# Patient Record
Sex: Male | Born: 1940
Health system: Southern US, Community
[De-identification: ages and names within clinical notes are randomized; demographics above are authoritative.]

## PROBLEM LIST (undated history)

## (undated) ENCOUNTER — Emergency Department (HOSPITAL_COMMUNITY): Discharge status: Absent without leave | Payer: PPO

## (undated) DIAGNOSIS — M2540 Effusion, unspecified joint: Secondary | ICD-10-CM

## (undated) DIAGNOSIS — G510 Bell's palsy: Secondary | ICD-10-CM

## (undated) DIAGNOSIS — R3915 Urgency of urination: Secondary | ICD-10-CM

## (undated) DIAGNOSIS — M2550 Pain in unspecified joint: Secondary | ICD-10-CM

## (undated) DIAGNOSIS — R351 Nocturia: Secondary | ICD-10-CM

## (undated) DIAGNOSIS — Z8709 Personal history of other diseases of the respiratory system: Secondary | ICD-10-CM

## (undated) DIAGNOSIS — K219 Gastro-esophageal reflux disease without esophagitis: Secondary | ICD-10-CM

## (undated) DIAGNOSIS — I739 Peripheral vascular disease, unspecified: Secondary | ICD-10-CM

## (undated) DIAGNOSIS — H269 Unspecified cataract: Secondary | ICD-10-CM

## (undated) DIAGNOSIS — F419 Anxiety disorder, unspecified: Secondary | ICD-10-CM

## (undated) DIAGNOSIS — Z860100 Personal history of colon polyps, unspecified: Secondary | ICD-10-CM

## (undated) DIAGNOSIS — C801 Malignant (primary) neoplasm, unspecified: Secondary | ICD-10-CM

## (undated) DIAGNOSIS — R0902 Hypoxemia: Secondary | ICD-10-CM

## (undated) DIAGNOSIS — I714 Abdominal aortic aneurysm, without rupture, unspecified: Secondary | ICD-10-CM

## (undated) DIAGNOSIS — I509 Heart failure, unspecified: Secondary | ICD-10-CM

## (undated) DIAGNOSIS — M1990 Unspecified osteoarthritis, unspecified site: Secondary | ICD-10-CM

## (undated) DIAGNOSIS — J449 Chronic obstructive pulmonary disease, unspecified: Secondary | ICD-10-CM

## (undated) DIAGNOSIS — R519 Headache, unspecified: Secondary | ICD-10-CM

## (undated) DIAGNOSIS — G4730 Sleep apnea, unspecified: Secondary | ICD-10-CM

## (undated) DIAGNOSIS — I10 Essential (primary) hypertension: Secondary | ICD-10-CM

## (undated) DIAGNOSIS — S76112A Strain of left quadriceps muscle, fascia and tendon, initial encounter: Secondary | ICD-10-CM

## (undated) DIAGNOSIS — Z87898 Personal history of other specified conditions: Secondary | ICD-10-CM

## (undated) DIAGNOSIS — J189 Pneumonia, unspecified organism: Secondary | ICD-10-CM

## (undated) DIAGNOSIS — N400 Enlarged prostate without lower urinary tract symptoms: Secondary | ICD-10-CM

## (undated) DIAGNOSIS — R51 Headache: Secondary | ICD-10-CM

## (undated) DIAGNOSIS — Z8719 Personal history of other diseases of the digestive system: Secondary | ICD-10-CM

## (undated) DIAGNOSIS — I499 Cardiac arrhythmia, unspecified: Secondary | ICD-10-CM

## (undated) DIAGNOSIS — G2581 Restless legs syndrome: Secondary | ICD-10-CM

## (undated) DIAGNOSIS — Z9981 Dependence on supplemental oxygen: Secondary | ICD-10-CM

## (undated) DIAGNOSIS — I480 Paroxysmal atrial fibrillation: Secondary | ICD-10-CM

## (undated) DIAGNOSIS — IMO0001 Reserved for inherently not codable concepts without codable children: Secondary | ICD-10-CM

## (undated) DIAGNOSIS — K5792 Diverticulitis of intestine, part unspecified, without perforation or abscess without bleeding: Secondary | ICD-10-CM

## (undated) HISTORY — DX: Bell's palsy: G51.0

## (undated) HISTORY — DX: Unspecified osteoarthritis, unspecified site: M19.90

## (undated) HISTORY — PX: ESOPHAGOGASTRODUODENOSCOPY: SHX1529

## (undated) HISTORY — PX: BICEPS TENDON REPAIR: SHX566

## (undated) HISTORY — PX: OTHER SURGICAL HISTORY: SHX169

## (undated) HISTORY — PX: FOOT SURGERY: SHX648

## (undated) HISTORY — PX: HERNIA REPAIR: SHX51

## (undated) HISTORY — PX: COLONOSCOPY: SHX174

## (undated) HISTORY — DX: Essential (primary) hypertension: I10

## (undated) HISTORY — DX: Abdominal aortic aneurysm, without rupture: I71.4

## (undated) HISTORY — PX: TONSILLECTOMY: SUR1361

## (undated) HISTORY — PX: CYSTOSCOPY: SUR368

## (undated) HISTORY — PX: ROTATOR CUFF REPAIR: SHX139

## (undated) HISTORY — DX: Diverticulitis of intestine, part unspecified, without perforation or abscess without bleeding: K57.92

## (undated) HISTORY — PX: HAND SURGERY: SHX662

## (undated) HISTORY — DX: Abdominal aortic aneurysm, without rupture, unspecified: I71.40

## (undated) HISTORY — DX: Gastro-esophageal reflux disease without esophagitis: K21.9

---

## 1999-02-20 ENCOUNTER — Ambulatory Visit (HOSPITAL_BASED_OUTPATIENT_CLINIC_OR_DEPARTMENT_OTHER): Admission: RE | Admit: 1999-02-20 | Discharge: 1999-02-20 | Payer: Self-pay | Admitting: Orthopedic Surgery

## 1999-05-13 ENCOUNTER — Ambulatory Visit (HOSPITAL_BASED_OUTPATIENT_CLINIC_OR_DEPARTMENT_OTHER): Admission: RE | Admit: 1999-05-13 | Discharge: 1999-05-13 | Payer: Self-pay | Admitting: Orthopedic Surgery

## 1999-07-15 ENCOUNTER — Ambulatory Visit (HOSPITAL_BASED_OUTPATIENT_CLINIC_OR_DEPARTMENT_OTHER): Admission: RE | Admit: 1999-07-15 | Discharge: 1999-07-15 | Payer: Self-pay | Admitting: Orthopedic Surgery

## 2001-03-24 HISTORY — PX: COLONOSCOPY: SHX174

## 2002-02-24 ENCOUNTER — Ambulatory Visit (HOSPITAL_COMMUNITY): Admission: RE | Admit: 2002-02-24 | Discharge: 2002-02-24 | Payer: Self-pay | Admitting: Internal Medicine

## 2003-05-29 ENCOUNTER — Ambulatory Visit: Admission: RE | Admit: 2003-05-29 | Discharge: 2003-05-29 | Payer: Self-pay | Admitting: Internal Medicine

## 2003-06-01 ENCOUNTER — Ambulatory Visit (HOSPITAL_COMMUNITY): Admission: RE | Admit: 2003-06-01 | Discharge: 2003-06-01 | Payer: Self-pay | Admitting: Internal Medicine

## 2003-06-09 ENCOUNTER — Ambulatory Visit (HOSPITAL_COMMUNITY): Admission: RE | Admit: 2003-06-09 | Discharge: 2003-06-09 | Payer: Self-pay | Admitting: General Surgery

## 2003-09-05 ENCOUNTER — Ambulatory Visit (HOSPITAL_COMMUNITY): Admission: RE | Admit: 2003-09-05 | Discharge: 2003-09-05 | Payer: Self-pay | Admitting: Internal Medicine

## 2006-12-10 ENCOUNTER — Ambulatory Visit: Payer: Self-pay | Admitting: *Deleted

## 2007-12-16 ENCOUNTER — Ambulatory Visit: Payer: Self-pay | Admitting: *Deleted

## 2008-06-29 ENCOUNTER — Inpatient Hospital Stay (HOSPITAL_COMMUNITY): Admission: EM | Admit: 2008-06-29 | Discharge: 2008-06-30 | Payer: Self-pay | Admitting: Emergency Medicine

## 2008-06-29 ENCOUNTER — Encounter (INDEPENDENT_AMBULATORY_CARE_PROVIDER_SITE_OTHER): Payer: Self-pay | Admitting: Cardiology

## 2008-06-29 ENCOUNTER — Ambulatory Visit: Payer: Self-pay | Admitting: Surgery

## 2008-09-21 ENCOUNTER — Ambulatory Visit (HOSPITAL_COMMUNITY): Admission: RE | Admit: 2008-09-21 | Discharge: 2008-09-21 | Payer: Self-pay | Admitting: Internal Medicine

## 2008-10-06 ENCOUNTER — Encounter (INDEPENDENT_AMBULATORY_CARE_PROVIDER_SITE_OTHER): Payer: Self-pay | Admitting: Urology

## 2008-10-06 ENCOUNTER — Ambulatory Visit (HOSPITAL_BASED_OUTPATIENT_CLINIC_OR_DEPARTMENT_OTHER): Admission: RE | Admit: 2008-10-06 | Discharge: 2008-10-06 | Payer: Self-pay | Admitting: Urology

## 2008-10-06 HISTORY — PX: CHOLECYSTECTOMY: SHX55

## 2009-01-15 ENCOUNTER — Ambulatory Visit: Payer: Self-pay | Admitting: Surgery

## 2009-02-26 DIAGNOSIS — Z8719 Personal history of other diseases of the digestive system: Secondary | ICD-10-CM | POA: Insufficient documentation

## 2009-02-26 DIAGNOSIS — I10 Essential (primary) hypertension: Secondary | ICD-10-CM | POA: Insufficient documentation

## 2009-02-27 ENCOUNTER — Ambulatory Visit: Payer: Self-pay | Admitting: Internal Medicine

## 2009-02-28 ENCOUNTER — Encounter: Payer: Self-pay | Admitting: Internal Medicine

## 2009-03-24 HISTORY — PX: COLONOSCOPY: SHX174

## 2009-03-26 ENCOUNTER — Ambulatory Visit (HOSPITAL_COMMUNITY): Admission: RE | Admit: 2009-03-26 | Discharge: 2009-03-26 | Payer: Self-pay | Admitting: Internal Medicine

## 2009-03-26 ENCOUNTER — Ambulatory Visit: Payer: Self-pay | Admitting: Internal Medicine

## 2009-05-31 ENCOUNTER — Encounter: Admission: RE | Admit: 2009-05-31 | Discharge: 2009-05-31 | Payer: Self-pay | Admitting: Orthopedic Surgery

## 2009-09-26 ENCOUNTER — Inpatient Hospital Stay (HOSPITAL_COMMUNITY): Admission: RE | Admit: 2009-09-26 | Discharge: 2009-09-29 | Payer: Self-pay | Admitting: Internal Medicine

## 2009-10-05 ENCOUNTER — Encounter (INDEPENDENT_AMBULATORY_CARE_PROVIDER_SITE_OTHER): Payer: Self-pay

## 2009-10-10 ENCOUNTER — Ambulatory Visit (HOSPITAL_COMMUNITY): Admission: RE | Admit: 2009-10-10 | Discharge: 2009-10-10 | Payer: Self-pay | Admitting: Internal Medicine

## 2010-02-20 ENCOUNTER — Ambulatory Visit (HOSPITAL_COMMUNITY): Admission: RE | Admit: 2010-02-20 | Discharge: 2010-02-20 | Payer: Self-pay | Admitting: Internal Medicine

## 2010-03-14 ENCOUNTER — Ambulatory Visit: Payer: Self-pay | Admitting: Vascular Surgery

## 2010-04-25 NOTE — Letter (Signed)
Summary: Recall Office Visit  [facility]  [address]   [place], [place] [address]   Phone: [phone]  Fax: [phone]      October 05, 2009   CARLISS QUAST [address] [place], [place]  [address] 20-Jan-1941   Dear Mr. Lawhead,   According to our records, it is time for you to schedule a follow-up office visit with [place].   At your convenience, please call [phone] to schedule an office visit. If you have any questions, concerns, or feel that this letter is in error, we would appreciate your call.   Sincerely,    Hendricks Limes LPN  [facility] Ph: [phone]   Fax: [phone]

## 2010-06-09 LAB — CBC
MCHC: 33.7 g/dL (ref 30.0–36.0)
RDW: 15.3 % (ref 11.5–15.5)

## 2010-06-09 LAB — DIFFERENTIAL
Eosinophils Absolute: 0.3 10*3/uL (ref 0.0–0.7)
Eosinophils Relative: 3 % (ref 0–5)
Lymphs Abs: 2.3 10*3/uL (ref 0.7–4.0)
Monocytes Absolute: 0.9 10*3/uL (ref 0.1–1.0)
Monocytes Relative: 9 % (ref 3–12)
Neutro Abs: 7.1 10*3/uL (ref 1.7–7.7)
Neutrophils Relative %: 67 % (ref 43–77)

## 2010-06-30 LAB — URINE MICROSCOPIC-ADD ON

## 2010-06-30 LAB — COMPREHENSIVE METABOLIC PANEL
ALT: 20 U/L (ref 0–53)
BUN: 12 mg/dL (ref 6–23)
CO2: 24 mEq/L (ref 19–32)
Calcium: 9.5 mg/dL (ref 8.4–10.5)
Creatinine, Ser: 1.03 mg/dL (ref 0.4–1.5)
GFR calc non Af Amer: >60 mL/min (ref >60)
Glucose, Bld: 115 mg/dL — ABNORMAL HIGH (ref 70–99)
Total Protein: 6.5 g/dL (ref 6.0–8.3)

## 2010-06-30 LAB — URINALYSIS, ROUTINE W REFLEX MICROSCOPIC
Glucose, UA: NEGATIVE mg/dL
Ketones, ur: NEGATIVE mg/dL
Leukocytes, UA: NEGATIVE
Nitrite: NEGATIVE
Protein, ur: NEGATIVE mg/dL
Urobilinogen, UA: 0.2 mg/dL (ref 0.0–1.0)

## 2010-06-30 LAB — CBC
HCT: 50.7 % (ref 39.0–52.0)
Hemoglobin: 17 g/dL (ref 13.0–17.0)
MCHC: 33.6 g/dL (ref 30.0–36.0)
MCV: 89.4 fL (ref 78.0–100.0)
RBC: 5.67 MIL/uL (ref 4.22–5.81)
RDW: 14.8 % (ref 11.5–15.5)

## 2010-06-30 LAB — PROTIME-INR
INR: 0.9 (ref 0.00–1.49)
Prothrombin Time: 12.2 seconds (ref 11.6–15.2)

## 2010-07-03 LAB — COMPREHENSIVE METABOLIC PANEL
ALT: 34 U/L (ref 0–53)
AST: 35 U/L (ref 0–37)
Albumin: 3.8 g/dL (ref 3.5–5.2)
CO2: 27 mEq/L (ref 19–32)
Calcium: 9.8 mg/dL (ref 8.4–10.5)
Chloride: 109 mEq/L (ref 96–112)
Creatinine, Ser: 1.17 mg/dL (ref 0.4–1.5)
GFR calc Af Amer: >60 mL/min (ref >60)
Sodium: 144 mEq/L (ref 135–145)
Total Bilirubin: 0.7 mg/dL (ref 0.3–1.2)

## 2010-07-03 LAB — POCT CARDIAC MARKERS: Troponin i, poc: <0.05 ng/mL (ref 0.00–0.09)

## 2010-07-03 LAB — URINALYSIS, ROUTINE W REFLEX MICROSCOPIC
Bilirubin Urine: NEGATIVE
Leukocytes, UA: NEGATIVE
Nitrite: NEGATIVE
Specific Gravity, Urine: 1.022 (ref 1.005–1.030)
pH: 5.5 (ref 5.0–8.0)

## 2010-07-03 LAB — TROPONIN I: Troponin I: <0.01 ng/mL (ref 0.00–0.06)

## 2010-07-03 LAB — BASIC METABOLIC PANEL
BUN: 13 mg/dL (ref 6–23)
CO2: 25 mEq/L (ref 19–32)
Chloride: 114 mEq/L — ABNORMAL HIGH (ref 96–112)
Creatinine, Ser: 1.06 mg/dL (ref 0.4–1.5)
GFR calc Af Amer: >60 mL/min (ref >60)
Glucose, Bld: 130 mg/dL — ABNORMAL HIGH (ref 70–99)

## 2010-07-03 LAB — CBC
MCHC: 34.1 g/dL (ref 30.0–36.0)
MCV: 88.9 fL (ref 78.0–100.0)
MCV: 89.6 fL (ref 78.0–100.0)
Platelets: 259 10*3/uL (ref 150–400)
RBC: 4.91 MIL/uL (ref 4.22–5.81)
RBC: 5.54 MIL/uL (ref 4.22–5.81)
RDW: 15.3 % (ref 11.5–15.5)
WBC: 16.8 10*3/uL — ABNORMAL HIGH (ref 4.0–10.5)

## 2010-07-03 LAB — CK TOTAL AND CKMB (NOT AT ARMC)
CK, MB: 4.4 ng/mL — ABNORMAL HIGH (ref 0.3–4.0)
Relative Index: 1.1 (ref 0.0–2.5)
Total CK: 409 U/L — ABNORMAL HIGH (ref 7–232)

## 2010-07-03 LAB — DIFFERENTIAL
Eosinophils Absolute: 0 10*3/uL (ref 0.0–0.7)
Eosinophils Relative: 0 % (ref 0–5)
Lymphocytes Relative: 12 % (ref 12–46)
Lymphs Abs: 1.9 10*3/uL (ref 0.7–4.0)
Monocytes Absolute: 0.8 10*3/uL (ref 0.1–1.0)

## 2010-07-03 LAB — URINE CULTURE

## 2010-07-03 LAB — LIPID PANEL
Cholesterol: 153 mg/dL (ref 0–200)
LDL Cholesterol: 99 mg/dL (ref 0–99)
Triglycerides: 123 mg/dL (ref <150)

## 2010-07-03 LAB — TSH: TSH: 0.727 u[IU]/mL (ref 0.350–4.500)

## 2010-07-03 LAB — CARDIAC PANEL(CRET KIN+CKTOT+MB+TROPI)
CK, MB: 3.2 ng/mL (ref 0.3–4.0)
Relative Index: 0.7 (ref 0.0–2.5)

## 2010-07-03 LAB — URINE MICROSCOPIC-ADD ON

## 2010-07-03 LAB — CALCIUM: Calcium: 9.2 mg/dL (ref 8.4–10.5)

## 2010-07-03 LAB — D-DIMER, QUANTITATIVE: D-Dimer, Quant: 7.37 ug/mL-FEU — ABNORMAL HIGH (ref 0.00–0.48)

## 2010-08-06 NOTE — Procedures (Signed)
DUPLEX ULTRASOUND OF ABDOMINAL AORTA   INDICATION:  Followup from abdominal aortic aneurysm.   HISTORY:  Diabetes:  No  Cardiac:  No  Hypertension:  Yes  Smoking:  Yes  Family History:  Father  AAA  Previous Surgery:  No   DUPLEX EXAM:         AP (cm)                   TRANSVERSE (cm)  Proximal             2.2 cm                    2.4 cm  Mid                  2.3 cm                    2.4 cm  Distal               1.3 cm                    1.9 cm  Right Iliac          1.2 cm                    1.9 cm  Left Iliac           1.3 cm                    1.4 cm   PREVIOUS:  Date:  12/16/2007  AP:  2.9  TRANSVERSE:  2.3   IMPRESSION:  Abdominal aortic aneurysm, largest measurement of 2.3 x  2.4.   ___________________________________________  V. Charlena Cross, MD   CJ/MEDQ  D:  01/15/2009  T:  01/15/2009  Job:  [id number]

## 2010-08-06 NOTE — Procedures (Signed)
DUPLEX ULTRASOUND OF ABDOMINAL AORTA   INDICATION:  Followup abdominal aortic aneurysm.   HISTORY:  Diabetes:  No.  Cardiac:  No.  Hypertension:  Yes.  Smoking:  Yes.  Connective Tissue Disorder:  Family History:  Father had AAA.  Previous Surgery:  No.   DUPLEX EXAM:         AP (cm)                   TRANSVERSE (cm)  Proximal             2.9 cm                    2.9 cm  Mid                  2.9 cm                    3.0 cm  Distal               2.0 cm                    2.1 cm  Right Iliac          1.2 cm                    1.2 cm  Left Iliac           1.2 cm                    1.1 cm   PREVIOUS:  Date:  12/10/2006  AP:  2.38  TRANSVERSE:  2.3   IMPRESSION:  1. Aneurysm of the mid abdominal aorta with evidence of a possible      dissection noted.  2. Mildly increased diameter measurements noted when compared to the      previous exam of 12/10/2006, however, the aneurysm remains minimal      in size.  Otherwise there is no significant change noted from the      previous exam.   ___________________________________________  P. Liliane Bade, M.D.   CH/MEDQ  D:  12/16/2007  T:  12/16/2007  Job:  [id number]

## 2010-08-06 NOTE — Op Note (Signed)
Derrick Moon, Derrick Moon                ACCOUNT NO.:  [id number]   MEDICAL RECORD NO.:  [id number]          PATIENT TYPE:  AMB   LOCATION:  [facility]                         FACILITY:  [facility]   PHYSICIAN:  Ronald L. Earlene Plater, M.D.  DATE OF BIRTH:  [birth year]   DATE OF PROCEDURE:  10/06/2008  DATE OF DISCHARGE:                               OPERATIVE REPORT   PREOPERATIVE DIAGNOSIS:  Bladder tumor.   POSTOPERATIVE DIAGNOSIS:  Bladder tumor.   PROCEDURE PERFORMED:  1. Cystourethroscopy.  2. Transurethral resection of bladder tumor (medium).   SURGEON:  Lucrezia Starch. Earlene Plater, M.D.   RESIDENTRonaldo Miyamoto A. Peggye Pitt, M.D.   ANESTHESIA:  General endotracheal anesthesia.   COMPLICATIONS:  None.   SPECIMENS:  Bladder tumor sent to pathology.   FINDINGS:  1. The patient had a papillary bladder tumor on the left lateral      bladder wall approximately 2 to 3 cm in size.  2. The patient had a high bladder neck with prominent median lobe.   INDICATIONS FOR PROCEDURE:  Mr. Derrick Moon is a 70 year old Caucasian male  who developed gross painless hematuria.  Of note, he is a smoker and  smokes cigars.  He was evaluated with CT scan and office cystoscopy  which revealed a papillary bladder tumor.  He now presents to the  operating room for definitive management and understands all the risk as  informed consent has been obtained.   DESCRIPTION OF PROCEDURE IN DETAIL:  Mr. Derrick Moon was brought back to the  operating room and correctly identified via his wristband.  Preoperative  time out was called to confirm correct patient, procedure and site.  After successful induction of general endotracheal anesthesia, he was  positioned in the dorsal lithotomy position and to reduce pressure on  bony prominences.  IV antibiotics were administered.  His perineum was  prepped and draped in usual sterile fashion.  Surgeon was sterilely  gowned and gloved.   A 22-French cystoscopic sheath was used to insert a 12-degree lens  transurethrally into the bladder, and pan cystoscopy was performed.  The  anterior urethra was normal in appearance.  The posterior urethra showed  evidence of a prominent median lobe and a high-riding bladder neck.  Cystoscopy was performed which revealed normal shape, size and capacity  of the bladder.  There was no evidence of any diverticula.  There was 1+  trabeculation of the bladder.  Bilateral ureteral orifices were noted in  their usual anatomical position and seen to be effluxing clear yellow  urine.  There was some stony debris present within the bladder, and a 2-  3 cm papillary bladder tumor was identified in the left lateral bladder  wall.  Cystoscopy was performed with a 70-degree lens, and there were no  other lesions identified.   Resectoscope was inserted using the obturator.  We then systematically  resected a bladder tumor using continuous flow resectoscope.  The  patient was paralyzed for this portion procedure to prevent possible  obturator reflex.  The tumor was resected down to the muscularis  propria.  Base of the tumor was  then fulgurated as were the wound edges.  It was excellently hemostatic at the completion.  The bladder tumor  chips were then retrieved and sent to permanent pathology.   The patient's bladder was then emptied, and resectoscope was removed.  A  22-French Foley catheter was attempted to be inserted transurethrally.  However, resistance was met at the posterior urethra.  This was  attempted using a catheter guide.  However, this was also unsuccessful.  Therefore, we elected to place the catheter cystoscopically.  Cystoscope  was then reintroduced, and there was no trauma to the anterior or  posterior urethra.  The difficulty was likely related to high bladder  neck.  With the scope in place, a wire was inserted through the working  port up into the bladder.  The wire held in place and the scope was  removed.  A 20-French Council tip catheter  was then advanced over the  Glidewire with return of clear yellow urine.  The balloon was inflated  with 10 mL of sterile water.  It was connected to a drainage bag, and  the procedure was terminated.  The patient tolerated the procedure well  without any undue side effects.   Dr. Earlene Plater was present and available and participated in all aspects of  this patient's care.   DISPOSITION:  The patient tolerated the procedure well and was extubated  and transported back in stable condition.     ______________________________  Edward Qualia, M.D.      Ronald L. Earlene Plater, M.D.  Electronically Signed    KR/MEDQ  D:  10/06/2008  T:  10/07/2008  Job:  [id number]

## 2010-08-06 NOTE — Procedures (Signed)
DUPLEX ULTRASOUND OF ABDOMINAL AORTA   INDICATION:  Follow-up abdominal aortic aneurysm.   HISTORY:  Diabetes:  No.  Cardiac:  No.  Hypertension:  Yes.  Smoking:  Yes.  Connective Tissue Disorder:  Family History:  Yes, father.  Previous Surgery:  No.   DUPLEX EXAM:         AP (cm)                   TRANSVERSE (cm)  Proximal             1.60 Cm                   1.75 cm  Mid                  2.38 cm                   2.33 cm  Distal               1.64 cm                   1.85 cm  Right Iliac          0.83 cm                   0.80 cm  Left Iliac           0.83 cm                   0.8 cm   PREVIOUS:  Date: 11/06/05  AP:  2.3  TRANSVERSE:  2.3   IMPRESSION:  Abdominal aortic aneurysm appears to have stable  measurements, however, evidence of possible dissection.   ___________________________________________  P. Liliane Bade, M.D.   AS/MEDQ  D:  12/10/2006  T:  12/10/2006  Job:  [id number]
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
## 2010-08-06 NOTE — Procedures (Signed)
DUPLEX ULTRASOUND OF ABDOMINAL AORTA   INDICATION:  Follow up abdominal aortic aneurysm.   HISTORY:  Diabetes:  No.  Cardiac:  No.  Hypertension:  Yes.  Smoking:  Yes.  Connective Tissue Disorder:  Family History:  Father had a AAA.  Previous Surgery:  No.   DUPLEX EXAM:         AP (cm)                   TRANSVERSE (cm)  Proximal             1.8 cm                    2.1 cm  Mid                  2.4 cm                    2.8 cm  Distal               1.6 cm                    1.9 cm  Right Iliac          1.1 cm                    1.4 cm  Left Iliac           1.0 cm                    1.5 cm   PREVIOUS:  Date:  AP:  2.3  TRANSVERSE:  2.4   IMPRESSION:  An ectatic abdominal aortic aneurysm within the mid distal  aorta.  The largest measurement today of 2.8 X 2.4 cm.  Correlates to  exams previously.  Echogenic foci within the mid aorta, measuring 1.5  cm, suggestive of atherosclerosis versus intimal flap with a possible  dissection, as discussed in previous examinations.  A technically  difficult study due to overlying bowel gas and patient body habitus.  The study does appear stable compared to previous.   ___________________________________________  Janetta Hora Fields, MD   OD/MEDQ  D:  03/14/2010  T:  03/14/2010  Job:  [id number]

## 2010-08-06 NOTE — Assessment & Plan Note (Signed)
OFFICE VISIT   Derrick Moon, Derrick Moon  DOB:  09-28-1940                                       03/14/2010  [id number]#:[id number]   The patient is a 70 year old male who we have been following for small  abdominal aortic aneurysm.  He was originally seen by Dr. Madilyn Fireman in  August of 2005.  At that time his aneurysm was 3.2 x 2.8 cm in diameter.  He had a repeat ultrasound today which shows that the aneurysm is 2.8 x  2.4 cm in diameter.  This represents essentially no growth over 6 years.  The patient denies any abdominal or back pain.  His father did have a  family history of aneurysm.   CHRONIC MEDICAL PROBLEMS:  Include hypertension which is currently  controlled and followed by Dr. Ouida Sills.  He recently had an operation on  his right foot by Dr. Lestine Box.   SOCIAL HISTORY:  He is married, has four children.  He is retired.  He  smokes 2 cigars per day.   FAMILY HISTORY:  As mentioned above.   REVIEW OF SYSTEMS:  He has a history of hiatal hernia and urinary  frequency.  NEUROLOGIC:  He has occasional dizziness and headaches.  EYES:  He has had some decline in his eyesight recently.  MUSCULOSKELETAL:  He has joint and arthritis pain.  All other systems were negative.   PHYSICAL EXAM:  Blood pressure is 124/74 in the left arm, heart rate 61  and regular.  Temperature is 98.  HEENT:  Unremarkable.  Neck:  Has 2+  carotid pulses without bruit.  Chest:  Clear to auscultation.  Cardiac:  Regular rate and rhythm without murmur.  Abdomen:  Obese, soft,  nontender, nondistended, no palpable mass.  Extremities:  He has 2+  femoral pulses bilaterally.  He has a cast extending from below the knee  on the right side down surrounding the entire right foot.  He has 2+  dorsalis pedis and posterior tibial pulse in the left foot, it is pink,  warm and well-perfused.  Skin has no open ulcers or rashes otherwise.   In summary, the patient had an ultrasound today which shows  maximal  aortic diameter of 2.8 cm in diameter.  This is unchanged over the last  6 years.  I believe it would be okay to space his ultrasounds out at  this point.  He will follow up with a AAA ultrasound in 2 years' time.  If it again is unchanged at that time we may need to consider whether or  not to space his followup out further.     Janetta Hora. Fields, MD  Electronically Signed   CEF/MEDQ  D:  03/14/2010  T:  03/15/2010  Job:  [id number]   cc:   Kingsley Callander. Ouida Sills, MD

## 2010-08-06 NOTE — Procedures (Signed)
EEG NUMBER:  [id number]   CLINICAL HISTORY:  A 70 year old man with episode of syncope versus  seizure versus pathologic sleepiness.  EEG is for evaluation.   DESCRIPTION:  Background waking rhythm in this tracing is seen only very  brief fragments and is a very low amplitude.  Alpha rhythm of  approximately 10 Hz, which appears in posterior regions.  The record  consists almost entirely of low-amplitude fast activity of 15-25 Hz,  which appears diffusely without abnormal asymmetry.  No focal slowing is  noted and no epileptiform discharge is seen.  The patient remained in  awake state throughout the recording.  Photic stimulation and  hyperventilation were both performed, and neither produced significant  change in the background rhythms, although a little bit of increased  prominence in the alpha was seen in hyperventilation.  Some mild driving  responses and photomyoclonic responses were seen with photic  stimulation.  Single-channel devoted EKG revealed sinus rhythm  throughout at a rate of approximately 72 beats per minute.   CONCLUSION:  Essentially normal study in awake state.  Excessive low-  amplitude beta activity as seen in the study is a common finding in  anxious individuals and in those taking medications of the  sedative/hypnotic class.  It may be useful to repeat EEG off medications  or when the patient is less anxious if felt to be clinically indicated.  However, no focal slowing is noted and no epileptiform discharge is  seen.      Michael L. Thad Ranger, M.D.  Electronically Signed     ZOX:WRUE  D:  06/30/2008 17:03:56  T:  07/01/2008 07:39:26  Job #:  [id number]   cc:   [facility]
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
## 2010-08-09 NOTE — Op Note (Signed)
[place]. [facility]  Patient:    Derrick Moon, Derrick Moon                         MRN: [id number] Proc. Date: 05/13/99 Attending:  Alinda Deem, M.D.                           Operative Report  PREOPERATIVE DIAGNOSIS:  Right shoulder impingement syndrome, possible rotator uff tear.  POSTOPERATIVE DIAGNOSIS:  Right shoulder impingement with passive rotator cuff tear.  PROCEDURE:  Right shoulder arthroscopic anteroinferior acromioplasty, debridement of massive rotator cuff tear that went all the way back to the musculotendinous  junction of the supraspinatus, and greater tuberoplasty.  SURGEON:  Alinda Deem, M.D.  FIRST ASSISTANT:  None.  ANESTHESIA:  General endotracheal anesthesia.  ESTIMATED BLOOD LOSS:  Minimal.  FLUID REPLACEMENT:  1200 cc of crystalloid.  DRAINS PLACED:  None.  TOURNIQUET TIME:  None.  INDICATIONS FOR PROCEDURE:  [age]-year-old man, followed for a right shoulder impingement syndrome for over a year, who has failed conservative treatment with anti-inflammatory medicines, Theraband exercises and only got temporary relief rom cortisone injections.  He does have some rotator cuff weakness and may in fact ave a rotator cuff tear, but at age 75 and a smoker, if there is a massive tear as there would be in a younger person, no repair will be attempted.  We will simply debride him, accept weakness in exchange for pain relief; he is already weak.  DESCRIPTION OF PROCEDURE:  The patient was identified by arm band and taken to he operating room at [facility], appropriate anesthetic monitors were  attached and general endotracheal anesthesia induced, with the patient in a supine position.  He was then placed in a beach chair position and the right upper extremity prepped and draped in the usual sterile fashion from the wrist to the  hemithorax.  The skin along the anterolateral and posterior aspects of  the acromion process was infiltrated with 2 to 3 cc of 0.5% Marcaine and epinephrine solution and using a #11 blade, standard anterolateral and posterior portals were then made 1.5 cm from the anterior aspect of the [facility] joint, lateral to the junction of the  middle and posterior thirds of the acromion and posterior to the posterolateral  corner of the acromion process.  The inflow was introduced anteriorly, the arthroscope laterally and a 4.2-mm ______  sucker-shaver posteriorly.  We immediately identified a massive rotator cuff tear all the way to the musculotendinous junction of the supraspinatus tendon that looked to be old and  chronic and this was debrided back to a stable margin and an accessory anterolateral portal was made and it could not be reduced anywhere near the greater tuberosity and therefore no repair was attempted; we simply smoothed off the edges and smoothed off the greater tuberosity as well, removing bits of torn tendon from that as well.  The subacromial spur was then removed a 4.5-mm hooded vortex bur. The [facility] joint was noted to be intact and this was not disrupted.  We removed subacromial bursa that was inflamed, took the shoulder through a range of motion and noticed good condition of the articular cartilage, the subscapularis tendon, the biceps tendon and the infraspinatus tendons.  Satisfied with our decompression and debridement, the arthroscopic instruments were removed and a dressing of Xeroform, 4 x 8 dressing sponges and Hypafix tape were applied.  The patient was then awakened and taken to the recovery room without difficulty. DD:  05/13/99 TD:  05/13/99 Job: [id number]

## 2010-08-09 NOTE — Op Note (Signed)
NAMEGRANTHAM, Derrick Moon                          ACCOUNT NO.:  [id number]   MEDICAL RECORD NO.:  [id number]                   PATIENT TYPE:  AMB   LOCATION:  DAY                                  FACILITY:  [facility]   PHYSICIAN:  Dalia Heading, M.D.               DATE OF BIRTH:  1940/04/29   DATE OF PROCEDURE:  06/09/2003  DATE OF DISCHARGE:                                 OPERATIVE REPORT   PREOPERATIVE DIAGNOSIS:  Biliary colic.   POSTOPERATIVE DIAGNOSIS:  Biliary colic, chronic cholecystitis.   PROCEDURE:  Laparoscopic cholecystectomy.   SURGEON:  Dalia Heading, M.D.   ASSISTANT:  Bernerd Limbo. Leona Carry, M.D.   ANESTHESIA:  General endotracheal.   INDICATIONS FOR PROCEDURE:  The patient is a 70 year old white male who  presents with biliary colic secondary to chronic cholecystitis.  The risks  and benefits of the procedure, including bleeding, infection, hepatobiliary  injury, and the possibility of an open procedure were fully explained to the  patient who gave informed consent.   DESCRIPTION OF PROCEDURE:  The patient was placed in the supine position.  After induction of general endotracheal anesthesia, the abdomen was prepped  and draped using the usual sterile technique with Betadine.  Surgical site  confirmation was performed.   An infraumbilical incision was made down to the fascia.  A Veress needle was  introduced into the abdominal cavity, and confirmation of placement was done  using the saline drop test.  The abdomen was then insufflated to 16 mmHg  pressure.  An 11-mm trocar was introduced into the abdominal cavity under  direct visualization without difficulty.  The patient was placed in reverse  Trendelenburg position, and an additional 11-mm trocar was placed in the  epigastric region and 5-mm trocars were placed in the right upper quadrant  and right flank regions.  The liver was inspected and noted to be for the  most part within normal limits.  A mild fatty  liver was found.  The  gallbladder was retracted superiorly and laterally.  The dissection was  begun around the infundibulum of the gallbladder.  The cystic duct was first  identified.  It was noted to have a high insertion proximal to the  infundibulum.  This was causing a kinking of the neck of the gallbladder.  The cystic duct was ligated and divided.  This was likewise done on the  cystic artery.  The gallbladder was then freed away from the gallbladder  fossa using Bovie electrocautery.  The gallbladder was delivered through the  epigastric trocar site without difficulty.   The gallbladder fossa was inspected, and no abnormal bleeding or bile  leakage was noted.  Surgicel was placed in the gallbladder fossa.  All fluid  and air were then evacuated from the abdominal cavity prior to removal of  the trocars.   All wounds were irrigated with normal saline.  All wounds were  injected with  0.5% Sensorcaine.  The infraumbilical fascia was reapproximated using an 0  Vicryl interrupted suture.  All skin incisions were closed using staples.  Betadine ointment and dry sterile dressings were applied.   All tape and needle counts were correct at the end of the procedure.  The  patient was extubated in the operating room and went back to the recovery  room awake and in stable condition.   COMPLICATIONS:  None.   SPECIMENS:  Gallbladder.   ESTIMATED BLOOD LOSS:  Minimal.      ___________________________________________                                            Dalia Heading, M.D.   MAJ/MEDQ  D:  06/09/2003  T:  06/09/2003  Job:  [id number]   cc:   Kingsley Callander. Ouida Sills, M.D.  [address]  [place]  [place] [address]  Fax: [phone]

## 2010-08-09 NOTE — Op Note (Signed)
[place]. [facility]  Patient:    Derrick Moon                        MRN: [id number] Proc. Date: 02/20/99 Adm. Date:  [id number] Attending:  Alinda Deem                           Operative Report  PREOPERATIVE DIAGNOSIS:  Loose right great toe total joint implant.  POSTOPERATIVE DIAGNOSIS:  Loose right great toe total joint implant.  PROCEDURE:  Removal of right great toe implant from the metatarsal head and the  proximal phalanx, followed by arthrodesis using autograft bone chips mixed with  allograft bone chips, fixation accomplished with crossed 3.5 mm cortical lag screws.  SURGEON:  Alinda Deem, M.D.  ASSISTANT:  Dorthula Matas, P.A.-C.  ANESTHESIA:  Ankle block anesthesia with IV sedation.  ESTIMATED BLOOD LOSS:  100 cc.  FLUID REPLACEMENT:  1200 cc of crystalloid.  TOURNIQUET TIME:  58 minutes.  INDICATIONS:  A 70 year old gentleman, who had a right great toe total joint implant MTP joint placed some years ago with obvious loosening when I met him two years ago on plain radiographs with pain.  He has put up with the pain for an extended period of time.  Radiographs shows obvious loosening and he desires open removal of the implant.  He has never had any trouble with the wound, but does report limping secondary to the pain.  The plan was to set him up for removal of the implant, possible autograft bone harvesting from the iliac crest versus using autograft chips from the site, since it was a hypertrophic loosening of the implants.  DESCRIPTION OF PROCEDURE:  The patient identified by armband, taken to the operating room at [facility], appropriate anesthetic monitors were  attached and ankle block anesthesia with IV sedation was induced.  An ankle tourniquet was placed on the right side and right foot prepped and draped in usual sterile fashion from the toes to the tourniquet.  The foot was then  wrapped with an Esmarch bandage, tourniquet inflated to 300 mmHg and we began the procedure by utilizing the old dorsomedial incision from mid metatarsal down to the tip of the proximal phalanx distally.  Small bleeders in the skin and subcutaneous tissue ere identified and cauterized.  We cut down to the bone along the skin incision and  reflected the periosteum superiorly, as well as, medially and inferiorly.  This  exposed the loose implants and after extensive dissection and removal of hypertrophic bone from around the loose implants, we were able to extract the metatarsal head implant, as well as, the proximal phalanx button.  We then removed reactive membrane from the head of the metatarsal and the proximal phalanx and trimmed hypertrophic bone from around the edges.  After we had gotten back down to raw, bleeding bone, we were able to reduce the MPP joint remnant in about 25 degrees of dorsiflexion.  Although, there was a 1/4 inch of shortening, but this had been anticipated.  There was essential cavitary defect in the proximal phalanx that we were able to fill with the autograft bone chips, mixed with a small amount of allograft bone chips.  Holding the proximal phalanx reduced to the great toe in 25 degrees of dorsiflexion we then obtained good, firm fixation using crossed 3.5 mm lag screws with the heads proximal/medial  and distal/medial.  With the placement of the second screw, no motion was noted at the fusion site and good compression noted across the fusion site.  At this point, the tourniquet was let down, small bleeders were identified and cauterized.  The wound is thoroughly irrigated with normal saline solution.  The patient did receive intraoperative Ancef antibiotics.  The pseudocapsule was closed with running 3-0 Vicryl suture and the skin with running interlocking 3-0 nylon suture.  Dressing of Xeroform, 4 x  dressing sponges, Webril and Ace wrap and a hard  shoe was then applied.  The patient was then awakened and taken to recovery room without difficulty. DD:  02/20/99 TD:  02/21/99 Job: [id number]

## 2010-08-09 NOTE — H&P (Signed)
[name], [name]                            ACCOUNT NO.:  [id number]   MEDICAL RECORD NO.:  [id number]                  PATIENT TYPE:   LOCATION:                                       FACILITY:   PHYSICIAN:  Dalia Heading, M.D.               DATE OF BIRTH:   DATE OF ADMISSION:  06/09/2003  DATE OF DISCHARGE:                                HISTORY & PHYSICAL   CHIEF COMPLAINT:  Biliary colic.   HISTORY OF PRESENT ILLNESS:  The patient is a 70 year old white male who is  referred for evaluation and treatment of right upper quadrant abdominal  pain.  It has been present for several weeks and recently does not seem to  be as severe, though he had a severe episode of biliary colic several days  ago.  He does have intermittent bloating and nausea.  No emesis has been  noted.  No fatty food intolerance is noted.  No fever, chills, or jaundice  have been noted.  His hepatobiliary scan showed a normal ejection fraction  but he had worsening symptoms.   PAST MEDICAL HISTORY:  Includes hypertension.   PAST SURGICAL HISTORY:  Herniorrhaphies, foot and shoulder surgeries, wrist  and arm surgeries.   CURRENT MEDICATIONS:  1. Prevacid 30 mg p.o. daily.  2. Hydrochlorothiazide 25 mg p.o. daily.   ALLERGIES:  SULFA.   REVIEW OF SYSTEMS:  The patient occasionally smokes and drinks alcohol.  Denies any other cardiopulmonary difficulties or bleeding disorders.   PHYSICAL EXAMINATION:  GENERAL:  The patient is a well-developed, well-  nourished white male in no acute distress.  VITAL SIGNS:  He is afebrile and vital signs are stable.  HEENT:  Reveals no scleral icterus.  LUNGS:  Clear to auscultation with equal breath sounds bilaterally.  HEART:  Reveals a regular rate and rhythm without S3, S4, or murmurs.  ABDOMEN:  Soft with slight tenderness now in the right upper quadrant to  palpation.  No hepatosplenomegaly, mass, or hernias are noted.   Ultrasound of the gallbladder is normal.   Hepatobiliary scan is as noted.   IMPRESSION:  Biliary colic.   PLAN:  The patient is scheduled for a laparoscopic cholecystectomy on June 09, 2003.  The risks and benefits of the procedure including bleeding,  infection, hepatobiliary injury, and the possibility of an open procedure  were fully explained to the patient, who gave informed consent.     ___________________________________________                                         Dalia Heading, M.D.   MAJ/MEDQ  D:  06/07/2003  T:  06/07/2003  Job:  [id number]   cc:   Kingsley Callander. Ouida Sills, M.D.  [address]  [place]  [place] [address]  Fax:  [phone] 
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
## 2010-08-09 NOTE — Op Note (Signed)
[name], [name]                          ACCOUNT NO.:  [id number]   MEDICAL RECORD NO.:  [id number]                   PATIENT TYPE:  AMB   LOCATION:  DAY                                  FACILITY:  [facility]   PHYSICIAN:  R. Roetta Sessions, M.D.              DATE OF BIRTH:  08-22-1940   DATE OF PROCEDURE:  02/24/2002  DATE OF DISCHARGE:                                 OPERATIVE REPORT   PROCEDURE:  Colonoscopy with snare polypectomy and cold biopsy.   INDICATION FOR PROCEDURE:  The patient is a 70 year old gentleman with  recurrent bouts of migratory abdominal pain for the past one year.  CT one  year ago reportedly demonstrated diverticulitis.  His dietary habits are  poor.  Colonoscopy is now being done in part to evaluate his abdominal pain  and abnormal CT and in part for colorectal cancer screening.  The patient  has been discussed with the patient previously.  The potential risks,  benefits, and alternatives have been reviewed and questions answered, and he  is agreeable.  Please see my 02/09/02 consultation noted.   DESCRIPTION OF PROCEDURE:  O2 saturation, blood pressure, pulse, and  respirations were monitored throughout the entire procedure.   Conscious sedation:  Versed 3 mg IV, Demerol 75 mg IV in divided doses.   Instrument:  Olympus video chip adult colonoscope.   Findings:  Digital rectal exam revealed no abnormalities.   Endoscopic findings.  The prep was good.   Rectum:  Examination of the rectal mucosa, including retroflexed view of the  anal verge, revealed no abnormalities.   Colon:  The colonic mucosa was surveyed from the rectosigmoid junction  through the left, transverse, and right colon, to the area of the  appendiceal orifice, ileocecal valve, and cecum.  These structures were well-  seen and photographed for the record.  The patient was noted to have left-  sided diverticula.  There were three 3 mm polyps at 30 cm, which were cold  biopsied.   There was a 5 mm polyp on a stalk, which was removed with snare  cautery.  There were two 2 mm polyps at 40 cm, which were cold  biopsied/removed.  Aside from left-sided diverticula, the remainder of  colonic mucosa otherwise appeared normal to the cecum.  The terminal ileum  was easily intubated to 15 cm.  This segment of the GI tract also appeared  normal.  From this level the scope was slowly withdrawn and all previously-  mentioned mucosal surfaces were again seen, and again no abnormalities were  observed.  The patient tolerated the procedure well, was reactivated in  endoscopy.   IMPRESSION:  1. Normal rectum.  2. Polyps at 30 cm, either cold biopsied or snared as described above.  3. Other additional polyps at 40 cm, cold biopsied.  4. Left-sided diverticula.  5. The remainder of the colonic mucosa appeared normal.  RECOMMENDATIONS:  1. No aspirin or arthritis medications for 10 days.  2. Diverticulosis literature provided to the patient.  3. Fiber supplement, Benefiber one tablespoon orally b.i.d.  4. Further recommendations to follow.                                               Derrick Moon, M.D.    RMR/MEDQ  D:  02/24/2002  T:  02/24/2002  Job:  [id number]   cc:   Kingsley Callander. Ouida Sills, M.D.  [address]  [place]  [place] [address]  Fax: [phone]

## 2010-08-09 NOTE — Op Note (Signed)
Derrick Moon. [facility]  Patient:    Moon, Derrick                      MRN: [id number] Proc. Date: 07/15/99 Adm. Date:  [id number] Attending:  Alinda Deem                           Operative Report  PREOPERATIVE DIAGNOSIS:  Chronic left biceps tendon insertion tear.  POSTOPERATIVE DIAGNOSIS:  Chronic left biceps tendon insertion tear.  PROCEDURE:  Open repair of left biceps tendon tear.  SURGEON:  Feliberto Gottron. Turner Daniels, M.D.  FIRST ASSISTANT:  Skip Mayer, P.A.-C.  ANESTHETIC:  General endotracheal.  ESTIMATED BLOOD LOSS:  Minimal  REPLACEMENTS:  1400 cc of crystalloids.  DRAINS PLACED:  None.  TOURNIQUET TIME:  56 minutes.  INDICATIONS FOR PROCEDURE:  A 70 year old man who tore his left biceps tendon last year has failed conservative treatment.  MRI scan has shown a proven tear n the biceps tendon.  Red out is 3 cm proximal to the insertion and he has pretty decent supination, but he has pain with certain lifting or turning activities with the left upper extremity.  In any event he desires elective repair of the biceps tendon and this will be accomplished.  DESCRIPTION OF PROCEDURE:  The patient identified by arm band, taken to the operating room at [facility].  Appropriate anesthetic monitor attached and general endotracheal anesthesia induced with the patient in the supine position.  Tourniquet was applied high to the left upper extremity and the left upper extremity prepped and draped in the usual sterile fashion from the fingertips to the tourniquet.  The limb was wrapped with Esmarch bandage, tourniquet inflated to 300 mmHg and we began the procedure by making anterolateral approach to the elbow starting 3-4 cm proximal to the elbow flexion crease and just lateral to the biceps muscle.  As we crossed the elbow crease we turned medial for an S shaped incision and then went down longitudinally between the mobile wad of 3 and  the flexor muscle mass.  Small bleeders and skin and subcutaneous tissue were identified and cauterized as we the cephalic veins and smaller veins.  The cephalic vein was maintained throughout the procedure and protected.  We cut down to the fascia over the forearm and identified the interval between the biceps and brachialis and the mobile wad of 3.  We exploited this interval and traced the biceps tendon down towards its insertion but about 3 cm proximal to this it became a ball of scar tissue and was no longer positively identifiable as a tendon.  The insertion of the biceps tendon was simply scar tissue and moving the biceps tendon caused no supination power to the radial head.  We found the leash of Henry.  These small vessels were cauterized and we immobilized the tendon and scar tissues and went down to the biceps insertion and cut the remnants off the insertion.  About 3-3/12 cm of tendon was nothing but scar tissue and this was eventually removed later.  We identified the bicipital tuberosity and holding the radius in full supination we were able to place 3, 3.5 mm Statak anchors in the bone obtaining good, firm fixation. These were then taken through the tendon with a Bunnell type suture with sequential tightening reducing the tendon back down to the bicipital tuberosity obtaining good, firm fixation.  All three, 2  Ethibond sutures were used to obtain fixation of the tendon to the bicipital tuberosity. Once this was accomplished moving the tendon once again supinated the forearm.  At this point the tourniquet was let down, small bleeders identified and cauterized.  The wound was irrigated with normal saline solution and then the subcutaneous tissue closed with 2-0 undyed Vicryl suture and the skin with running interlocking 3-0 nylon suture.  Dressing with xeroform, 4 x dressing sponges, Webril and an Ace wrap was applied.  The patient was then put in a left upper extremity  sling, awakened and taken to the recovery room without difficulty. DD:  07/15/99 TD:  07/15/99 Job: [id number]
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
## 2012-02-13 ENCOUNTER — Encounter: Payer: Self-pay | Admitting: Neurosurgery

## 2012-02-18 ENCOUNTER — Other Ambulatory Visit: Payer: Self-pay | Admitting: *Deleted

## 2012-02-18 DIAGNOSIS — I6529 Occlusion and stenosis of unspecified carotid artery: Secondary | ICD-10-CM

## 2012-02-25 ENCOUNTER — Encounter: Payer: Self-pay | Admitting: Neurosurgery

## 2012-02-26 ENCOUNTER — Encounter: Payer: Self-pay | Admitting: Neurosurgery

## 2012-02-26 ENCOUNTER — Ambulatory Visit (INDEPENDENT_AMBULATORY_CARE_PROVIDER_SITE_OTHER): Payer: Medicare Other | Admitting: Neurosurgery

## 2012-02-26 ENCOUNTER — Ambulatory Visit (INDEPENDENT_AMBULATORY_CARE_PROVIDER_SITE_OTHER): Payer: Medicare Other | Admitting: *Deleted

## 2012-02-26 VITALS — BP 145/84 | HR 57 | Resp 14 | Ht 71.0 in | Wt 195.4 lb

## 2012-02-26 DIAGNOSIS — I714 Abdominal aortic aneurysm, without rupture, unspecified: Secondary | ICD-10-CM

## 2012-02-26 DIAGNOSIS — I77811 Abdominal aortic ectasia: Secondary | ICD-10-CM | POA: Insufficient documentation

## 2012-02-26 NOTE — Addendum Note (Signed)
Addended by: Maggie Font on: 02/26/2012 09:17 AM   Modules accepted: Orders

## 2012-02-26 NOTE — Addendum Note (Signed)
Addended by: Sharee Pimple on: 02/26/2012 11:36 AM   Modules accepted: Orders

## 2012-02-26 NOTE — Progress Notes (Addendum)
[facility]/PAD/PVD Office Note  CC: AAA surveillance Referring Physician: Fields  History of Present Illness: 71 year old male patient of Dr. Darrick Penna followed for known AAA. The patient denies any unusual abdominal or back pain. The patient denies any new medical diagnoses or recent surgery. The patient states that he may or may not followup with his AAA in the future since it hadn't changed over the years and I explained to the patient our recommendation is yearly followup but the final decision to be followed as he is.  Past Medical History  Diagnosis Date  . Hypertension   . AAA (abdominal aortic aneurysm)   . Diverticulitis   . Arthritis     Osteo  . GERD (gastroesophageal reflux disease)     ROS: [x]  Positive   [ ]  Denies    General: [ ]  Weight loss, [ ]  Fever, [ ]  chills Neurologic: [ ]  Dizziness, [ ]  Blackouts, [ ]  Seizure [ ]  Stroke, [ ]  "Mini stroke", [ ]  Slurred speech, [ ]  Temporary blindness; [ ]  weakness in arms or legs, [ ]  Hoarseness Cardiac: [ ]  Chest pain/pressure, [ ]  Shortness of breath at rest [ ]  Shortness of breath with exertion, [ ]  Atrial fibrillation or irregular heartbeat Vascular: [ ]  Pain in legs with walking, [ ]  Pain in legs at rest, [ ]  Pain in legs at night,  [ ]  Non-healing ulcer, [ ]  Blood clot in vein/DVT,   Pulmonary: [ ]  Home oxygen, [ ]  Productive cough, [ ]  Coughing up blood, [ ]  Asthma,  [ ]  Wheezing Musculoskeletal:  [ ]  Arthritis, [ ]  Low back pain, [ ]  Joint pain Hematologic: [ ]  Easy Bruising, [ ]  Anemia; [ ]  Hepatitis Gastrointestinal: [ ]  Blood in stool, [ ]  Gastroesophageal Reflux/heartburn, [ ]  Trouble swallowing Urinary: [ ]  chronic Kidney disease, [ ]  on HD - [ ]  MWF or [ ]  TTHS, [ ]  Burning with urination, [ ]  Difficulty urinating Skin: [ ]  Rashes, [ ]  Wounds Psychological: [ ]  Anxiety, [ ]  Depression   Social History History  Substance Use Topics  . Smoking status: Former Smoker    Types:  Cigars    Quit date: 03/24/1989  . Smokeless tobacco: Never Used  . Alcohol Use: No    Family History Family History  Problem Relation Age of Onset  . Parkinson's disease Mother   . AAA (abdominal aortic aneurysm) Father   . Heart defect Sister     Congenital Heart Disease  . Heart disease Sister     Allergies  Allergen Reactions  . Sulfonamide Derivatives Nausea And Vomiting    Current Outpatient Prescriptions  Medication Sig Dispense Refill  . amLODipine (NORVASC) 5 MG tablet Take 5 mg by mouth daily.      [name] lisinopril (PRINIVIL,ZESTRIL) 40 MG tablet Take 40 mg by mouth daily.      . potassium citrate (UROCIT-K) 10 MEQ (1080 MG) SR tablet Twice daily.      . Tamsulosin HCl (FLOMAX) 0.4 MG CAPS Take 0.4 mg by mouth daily.      [name] aspirin 325 MG tablet Take 325 mg by mouth daily.      . lansoprazole (PREVACID) 30 MG capsule Take 30 mg by mouth daily.      . meclizine (ANTIVERT) 25 MG tablet Take 25 mg by mouth 3 (three) times daily as needed.      . vitamin C (ASCORBIC ACID) 500 MG tablet Take 500 mg by mouth daily.  Physical Examination  Filed Vitals:   02/26/12 0914  BP: 145/84  Pulse: 57  Resp: 14    Body mass index is 27.25 kg/(m^2).  General:  WDWN in NAD Gait: Normal HEENT: WNL Eyes: Pupils equal Pulmonary: normal non-labored breathing , without Rales, rhonchi,  wheezing Cardiac: RRR, without  Murmurs, rubs or gallops; No carotid bruits Abdomen: soft, NT, no masses Skin: no rashes, ulcers noted Vascular Exam/Pulses: 3+ radial pulses bilaterally, there is no abdominal mass palpated  Extremities without ischemic changes, no Gangrene , no cellulitis; no open wounds;  Musculoskeletal: no muscle wasting or atrophy  Neurologic: A&O X 3; Appropriate Affect ; SENSATION: normal; MOTOR FUNCTION:  moving all extremities equally. Speech is fluent/normal  Non-Invasive Vascular Imaging: AAA duplex today shows a maximum AP and transverse diameter of 2.7,  previous exam in 2011 was 2.8  ASSESSMENT/PLAN: Asymptomatic small unchanged AAA. The recommendation is for the patient followup in one year with repeat AAA duplex however he requested 2 years minimum so that will be scheduled. The patient states he  may not followup and I reiterated that decision was he has.  Lauree Chandler ANP  Clinic M.D.: [facility]

## 2012-03-24 HISTORY — PX: CARPAL TUNNEL RELEASE: SHX101

## 2014-02-22 ENCOUNTER — Encounter: Payer: Self-pay | Admitting: Family

## 2014-02-23 ENCOUNTER — Ambulatory Visit (HOSPITAL_COMMUNITY)
Admission: RE | Admit: 2014-02-23 | Discharge: 2014-02-23 | Disposition: A | Discharge status: Home / Self Care | Payer: Medicare Other | Source: Ambulatory Visit | Attending: Family | Admitting: Family

## 2014-02-23 ENCOUNTER — Ambulatory Visit (INDEPENDENT_AMBULATORY_CARE_PROVIDER_SITE_OTHER): Payer: Medicare Other | Admitting: Family

## 2014-02-23 ENCOUNTER — Encounter: Payer: Self-pay | Admitting: Family

## 2014-02-23 VITALS — BP 135/75 | HR 57 | Resp 14 | Ht 71.0 in | Wt 199.0 lb

## 2014-02-23 DIAGNOSIS — I714 Abdominal aortic aneurysm, without rupture, unspecified: Secondary | ICD-10-CM

## 2014-02-23 NOTE — Patient Instructions (Signed)
Abdominal Aortic Aneurysm An aneurysm is a weakened or damaged part of an artery wall that bulges from the normal force of blood pumping through the body. An abdominal aortic aneurysm is an aneurysm that occurs in the lower part of the aorta, the main artery of the body.  The major concern with an abdominal aortic aneurysm is that it can enlarge and burst (rupture) or blood can flow between the layers of the wall of the aorta through a tear (aorticdissection). Both of these conditions can cause bleeding inside the body and can be life threatening unless diagnosed and treated promptly. CAUSES  The exact cause of an abdominal aortic aneurysm is unknown. Some contributing factors are:   A hardening of the arteries caused by the buildup of fat and other substances in the lining of a blood vessel (arteriosclerosis).  Inflammation of the walls of an artery (arteritis).   Connective tissue diseases, such as Marfan syndrome.   Abdominal trauma.   An infection, such as syphilis or staphylococcus, in the wall of the aorta (infectious aortitis) caused by bacteria. RISK FACTORS  Risk factors that contribute to an abdominal aortic aneurysm may include:  Age older than 60 years.   High blood pressure (hypertension).  Male gender.  Ethnicity (white race).  Obesity.  Family history of aneurysm (first degree relatives only).  Tobacco use. PREVENTION  The following healthy lifestyle habits may help decrease your risk of abdominal aortic aneurysm:  Quitting smoking. Smoking can raise your blood pressure and cause arteriosclerosis.  Limiting or avoiding alcohol.  Keeping your blood pressure, blood sugar level, and cholesterol levels within normal limits.  Decreasing your salt intake. In somepeople, too much salt can raise blood pressure and increase your risk of abdominal aortic aneurysm.  Eating a diet low in saturated fats and cholesterol.  Increasing your fiber intake by including  whole grains, vegetables, and fruits in your diet. Eating these foods may help lower blood pressure.  Maintaining a healthy weight.  Staying physically active and exercising regularly. SYMPTOMS  The symptoms of abdominal aortic aneurysm may vary depending on the size and rate of growth of the aneurysm.Most grow slowly and do not have any symptoms. When symptoms do occur, they may include:  Pain (abdomen, side, lower back, or groin). The pain may vary in intensity. A sudden onset of severe pain may indicate that the aneurysm has ruptured.  Feeling full after eating only small amounts of food.  Nausea or vomiting or both.  Feeling a pulsating lump in the abdomen.  Feeling faint or passing out. DIAGNOSIS  Since most unruptured abdominal aortic aneurysms have no symptoms, they are often discovered during diagnostic exams for other conditions. An aneurysm may be found during the following procedures:  Ultrasonography (A one-time screening for abdominal aortic aneurysm by ultrasonography is also recommended for all men aged 65-75 years who have ever smoked).  X-ray exams.  A computed tomography (CT).  Magnetic resonance imaging (MRI).  Angiography or arteriography. TREATMENT  Treatment of an abdominal aortic aneurysm depends on the size of your aneurysm, your age, and risk factors for rupture. Medication to control blood pressure and pain may be used to manage aneurysms smaller than 6 cm. Regular monitoring for enlargement may be recommended by your caregiver if:  The aneurysm is 3-4 cm in size (an annual ultrasonography may be recommended).  The aneurysm is 4-4.5 cm in size (an ultrasonography every 6 months may be recommended).  The aneurysm is larger than 4.5 cm in   size (your caregiver may ask that you be examined by a vascular surgeon). If your aneurysm is larger than 6 cm, surgical repair may be recommended. There are two main methods for repair of an aneurysm:   Endovascular  repair (a minimally invasive surgery). This is done most often.  Open repair. This method is used if an endovascular repair is not possible. Document Released: 12/18/2004 Document Revised: 07/05/2012 Document Reviewed: 04/09/2012 [employer] Patient Information 2015 [employer]. This information is not intended to replace advice given to you by your health care provider. Make sure you discuss any questions you have with your health care provider.  

## 2014-02-23 NOTE — Progress Notes (Signed)
[facility] [place]  Established Abdominal Aortic Aneurysm  History of Present Illness  Derrick Moon is a 73 y.o. ([birth year]) male who patient of Dr. Oneida Alar returns today for follow up of small AAA. Previous studies demonstrate an AAA, measuring 2.8 cm by CT in 2005.  The patient denies back or abdominal pain.  The patient is not a smoker. The patient denies claudication in legs with walking, denies non healing wounds. The patient denies history of stroke or TIA symptoms. Pt denies cough or dyspnea, denies light headedness, denies any known irregular heart rhythm.   Pt Diabetic: No Pt smoker: former smoker, quit cigarettes in 1991, still smoking a cigar about one/week  He states his cholesterol is good and does not take a statin, does not take a daily ASA nor any antiplatelets nor anticoagulants.  Past Medical History  Diagnosis Date  . Hypertension   . AAA (abdominal aortic aneurysm)   . Diverticulitis   . Arthritis     Osteo  . GERD (gastroesophageal reflux disease)    Past Surgical History  Procedure Laterality Date  . Foot surgery  06/15/2009 and  03/06/2010    RIGHT  foot  . Hiatal hernia repair    . Cholecystectomy  July 16,2010  . Rotator cuff repair      Bilateral   . Biceps tendon repair      LEFT  . Carpal tunnel release Right 2014   Social History History   Social History  . Marital Status: Married    Spouse Name: N/A    Number of Children: N/A  . Years of Education: N/A   Occupational History  . Not on file.   Social History Main Topics  . Smoking status: Former Smoker    Types: Cigars    Quit date: 03/24/1989  . Smokeless tobacco: Never Used  . Alcohol Use: No  . Drug Use: No  . Sexual Activity: Not on file   Other Topics Concern  . Not on file   Social History Narrative   Family History Family History  Problem Relation Age of Onset  . Parkinson's disease Mother   . AAA (abdominal aortic aneurysm) Father   .  Heart defect Sister     Congenital Heart Disease  . Heart disease Sister     Current Outpatient Prescriptions on File Prior to Visit  Medication Sig Dispense Refill  . amLODipine (NORVASC) 5 MG tablet Take 5 mg by mouth daily.    [name] lisinopril (PRINIVIL,ZESTRIL) 40 MG tablet Take 40 mg by mouth daily.    . potassium citrate (UROCIT-K) 10 MEQ (1080 MG) SR tablet Twice daily.    . Tamsulosin HCl (FLOMAX) 0.4 MG CAPS Take 0.4 mg by mouth daily.    . vitamin C (ASCORBIC ACID) 500 MG tablet Take 500 mg by mouth daily.    [name] aspirin 325 MG tablet Take 325 mg by mouth daily.    . lansoprazole (PREVACID) 30 MG capsule Take 30 mg by mouth daily.    . meclizine (ANTIVERT) 25 MG tablet Take 25 mg by mouth 3 (three) times daily as needed.     No current facility-administered medications on file prior to visit.   Allergies  Allergen Reactions  . Sulfonamide Derivatives Nausea And Vomiting    ROS: See HPI for pertinent positives and negatives.  Physical Examination  Filed Vitals:   02/23/14 0917  BP: 135/75  Pulse: 57  Resp: 14  Height: 5\' 11"  (1.803 m)  Weight: 199 lb (90.266 kg)  SpO2: 95%   Body mass index is 27.77 kg/(m^2).  General: A&O x 3, WD.  Pulmonary: Sym exp, good air movt, CTAB, no rales, rhonchi, mild expiratory wheezing.  Cardiac: RRR with frequent premature and ectopic beats, no detected murmur.   Carotid Bruits Right Left   Negative Negative   Aorta is not palpable Radial pulses are 2+ palpable and =                          VASCULAR EXAM:                                                                                                         LE Pulses Right Left       FEMORAL  not palpable  not palpable        POPLITEAL  not palpable   not palpable       POSTERIOR TIBIAL  1+ palpable   not palpable        DORSALIS PEDIS      ANTERIOR TIBIAL 2+ palpable  2+ palpable      Gastrointestinal: soft, NTND, -G/R, - HSM, - masses palpated, - CVAT  B.  Musculoskeletal: M/S 5/5 throughout except 4/5 right UE, Extremities without ischemic changes, right great toe is surgically shorter than other toes (s/p bunionectomy).  Neurologic: CN 2-12 intact, Pain and light touch intact in extremities are intact except, Motor exam as listed above.  Non-Invasive Vascular Imaging  AAA Duplex (02/23/2014) ABDOMINAL AORTA DUPLEX EVALUATION    INDICATION: Evaluation of abdominal aorta.    PREVIOUS INTERVENTION(S):     DUPLEX EXAM:     LOCATION DIAMETER AP (cm) DIAMETER TRANSVERSE (cm) VELOCITIES (cm/sec)  Aorta Proximal 2.66  27  Aorta Mid 2.74  30  Aorta Distal 2.66 2.79   Right Common Iliac Artery Not Visualized  Not Visualized    Left Common Iliac Artery   106    Previous max aortic diameter:  2.72 x 2.72 Date: 02/26/2012     ADDITIONAL FINDINGS: Technically difficult exam due to overlying bowel gas.    IMPRESSION: Patent abdominal aorta measuring approximately 2.66 x 2.79 cm in diameter.     Medical Decision Making  The patient is a 73 y.o. male who presents with asymptomatic AAA with no increase in size in 10 years. 2.8 cm maximum diameter by abdomen/pelvis CT in 2005, no change since 2005. Pt advised to call his PCP's office ASAP and let that office know about his cardiac arrhythmia.    Based on this patient's exam and diagnostic studies, the patient will follow up in 2 years  with the following studies: AAA Duplex.  Consideration for repair of AAA would be made when the size is 5.5 cm, growth > 1 cm/yr, and symptomatic status.  I emphasized the importance of maximal medical management including strict control of blood pressure, blood glucose, and lipid levels, antiplatelet agents, obtaining regular exercise, and cessation of smoking.   The patient was given information about AAA  including signs, symptoms, treatment, and how to minimize the risk of enlargement and rupture of aneurysms.    The patient was advised to call [phone]  should the patient experience sudden onset abdominal or back pain.   Thank you for allowing [place] to participate in this patient's care.  Clemon Chambers, RN, MSN, FNP-C [facility] Office: [facility] Physician: Oneida Alar  02/23/2014, 9:20 AM

## 2014-03-10 DIAGNOSIS — N401 Enlarged prostate with lower urinary tract symptoms: Secondary | ICD-10-CM | POA: Insufficient documentation

## 2014-06-26 NOTE — Pre-Procedure Instructions (Signed)
Derrick Moon  06/26/2014   Your procedure is scheduled on:  Fri, April 15 @ 10:30 AM  Report to [facility]  at 8:30 AM.  Call this number if you have problems the morning of surgery: [phone]   Remember:   Do not eat food or drink liquids after midnight.   Take these medicines the morning of surgery with A SIP OF WATER: Albuterol<Bring Your Inhaler With You>,Amlodipine(Norvasc),and Tamsulosin(Flomax)                No Goody's,BC's,Aleve,Aspirin,Ibuprofen,Fish Oil,or any Herbal Medications.    Do not wear jewelry.  Do not wear lotions, powders, or colognes.   Men may shave face and neck.  Do not bring valuables to the hospital.  [facility] is not responsible                  for any belongings or valuables.               Contacts, dentures or bridgework may not be worn into surgery.  Leave suitcase in the car. After surgery it may be brought to your room.  For patients admitted to the hospital, discharge time is determined by your                treatment team.                 Special Instructions:  [place] - Preparing for Surgery  Before surgery, you can play an important role.  Because skin is not sterile, your skin needs to be as free of germs as possible.  You can reduce the number of germs on you skin by washing with CHG (chlorahexidine gluconate) soap before surgery.  CHG is an antiseptic cleaner which kills germs and bonds with the skin to continue killing germs even after washing.  Please DO NOT use if you have an allergy to CHG or antibacterial soaps.  If your skin becomes reddened/irritated stop using the CHG and inform your nurse when you arrive at Short Stay.  Do not shave (including legs and underarms) for at least 48 hours prior to the first CHG shower.  You may shave your face.  Please follow these instructions carefully:   1.  Shower with CHG Soap the night before surgery and the                                morning of Surgery.  2.  If you  choose to wash your hair, wash your hair first as usual with your       normal shampoo.  3.  After you shampoo, rinse your hair and body thoroughly to remove the                      Shampoo.  4.  Use CHG as you would any other liquid soap.  You can apply chg directly       to the skin and wash gently with scrungie or a clean washcloth.  5.  Apply the CHG Soap to your body ONLY FROM THE NECK DOWN.        Do not use on open wounds or open sores.  Avoid contact with your eyes,       ears, mouth and genitals (private parts).  Wash genitals (private parts)       with your normal soap.  6.  Wash  thoroughly, paying special attention to the area where your surgery        will be performed.  7.  Thoroughly rinse your body with warm water from the neck down.  8.  DO NOT shower/wash with your normal soap after using and rinsing off       the CHG Soap.  9.  Pat yourself dry with a clean towel.            10.  Wear clean pajamas.            11.  Place clean sheets on your bed the night of your first shower and do not        sleep with pets.  Day of Surgery  Do not apply any lotions/deoderants the morning of surgery.  Please wear clean clothes to the hospital/surgery center.     Please read over the following fact sheets that you were given: Pain Booklet, Coughing and Deep Breathing, Blood Transfusion Information, MRSA Information and Surgical Site Infection Prevention

## 2014-06-27 ENCOUNTER — Encounter (HOSPITAL_COMMUNITY): Payer: Self-pay

## 2014-06-27 ENCOUNTER — Encounter (HOSPITAL_COMMUNITY)
Admission: RE | Admit: 2014-06-27 | Discharge: 2014-06-27 | Disposition: A | Discharge status: Home / Self Care | Payer: Medicare Other | Source: Ambulatory Visit | Attending: Orthopedic Surgery | Admitting: Orthopedic Surgery

## 2014-06-27 DIAGNOSIS — Z01810 Encounter for preprocedural cardiovascular examination: Secondary | ICD-10-CM | POA: Insufficient documentation

## 2014-06-27 DIAGNOSIS — Z01812 Encounter for preprocedural laboratory examination: Secondary | ICD-10-CM | POA: Diagnosis present

## 2014-06-27 HISTORY — DX: Personal history of other diseases of the respiratory system: Z87.09

## 2014-06-27 HISTORY — DX: Pneumonia, unspecified organism: J18.9

## 2014-06-27 HISTORY — DX: Pain in unspecified joint: M25.50

## 2014-06-27 HISTORY — DX: Chronic obstructive pulmonary disease, unspecified: J44.9

## 2014-06-27 HISTORY — DX: Effusion, unspecified joint: M25.40

## 2014-06-27 HISTORY — DX: Personal history of colonic polyps: Z86.010

## 2014-06-27 HISTORY — DX: Unspecified cataract: H26.9

## 2014-06-27 HISTORY — DX: Nocturia: R35.1

## 2014-06-27 HISTORY — DX: Personal history of other specified conditions: Z87.898

## 2014-06-27 HISTORY — DX: Benign prostatic hyperplasia without lower urinary tract symptoms: N40.0

## 2014-06-27 HISTORY — DX: Personal history of colon polyps, unspecified: Z86.0100

## 2014-06-27 HISTORY — DX: Urgency of urination: R39.15

## 2014-06-27 LAB — PROTIME-INR
INR: 0.99 (ref 0.00–1.49)
Prothrombin Time: 13.2 seconds (ref 11.6–15.2)

## 2014-06-27 LAB — CBC
HEMATOCRIT: 50.3 % (ref 39.0–52.0)
HEMOGLOBIN: 16.2 g/dL (ref 13.0–17.0)
MCH: 29.2 pg (ref 26.0–34.0)
MCHC: 32.2 g/dL (ref 30.0–36.0)
MCV: 90.8 fL (ref 78.0–100.0)
Platelets: 274 10*3/uL (ref 150–400)
RBC: 5.54 MIL/uL (ref 4.22–5.81)
RDW: 15 % (ref 11.5–15.5)
WBC: 9.9 10*3/uL (ref 4.0–10.5)

## 2014-06-27 LAB — BASIC METABOLIC PANEL
Anion gap: 5 (ref 5–15)
BUN: 17 mg/dL (ref 6–23)
CALCIUM: 9.6 mg/dL (ref 8.4–10.5)
CO2: 30 mmol/L (ref 19–32)
Chloride: 106 mmol/L (ref 96–112)
Creatinine, Ser: 1.06 mg/dL (ref 0.50–1.35)
GFR, EST AFRICAN AMERICAN: 78 mL/min — AB (ref >90)
GFR, EST NON AFRICAN AMERICAN: 67 mL/min — AB (ref >90)
Glucose, Bld: 88 mg/dL (ref 70–99)
Potassium: 4.2 mmol/L (ref 3.5–5.1)
SODIUM: 141 mmol/L (ref 135–145)

## 2014-06-27 LAB — SURGICAL PCR SCREEN
MRSA, PCR: NEGATIVE
STAPHYLOCOCCUS AUREUS: NEGATIVE

## 2014-06-27 LAB — TYPE AND SCREEN
ABO/RH(D): A POS
ANTIBODY SCREEN: NEGATIVE

## 2014-06-27 LAB — APTT: aPTT: 30 seconds (ref 24–37)

## 2014-06-27 LAB — ABO/RH: ABO/RH(D): A POS

## 2014-06-27 NOTE — Progress Notes (Signed)
Left message for Dr.Norris that orders need to be put into epic.

## 2014-06-27 NOTE — Progress Notes (Addendum)
Echo report in epic from 2010  Medical Md is Dr.Roy Fagan  Pt doesn't have a cardiologist  Denies ever having a heart cath or stress test  Denies EKG or CXR in past yr

## 2014-06-27 NOTE — H&P (Signed)
Derrick Moon is an 74 y.o. male.    Chief Complaint: right shoulder pain and weakness  HPI: Pt is a 74 y.o. male complaining of right shoulder pain for multiple years. Pain had continually increased since the beginning. X-rays in the clinic show end-stage arthritic changes of the right shoulder. Pt has tried various conservative treatments which have failed to alleviate their symptoms, including injections and therapy. Various options are discussed with the patient. Risks, benefits and expectations were discussed with the patient. Patient understand the risks, benefits and expectations and wishes to proceed with surgery.   PCP:  Derrick Noble, MD  D/C Plans:  Home   PMH: Past Medical History  Diagnosis Date  . AAA (abdominal aortic aneurysm)   . Arthritis     Osteo  . Hypertension     takes Amlodipine and Lisinpril daily  . Enlarged prostate     takes Flomax daily  . COPD (chronic obstructive pulmonary disease)     Albuterol inhaler as needed  . Pneumonia     several times with most recent within 5 yrs ago  . History of bronchitis     > 67yr ago  . History of dizziness     several yrs ago-was on Meclizine but has been off for several yrs-no problems since  . AAA (abdominal aortic aneurysm)   . Joint pain   . Joint swelling   . GERD (gastroesophageal reflux disease)     was on Prilosec but has been off for yrs-no issues now with reflux  . History of colon polyps     benign  . Urinary urgency   . Diverticulitis   . Nocturia   . Cataracts, bilateral     PSH: Past Surgical History  Procedure Laterality Date  . Foot surgery Right     x 5t  . Cholecystectomy  July 16,2010  . Rotator cuff repair      Bilateral   . Biceps tendon repair      LEFT  . Carpal tunnel release Right 2014  . Hand surgery Right   . Tumor removed from bladder    . Hernia repair Bilateral     inguinal  . Colonoscopy    . Esophagogastroduodenoscopy    . Cystoscopy      Social History:   reports that he has been smoking Cigars.  He has never used smokeless tobacco. He reports that he drinks alcohol. He reports that he does not use illicit drugs.  Allergies:  Allergies  Allergen Reactions  . Sulfonamide Derivatives Nausea And Vomiting    Medications: No current facility-administered medications for this encounter.   Current Outpatient Prescriptions  Medication Sig Dispense Refill  . albuterol (PROVENTIL HFA;VENTOLIN HFA) 108 (90 BASE) MCG/ACT inhaler Inhale 1-2 puffs into the lungs every 6 (six) hours as needed for wheezing or shortness of breath.    .Marland KitchenamLODipine (NORVASC) 5 MG tablet Take 5 mg by mouth daily.    .Marland Kitchenlisinopril (PRINIVIL,ZESTRIL) 40 MG tablet Take 40 mg by mouth daily.    . potassium citrate (UROCIT-K) 10 MEQ (1080 MG) SR tablet Twice daily.    . Tamsulosin HCl (FLOMAX) 0.4 MG CAPS Take 0.4 mg by mouth daily.    . vitamin C (ASCORBIC ACID) 500 MG tablet Take 500 mg by mouth daily.      Results for orders placed or performed during the hospital encounter of 06/27/14 (from the past 48 hour(s))  Type and screen     Status: None  Collection Time: 06/27/14  9:36 AM  Result Value Ref Range   ABO/RH(D) A POS    Antibody Screen NEG    Sample Expiration 07/11/2014   ABO/Rh     Status: None   Collection Time: 06/27/14  9:36 AM  Result Value Ref Range   ABO/RH(D) A POS   Surgical pcr screen     Status: None   Collection Time: 06/27/14  9:37 AM  Result Value Ref Range   MRSA, PCR NEGATIVE NEGATIVE   Staphylococcus aureus NEGATIVE NEGATIVE    Comment:        The Xpert SA Assay (FDA approved for NASAL specimens in patients over 55 years of age), is one component of a comprehensive surveillance program.  Test performance has been validated by [facility] for patients greater than or equal to 27 year old. It is not intended to diagnose infection nor to guide or monitor treatment.   Basic metabolic panel     Status: Abnormal   Collection Time:  06/27/14  9:38 AM  Result Value Ref Range   Sodium 141 135 - 145 mmol/L   Potassium 4.2 3.5 - 5.1 mmol/L   Chloride 106 96 - 112 mmol/L   CO2 30 19 - 32 mmol/L   Glucose, Bld 88 70 - 99 mg/dL   BUN 17 6 - 23 mg/dL   Creatinine, Ser 1.06 0.50 - 1.35 mg/dL   Calcium 9.6 8.4 - 10.5 mg/dL   GFR calc non Af Amer 67 (L) >90 mL/min   GFR calc Af Amer 78 (L) >90 mL/min    Comment: (NOTE) The eGFR has been calculated using the CKD EPI equation. This calculation has not been validated in all clinical situations. eGFR's persistently <90 mL/min signify possible Chronic Kidney Disease.    Anion gap 5 5 - 15  CBC     Status: None   Collection Time: 06/27/14  9:38 AM  Result Value Ref Range   WBC 9.9 4.0 - 10.5 K/uL   RBC 5.54 4.22 - 5.81 MIL/uL   Hemoglobin 16.2 13.0 - 17.0 g/dL   HCT 50.3 39.0 - 52.0 %   MCV 90.8 78.0 - 100.0 fL   MCH 29.2 26.0 - 34.0 pg   MCHC 32.2 30.0 - 36.0 g/dL   RDW 15.0 11.5 - 15.5 %   Platelets 274 150 - 400 K/uL  Protime-INR     Status: None   Collection Time: 06/27/14  9:38 AM  Result Value Ref Range   Prothrombin Time 13.2 11.6 - 15.2 seconds   INR 0.99 0.00 - 1.49  PTT     Status: None   Collection Time: 06/27/14  9:38 AM  Result Value Ref Range   aPTT 30 24 - 37 seconds   No results found.  ROS: Pain with rom of the right upper extremity  Physical Exam:  Alert and oriented 74 y.o. male in no acute distress Cranial nerves 2-12 intact Cervical spine: full rom with no tenderness, nv intact distally Chest: active breath sounds bilaterally, no wheeze rhonchi or rales Heart: regular rate and rhythm, no murmur Abd: non tender non distended with active bowel sounds Hip is stable with rom  Right shoulder with moderate weakness and limited rom nv intact distally No rashes or edema   Assessment/Plan Assessment: right shoulder rotator cuff insufficiency  Plan: Patient will undergo a right reverse total shoulder by Dr. Veverly Fells at [facility].  Risks benefits and expectations were discussed with the patient. Patient understand risks,  benefits and expectations and wishes to proceed.

## 2014-07-06 MED ORDER — CEFAZOLIN SODIUM-DEXTROSE 2-3 GM-% IV SOLR
2.0000 g | INTRAVENOUS | Status: AC
  Administered 2014-07-07: 2 g via INTRAVENOUS
  Filled 2014-07-06: qty 50

## 2014-07-06 MED ORDER — CHLORHEXIDINE GLUCONATE 4 % EX LIQD
60.0000 mL | Freq: Once | CUTANEOUS | Status: DC
Start: 2014-07-06 — End: 2014-07-07
  Filled 2014-07-06: qty 60

## 2014-07-07 ENCOUNTER — Inpatient Hospital Stay (HOSPITAL_COMMUNITY): Payer: Medicare Other | Admitting: Anesthesiology

## 2014-07-07 ENCOUNTER — Inpatient Hospital Stay (HOSPITAL_COMMUNITY)
Admission: RE | Admit: 2014-07-07 | Discharge: 2014-07-08 | DRG: 483 | Disposition: A | Discharge status: Home / Self Care | Payer: Medicare Other | Source: Ambulatory Visit | Attending: Orthopedic Surgery | Admitting: Orthopedic Surgery

## 2014-07-07 ENCOUNTER — Encounter (HOSPITAL_COMMUNITY): Payer: Self-pay | Admitting: *Deleted

## 2014-07-07 ENCOUNTER — Inpatient Hospital Stay (HOSPITAL_COMMUNITY): Payer: Medicare Other | Admitting: Vascular Surgery

## 2014-07-07 ENCOUNTER — Encounter (HOSPITAL_COMMUNITY)
Admission: RE | Disposition: A | Discharge status: Home / Self Care | Payer: Self-pay | Source: Ambulatory Visit | Attending: Orthopedic Surgery

## 2014-07-07 ENCOUNTER — Inpatient Hospital Stay (HOSPITAL_COMMUNITY): Payer: Medicare Other

## 2014-07-07 DIAGNOSIS — M75101 Unspecified rotator cuff tear or rupture of right shoulder, not specified as traumatic: Secondary | ICD-10-CM | POA: Diagnosis present

## 2014-07-07 DIAGNOSIS — M1990 Unspecified osteoarthritis, unspecified site: Secondary | ICD-10-CM | POA: Diagnosis present

## 2014-07-07 DIAGNOSIS — Z882 Allergy status to sulfonamides status: Secondary | ICD-10-CM

## 2014-07-07 DIAGNOSIS — I10 Essential (primary) hypertension: Secondary | ICD-10-CM | POA: Diagnosis present

## 2014-07-07 DIAGNOSIS — K219 Gastro-esophageal reflux disease without esophagitis: Secondary | ICD-10-CM | POA: Diagnosis present

## 2014-07-07 DIAGNOSIS — N400 Enlarged prostate without lower urinary tract symptoms: Secondary | ICD-10-CM | POA: Diagnosis present

## 2014-07-07 DIAGNOSIS — Z79899 Other long term (current) drug therapy: Secondary | ICD-10-CM

## 2014-07-07 DIAGNOSIS — J449 Chronic obstructive pulmonary disease, unspecified: Secondary | ICD-10-CM | POA: Diagnosis present

## 2014-07-07 DIAGNOSIS — Z96611 Presence of right artificial shoulder joint: Secondary | ICD-10-CM

## 2014-07-07 DIAGNOSIS — F17210 Nicotine dependence, cigarettes, uncomplicated: Secondary | ICD-10-CM | POA: Diagnosis present

## 2014-07-07 DIAGNOSIS — M25511 Pain in right shoulder: Secondary | ICD-10-CM | POA: Diagnosis present

## 2014-07-07 DIAGNOSIS — Z96619 Presence of unspecified artificial shoulder joint: Secondary | ICD-10-CM

## 2014-07-07 HISTORY — PX: REVERSE TOTAL SHOULDER ARTHROPLASTY: SHX2344

## 2014-07-07 HISTORY — PX: REVERSE SHOULDER ARTHROPLASTY: SHX5054

## 2014-07-07 SURGERY — ARTHROPLASTY, SHOULDER, TOTAL, REVERSE
Anesthesia: General | Site: Shoulder | Laterality: Right

## 2014-07-07 MED ORDER — ONDANSETRON HCL 4 MG/2ML IJ SOLN: 4.0000 mg | Freq: Four times a day (QID) | INTRAMUSCULAR | Status: DC | PRN

## 2014-07-07 MED ORDER — MENTHOL 3 MG MT LOZG: 1.0000 | LOZENGE | OROMUCOSAL | Status: DC | PRN

## 2014-07-07 MED ORDER — HYDROMORPHONE HCL 1 MG/ML IJ SOLN
1.0000 mg | INTRAMUSCULAR | Status: DC | PRN
  Administered 2014-07-08 (×2): 1 mg via INTRAVENOUS
  Filled 2014-07-07 (×2): qty 1

## 2014-07-07 MED ORDER — GLYCOPYRROLATE 0.2 MG/ML IJ SOLN
INTRAMUSCULAR | Status: DC | PRN
  Administered 2014-07-07: 0.4 mg via INTRAVENOUS

## 2014-07-07 MED ORDER — ONDANSETRON HCL 4 MG PO TABS: 4.0000 mg | ORAL_TABLET | Freq: Four times a day (QID) | ORAL | Status: DC | PRN

## 2014-07-07 MED ORDER — FENTANYL CITRATE (PF) 250 MCG/5ML IJ SOLN
INTRAMUSCULAR | Status: AC
  Filled 2014-07-07: qty 5

## 2014-07-07 MED ORDER — PROMETHAZINE HCL 25 MG/ML IJ SOLN: 6.2500 mg | INTRAMUSCULAR | Status: DC | PRN

## 2014-07-07 MED ORDER — PHENOL 1.4 % MT LIQD: 1.0000 | OROMUCOSAL | Status: DC | PRN

## 2014-07-07 MED ORDER — LIDOCAINE HCL (CARDIAC) 20 MG/ML IV SOLN
INTRAVENOUS | Status: AC
  Filled 2014-07-07: qty 5

## 2014-07-07 MED ORDER — METHOCARBAMOL 500 MG PO TABS: 500.0000 mg | ORAL_TABLET | Freq: Three times a day (TID) | ORAL | Status: DC | PRN

## 2014-07-07 MED ORDER — CEFAZOLIN SODIUM-DEXTROSE 2-3 GM-% IV SOLR
2.0000 g | Freq: Four times a day (QID) | INTRAVENOUS | Status: AC
  Administered 2014-07-07 – 2014-07-08 (×3): 2 g via INTRAVENOUS
  Filled 2014-07-07 (×3): qty 50

## 2014-07-07 MED ORDER — ACETAMINOPHEN 650 MG RE SUPP: 650.0000 mg | Freq: Four times a day (QID) | RECTAL | Status: DC | PRN

## 2014-07-07 MED ORDER — EPHEDRINE SULFATE 50 MG/ML IJ SOLN
INTRAMUSCULAR | Status: DC | PRN
  Administered 2014-07-07 (×3): 5 mg via INTRAVENOUS

## 2014-07-07 MED ORDER — HYDROMORPHONE HCL 1 MG/ML IJ SOLN
INTRAMUSCULAR | Status: AC
  Filled 2014-07-07: qty 1

## 2014-07-07 MED ORDER — SUCCINYLCHOLINE CHLORIDE 20 MG/ML IJ SOLN
INTRAMUSCULAR | Status: AC
  Filled 2014-07-07: qty 1

## 2014-07-07 MED ORDER — POTASSIUM CITRATE ER 10 MEQ (1080 MG) PO TBCR
20.0000 meq | EXTENDED_RELEASE_TABLET | Freq: Three times a day (TID) | ORAL | Status: DC
  Administered 2014-07-07: 20 meq via ORAL
  Filled 2014-07-07 (×6): qty 2

## 2014-07-07 MED ORDER — GLYCOPYRROLATE 0.2 MG/ML IJ SOLN
INTRAMUSCULAR | Status: AC
  Filled 2014-07-07: qty 3

## 2014-07-07 MED ORDER — LISINOPRIL 40 MG PO TABS
40.0000 mg | ORAL_TABLET | Freq: Every day | ORAL | Status: DC
  Administered 2014-07-07: 40 mg via ORAL
  Filled 2014-07-07 (×2): qty 1

## 2014-07-07 MED ORDER — BUPIVACAINE-EPINEPHRINE (PF) 0.25% -1:200000 IJ SOLN
INTRAMUSCULAR | Status: AC
  Filled 2014-07-07: qty 30

## 2014-07-07 MED ORDER — LACTATED RINGERS IV SOLN
INTRAVENOUS | Status: DC
  Administered 2014-07-07 (×2): via INTRAVENOUS

## 2014-07-07 MED ORDER — BUPIVACAINE-EPINEPHRINE 0.25% -1:200000 IJ SOLN
INTRAMUSCULAR | Status: DC | PRN
  Administered 2014-07-07: 7 mL

## 2014-07-07 MED ORDER — ALBUTEROL SULFATE (2.5 MG/3ML) 0.083% IN NEBU: 1.0000 mL | INHALATION_SOLUTION | Freq: Four times a day (QID) | RESPIRATORY_TRACT | Status: DC | PRN

## 2014-07-07 MED ORDER — DEXAMETHASONE SODIUM PHOSPHATE 10 MG/ML IJ SOLN
INTRAMUSCULAR | Status: AC
  Filled 2014-07-07: qty 1

## 2014-07-07 MED ORDER — ARTIFICIAL TEARS OP OINT
TOPICAL_OINTMENT | OPHTHALMIC | Status: AC
  Filled 2014-07-07: qty 3.5

## 2014-07-07 MED ORDER — ONDANSETRON HCL 4 MG/2ML IJ SOLN
INTRAMUSCULAR | Status: DC | PRN
  Administered 2014-07-07: 4 mg via INTRAVENOUS

## 2014-07-07 MED ORDER — DEXAMETHASONE SODIUM PHOSPHATE 10 MG/ML IJ SOLN
INTRAMUSCULAR | Status: DC | PRN
  Administered 2014-07-07: 10 mg via INTRAVENOUS

## 2014-07-07 MED ORDER — MIDAZOLAM HCL 2 MG/2ML IJ SOLN
1.0000 mg | INTRAMUSCULAR | Status: DC | PRN
  Administered 2014-07-07: 2 mg via INTRAVENOUS
  Filled 2014-07-07: qty 2

## 2014-07-07 MED ORDER — FENTANYL CITRATE (PF) 100 MCG/2ML IJ SOLN
50.0000 ug | INTRAMUSCULAR | Status: DC | PRN
  Administered 2014-07-07: 50 ug via INTRAVENOUS
  Filled 2014-07-07: qty 2

## 2014-07-07 MED ORDER — TAMSULOSIN HCL 0.4 MG PO CAPS
0.4000 mg | ORAL_CAPSULE | Freq: Every day | ORAL | Status: DC
  Administered 2014-07-08: 0.4 mg via ORAL
  Filled 2014-07-07: qty 1

## 2014-07-07 MED ORDER — METOCLOPRAMIDE HCL 5 MG PO TABS: 5.0000 mg | ORAL_TABLET | Freq: Three times a day (TID) | ORAL | Status: DC | PRN

## 2014-07-07 MED ORDER — PHENYLEPHRINE HCL 10 MG/ML IJ SOLN
INTRAMUSCULAR | Status: DC | PRN
  Administered 2014-07-07: 80 ug via INTRAVENOUS
  Administered 2014-07-07: 120 ug via INTRAVENOUS

## 2014-07-07 MED ORDER — ACETAMINOPHEN 325 MG PO TABS: 650.0000 mg | ORAL_TABLET | Freq: Four times a day (QID) | ORAL | Status: DC | PRN

## 2014-07-07 MED ORDER — NEOSTIGMINE METHYLSULFATE 10 MG/10ML IV SOLN
INTRAVENOUS | Status: AC
  Filled 2014-07-07: qty 1

## 2014-07-07 MED ORDER — BISACODYL 10 MG RE SUPP: 10.0000 mg | Freq: Every day | RECTAL | Status: DC | PRN

## 2014-07-07 MED ORDER — GLYCOPYRROLATE 0.2 MG/ML IJ SOLN
INTRAMUSCULAR | Status: AC
  Filled 2014-07-07: qty 2

## 2014-07-07 MED ORDER — ARTIFICIAL TEARS OP OINT
TOPICAL_OINTMENT | OPHTHALMIC | Status: DC | PRN
  Administered 2014-07-07: 1 via OPHTHALMIC

## 2014-07-07 MED ORDER — METOCLOPRAMIDE HCL 5 MG/ML IJ SOLN: 5.0000 mg | Freq: Three times a day (TID) | INTRAMUSCULAR | Status: DC | PRN

## 2014-07-07 MED ORDER — SUCCINYLCHOLINE CHLORIDE 20 MG/ML IJ SOLN
INTRAMUSCULAR | Status: DC | PRN
  Administered 2014-07-07: 120 mg via INTRAVENOUS

## 2014-07-07 MED ORDER — METHOCARBAMOL 1000 MG/10ML IJ SOLN
500.0000 mg | Freq: Four times a day (QID) | INTRAVENOUS | Status: DC | PRN
  Filled 2014-07-07: qty 5

## 2014-07-07 MED ORDER — OXYCODONE-ACETAMINOPHEN 5-325 MG PO TABS: 1.0000 | ORAL_TABLET | ORAL | Status: DC | PRN

## 2014-07-07 MED ORDER — AMLODIPINE BESYLATE 5 MG PO TABS
5.0000 mg | ORAL_TABLET | Freq: Every day | ORAL | Status: DC
  Administered 2014-07-07 – 2014-07-08 (×2): 5 mg via ORAL
  Filled 2014-07-07 (×2): qty 1

## 2014-07-07 MED ORDER — PROPOFOL 10 MG/ML IV BOLUS
INTRAVENOUS | Status: AC
  Filled 2014-07-07: qty 20

## 2014-07-07 MED ORDER — POLYETHYLENE GLYCOL 3350 17 G PO PACK: 17.0000 g | PACK | Freq: Every day | ORAL | Status: DC | PRN

## 2014-07-07 MED ORDER — HYDROMORPHONE HCL 1 MG/ML IJ SOLN
0.2500 mg | INTRAMUSCULAR | Status: DC | PRN
  Administered 2014-07-07: 0.5 mg via INTRAVENOUS

## 2014-07-07 MED ORDER — FENTANYL CITRATE (PF) 100 MCG/2ML IJ SOLN
INTRAMUSCULAR | Status: DC | PRN
  Administered 2014-07-07: 50 ug via INTRAVENOUS

## 2014-07-07 MED ORDER — PHENYLEPHRINE 40 MCG/ML (10ML) SYRINGE FOR IV PUSH (FOR BLOOD PRESSURE SUPPORT)
PREFILLED_SYRINGE | INTRAVENOUS | Status: AC
  Filled 2014-07-07: qty 10

## 2014-07-07 MED ORDER — PROPOFOL 10 MG/ML IV BOLUS
INTRAVENOUS | Status: DC | PRN
  Administered 2014-07-07: 150 mg via INTRAVENOUS

## 2014-07-07 MED ORDER — SODIUM CHLORIDE 0.9 % IV SOLN
INTRAVENOUS | Status: DC
  Administered 2014-07-07 – 2014-07-08 (×2): via INTRAVENOUS

## 2014-07-07 MED ORDER — OXYCODONE HCL 5 MG PO TABS
5.0000 mg | ORAL_TABLET | ORAL | Status: DC | PRN
  Administered 2014-07-07 – 2014-07-08 (×3): 10 mg via ORAL
  Filled 2014-07-07 (×3): qty 2

## 2014-07-07 MED ORDER — SODIUM CHLORIDE 0.9 % IV SOLN
10.0000 mg | INTRAVENOUS | Status: DC | PRN
  Administered 2014-07-07: 20 ug/min via INTRAVENOUS

## 2014-07-07 MED ORDER — METHOCARBAMOL 500 MG PO TABS
500.0000 mg | ORAL_TABLET | Freq: Four times a day (QID) | ORAL | Status: DC | PRN
  Administered 2014-07-07 – 2014-07-08 (×2): 500 mg via ORAL
  Filled 2014-07-07 (×2): qty 1

## 2014-07-07 MED ORDER — VITAMIN C 500 MG PO TABS
500.0000 mg | ORAL_TABLET | Freq: Every day | ORAL | Status: DC
  Administered 2014-07-07 – 2014-07-08 (×2): 500 mg via ORAL
  Filled 2014-07-07 (×2): qty 1

## 2014-07-07 SURGICAL SUPPLY — 71 items
BIT DRILL 170X2.5X (BIT) IMPLANT
BIT DRILL 5/64X5 DISP (BIT) ×3 IMPLANT
BIT DRL 170X2.5X (BIT)
BLADE SAG 18X100X1.27 (BLADE) ×3 IMPLANT
CAPT SHLDR REVTOTAL 1 ×2 IMPLANT
CLOSURE STERI-STRIP 1/2X4 (GAUZE/BANDAGES/DRESSINGS) ×1
CLOSURE WOUND 1/2 X4 (GAUZE/BANDAGES/DRESSINGS) ×1
CLSR STERI-STRIP ANTIMIC 1/2X4 (GAUZE/BANDAGES/DRESSINGS) ×1 IMPLANT
COVER SURGICAL LIGHT HANDLE (MISCELLANEOUS) ×3 IMPLANT
DRAPE INCISE IOBAN 66X45 STRL (DRAPES) ×4 IMPLANT
DRAPE U-SHAPE 47X51 STRL (DRAPES) ×3 IMPLANT
DRAPE X-RAY CASS 24X20 (DRAPES) IMPLANT
DRILL 2.5 (BIT)
DRSG ADAPTIC 3X8 NADH LF (GAUZE/BANDAGES/DRESSINGS) ×3 IMPLANT
DRSG PAD ABDOMINAL 8X10 ST (GAUZE/BANDAGES/DRESSINGS) ×3 IMPLANT
DURAPREP 26ML APPLICATOR (WOUND CARE) ×3 IMPLANT
ELECT BLADE 4.0 EZ CLEAN MEGAD (MISCELLANEOUS) ×3
ELECT NDL TIP 2.8 STRL (NEEDLE) ×1 IMPLANT
ELECT NEEDLE TIP 2.8 STRL (NEEDLE) ×3 IMPLANT
ELECT REM PT RETURN 9FT ADLT (ELECTROSURGICAL) ×3
ELECTRODE BLDE 4.0 EZ CLN MEGD (MISCELLANEOUS) ×1 IMPLANT
ELECTRODE REM PT RTRN 9FT ADLT (ELECTROSURGICAL) ×1 IMPLANT
GAUZE SPONGE 4X4 12PLY STRL (GAUZE/BANDAGES/DRESSINGS) ×3 IMPLANT
GLOVE BIOGEL PI ORTHO PRO 7.5 (GLOVE) ×2
GLOVE BIOGEL PI ORTHO PRO SZ8 (GLOVE) ×2
GLOVE ORTHO TXT STRL SZ7.5 (GLOVE) ×3 IMPLANT
GLOVE PI ORTHO PRO STRL 7.5 (GLOVE) ×1 IMPLANT
GLOVE PI ORTHO PRO STRL SZ8 (GLOVE) ×1 IMPLANT
GLOVE SURG ORTHO 8.5 STRL (GLOVE) ×3 IMPLANT
GOWN STRL REUS W/ TWL LRG LVL3 (GOWN DISPOSABLE) ×1 IMPLANT
GOWN STRL REUS W/ TWL XL LVL3 (GOWN DISPOSABLE) ×2 IMPLANT
GOWN STRL REUS W/TWL LRG LVL3 (GOWN DISPOSABLE) ×3
GOWN STRL REUS W/TWL XL LVL3 (GOWN DISPOSABLE) ×6
HANDPIECE INTERPULSE COAX TIP (DISPOSABLE)
KIT BASIN OR (CUSTOM PROCEDURE TRAY) ×3 IMPLANT
KIT ROOM TURNOVER OR (KITS) ×3 IMPLANT
MANIFOLD NEPTUNE II (INSTRUMENTS) ×3 IMPLANT
MARKER SKIN DUAL TIP RULER LAB (MISCELLANEOUS) ×2 IMPLANT
NDL 1/2 CIR MAYO (NEEDLE) ×1 IMPLANT
NDL HYPO 25GX1X1/2 BEV (NEEDLE) ×1 IMPLANT
NEEDLE 1/2 CIR MAYO (NEEDLE) ×3 IMPLANT
NEEDLE HYPO 25GX1X1/2 BEV (NEEDLE) ×3 IMPLANT
NS IRRIG 1000ML POUR BTL (IV SOLUTION) ×5 IMPLANT
PACK SHOULDER (CUSTOM PROCEDURE TRAY) ×3 IMPLANT
PAD ARMBOARD 7.5X6 YLW CONV (MISCELLANEOUS) ×6 IMPLANT
PIN GUIDE 1.2 (PIN) IMPLANT
PIN GUIDE GLENOPHERE 1.5MX300M (PIN) IMPLANT
PIN METAGLENE 2.5 (PIN) IMPLANT
SET HNDPC FAN SPRY TIP SCT (DISPOSABLE) IMPLANT
SLING ARM FOAM STRAP LRG (SOFTGOODS) ×2 IMPLANT
SLING ARM LRG ADULT FOAM STRAP (SOFTGOODS) ×2 IMPLANT
SLING ARM MED ADULT FOAM STRAP (SOFTGOODS) IMPLANT
SPONGE LAP 18X18 X RAY DECT (DISPOSABLE) ×1 IMPLANT
SPONGE LAP 4X18 X RAY DECT (DISPOSABLE) ×1 IMPLANT
STRIP CLOSURE SKIN 1/2X4 (GAUZE/BANDAGES/DRESSINGS) ×2 IMPLANT
SUCTION FRAZIER TIP 10 FR DISP (SUCTIONS) ×3 IMPLANT
SUT FIBERWIRE #2 38 T-5 BLUE (SUTURE) ×6
SUT MNCRL AB 4-0 PS2 18 (SUTURE) ×3 IMPLANT
SUT VIC AB 0 CT1 27 (SUTURE) ×3
SUT VIC AB 0 CT1 27XBRD ANBCTR (SUTURE) IMPLANT
SUT VIC AB 2-0 CT1 27 (SUTURE) ×3
SUT VIC AB 2-0 CT1 TAPERPNT 27 (SUTURE) ×1 IMPLANT
SUTURE FIBERWR #2 38 T-5 BLUE (SUTURE) ×2 IMPLANT
SYR CONTROL 10ML LL (SYRINGE) ×3 IMPLANT
TAPE CLOTH SURG 4X10 WHT LF (GAUZE/BANDAGES/DRESSINGS) ×2 IMPLANT
TOWEL OR 17X24 6PK STRL BLUE (TOWEL DISPOSABLE) ×3 IMPLANT
TOWEL OR 17X26 10 PK STRL BLUE (TOWEL DISPOSABLE) ×3 IMPLANT
TOWER CARTRIDGE SMART MIX (DISPOSABLE) IMPLANT
TRAY FOLEY CATH 16FRSI W/METER (SET/KITS/TRAYS/PACK) ×1 IMPLANT
WATER STERILE IRR 1000ML POUR (IV SOLUTION) ×1 IMPLANT
YANKAUER SUCT BULB TIP NO VENT (SUCTIONS) IMPLANT

## 2014-07-07 NOTE — Interval H&P Note (Signed)
History and Physical Interval Note:  07/07/2014 9:44 AM  Derrick Moon  has presented today for surgery, with the diagnosis of RIGHT ROTATOR CUFF INSUFFICIENCY  The various methods of treatment have been discussed with the patient and family. After consideration of risks, benefits and other options for treatment, the patient has consented to  Procedure(s): RIGHT REVERSE TOTAL SHOULDER  (Right) as a surgical intervention .  The patient's history has been reviewed, patient examined, no change in status, stable for surgery.  I have reviewed the patient's chart and labs.  Questions were answered to the patient's satisfaction.     [name],STEVEN R

## 2014-07-07 NOTE — Op Note (Signed)
NAMEZAYAAN, Derrick Moon                ACCOUNT NO.:  [id number]  MEDICAL RECORD NO.:  [id number]  LOCATION:  5N19C                        FACILITY:  [facility]  PHYSICIAN:  Doran Heater. Veverly Fells, M.D. DATE OF BIRTH:  Jul 26, 1940  DATE OF PROCEDURE:  07/07/2014 DATE OF DISCHARGE:                              OPERATIVE REPORT   PREOPERATIVE DIAGNOSIS:  Right shoulder rotator cuff tear arthropathy.  POSTOPERATIVE DIAGNOSIS:  Right shoulder rotator cuff tear arthropathy.  PROCEDURE PERFORMED:  Right shoulder reverse total shoulder arthroplasty using DePuy Delta Xtend prosthesis.  ATTENDING SURGEON:  Doran Heater. Veverly Fells, M.D.  ASSISTANT:  Abbott Pao. Dixon, PA-C, who scrubbed the entire procedure and was necessary for satisfactory completion of surgery.  ANESTHESIA:  General anesthesia was used plus interscalene block.  ESTIMATED BLOOD LOSS:  Less than 200 mL.  FLUID REPLACEMENT:  1200 mL of crystalloid.  INSTRUMENT COUNTS:  Correct.  COMPLICATIONS:  There were no complications.  ANTIBIOTICS:  Perioperative antibiotics were given.  INDICATIONS:  The patient is a 74 year old male with worsening right shoulder pain and dysfunction secondary to rotator cuff tear arthropathy.  On exam, the patient has a pseudoparalytic shoulder, really has a nonfunctional shoulder, not able do any ADLs.  Discussed options of management with the patient including continued conservative management versus surgical treatment.  The patient elected to proceed with surgery to restore function, to eliminate pain to his shoulder. Informed consent obtained.  DESCRIPTION OF PROCEDURE:  After an adequate level of anesthesia was achieved, the patient was positioned in the modified beach-chair position.  Right shoulder correctly identified and sterilely prepped and draped in the usual manner.  Time-out was called.  After a time-out, we initiated surgery with a deltopectoral incision starting at the coracoid process extending  down to the anterior humerus.  Dissection down through subcutaneous tissue.  Cephalic vein identified and taken laterally with the deltoid, pectoralis taken medially.  Conjoined tendon identified and retracted medially.  There was no subscapularis.  We released the inferior capsule off progressively externally rotating carefully to protect the axillary nerve.  We then went ahead and delivered the humerus out of the wound with some extension and rotation.  We placed a Building control surveyor.  We then entered the proximal humerus with the 6 mm reamer, reaming up to a size 14.  Next, we placed our intramedullary resection guide for the humeral head cut.  We cut the head 720 degrees of retroversion for the DePuy Delta extend reverse prosthesis.  We then went ahead and removed osteophytes from the medial head and neck area. We then did our modular reaming for the metaphyseal component which was an epi 2 right component.  Once we had that reamed, we then impacted our real 14 body epi 2 right prosthesis into position again in 20 degrees of retroversion.  We then retracted the humerus posteriorly, removed the biceps, the remaining labral tissue.  Careful to protect the axillary nerve and did a 360 degree exposure of the glenoid placing our retractors and having that humerus retracted posteriorly.  We then placed our center guide pin reamed just down to subchondral bone. Keeping that subchondral plate in place and then drilled our central peg  hole for the metaglene and impacted the metaglene in position, placed a 48 locked screw inferiorly, a 36 into the base of the coracoid, and then two 18 screws nonlocked anterior and posterior.  We then placed our nitinol guidewire and then selected a 42 standard glenosphere and impacted that into position, and screwed it in place.  We made sure that the axillary nerve was free and clear, and was not incarcerated during that portion of the procedure.  Next, we trialed  the shoulder with a 42+ 6 poly, and the shoulder snapped into place.  We had nice soft tissue balancing.  Good tension on the conjoint tendon.  No gapping with external rotation and negative sulcus.  We removed the trial components, thoroughly irrigated the humerus and then impacted with the impaction grafting technique with available bone graft from the humeral head.  The hydroxyapatite coated real stem 14 and the epi 2 right metaphysis, we had nice secure fit, selected our 42+ 6 polyethylene insert, impacted that on the humeral side and reduced the shoulder again checking to make sure the axillary nerve was free and clear as we do not have a subscapularis to protect our nerve.  We then irrigated thoroughly the shoulder and then repaired deltopectoral interval with 0 Vicryl suture followed by 2-0 Vicryl subcutaneous closure and 4-0 Monocryl for skin. Steri-Strips applied followed by a sterile dressing.  The patient tolerated the surgery well.     Doran Heater. Veverly Fells, M.D.     SRN/MEDQ  D:  07/07/2014  T:  07/07/2014  Job:  [id number]

## 2014-07-07 NOTE — Brief Op Note (Signed)
07/07/2014  12:36 PM  PATIENT:  Derrick Moon  74 y.o. male  PRE-OPERATIVE DIAGNOSIS:  RIGHT ROTATOR CUFF INSUFFICIENCY, ROTATOR CUFF TEAR ARTHROPATHY  POST-OPERATIVE DIAGNOSIS:  RIGHT ROTATOR CUFF INSUFFICIENCY, ROTATOR CUFF TEAR ARTHROPATHY  PROCEDURE:  Procedure(s): RIGHT REVERSE TOTAL SHOULDER  (Right) DePuy Delta Xtend   SURGEON:  Surgeon(s) and Role:    * Netta Cedars, MD - Primary  PHYSICIAN ASSISTANT:   ASSISTANTS: Ventura Bruns, PA-C   ANESTHESIA:   regional and general  EBL:  Total I/O In: 1100 [I.V.:1100] Out: 100 [Blood:100]  BLOOD ADMINISTERED:none  DRAINS: none   LOCAL MEDICATIONS USED:  MARCAINE     SPECIMEN:  No Specimen  DISPOSITION OF SPECIMEN:  N/A  COUNTS:  YES  TOURNIQUET:  * No tourniquets in log *  DICTATION: .Other Dictation: Dictation Number [id number]  PLAN OF CARE: Admit to inpatient   PATIENT DISPOSITION:  PACU - hemodynamically stable.   Delay start of Pharmacological VTE agent (>24hrs) due to surgical blood loss or risk of bleeding: not applicable

## 2014-07-07 NOTE — Anesthesia Preprocedure Evaluation (Signed)
Anesthesia Evaluation  Patient identified by MRN, date of birth, ID band Patient awake    Reviewed: Allergy & Precautions, NPO status , Patient's Chart, lab work & pertinent test results  Airway Mallampati: II  TM Distance: >3 FB Neck ROM: Full    Dental no notable dental hx.    Pulmonary COPD COPD inhaler, Current Smoker,  breath sounds clear to auscultation  Pulmonary exam normal       Cardiovascular hypertension, Pt. on medications + Peripheral Vascular Disease Rhythm:Regular Rate:Normal     Neuro/Psych negative neurological ROS  negative psych ROS   GI/Hepatic negative GI ROS, Neg liver ROS,   Endo/Other  negative endocrine ROS  Renal/GU negative Renal ROS  negative genitourinary   Musculoskeletal negative musculoskeletal ROS (+)   Abdominal   Peds negative pediatric ROS (+)  Hematology negative hematology ROS (+)   Anesthesia Other Findings   Reproductive/Obstetrics negative OB ROS                             Anesthesia Physical Anesthesia Plan  ASA: III  Anesthesia Plan: General   Post-op Pain Management:    Induction: Intravenous  Airway Management Planned: Oral ETT  Additional Equipment:   Intra-op Plan:   Post-operative Plan: Extubation in OR  Informed Consent: I have reviewed the patients History and Physical, chart, labs and discussed the procedure including the risks, benefits and alternatives for the proposed anesthesia with the patient or authorized representative who has indicated his/her understanding and acceptance.   Dental advisory given  Plan Discussed with: CRNA and Surgeon  Anesthesia Plan Comments:         Anesthesia Quick Evaluation

## 2014-07-07 NOTE — Transfer of Care (Signed)
Immediate Anesthesia Transfer of Care Note  Patient: Derrick Moon  Procedure(s) Performed: Procedure(s): RIGHT REVERSE TOTAL SHOULDER  (Right)  Patient Location: PACU  Anesthesia Type:General  Level of Consciousness: awake, alert , oriented and patient cooperative  Airway & Oxygen Therapy: Patient Spontanous Breathing and Patient connected to nasal cannula oxygen  Post-op Assessment: Report given to RN, Post -op Vital signs reviewed and stable and Patient moving all extremities  Post vital signs: Reviewed and stable  Last Vitals:  Filed Vitals:   07/07/14 1230  BP: 119/53  Pulse: 66  Temp:   Resp: 12    Complications: No apparent anesthesia complications

## 2014-07-07 NOTE — Progress Notes (Signed)
Utilization review completed.  

## 2014-07-08 LAB — BASIC METABOLIC PANEL
Anion gap: 11 (ref 5–15)
BUN: 13 mg/dL (ref 6–23)
CALCIUM: 8.8 mg/dL (ref 8.4–10.5)
CHLORIDE: 104 mmol/L (ref 96–112)
CO2: 25 mmol/L (ref 19–32)
Creatinine, Ser: 1.02 mg/dL (ref 0.50–1.35)
GFR calc Af Amer: 82 mL/min — ABNORMAL LOW (ref >90)
GFR, EST NON AFRICAN AMERICAN: 70 mL/min — AB (ref >90)
Glucose, Bld: 137 mg/dL — ABNORMAL HIGH (ref 70–99)
Potassium: 4.2 mmol/L (ref 3.5–5.1)
Sodium: 140 mmol/L (ref 135–145)

## 2014-07-08 LAB — HEMOGLOBIN AND HEMATOCRIT, BLOOD
HCT: 42.8 % (ref 39.0–52.0)
Hemoglobin: 13.5 g/dL (ref 13.0–17.0)

## 2014-07-08 NOTE — Progress Notes (Signed)
Orthopedics Progress Note  Subjective: I am ready to go home.  The shoulder hurts some.  Objective:  Filed Vitals:   07/08/14 0830  BP: 125/51  Pulse: 61  Temp: 97.7 F (36.5 C)  Resp: 14    General: Awake and alert  Musculoskeletal: right shoulder dressing CDI.  Neurovascularly intact  Lab Results  Component Value Date   WBC 9.9 06/27/2014   HGB 13.5 07/08/2014   HCT 42.8 07/08/2014   MCV 90.8 06/27/2014   PLT 274 06/27/2014       Component Value Date/Time   NA 140 07/08/2014 0540   K 4.2 07/08/2014 0540   CL 104 07/08/2014 0540   CO2 25 07/08/2014 0540   GLUCOSE 137* 07/08/2014 0540   BUN 13 07/08/2014 0540   CREATININE 1.02 07/08/2014 0540   CALCIUM 8.8 07/08/2014 0540   GFRNONAA 70* 07/08/2014 0540   GFRAA 82* 07/08/2014 0540    Lab Results  Component Value Date   INR 0.99 06/27/2014   INR 0.9 10/06/2008   INR 1.0 06/29/2008    Assessment/Plan: POD #1 s/p Procedure(s): RIGHT REVERSE TOTAL SHOULDER  D/C home. OT at home per home instructions Follow up in two weeks in the office  Remo Lipps R. Veverly Fells, MD 07/08/2014 10:32 AM

## 2014-07-08 NOTE — Discharge Instructions (Signed)
Ice to the shoulder as much as you can. Do exercises as instructed  Keep the arm positioned such that you can see your elbow at all times, (pillow behind the elbow in recliner)  Minimize the weight bearing with the right arm.  Not ok to drive.  DO NOT PUSH UP OUT OF A CHAIR WITH THE RIGHT ARM.  Wear the sling out of the home.  Keep the incision covered and clean and dry for one week, then ok to shower.  Follow up with Dr Veverly Fells in two weeks  [phone]

## 2014-07-08 NOTE — Evaluation (Signed)
Occupational Therapy Evaluation and Discharge Patient Details Name: Derrick Moon MRN: [id number] DOB: 1940/07/03 Today's Date: 07/08/2014    History of Present Illness Rt reverse TSA   Clinical Impression   This 74 yo male admitted with above presents to acute OT with all education completed with pt and his wife, no more OT needs, we will sign off.    Follow Up Recommendations  No OT follow up    Equipment Recommendations  None recommended by OT       Precautions / Restrictions Precautions Precautions: Shoulder Type of Shoulder Precautions: No A/PROM of shoulder per protocol; however after to speaking to Dr. Alonza Smoker reports that he can use his RUE to tolerance for simple ADLs (just no extension) Shoulder Interventions: Shoulder sling/immobilizer;Off for dressing/bathing/exercises Required Braces or Orthoses: Sling Restrictions Weight Bearing Restrictions: Yes RUE Weight Bearing: Non weight bearing      Mobility Bed Mobility Overal bed mobility: Modified Independent                Transfers Overall transfer level: Modified independent                               ision Additional Comments: No change from baseline          Pertinent Vitals/Pain Pain Assessment: 0-10 Pain Score: 3  Pain Location: right shoulder Pain Descriptors / Indicators: Aching Pain Intervention(s): Monitored during session;Repositioned     Hand Dominance Right   Extremity/Trunk Assessment Upper Extremity Assessment Upper Extremity Assessment: RUE deficits/detail RUE Deficits / Details: reverse TSA this admission, elbow to hand WNL RUE Coordination: decreased gross motor           Communication Communication Communication: No difficulties   Cognition Arousal/Alertness: Awake/alert Behavior During Therapy: WFL for tasks assessed/performed Overall Cognitive Status: Within Functional Limits for tasks assessed                        Exercises    Other Exercises Other Exercises: Educated pt on elbow, wrist, and hand exercises (10 reps 5x/day)   Shoulder Instructions Shoulder Instructions Donning/doffing shirt without moving shoulder: Caregiver independent with task Method for sponge bathing under operated UE: Caregiver independent with task Donning/doffing sling/immobilizer: Caregiver independent with task Correct positioning of sling/immobilizer: Caregiver independent with task Pendulum exercises (written home exercise program):  (NA) ROM for elbow, wrist and digits of operated UE: Supervision/safety Sling wearing schedule (on at all times/off for ADL's): Independent Dressing change:  (NA) Positioning of UE while sleeping: Caregiver independent with task    Home Living Family/patient expects to be discharged to:: Private residence Living Arrangements: Spouse/significant other Available Help at Discharge: Family;Available 24 hours/day Type of Home: House Home Access: Stairs to enter [employer] of Steps: 3 Entrance Stairs-Rails: Left Home Layout: One level     Bathroom Shower/Tub: Occupational psychologist: Standard     Home Equipment: None          Prior Functioning/Environment Level of Independence: Independent             OT Diagnosis: Generalized weakness;Acute pain   OT Problem List: Decreased range of motion;Pain      OT Goals(Current goals can be found in the care plan section) Acute Rehab OT Goals Patient Stated Goal: home today  OT Frequency:                End of Session Equipment  Utilized During Treatment:  (sling)  Activity Tolerance: Patient tolerated treatment well Patient left: in chair;with family/visitor present   Time: 7471-8550 OT Time Calculation (min): 33 min Charges:  OT General Charges $OT Visit: 1 Procedure OT Evaluation $Initial OT Evaluation Tier I: 1 Procedure OT Treatments $Self Care/Home Management : 8-22 mins  Almon Register  [phone] 07/08/2014, 11:40 AM

## 2014-07-08 NOTE — Discharge Summary (Signed)
Physician Discharge Summary   Patient ID: Derrick Moon MRN: [id number] DOB/AGE: 11-06-1940 74 y.o.  Admit date: 07-16-2014 Discharge date: 07/08/2014  Admission Diagnoses:  Active Problems:   S/P shoulder replacement   Discharge Diagnoses:  Same   Surgeries: Procedure(s): RIGHT REVERSE TOTAL SHOULDER  on 07-16-14   Consultants: OT  Discharged Condition: Stable  Hospital Course: Derrick Moon is an 74 y.o. male who was admitted 2014-07-16 with a chief complaint of right shoulder pain, and found to have a diagnosis of right shoulder rotator cuff tear arthropathy.  They were brought to the operating room on Jul 16, 2014 and underwent the above named procedures.    The patient had an uncomplicated hospital course and was stable for discharge.  Recent vital signs:  Filed Vitals:   07/08/14 0830  BP: 125/51  Pulse: 61  Temp: 97.7 F (36.5 C)  Resp: 14    Recent laboratory studies:  Results for orders placed or performed during the hospital encounter of 07-16-2014  Hemoglobin and hematocrit, blood  Result Value Ref Range   Hemoglobin 13.5 13.0 - 17.0 g/dL   HCT 42.8 39.0 - 66.4 %  Basic metabolic panel  Result Value Ref Range   Sodium 140 135 - 145 mmol/L   Potassium 4.2 3.5 - 5.1 mmol/L   Chloride 104 96 - 112 mmol/L   CO2 25 19 - 32 mmol/L   Glucose, Bld 137 (H) 70 - 99 mg/dL   BUN 13 6 - 23 mg/dL   Creatinine, Ser 1.02 0.50 - 1.35 mg/dL   Calcium 8.8 8.4 - 10.5 mg/dL   GFR calc non Af Amer 70 (L) >90 mL/min   GFR calc Af Amer 82 (L) >90 mL/min   Anion gap 11 5 - 15    Discharge Medications:     Medication List    TAKE these medications        albuterol 108 (90 BASE) MCG/ACT inhaler  Commonly known as:  PROVENTIL HFA;VENTOLIN HFA  Inhale 1-2 puffs into the lungs every 6 (six) hours as needed for wheezing or shortness of breath.     amLODipine 5 MG tablet  Commonly known as:  NORVASC  Take 5 mg by mouth daily.     lisinopril 40 MG tablet  Commonly  known as:  PRINIVIL,ZESTRIL  Take 40 mg by mouth daily.     methocarbamol 500 MG tablet  Commonly known as:  ROBAXIN  Take 1 tablet (500 mg total) by mouth 3 (three) times daily as needed.     oxyCODONE-acetaminophen 5-325 MG per tablet  Commonly known as:  ROXICET  Take 1-2 tablets by mouth every 4 (four) hours as needed for severe pain.     potassium citrate 10 MEQ (1080 MG) SR tablet  Commonly known as:  UROCIT-K  Twice daily.     tamsulosin 0.4 MG Caps capsule  Commonly known as:  FLOMAX  Take 0.4 mg by mouth daily.     vitamin C 500 MG tablet  Commonly known as:  ASCORBIC ACID  Take 500 mg by mouth daily.        Diagnostic Studies: Dg Shoulder Right Port  07-16-2014   CLINICAL DATA:  Status post reverse shoulder arthroplasty  EXAM: PORTABLE RIGHT SHOULDER - 2+ VIEW  COMPARISON:  None.  FINDINGS: The humeral and glenoid components appear well seated. No complicating features are demonstrated.  IMPRESSION: Well seated components of a reverse total shoulder arthroplasty.   Electronically Signed   By: Marijo Sanes  M.D.   On: 07/07/2014 14:34    Disposition: home        Follow-up Information    Follow up with Aloma Boch,STEVEN R, MD. Call in 2 weeks.   Specialty:  Orthopedic Surgery   Why:  [phone]   Contact information:   [address] [place] [address] [phone]        Signed: Augustin Schooling 07/08/2014, 10:35 AM

## 2014-07-10 ENCOUNTER — Encounter (HOSPITAL_COMMUNITY): Payer: Self-pay | Admitting: Orthopedic Surgery

## 2014-07-10 NOTE — Progress Notes (Signed)
Received a call from pt's wife stating that on the pt's D/C papers it said that he needed to wear his sling when he was out and about, but she was not sure about when he was just in the house. I told her that he could have it off intermittently in the house, but he needed to remember that when he had it off he should not be using that arm to push, pull, lift and that he could not do any movements that would push the shoulder joint forward (ie: reaching backwards or out to the side). I recommended that he should definitely wear it when he is taking a nap or sleeping at night. She also asked how much he could use the arm and I told her the restricted movements above, other than that he can use it for BADLs as tolerated.  [place], [place] [phone] 07/10/2014

## 2014-07-17 NOTE — Anesthesia Postprocedure Evaluation (Signed)
  Anesthesia Post-op Note  Patient: Derrick Moon  Procedure(s) Performed: Procedure(s) (LRB): RIGHT REVERSE TOTAL SHOULDER  (Right)  Patient Location: PACU  Anesthesia Type: GA combined with regional for post-op pain  Level of Consciousness: awake and alert   Airway and Oxygen Therapy: Patient Spontanous Breathing  Post-op Pain: mild  Post-op Assessment: Post-op Vital signs reviewed, Patient's Cardiovascular Status Stable, Respiratory Function Stable, Patent Airway and No signs of Nausea or vomiting  Last Vitals:  Filed Vitals:   07/08/14 0830  BP: 125/51  Pulse: 61  Temp: 36.5 C  Resp: 14    Post-op Vital Signs: stable   Complications: No apparent anesthesia complications

## 2015-02-12 ENCOUNTER — Ambulatory Visit (HOSPITAL_COMMUNITY)
Admission: RE | Admit: 2015-02-12 | Discharge: 2015-02-12 | Disposition: A | Discharge status: Home / Self Care | Payer: Medicare Other | Source: Ambulatory Visit | Attending: Internal Medicine | Admitting: Internal Medicine

## 2015-02-12 ENCOUNTER — Other Ambulatory Visit (HOSPITAL_COMMUNITY): Payer: Self-pay | Admitting: Internal Medicine

## 2015-02-12 DIAGNOSIS — J449 Chronic obstructive pulmonary disease, unspecified: Secondary | ICD-10-CM | POA: Insufficient documentation

## 2015-02-12 DIAGNOSIS — R05 Cough: Secondary | ICD-10-CM | POA: Diagnosis not present

## 2015-02-12 DIAGNOSIS — I7090 Unspecified atherosclerosis: Secondary | ICD-10-CM | POA: Diagnosis not present

## 2015-02-12 DIAGNOSIS — J948 Other specified pleural conditions: Secondary | ICD-10-CM | POA: Diagnosis not present

## 2015-02-12 DIAGNOSIS — R0602 Shortness of breath: Secondary | ICD-10-CM | POA: Insufficient documentation

## 2015-03-12 DIAGNOSIS — C672 Malignant neoplasm of lateral wall of bladder: Secondary | ICD-10-CM | POA: Insufficient documentation

## 2015-06-19 DIAGNOSIS — J200 Acute bronchitis due to Mycoplasma pneumoniae: Secondary | ICD-10-CM | POA: Diagnosis not present

## 2015-06-19 DIAGNOSIS — J449 Chronic obstructive pulmonary disease, unspecified: Secondary | ICD-10-CM | POA: Diagnosis not present

## 2015-07-24 DIAGNOSIS — M79673 Pain in unspecified foot: Secondary | ICD-10-CM | POA: Diagnosis not present

## 2015-07-24 DIAGNOSIS — I739 Peripheral vascular disease, unspecified: Secondary | ICD-10-CM | POA: Diagnosis not present

## 2015-07-24 DIAGNOSIS — L97511 Non-pressure chronic ulcer of other part of right foot limited to breakdown of skin: Secondary | ICD-10-CM | POA: Diagnosis not present

## 2015-07-27 ENCOUNTER — Other Ambulatory Visit: Payer: Self-pay

## 2015-07-27 ENCOUNTER — Encounter: Payer: Self-pay | Admitting: Vascular Surgery

## 2015-07-27 ENCOUNTER — Ambulatory Visit (HOSPITAL_COMMUNITY)
Admission: RE | Admit: 2015-07-27 | Discharge: 2015-07-27 | Disposition: A | Discharge status: Home / Self Care | Payer: PPO | Source: Ambulatory Visit | Attending: Vascular Surgery | Admitting: Vascular Surgery

## 2015-07-27 ENCOUNTER — Ambulatory Visit (INDEPENDENT_AMBULATORY_CARE_PROVIDER_SITE_OTHER): Payer: PPO | Admitting: Vascular Surgery

## 2015-07-27 VITALS — BP 138/70 | HR 70 | Temp 97.3°F | Resp 18 | Ht 71.5 in | Wt 199.0 lb

## 2015-07-27 DIAGNOSIS — I7025 Atherosclerosis of native arteries of other extremities with ulceration: Secondary | ICD-10-CM | POA: Insufficient documentation

## 2015-07-27 DIAGNOSIS — I77811 Abdominal aortic ectasia: Secondary | ICD-10-CM

## 2015-07-27 DIAGNOSIS — I70213 Atherosclerosis of native arteries of extremities with intermittent claudication, bilateral legs: Secondary | ICD-10-CM

## 2015-07-27 DIAGNOSIS — R938 Abnormal findings on diagnostic imaging of other specified body structures: Secondary | ICD-10-CM | POA: Diagnosis not present

## 2015-07-27 DIAGNOSIS — I739 Peripheral vascular disease, unspecified: Secondary | ICD-10-CM | POA: Diagnosis not present

## 2015-07-27 DIAGNOSIS — I10 Essential (primary) hypertension: Secondary | ICD-10-CM | POA: Insufficient documentation

## 2015-07-27 DIAGNOSIS — K219 Gastro-esophageal reflux disease without esophagitis: Secondary | ICD-10-CM | POA: Insufficient documentation

## 2015-07-27 NOTE — Progress Notes (Signed)
Referred by:  [name], MD [address] [place], [place] [address]  Reason for referral: bilateral leg pain  History of Present Illness  Derrick Moon is a 75 y.o. ([birth year]) male who presents with chief complaint: bilateral leg pain.  Onset of symptom occurred ~20 yards of ambulation.  Pain is described as cramping, severity 3-6/10, and associated with walking.  Patient has attempted to treat this pain with rest.  The patient has no rest pain symptoms also and ulcer between right 4th and 5th toes.  He denies any drainage or fever or chills.  Atherosclerotic risk factors include: HTN, prior to cigarette smoking, and continued tobacco use (pipe or cigar).  Pt is see Dr. Oneida Alar for a small AAA.  He has been lost to follow-up.   Past Medical History  Diagnosis Date  . AAA (abdominal aortic aneurysm) ([facility])   . Arthritis     Osteo  . Hypertension     takes Amlodipine and Lisinpril daily  . Enlarged prostate     takes Flomax daily  . COPD (chronic obstructive pulmonary disease) ([facility])     Albuterol inhaler as needed  . Pneumonia     several times with most recent within 5 yrs ago  . History of bronchitis     > 36yrs ago  . History of dizziness     several yrs ago-was on Meclizine but has been off for several yrs-no problems since  . AAA (abdominal aortic aneurysm) ([facility])   . Joint pain   . Joint swelling   . GERD (gastroesophageal reflux disease)     was on Prilosec but has been off for yrs-no issues now with reflux  . History of colon polyps     benign  . Urinary urgency   . Diverticulitis   . Nocturia   . Cataracts, bilateral     Past Surgical History  Procedure Laterality Date  . Foot surgery Right     x 5t  . Cholecystectomy  July 16,2010  . Rotator cuff repair      Bilateral   . Biceps tendon repair      LEFT  . Carpal tunnel release Right 2014  . Hand surgery Right   . Tumor removed from bladder    . Hernia repair Bilateral     inguinal  .  Colonoscopy    . Esophagogastroduodenoscopy    . Cystoscopy    . Reverse total shoulder arthroplasty Right 07/07/2014    Dr Veverly Fells  . Reverse shoulder arthroplasty Right 07/07/2014    Procedure: RIGHT REVERSE TOTAL SHOULDER ;  Surgeon: Netta Cedars, MD;  Location: [facility];  Service: Orthopedics;  Laterality: Right;    Social History   Social History  . Marital Status: Married    Spouse Name: N/A  . Number of Children: N/A  . Years of Education: N/A   Occupational History  . Not on file.   Social History Main Topics  . Smoking status: Current Some Day Smoker    Types: Cigars  . Smokeless tobacco: Never Used     Comment: quit smoking 6-8wks ago;1 a week if that much  . Alcohol Use: 0.0 oz/week    0 Standard drinks or equivalent per week     Comment: wine occasionally  . Drug Use: No  . Sexual Activity: Yes   Other Topics Concern  . Not on file   Social History Narrative    Family History  Problem Relation Age of Onset  .  Parkinson's disease Mother   . AAA (abdominal aortic aneurysm) Father   . Heart defect Sister     Congenital Heart Disease  . Heart disease Sister     Current Outpatient Prescriptions  Medication Sig Dispense Refill  . albuterol (PROVENTIL HFA;VENTOLIN HFA) 108 (90 BASE) MCG/ACT inhaler Inhale 1-2 puffs into the lungs every 6 (six) hours as needed for wheezing or shortness of breath.    [name] amLODipine (NORVASC) 5 MG tablet Take 5 mg by mouth daily.    . clindamycin (CLEOCIN) 150 MG capsule Take 150 mg by mouth 3 (three) times daily.    [name] lisinopril (PRINIVIL,ZESTRIL) 40 MG tablet Take 40 mg by mouth daily.    . Tamsulosin HCl (FLOMAX) 0.4 MG CAPS Take 0.4 mg by mouth daily.    [name] tiotropium (SPIRIVA) 18 MCG inhalation capsule Place 18 mcg into inhaler and inhale daily.    . vitamin C (ASCORBIC ACID) 500 MG tablet Take 500 mg by mouth daily.    . methocarbamol (ROBAXIN) 500 MG tablet Take 1 tablet (500 mg total) by mouth 3 (three) times daily as needed.  (Patient not taking: Reported on 07/27/2015) 60 tablet 1  . oxyCODONE-acetaminophen (ROXICET) 5-325 MG per tablet Take 1-2 tablets by mouth every 4 (four) hours as needed for severe pain. (Patient not taking: Reported on 07/27/2015) 60 tablet 0  . potassium citrate (UROCIT-K) 10 MEQ (1080 MG) SR tablet Twice daily. Reported on 07/27/2015     No current facility-administered medications for this visit.    Allergies  Allergen Reactions  . Aspartame And Phenylalanine Nausea Only  . Sulfonamide Derivatives Nausea And Vomiting  . Acyclovir And Related      REVIEW OF SYSTEMS:  (Positives checked otherwise negative)  CARDIOVASCULAR:   [ ]  chest pain,  [ ]  chest pressure,  [ ]  palpitations,  [ ]  shortness of breath when laying flat,  [x]  shortness of breath with exertion,   [x]  pain in feet when walking,  [x]  pain in feet when laying flat, [ ]  history of blood clot in veins (DVT),  [ ]  history of phlebitis,  [ ]  swelling in legs,  [ ]  varicose veins  PULMONARY:   [ ]  productive cough,  [ ]  asthma,  [ ]  wheezing  NEUROLOGIC:   [ ]  weakness in arms or legs,  [ ]  numbness in arms or legs,  [ ]  difficulty speaking or slurred speech,  [ ]  temporary loss of vision in one eye,  [ ]  dizziness  HEMATOLOGIC:   [ ]  bleeding problems,  [ ]  problems with blood clotting too easily  MUSCULOSKEL:   [ ]  joint pain, [ ]  joint swelling  GASTROINTEST:   [ ]  vomiting blood,  [ ]  blood in stool     GENITOURINARY:   [ ]  burning with urination,  [ ]  blood in urine  PSYCHIATRIC:   [ ]  history of major depression  INTEGUMENTARY:   [x]  rashes,  [x]  ulcers  CONSTITUTIONAL:   [ ]  fever,  [ ]  chills   For VQI Use Only  PRE-ADM LIVING: Home  AMB STATUS: Ambulatory  CAD Sx: None  PRIOR CHF: None  STRESS TEST: [x ] No, [ ]  Normal, [ ]  + ischemia, [ ]  + MI, [ ]  Both   Physical Examination  Filed Vitals:   07/27/15 0907  BP: 138/70  Pulse: 70  Temp: 97.3 F (36.3 C)  Resp:  18  Height: 5' 11.5" (1.816 m)  Weight: 199  lb (90.266 kg)  SpO2: 96%   Body mass index is 27.37 kg/(m^2).  General: A&O x 3, WDWN  Head: /AT  Ear/Nose/Throat: Hearing grossly intact, nares without erythema or drainage, oropharynx without Erythema/Exudate, Mallampati score: 3  Eyes: PERRLA, EOMI  Neck: Supple, no nuchal rigidity, no palpable LAD  Pulmonary: Sym exp, good air movt, CTAB, no rales, rhonchi, & wheezing  Cardiac: RRR, Nl S1, S2, no Murmurs, rubs or gallops  Vascular: Vessel Right Left  Radial Palpable Palpable  Brachial Palpable Palpable  Carotid Palpable, without bruit Palpable, without bruit  Aorta Not palpable N/A  Femoral Palpable Palpable  Popliteal Not palpable Not palpable  PT Not Palpable Faintly Palpable  DP Faintly Palpable Faintly Palpable   Gastrointestinal: soft, NTND, no G/R, no HSM, no masses, no CVAT B  Musculoskeletal: M/S 5/5 throughout , Extremities without ischemic changes, cyanotic foot that improves with elevation, superficial ulceration between R 4th/5th toe interspace, no drainage, smell, or bleeding, bilateral feet with deviated toes predisposing to friction against each other, callus overlying R 1st MT  Neurologic: CN 2-12 intact , Pain and light touch intact in extremities , Motor exam as listed above  Psychiatric: Judgment intact, Mood & affect appropriate for pt's clinical situation  Dermatologic: See M/S exam for extremity exam, no rashes otherwise noted  Lymph : No Cervical, Axillary, or Inguinal lymphadenopathy    Non-Invasive Vascular Imaging  ABI (Date: 07/27/2015)  R:   ABI: 0.40,   DP: mono,   PT: mono,   TBI: not detected  L:   ABI: 0.61,   DP: mono,   PT: mono,   TBI: 0.35   Medical Decision Making  Derrick Moon is a 75 y.o. male who presents with: atherosclerosis with R 4th/5th interspace ulceration   I discussed with the patient the natural history of critical limb ischemia: 25% require  amputation in one year, 50% are able to maintain their limbs in one year, and 25-30% die in one year due to comorbidities.  Given the limb threatening status of this patient, I recommend an aggressive work up including proceeding with an: Aortogram, Bilateral runoff and possible intervention RLE I discussed with the patient the nature of angiographic procedures, especially the limited patencies of any endovascular intervention. The patient is aware of that the risks of an angiographic procedure include but are not limited to: bleeding, infection, access site complications, embolization, rupture of treated vessel, dissection, possible need for emergent surgical intervention, and possible need for surgical procedures to treat the patient's pathology. The patient is aware of the risks and agrees to proceed.  The procedure is scheduled for: 11 MAY 17.  I discussed in depth with the patient the nature of atherosclerosis, and emphasized the importance of maximal medical management including strict control of blood pressure, blood glucose, and lipid levels, antiplatelet agents, obtaining regular exercise, and cessation of smoking.  The patient is aware that without maximal medical management the underlying atherosclerotic disease process will progress, limiting the benefit of any interventions. The patient is currently not on a statin:  Reportedly not medically indicated.  Lipids well controlled. The patient is currently not on an anti-platelet. The patient will be started on ASA 81 mg PO daily.  Thank you for allowing [place] to participate in this patient's care.  Adele Barthel, MD [facility] Office: [phone] Pager: [phone]  07/27/2015, 9:52 AM

## 2015-07-31 DIAGNOSIS — I739 Peripheral vascular disease, unspecified: Secondary | ICD-10-CM | POA: Diagnosis not present

## 2015-07-31 DIAGNOSIS — L97511 Non-pressure chronic ulcer of other part of right foot limited to breakdown of skin: Secondary | ICD-10-CM | POA: Diagnosis not present

## 2015-07-31 DIAGNOSIS — M79671 Pain in right foot: Secondary | ICD-10-CM | POA: Diagnosis not present

## 2015-08-02 ENCOUNTER — Other Ambulatory Visit: Payer: Self-pay

## 2015-08-02 ENCOUNTER — Ambulatory Visit (HOSPITAL_COMMUNITY)
Admission: RE | Admit: 2015-08-02 | Discharge: 2015-08-02 | Disposition: A | Discharge status: Home / Self Care | Payer: PPO | Source: Ambulatory Visit | Attending: Vascular Surgery | Admitting: Vascular Surgery

## 2015-08-02 ENCOUNTER — Encounter (HOSPITAL_COMMUNITY): Payer: Self-pay | Admitting: Vascular Surgery

## 2015-08-02 ENCOUNTER — Encounter (HOSPITAL_COMMUNITY)
Admission: RE | Disposition: A | Discharge status: Home / Self Care | Payer: Self-pay | Source: Ambulatory Visit | Attending: Vascular Surgery

## 2015-08-02 ENCOUNTER — Encounter: Payer: Medicare Other | Admitting: Vascular Surgery

## 2015-08-02 DIAGNOSIS — I70202 Unspecified atherosclerosis of native arteries of extremities, left leg: Secondary | ICD-10-CM | POA: Diagnosis not present

## 2015-08-02 DIAGNOSIS — Z8249 Family history of ischemic heart disease and other diseases of the circulatory system: Secondary | ICD-10-CM | POA: Insufficient documentation

## 2015-08-02 DIAGNOSIS — L97519 Non-pressure chronic ulcer of other part of right foot with unspecified severity: Secondary | ICD-10-CM | POA: Diagnosis not present

## 2015-08-02 DIAGNOSIS — Z01810 Encounter for preprocedural cardiovascular examination: Secondary | ICD-10-CM

## 2015-08-02 DIAGNOSIS — M1990 Unspecified osteoarthritis, unspecified site: Secondary | ICD-10-CM | POA: Insufficient documentation

## 2015-08-02 DIAGNOSIS — Z882 Allergy status to sulfonamides status: Secondary | ICD-10-CM | POA: Diagnosis not present

## 2015-08-02 DIAGNOSIS — F17210 Nicotine dependence, cigarettes, uncomplicated: Secondary | ICD-10-CM | POA: Insufficient documentation

## 2015-08-02 DIAGNOSIS — I739 Peripheral vascular disease, unspecified: Secondary | ICD-10-CM

## 2015-08-02 DIAGNOSIS — J449 Chronic obstructive pulmonary disease, unspecified: Secondary | ICD-10-CM | POA: Insufficient documentation

## 2015-08-02 DIAGNOSIS — I7025 Atherosclerosis of native arteries of other extremities with ulceration: Secondary | ICD-10-CM | POA: Diagnosis present

## 2015-08-02 DIAGNOSIS — K219 Gastro-esophageal reflux disease without esophagitis: Secondary | ICD-10-CM | POA: Insufficient documentation

## 2015-08-02 DIAGNOSIS — I10 Essential (primary) hypertension: Secondary | ICD-10-CM | POA: Diagnosis not present

## 2015-08-02 DIAGNOSIS — I70235 Atherosclerosis of native arteries of right leg with ulceration of other part of foot: Secondary | ICD-10-CM | POA: Insufficient documentation

## 2015-08-02 DIAGNOSIS — I714 Abdominal aortic aneurysm, without rupture: Secondary | ICD-10-CM | POA: Diagnosis not present

## 2015-08-02 HISTORY — PX: PERIPHERAL VASCULAR CATHETERIZATION: SHX172C

## 2015-08-02 LAB — POCT I-STAT, CHEM 8
BUN: 15 mg/dL (ref 6–20)
CHLORIDE: 104 mmol/L (ref 101–111)
Calcium, Ion: 1.23 mmol/L (ref 1.13–1.30)
Creatinine, Ser: 1 mg/dL (ref 0.61–1.24)
Glucose, Bld: 94 mg/dL (ref 65–99)
HCT: 49 % (ref 39.0–52.0)
HEMOGLOBIN: 16.7 g/dL (ref 13.0–17.0)
Potassium: 3.9 mmol/L (ref 3.5–5.1)
Sodium: 141 mmol/L (ref 135–145)
TCO2: 24 mmol/L (ref 0–100)

## 2015-08-02 SURGERY — ABDOMINAL AORTOGRAM W/LOWER EXTREMITY

## 2015-08-02 MED ORDER — HEPARIN (PORCINE) IN NACL 2-0.9 UNIT/ML-% IJ SOLN
INTRAMUSCULAR | Status: AC
  Filled 2015-08-02: qty 500

## 2015-08-02 MED ORDER — OXYCODONE-ACETAMINOPHEN 5-325 MG PO TABS
1.0000 | ORAL_TABLET | Freq: Four times a day (QID) | ORAL | Status: DC | PRN
  Administered 2015-08-02: 2 via ORAL

## 2015-08-02 MED ORDER — LIDOCAINE HCL (PF) 1 % IJ SOLN
INTRAMUSCULAR | Status: AC
  Filled 2015-08-02: qty 30

## 2015-08-02 MED ORDER — HEPARIN (PORCINE) IN NACL 2-0.9 UNIT/ML-% IJ SOLN
INTRAMUSCULAR | Status: DC | PRN
  Administered 2015-08-02: 1000 mL

## 2015-08-02 MED ORDER — MIDAZOLAM HCL 2 MG/2ML IJ SOLN
INTRAMUSCULAR | Status: DC | PRN
  Administered 2015-08-02: 1 mg via INTRAVENOUS

## 2015-08-02 MED ORDER — FENTANYL CITRATE (PF) 100 MCG/2ML IJ SOLN
INTRAMUSCULAR | Status: AC
  Filled 2015-08-02: qty 2

## 2015-08-02 MED ORDER — SODIUM CHLORIDE 0.9 % IV SOLN
INTRAVENOUS | Status: DC
  Administered 2015-08-02: 06:00:00 via INTRAVENOUS

## 2015-08-02 MED ORDER — MIDAZOLAM HCL 2 MG/2ML IJ SOLN
INTRAMUSCULAR | Status: AC
  Filled 2015-08-02: qty 2

## 2015-08-02 MED ORDER — LIDOCAINE HCL (PF) 1 % IJ SOLN
INTRAMUSCULAR | Status: DC | PRN
  Administered 2015-08-02: 20 mL

## 2015-08-02 MED ORDER — FENTANYL CITRATE (PF) 100 MCG/2ML IJ SOLN
INTRAMUSCULAR | Status: DC | PRN
  Administered 2015-08-02 (×2): 50 ug via INTRAVENOUS

## 2015-08-02 MED ORDER — MORPHINE SULFATE (PF) 2 MG/ML IV SOLN: 2.0000 mg | INTRAVENOUS | Status: DC | PRN

## 2015-08-02 MED ORDER — OXYCODONE-ACETAMINOPHEN 5-325 MG PO TABS
ORAL_TABLET | ORAL | Status: AC
  Filled 2015-08-02: qty 2

## 2015-08-02 MED ORDER — IODIXANOL 320 MG/ML IV SOLN
INTRAVENOUS | Status: DC | PRN
  Administered 2015-08-02: 117 mL via INTRA_ARTERIAL

## 2015-08-02 SURGICAL SUPPLY — 11 items
CATH ANGIO 5F BER2 65CM (CATHETERS) ×2 IMPLANT
CATH OMNI FLUSH 5F 65CM (CATHETERS) ×2 IMPLANT
COVER PRB 48X5XTLSCP FOLD TPE (BAG) IMPLANT
COVER PROBE 5X48 (BAG) ×3
KIT MICROINTRODUCER STIFF 5F (SHEATH) ×2 IMPLANT
KIT PV (KITS) ×3 IMPLANT
SHEATH PINNACLE 5F 10CM (SHEATH) ×2 IMPLANT
SYR MEDRAD MARK V 150ML (SYRINGE) ×3 IMPLANT
TRANSDUCER W/STOPCOCK (MISCELLANEOUS) ×3 IMPLANT
TRAY PV CATH (CUSTOM PROCEDURE TRAY) ×3 IMPLANT
WIRE BENTSON .035X145CM (WIRE) ×2 IMPLANT

## 2015-08-02 NOTE — Discharge Instructions (Signed)
Angiogram, Care After °Refer to this sheet in the next few weeks. These instructions provide you with information about caring for yourself after your procedure. Your health care provider may also give you more specific instructions. Your treatment has been planned according to current medical practices, but problems sometimes occur. Call your health care provider if you have any problems or questions after your procedure. °WHAT TO EXPECT AFTER THE PROCEDURE °After your procedure, it is typical to have the following: °· Bruising at the catheter insertion site that usually fades within 1-2 weeks. °· Blood collecting in the tissue (hematoma) that may be painful to the touch. It should usually decrease in size and tenderness within 1-2 weeks. °HOME CARE INSTRUCTIONS °· Take medicines only as directed by your health care provider. °· You may shower 24-48 hours after the procedure or as directed by your health care provider. Remove the bandage (dressing) and gently wash the site with plain soap and water. Pat the area dry with a clean towel. Do not rub the site, because this may cause bleeding. °· Do not take baths, swim, or use a hot tub until your health care provider approves. °· Check your insertion site every day for redness, swelling, or drainage. °· Do not apply powder or lotion to the site. °· Do not lift over 10 lb (4.5 kg) for 5 days after your procedure or as directed by your health care provider. °· Ask your health care provider when it is okay to: °¨ Return to work or school. °¨ Resume usual physical activities or sports. °¨ Resume sexual activity. °· Do not drive home if you are discharged the same day as the procedure. Have someone else drive you. °· You may drive 24 hours after the procedure unless otherwise instructed by your health care provider. °· Do not operate machinery or power tools for 24 hours after the procedure or as directed by your health care provider. °· If your procedure was done as an  outpatient procedure, which means that you went home the same day as your procedure, a responsible adult should be with you for the first 24 hours after you arrive home. °· Keep all follow-up visits as directed by your health care provider. This is important. °SEEK MEDICAL CARE IF: °· You have a fever. °· You have chills. °· You have increased bleeding from the catheter insertion site. Hold pressure on the site. °SEEK IMMEDIATE MEDICAL CARE IF: °· You have unusual pain at the catheter insertion site. °· You have redness, warmth, or swelling at the catheter insertion site. °· You have drainage (other than a small amount of blood on the dressing) from the catheter insertion site. °· The catheter insertion site is bleeding, and the bleeding does not stop after 30 minutes of holding steady pressure on the site. °· The area near or just beyond the catheter insertion site becomes pale, cool, tingly, or numb. °  °This information is not intended to replace advice given to you by your health care provider. Make sure you discuss any questions you have with your health care provider. °  °Document Released: 09/26/2004 Document Revised: 03/31/2014 Document Reviewed: 08/11/2012 °Elsevier Interactive Patient Education ©2016 [employer] ° °

## 2015-08-02 NOTE — Progress Notes (Signed)
Pt ambulates well without assist, states pain med relieved pain in right foot.

## 2015-08-02 NOTE — Interval H&P Note (Signed)
[facility]  History and Physical Update  The patient was interviewed and re-examined.  The patient's previous History and Physical has been reviewed and is unchanged from my consult.  There is no change in the plan of care: Aortogram, right leg runoff, and possible intervention.   I discussed with the patient the nature of angiographic procedures, especially the limited patencies of any endovascular intervention.    The patient is aware of that the risks of an angiographic procedure include but are not limited to: bleeding, infection, access site complications, renal failure, embolization, rupture of vessel, dissection, arteriovenous fistula, possible need for emergent surgical intervention, possible need for surgical procedures to treat the patient's pathology, anaphylactic reaction to contrast, and stroke and death.    The patient is aware of the risks and agrees to proceed. Adele Barthel, MD [facility] Office: [phone] Pager: [phone]  08/02/2015, 7:28 AM

## 2015-08-02 NOTE — H&P (View-Only) (Signed)
Referred by:  [name], MD [address] [place], [place] [address]  Reason for referral: bilateral leg pain  History of Present Illness  Derrick Moon is a 75 y.o. (1940-04-27) male who presents with chief complaint: bilateral leg pain.  Onset of symptom occurred ~20 yards of ambulation.  Pain is described as cramping, severity 3-6/10, and associated with walking.  Patient has attempted to treat this pain with rest.  The patient has no rest pain symptoms also and ulcer between right 4th and 5th toes.  He denies any drainage or fever or chills.  Atherosclerotic risk factors include: HTN, prior to cigarette smoking, and continued tobacco use (pipe or cigar).  Pt is see Dr. Oneida Alar for a small AAA.  He has been lost to follow-up.   Past Medical History  Diagnosis Date  . AAA (abdominal aortic aneurysm) ([facility])   . Arthritis     Osteo  . Hypertension     takes Amlodipine and Lisinpril daily  . Enlarged prostate     takes Flomax daily  . COPD (chronic obstructive pulmonary disease) ([facility])     Albuterol inhaler as needed  . Pneumonia     several times with most recent within 5 yrs ago  . History of bronchitis     > 38yrs ago  . History of dizziness     several yrs ago-was on Meclizine but has been off for several yrs-no problems since  . AAA (abdominal aortic aneurysm) ([facility])   . Joint pain   . Joint swelling   . GERD (gastroesophageal reflux disease)     was on Prilosec but has been off for yrs-no issues now with reflux  . History of colon polyps     benign  . Urinary urgency   . Diverticulitis   . Nocturia   . Cataracts, bilateral     Past Surgical History  Procedure Laterality Date  . Foot surgery Right     x 5t  . Cholecystectomy  July 16,2010  . Rotator cuff repair      Bilateral   . Biceps tendon repair      LEFT  . Carpal tunnel release Right 2014  . Hand surgery Right   . Tumor removed from bladder    . Hernia repair Bilateral     inguinal  .  Colonoscopy    . Esophagogastroduodenoscopy    . Cystoscopy    . Reverse total shoulder arthroplasty Right 07/07/2014    Dr Veverly Fells  . Reverse shoulder arthroplasty Right 07/07/2014    Procedure: RIGHT REVERSE TOTAL SHOULDER ;  Surgeon: Netta Cedars, MD;  Location: [facility];  Service: Orthopedics;  Laterality: Right;    Social History   Social History  . Marital Status: Married    Spouse Name: N/A  . Number of Children: N/A  . Years of Education: N/A   Occupational History  . Not on file.   Social History Main Topics  . Smoking status: Current Some Day Smoker    Types: Cigars  . Smokeless tobacco: Never Used     Comment: quit smoking 6-8wks ago;1 a week if that much  . Alcohol Use: 0.0 oz/week    0 Standard drinks or equivalent per week     Comment: wine occasionally  . Drug Use: No  . Sexual Activity: Yes   Other Topics Concern  . Not on file   Social History Narrative    Family History  Problem Relation Age of Onset  .  Parkinson's disease Mother   . AAA (abdominal aortic aneurysm) Father   . Heart defect Sister     Congenital Heart Disease  . Heart disease Sister     Current Outpatient Prescriptions  Medication Sig Dispense Refill  . albuterol (PROVENTIL HFA;VENTOLIN HFA) 108 (90 BASE) MCG/ACT inhaler Inhale 1-2 puffs into the lungs every 6 (six) hours as needed for wheezing or shortness of breath.    [name] amLODipine (NORVASC) 5 MG tablet Take 5 mg by mouth daily.    . clindamycin (CLEOCIN) 150 MG capsule Take 150 mg by mouth 3 (three) times daily.    [name] lisinopril (PRINIVIL,ZESTRIL) 40 MG tablet Take 40 mg by mouth daily.    . Tamsulosin HCl (FLOMAX) 0.4 MG CAPS Take 0.4 mg by mouth daily.    [name] tiotropium (SPIRIVA) 18 MCG inhalation capsule Place 18 mcg into inhaler and inhale daily.    . vitamin C (ASCORBIC ACID) 500 MG tablet Take 500 mg by mouth daily.    . methocarbamol (ROBAXIN) 500 MG tablet Take 1 tablet (500 mg total) by mouth 3 (three) times daily as needed.  (Patient not taking: Reported on 07/27/2015) 60 tablet 1  . oxyCODONE-acetaminophen (ROXICET) 5-325 MG per tablet Take 1-2 tablets by mouth every 4 (four) hours as needed for severe pain. (Patient not taking: Reported on 07/27/2015) 60 tablet 0  . potassium citrate (UROCIT-K) 10 MEQ (1080 MG) SR tablet Twice daily. Reported on 07/27/2015     No current facility-administered medications for this visit.    Allergies  Allergen Reactions  . Aspartame And Phenylalanine Nausea Only  . Sulfonamide Derivatives Nausea And Vomiting  . Acyclovir And Related      REVIEW OF SYSTEMS:  (Positives checked otherwise negative)  CARDIOVASCULAR:   [ ]  chest pain,  [ ]  chest pressure,  [ ]  palpitations,  [ ]  shortness of breath when laying flat,  [x]  shortness of breath with exertion,   [x]  pain in feet when walking,  [x]  pain in feet when laying flat, [ ]  history of blood clot in veins (DVT),  [ ]  history of phlebitis,  [ ]  swelling in legs,  [ ]  varicose veins  PULMONARY:   [ ]  productive cough,  [ ]  asthma,  [ ]  wheezing  NEUROLOGIC:   [ ]  weakness in arms or legs,  [ ]  numbness in arms or legs,  [ ]  difficulty speaking or slurred speech,  [ ]  temporary loss of vision in one eye,  [ ]  dizziness  HEMATOLOGIC:   [ ]  bleeding problems,  [ ]  problems with blood clotting too easily  MUSCULOSKEL:   [ ]  joint pain, [ ]  joint swelling  GASTROINTEST:   [ ]  vomiting blood,  [ ]  blood in stool     GENITOURINARY:   [ ]  burning with urination,  [ ]  blood in urine  PSYCHIATRIC:   [ ]  history of major depression  INTEGUMENTARY:   [x]  rashes,  [x]  ulcers  CONSTITUTIONAL:   [ ]  fever,  [ ]  chills   For VQI Use Only  PRE-ADM LIVING: Home  AMB STATUS: Ambulatory  CAD Sx: None  PRIOR CHF: None  STRESS TEST: [x ] No, [ ]  Normal, [ ]  + ischemia, [ ]  + MI, [ ]  Both   Physical Examination  Filed Vitals:   07/27/15 0907  BP: 138/70  Pulse: 70  Temp: 97.3 F (36.3 C)  Resp:  18  Height: 5' 11.5" (1.816 m)  Weight: 199  lb (90.266 kg)  SpO2: 96%   Body mass index is 27.37 kg/(m^2).  General: A&O x 3, WDWN  Head: Leonard/AT  Ear/Nose/Throat: Hearing grossly intact, nares without erythema or drainage, oropharynx without Erythema/Exudate, Mallampati score: 3  Eyes: PERRLA, EOMI  Neck: Supple, no nuchal rigidity, no palpable LAD  Pulmonary: Sym exp, good air movt, CTAB, no rales, rhonchi, & wheezing  Cardiac: RRR, Nl S1, S2, no Murmurs, rubs or gallops  Vascular: Vessel Right Left  Radial Palpable Palpable  Brachial Palpable Palpable  Carotid Palpable, without bruit Palpable, without bruit  Aorta Not palpable N/A  Femoral Palpable Palpable  Popliteal Not palpable Not palpable  PT Not Palpable Faintly Palpable  DP Faintly Palpable Faintly Palpable   Gastrointestinal: soft, NTND, no G/R, no HSM, no masses, no CVAT B  Musculoskeletal: M/S 5/5 throughout , Extremities without ischemic changes, cyanotic foot that improves with elevation, superficial ulceration between R 4th/5th toe interspace, no drainage, smell, or bleeding, bilateral feet with deviated toes predisposing to friction against each other, callus overlying R 1st MT  Neurologic: CN 2-12 intact , Pain and light touch intact in extremities , Motor exam as listed above  Psychiatric: Judgment intact, Mood & affect appropriate for pt's clinical situation  Dermatologic: See M/S exam for extremity exam, no rashes otherwise noted  Lymph : No Cervical, Axillary, or Inguinal lymphadenopathy    Non-Invasive Vascular Imaging  ABI (Date: 07/27/2015)  R:   ABI: 0.40,   DP: mono,   PT: mono,   TBI: not detected  L:   ABI: 0.61,   DP: mono,   PT: mono,   TBI: 0.35   Medical Decision Making  Derrick Moon is a 75 y.o. male who presents with: atherosclerosis with R 4th/5th interspace ulceration   I discussed with the patient the natural history of critical limb ischemia: 25% require  amputation in one year, 50% are able to maintain their limbs in one year, and 25-30% die in one year due to comorbidities.  Given the limb threatening status of this patient, I recommend an aggressive work up including proceeding with an: Aortogram, Bilateral runoff and possible intervention RLE I discussed with the patient the nature of angiographic procedures, especially the limited patencies of any endovascular intervention. The patient is aware of that the risks of an angiographic procedure include but are not limited to: bleeding, infection, access site complications, embolization, rupture of treated vessel, dissection, possible need for emergent surgical intervention, and possible need for surgical procedures to treat the patient's pathology. The patient is aware of the risks and agrees to proceed.  The procedure is scheduled for: 11 MAY 17.  I discussed in depth with the patient the nature of atherosclerosis, and emphasized the importance of maximal medical management including strict control of blood pressure, blood glucose, and lipid levels, antiplatelet agents, obtaining regular exercise, and cessation of smoking.  The patient is aware that without maximal medical management the underlying atherosclerotic disease process will progress, limiting the benefit of any interventions. The patient is currently not on a statin:  Reportedly not medically indicated.  Lipids well controlled. The patient is currently not on an anti-platelet. The patient will be started on ASA 81 mg PO daily.  Thank you for allowing [place] to participate in this patient's care.  Adele Barthel, MD [facility] Office: [phone] Pager: [phone]  07/27/2015, 9:52 AM

## 2015-08-02 NOTE — Op Note (Signed)
OPERATIVE NOTE   PROCEDURE: 1.  left common femoral artery cannulation under ultrasound guidance 2.  Placement of catheter in aorta 3.  Aortogram 4.  Conscious sedation for 24 minutes 5.  Bilateral leg runoff via catheter  PRE-OPERATIVE DIAGNOSIS: Right foot ulcer  POST-OPERATIVE DIAGNOSIS: same as above   SURGEON: Adele Barthel, MD  ANESTHESIA: conscious sedation  ESTIMATED BLOOD LOSS: 50 cc  CONTRAST: 117 cc  FINDING(S):  Aorta: patent, infrarenal abdominal aortic aneurysm noted  Superior mesenteric artery: not visualized  Celiac artery: not visualized   Right Left  RA patent patent  CIA Patent, calcified Patent, calcified  EIA Patent Patent, possible loop distally  IIA Patent Patent  CFA Patent Patent  SFA Patent proximally with occlusion of mid-segment, reconstitution of distal segment Flush occlusion  PFA patent patent  Pop patent patent  Trif patent patent  AT patent Patent, proximal segment not well visualized  Pero patent Patent, dominant runoff  PT patent Patent, miniscule vessel   SPECIMEN(S):  none  INDICATIONS:   Derrick Moon is a 75 y.o. male who presents with right foot ulcer.  The patient presents for: aortogram, bilateral leg runoff, and possible right leg intervention.  I discussed with the patient the nature of angiographic procedures, especially the limited patencies of any endovascular intervention.  The patient is aware of that the risks of an angiographic procedure include but are not limited to: bleeding, infection, access site complications, renal failure, embolization, rupture of vessel, dissection, possible need for emergent surgical intervention, possible need for surgical procedures to treat the patient's pathology, and stroke and death.  The patient is aware of the risks and agrees to proceed.  DESCRIPTION: After full informed consent was obtained from the patient, the patient was brought back to the angiography suite.  The patient was  placed supine upon the angiography table and connected to cardiopulmonary monitoring equipment.  The patient was then given conscious sedation, the amounts of which are documented in the patient's chart.  A circulating radiologic technician maintained continuous monitoring of the patient's cardiopulmonary status.  Additionally, the control room radiologic technician provided backup monitoring throughout the procedure.  The patient was prepped and drape in the standard fashion for an angiographic procedure.  At this point, attention was turned to the left groin.  Under ultrasound guidance, the subcutaneous tissue surrounding the left common femoral artery was anesthesized with 1% lidocaine with epinephrine.  The artery was then cannulated with a micropuncture needle.  The microwire was advanced into the iliac arterial system.  The needle was exchanged for a microsheath, which was loaded into the common femoral artery over the wire.  The microwire was exchanged for a [facility] wire which was advanced into the aorta.  The microsheath was then exchanged for a 5-Fr sheath which was loaded into the common femoral artery.  The Omniflush catheter was then loaded over the wire up to the level of L1.  This was very difficult due to a likely loop in the distal left external iliac artery.  The catheter was connected to the power injector circuit.  After de-airring and de-clotting the circuit, a power injector aortogram was completed.  The findings were listed above.  I pulled the catheter down to the distal aorta and bilateral automated leg runoffs were completed.  Based on the images, this patient might be a candidate for a endovascular recannulation of the left superficial femoral artery, but the artery is only 3.5 mm and the segment occluded appears to  be calcified.  This likely will require a stent placement to recannulate the superficial femoral artery.  It has been my experience that stents placed in 3.5 mm lumen have poor  long-term patency, so surgical bypass would give him a better long-term patency.  I replaced the wire into the catheter, straightening out the crook in the catheter.  Both were removed from the sheath together.  The sheath was aspirated.  No clots were present and the sheath was reloaded with heparinized saline.     COMPLICATIONS: none  CONDITION: stable  Adele Barthel, MD [facility] Office: [phone] Pager: [phone]  08/02/2015, 8:38 AM

## 2015-08-06 DIAGNOSIS — I70234 Atherosclerosis of native arteries of right leg with ulceration of heel and midfoot: Secondary | ICD-10-CM | POA: Diagnosis not present

## 2015-08-13 ENCOUNTER — Ambulatory Visit (INDEPENDENT_AMBULATORY_CARE_PROVIDER_SITE_OTHER): Payer: PPO | Admitting: Cardiovascular Disease

## 2015-08-13 ENCOUNTER — Telehealth: Payer: Self-pay | Admitting: Vascular Surgery

## 2015-08-13 ENCOUNTER — Encounter: Payer: Self-pay | Admitting: Cardiovascular Disease

## 2015-08-13 VITALS — BP 110/58 | HR 90 | Ht 71.0 in | Wt 195.0 lb

## 2015-08-13 DIAGNOSIS — Z01818 Encounter for other preprocedural examination: Secondary | ICD-10-CM

## 2015-08-13 DIAGNOSIS — I10 Essential (primary) hypertension: Secondary | ICD-10-CM

## 2015-08-13 DIAGNOSIS — R0609 Other forms of dyspnea: Secondary | ICD-10-CM

## 2015-08-13 DIAGNOSIS — I739 Peripheral vascular disease, unspecified: Secondary | ICD-10-CM | POA: Diagnosis not present

## 2015-08-13 NOTE — Progress Notes (Signed)
Patient ID: Derrick Moon, male   DOB: May 09, 1940, 75 y.o.   MRN: [id number]       CARDIOLOGY CONSULT NOTE  Patient ID: Derrick Moon MRN: [id number] DOB/AGE: 26-Jun-1940 75 y.o.  Admit date: (Not on file) Primary Physician: [name], MD Referring Physician:   Reason for Consultation: preop clearance  HPI: The patient is a 75 year old male with a history of hypertension and COPD as well as a small abdominal aortic aneurysm. He underwent abnormal ABIs which led to lower extremity angiography which demonstrated bilateral superficial femoral artery occlusions with extensive profunda collaterals in both legs and patent runoff in right leg. There was patent runoff in the left leg with evidence of diffuse proximal disease in the trifurcation with bilateral feet perfusion.  He presents today for preoperative risk stratification.  ECG 08/02/15 which I personally interpreted demonstrates sinus rhythm with possible old septal infarct.  No records of echocardiography or stress testing in the system.  He denies chest pain. He used to smoke 1 to 3 packs of cigarettes daily for 50 years but quit 27 years ago. He now smokes a pipe and even quit smoking cigars. He and his wife had the flu on Christmas and he has had progressive exertional dyspnea since that time. 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 He said he really hasn't felt very well for the past year but the shortness of breath has been more recent. He has had right rotator cuff surgery and has left shoulder pain.   Allergies  Allergen Reactions  . Aspartame And Phenylalanine Nausea Only  . Sulfonamide Derivatives Nausea And Vomiting    Current Outpatient Prescriptions  Medication Sig Dispense Refill  . albuterol (PROVENTIL HFA;VENTOLIN HFA) 108 (90 BASE) MCG/ACT inhaler Inhale 1-2 puffs into the lungs every 6 (six) hours as needed for  wheezing or shortness of breath.    [name] amLODipine (NORVASC) 5 MG tablet Take 5 mg by mouth daily.    . fluticasone (FLOVENT HFA) 110 MCG/ACT inhaler Inhale 1 puff into the lungs 2 (two) times daily.    [name] lisinopril (PRINIVIL,ZESTRIL) 40 MG tablet Take 40 mg by mouth daily.    [name] oxyCODONE (OXY IR/ROXICODONE) 5 MG immediate release tablet Take 5 mg by mouth at bedtime as needed for severe pain.    . Tamsulosin HCl (FLOMAX) 0.4 MG CAPS Take 0.4 mg by mouth 2 (two) times daily.     [name] tiotropium (SPIRIVA) 18 MCG inhalation capsule Place 18 mcg into inhaler and inhale daily.    . vitamin C (ASCORBIC ACID) 500 MG tablet Take 500 mg by mouth daily.     No current facility-administered medications for this visit.    Past Medical History  Diagnosis Date  . AAA (abdominal aortic aneurysm) ([facility])   . Arthritis     Osteo  . Hypertension     takes Amlodipine and Lisinpril daily  . Enlarged prostate     takes Flomax daily  . COPD (chronic obstructive pulmonary disease) ([facility])     Albuterol inhaler as needed  . Pneumonia     several times with most recent within 5 yrs ago  . History of bronchitis     > 31yrs ago  . History of dizziness     several yrs ago-was on Meclizine but has been off for several yrs-no problems since  . AAA (abdominal aortic aneurysm) ([facility])   .  Joint pain   . Joint swelling   . GERD (gastroesophageal reflux disease)     was on Prilosec but has been off for yrs-no issues now with reflux  . History of colon polyps     benign  . Urinary urgency   . Diverticulitis   . Nocturia   . Cataracts, bilateral     Past Surgical History  Procedure Laterality Date  . Foot surgery Right     x 5t  . Cholecystectomy  July 16,2010  . Rotator cuff repair      Bilateral   . Biceps tendon repair      LEFT  . Carpal tunnel release Right 2014  . Hand surgery Right   . Tumor removed from bladder    . Hernia repair Bilateral     inguinal  . Colonoscopy    .  Esophagogastroduodenoscopy    . Cystoscopy    . Reverse total shoulder arthroplasty Right 07/07/2014    Dr Veverly Fells  . Reverse shoulder arthroplasty Right 07/07/2014    Procedure: RIGHT REVERSE TOTAL SHOULDER ;  Surgeon: Netta Cedars, MD;  Location: [facility];  Service: Orthopedics;  Laterality: Right;  . Peripheral vascular catheterization N/A 08/02/2015    Procedure: Abdominal Aortogram w/Lower Extremity;  Surgeon: Conrad , MD;  Location: [facility];  Service: Cardiovascular;  Laterality: N/A;    Social History   Social History  . Marital Status: Married    Spouse Name: N/A  . Number of Children: N/A  . Years of Education: N/A   Occupational History  . Not on file.   Social History Main Topics  . Smoking status: Current Some Day Smoker    Types: Cigars  . Smokeless tobacco: Never Used     Comment: quit smoking 6-8wks ago;1 a week if that much  . Alcohol Use: 0.0 oz/week    0 Standard drinks or equivalent per week     Comment: wine occasionally  . Drug Use: No  . Sexual Activity: Yes   Other Topics Concern  . Not on file   Social History Narrative     No family history of premature CAD in 1st degree relatives.  Prior to Admission medications   Medication Sig Start Date End Date Taking? Authorizing Provider  albuterol (PROVENTIL HFA;VENTOLIN HFA) 108 (90 BASE) MCG/ACT inhaler Inhale 1-2 puffs into the lungs every 6 (six) hours as needed for wheezing or shortness of breath.   Yes Historical Provider, MD  amLODipine (NORVASC) 5 MG tablet Take 5 mg by mouth daily.   Yes Historical Provider, MD  fluticasone (FLOVENT HFA) 110 MCG/ACT inhaler Inhale 1 puff into the lungs 2 (two) times daily.   Yes Historical Provider, MD  lisinopril (PRINIVIL,ZESTRIL) 40 MG tablet Take 40 mg by mouth daily.   Yes Historical Provider, MD  oxyCODONE (OXY IR/ROXICODONE) 5 MG immediate release tablet Take 5 mg by mouth at bedtime as needed for severe pain.   Yes Historical Provider, MD    Tamsulosin HCl (FLOMAX) 0.4 MG CAPS Take 0.4 mg by mouth 2 (two) times daily.    Yes Historical Provider, MD  tiotropium (SPIRIVA) 18 MCG inhalation capsule Place 18 mcg into inhaler and inhale daily.   Yes Historical Provider, MD  vitamin C (ASCORBIC ACID) 500 MG tablet Take 500 mg by mouth daily.   Yes Historical Provider, MD     Review of systems complete and found to be negative unless listed above in HPI  Physical exam Blood pressure 110/58, pulse 90, height 5\' 11"  (1.803 m), weight 195 lb (88.451 kg), SpO2 93 %. General: NAD Neck: No JVD, no thyromegaly or thyroid nodule.  Lungs: Clear to auscultation bilaterally with normal respiratory effort. CV: Nondisplaced PMI. Regular rate and rhythm, normal S1/S2, no S3/S4, soft apical holosystolic murmur.  No peripheral edema.  No carotid bruit.  Right foot boot. Abdomen: Soft, nontender, no distention.  Neurologic: Alert and oriented x 3.  Psych: Normal affect. HEENT: Normal.   ECG: Most recent ECG reviewed.  Labs:   Lab Results  Component Value Date   WBC 9.9 06/27/2014   HGB 16.7 08/02/2015   HCT 49.0 08/02/2015   MCV 90.8 06/27/2014   PLT 274 06/27/2014   No results for input(s): NA, K, CL, CO2, BUN, CREATININE, CALCIUM, PROT, BILITOT, ALKPHOS, ALT, AST, GLUCOSE in the last 168 hours.  Invalid input(s): LABALBU Lab Results  Component Value Date   CKTOTAL 470* 06/29/2008   CKMB 3.2 06/29/2008   TROPONINI <0.01        NO INDICATION OF MYOCARDIAL INJURY. 06/29/2008    Lab Results  Component Value Date   CHOL  06/29/2008    153        ATP III CLASSIFICATION:  <200     mg/dL   Desirable  200-239  mg/dL   Borderline High  >=240    mg/dL   High          Lab Results  Component Value Date   HDL 29* 06/29/2008   Lab Results  Component Value Date   LDLCALC  06/29/2008    99        Total Cholesterol/HDL:CHD Risk Coronary Heart Disease Risk Table                     Men   Women  1/2 Average Risk   3.4   3.3   Average Risk       5.0   4.4  2 X Average Risk   9.6   7.1  3 X Average Risk  23.4   11.0        Use the calculated Patient Ratio above and the CHD Risk Table to determine the patient's CHD Risk.        ATP III CLASSIFICATION (LDL):  <100     mg/dL   Optimal  100-129  mg/dL   Near or Above                    Optimal  130-159  mg/dL   Borderline  160-189  mg/dL   High  >190     mg/dL   Very High   Lab Results  Component Value Date   TRIG 123 06/29/2008   Lab Results  Component Value Date   CHOLHDL 5.3 06/29/2008   No results found for: LDLDIRECT       Studies: No results found.  ASSESSMENT AND PLAN:  1. Progressive exertional dyspnea/Preoperative risk stratification: While symptoms may be related to progressive COPD or scarring of lung tissue, he has several risk factors for ischemic heart disease. I will proceed with a nuclear myocardial perfusion imaging study (Lexiscan) to evaluate for ischemic heart disease.  2. PVD: As per vascular surgery. Being scheduled to undergo surgery. I have recommended he start taking ASA 81 mg daily. In theory, would also benefit from statin therapy.  3. Essential HTN: Controlled. No changes.  Dispo: fu to be determined.  Signed: Kate Sable, M.D., F.A.C.C.  08/13/2015, 9:25 AM

## 2015-08-13 NOTE — Patient Instructions (Signed)
Your physician recommends that you schedule a follow-up appointment in: to be determined after test    Your physician has requested that you have a lexiscan myoview. For further information please visit [email or web address]. Please follow instruction sheet, as given.  Your physician recommends that you continue on your current medications as directed. Please refer to the Current Medication list given to you today.   If you need a refill on your cardiac medications before your next appointment, please call your pharmacy.    Thank you for choosing [facility] !

## 2015-08-13 NOTE — Telephone Encounter (Signed)
Sched cardio appt 5/22 at 9:20 with dr. Jacinta Shoe. Sched lab 08/30/15 at 9 and md 09/07/15 at 1:45. Lm on hm# to inform pt.

## 2015-08-13 NOTE — Telephone Encounter (Signed)
-----   Message from Denman George, RN sent at 08/02/2015 11:48 AM EDT ----- Regarding: needs Cardiol Appt. ASAP and 2-4 wk f/u with BLC with GSV mapping  Planning for (R) Fem-Pop BP  ----- Message -----    From: Conrad [place], MD    Sent: 08/02/2015   8:47 AM      To: [address]  Derrick Moon [id number] Jun 26, 1940  PROCEDURE: 1.  left common femoral artery cannulation under ultrasound guidance 2.  Placement of catheter in aorta 3.  Aortogram 4.  Conscious sedation for 24 minutes 5.  Bilateral leg runoff via catheter   Follow-up: 2-4 weeks (after Cardiology appt)  Orders(s) for follow-up:  1.  Needs ASAP cardiology evaluation for preop clearance for a R fem-pop bypass 2.  BLE GSV mapping

## 2015-08-15 ENCOUNTER — Encounter (HOSPITAL_COMMUNITY)
Admission: RE | Admit: 2015-08-15 | Discharge: 2015-08-15 | Disposition: A | Discharge status: Home / Self Care | Payer: PPO | Source: Ambulatory Visit | Attending: Cardiovascular Disease | Admitting: Cardiovascular Disease

## 2015-08-15 ENCOUNTER — Telehealth: Payer: Self-pay

## 2015-08-15 ENCOUNTER — Encounter (HOSPITAL_COMMUNITY): Payer: Self-pay

## 2015-08-15 ENCOUNTER — Inpatient Hospital Stay (HOSPITAL_COMMUNITY): Admission: RE | Admit: 2015-08-15 | Payer: PPO | Source: Ambulatory Visit

## 2015-08-15 DIAGNOSIS — R0609 Other forms of dyspnea: Secondary | ICD-10-CM | POA: Diagnosis not present

## 2015-08-15 DIAGNOSIS — I510 Cardiac septal defect, acquired: Secondary | ICD-10-CM | POA: Insufficient documentation

## 2015-08-15 DIAGNOSIS — Z01818 Encounter for other preprocedural examination: Secondary | ICD-10-CM

## 2015-08-15 LAB — NM MYOCAR MULTI W/SPECT W/WALL MOTION / EF
CHL CUP NUCLEAR SDS: 1
CHL CUP NUCLEAR SRS: 3
CHL CUP NUCLEAR SSS: 4
CHL CUP RESTING HR STRESS: 65 {beats}/min
CSEPPHR: 91 {beats}/min
LHR: 0.33
LV sys vol: 30 mL
LVDIAVOL: 89 mL (ref 62–150)
NUC STRESS TID: 0.96

## 2015-08-15 MED ORDER — REGADENOSON 0.4 MG/5ML IV SOLN
INTRAVENOUS | Status: AC
  Administered 2015-08-15: 0.4 mg via INTRAVENOUS
  Filled 2015-08-15: qty 5

## 2015-08-15 MED ORDER — SODIUM CHLORIDE 0.9% FLUSH
INTRAVENOUS | Status: AC
  Filled 2015-08-15: qty 100

## 2015-08-15 MED ORDER — TECHNETIUM TC 99M TETROFOSMIN IV KIT
30.0000 | PACK | Freq: Once | INTRAVENOUS | Status: AC | PRN
  Administered 2015-08-15: 30 via INTRAVENOUS

## 2015-08-15 MED ORDER — SODIUM CHLORIDE 0.9% FLUSH
INTRAVENOUS | Status: AC
Start: 2015-08-15 — End: 2015-08-15
  Administered 2015-08-15: 10 mL via INTRAVENOUS
  Filled 2015-08-15: qty 10

## 2015-08-15 MED ORDER — TECHNETIUM TC 99M TETROFOSMIN IV KIT
10.0000 | PACK | Freq: Once | INTRAVENOUS | Status: AC | PRN
  Administered 2015-08-15: 10 via INTRAVENOUS

## 2015-08-15 NOTE — Telephone Encounter (Signed)
Yes, can proceed.

## 2015-08-15 NOTE — Telephone Encounter (Signed)
-----   Message from Herminio Commons, MD sent at 08/15/2015  1:01 PM EDT ----- Low risk.

## 2015-08-15 NOTE — Telephone Encounter (Signed)
Called home / cell- no answer, lmtcb 5/24

## 2015-08-15 NOTE — Telephone Encounter (Signed)
Gave pt results to his stress test, and he would like to know if he can go ahead with his surgery. ? Please advise.

## 2015-08-16 ENCOUNTER — Encounter: Payer: Self-pay | Admitting: Podiatry

## 2015-08-16 NOTE — Telephone Encounter (Signed)
Pt asked me to let Dr. Lianne Moris office know he was ok for surgery. I sent them a fax letting them know Dr. Bronson Ing said he was ok to proceed with surgery.

## 2015-08-28 DIAGNOSIS — I739 Peripheral vascular disease, unspecified: Secondary | ICD-10-CM | POA: Diagnosis not present

## 2015-08-28 DIAGNOSIS — L97511 Non-pressure chronic ulcer of other part of right foot limited to breakdown of skin: Secondary | ICD-10-CM | POA: Diagnosis not present

## 2015-08-30 ENCOUNTER — Ambulatory Visit (HOSPITAL_COMMUNITY)
Admission: RE | Admit: 2015-08-30 | Discharge: 2015-08-30 | Disposition: A | Discharge status: Home / Self Care | Payer: PPO | Source: Ambulatory Visit | Attending: Vascular Surgery | Admitting: Vascular Surgery

## 2015-08-30 DIAGNOSIS — I739 Peripheral vascular disease, unspecified: Secondary | ICD-10-CM

## 2015-08-30 DIAGNOSIS — Z01810 Encounter for preprocedural cardiovascular examination: Secondary | ICD-10-CM | POA: Diagnosis not present

## 2015-08-30 DIAGNOSIS — K219 Gastro-esophageal reflux disease without esophagitis: Secondary | ICD-10-CM | POA: Diagnosis not present

## 2015-08-30 DIAGNOSIS — I10 Essential (primary) hypertension: Secondary | ICD-10-CM | POA: Insufficient documentation

## 2015-09-03 ENCOUNTER — Encounter: Payer: Self-pay | Admitting: Vascular Surgery

## 2015-09-07 ENCOUNTER — Encounter: Payer: Self-pay | Admitting: Vascular Surgery

## 2015-09-07 ENCOUNTER — Other Ambulatory Visit: Payer: Self-pay

## 2015-09-07 ENCOUNTER — Ambulatory Visit (INDEPENDENT_AMBULATORY_CARE_PROVIDER_SITE_OTHER): Payer: PPO | Admitting: Vascular Surgery

## 2015-09-07 VITALS — BP 116/69 | HR 71 | Ht 71.5 in | Wt 197.0 lb

## 2015-09-07 DIAGNOSIS — I7025 Atherosclerosis of native arteries of other extremities with ulceration: Secondary | ICD-10-CM | POA: Diagnosis not present

## 2015-09-07 NOTE — Progress Notes (Signed)
Established Critical Limb Ischemia Patient  History of Present Illness  Derrick Moon is a 75 y.o. (03-Sep-1940) male who presents with chief complaint: right foot pain and cramping.  The patient does not have rest pain but has intermittent pain in his R 4th/5th toe ulcer.  His ulceration has nearly healed.  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  The patient notes symptoms have not progressed.  The patient's treatment regimen currently included: maximal medical management and wound care.    He has been cleared by Cardiology to proceed with bypass.  Past Medical History  Diagnosis Date  . AAA (abdominal aortic aneurysm) ([place])   . Arthritis     Osteo  . Hypertension     takes Amlodipine and Lisinpril daily  . Enlarged prostate     takes Flomax daily  . COPD (chronic obstructive pulmonary disease) ([facility])     Albuterol inhaler as needed  . Pneumonia     several times with most recent within 5 yrs ago  . History of bronchitis     > 23yrs ago  . History of dizziness     several yrs ago-was on Meclizine but has been off for several yrs-no problems since  . AAA (abdominal aortic aneurysm) ([place])   . Joint pain   . Joint swelling   . GERD (gastroesophageal reflux disease)     was on Prilosec but has been off for yrs-no issues now with reflux  . History of colon polyps     benign  . Urinary urgency   . Diverticulitis   . Nocturia   . Cataracts, bilateral     Past Surgical History  Procedure Laterality Date  . Foot surgery Right     x 5t  . Cholecystectomy  July 16,2010  . Rotator cuff repair      Bilateral   . Biceps tendon repair      LEFT  . Carpal tunnel release Right 2014  . Hand surgery Right   . Tumor removed from bladder    . Hernia repair Bilateral     inguinal  . Colonoscopy    . Esophagogastroduodenoscopy    . Cystoscopy    . Reverse total shoulder arthroplasty Right 07/07/2014    Dr Veverly Fells  . Reverse shoulder arthroplasty  Right 07/07/2014    Procedure: RIGHT REVERSE TOTAL SHOULDER ;  Surgeon: Netta Cedars, MD;  Location: [facility];  Service: Orthopedics;  Laterality: Right;  . Peripheral vascular catheterization N/A 08/02/2015    Procedure: Abdominal Aortogram w/Lower Extremity;  Surgeon: Conrad [place], MD;  Location: [facility];  Service: Cardiovascular;  Laterality: N/A;    Social History   Social History  . Marital Status: Married    Spouse Name: N/A  . Number of Children: N/A  . Years of Education: N/A   Occupational History  . Not on file.   Social History Main Topics  . Smoking status: Current Some Day Smoker    Types: Cigars  . Smokeless tobacco: Never Used     Comment: quit smoking 6-8wks ago;1 a week if that much  . Alcohol Use: 0.0 oz/week    0 Standard drinks or equivalent per week     Comment: wine occasionally  . Drug Use: No  . Sexual Activity: Yes   Other Topics Concern  . Not on file   Social History Narrative    Family History  Problem Relation Age of Onset  .  Parkinson's disease Mother   . AAA (abdominal aortic aneurysm) Father   . Heart defect Sister     Congenital Heart Disease  . Heart disease Sister     Current Outpatient Prescriptions  Medication Sig Dispense Refill  . albuterol (PROVENTIL HFA;VENTOLIN HFA) 108 (90 BASE) MCG/ACT inhaler Inhale 1-2 puffs into the lungs every 6 (six) hours as needed for wheezing or shortness of breath.    [name] amLODipine (NORVASC) 5 MG tablet Take 5 mg by mouth daily.    . fluticasone (FLOVENT HFA) 110 MCG/ACT inhaler Inhale 1 puff into the lungs 2 (two) times daily.    [name] lisinopril (PRINIVIL,ZESTRIL) 40 MG tablet Take 40 mg by mouth daily.    [name] oxyCODONE (OXY IR/ROXICODONE) 5 MG immediate release tablet Take 5 mg by mouth at bedtime as needed for severe pain.    . Tamsulosin HCl (FLOMAX) 0.4 MG CAPS Take 0.4 mg by mouth 2 (two) times daily.     [name] tiotropium (SPIRIVA) 18 MCG inhalation capsule Place 18 mcg into inhaler and inhale  daily.    . vitamin C (ASCORBIC ACID) 500 MG tablet Take 500 mg by mouth daily.     No current facility-administered medications for this visit.     Allergies  Allergen Reactions  . Aspartame And Phenylalanine Nausea Only  . Sulfonamide Derivatives Nausea And Vomiting     REVIEW OF SYSTEMS:  (Positives checked otherwise negative)  CARDIOVASCULAR:   [ ]  chest pain,  [ ]  chest pressure,  [ ]  palpitations,  [ ]  shortness of breath when laying flat,  [x]  shortness of breath with exertion,   [x]  pain in feet when walking,  [x]  pain in feet when laying flat, [ ]  history of blood clot in veins (DVT),  [ ]  history of phlebitis,  [ ]  swelling in legs,  [ ]  varicose veins  PULMONARY:   [ ]  productive cough,  [ ]  asthma,  [ ]  wheezing [x]  COPD  NEUROLOGIC:   [ ]  weakness in arms or legs,  [ ]  numbness in arms or legs,  [ ]  difficulty speaking or slurred speech,  [ ]  temporary loss of vision in one eye,  [ ]  dizziness  HEMATOLOGIC:   [ ]  bleeding problems,  [ ]  problems with blood clotting too easily  MUSCULOSKEL:   [ ]  joint pain, [ ]  joint swelling  GASTROINTEST:   [ ]  vomiting blood,  [ ]  blood in stool     GENITOURINARY:   [ ]  burning with urination,  [ ]  blood in urine  PSYCHIATRIC:   [ ]  history of major depression  INTEGUMENTARY:   [ ]  rashes,  [x]  ulcers  CONSTITUTIONAL:   [ ]  fever,  [ ]  chills     Physical Examination  Filed Vitals:   09/07/15 1319  BP: 116/69  Pulse: 71  Height: 5' 11.5" (1.816 m)  Weight: 197 lb (89.359 kg)  SpO2: 93%   Body mass index is 27.1 kg/(m^2).  General: A&O x 3, WDWN  Eyes: PERRLA, EOMI  Pulmonary: Sym exp, good air movt, CTAB, no rales, rhonchi, & wheezing  Cardiac: RRR, Nl S1, S2, no Murmurs, rubs or gallops  Vascular: Vessel Right Left  Radial Palpable Palpable  Brachial Palpable Palpable  Carotid Palpable, without bruit Palpable, without bruit  Aorta Not palpable N/A  Femoral Palpable  Palpable  Popliteal Not palpable Not palpable  PT Not Palpable Not Palpable  DP Not Palpable Palpable   Gastrointestinal: soft, NTND,  no G/R, no HSM, no masses, no CVAT B  Musculoskeletal: M/S 5/5 throughout , Extremities without ischemic changes   Neurologic: Pain and light touch intact in extremities , Motor exam as listed above  Non-Invasive Vascular Imaging BLE GSV mapping (08/30/15): adequate GSV throughout R leg, likely ok vein in L thigh   Medical Decision Making  RHEN LORDAN is a 75 y.o. male who presents with: RLE critical limb ischemia with healing R 4th/5th toe ulcer   Based on the patient's vascular studies and examination, I have offered the patient: R CFA to AK pop BPG with ips GSV.  He is scheduled for 28 JUN 17. The risk, benefits, and alternative for bypass operations were discussed with the patient.   The patient is aware the risks include but are not limited to: bleeding, infection, myocardial infarction, stroke, limb loss, nerve damage, need for additional procedures in the future, wound complications, and inability to complete the bypass.  The patient is aware of these risks and agreed to proceed. I discussed in depth with the patient the nature of atherosclerosis, and emphasized the importance of maximal medical management including strict control of blood pressure, blood glucose, and lipid levels, antiplatelet agents, obtaining regular exercise, and cessation of smoking.    The patient is aware that without maximal medical management the underlying atherosclerotic disease process will progress, limiting the benefit of any interventions. The patient is currently not on a statin: not medically indicated. The patient is currently on an anti-platelet.  The patient will be started on ASA 81 mg PO daily post-op  Thank you for allowing [place] to participate in this patient's care.   Adele Barthel, MD, FACS [facility] Office:  [phone] Pager: [phone]

## 2015-09-11 DIAGNOSIS — I739 Peripheral vascular disease, unspecified: Secondary | ICD-10-CM | POA: Diagnosis not present

## 2015-09-11 DIAGNOSIS — L97511 Non-pressure chronic ulcer of other part of right foot limited to breakdown of skin: Secondary | ICD-10-CM | POA: Diagnosis not present

## 2015-09-13 ENCOUNTER — Encounter: Payer: Self-pay | Admitting: Podiatry

## 2015-09-14 ENCOUNTER — Encounter (HOSPITAL_COMMUNITY): Payer: Self-pay

## 2015-09-14 ENCOUNTER — Encounter (HOSPITAL_COMMUNITY)
Admission: RE | Admit: 2015-09-14 | Discharge: 2015-09-14 | Disposition: A | Discharge status: Home / Self Care | Payer: PPO | Source: Ambulatory Visit | Attending: Vascular Surgery | Admitting: Vascular Surgery

## 2015-09-14 ENCOUNTER — Other Ambulatory Visit (HOSPITAL_COMMUNITY): Payer: Self-pay | Admitting: *Deleted

## 2015-09-14 DIAGNOSIS — L97509 Non-pressure chronic ulcer of other part of unspecified foot with unspecified severity: Secondary | ICD-10-CM | POA: Insufficient documentation

## 2015-09-14 DIAGNOSIS — Z0183 Encounter for blood typing: Secondary | ICD-10-CM | POA: Insufficient documentation

## 2015-09-14 DIAGNOSIS — I739 Peripheral vascular disease, unspecified: Secondary | ICD-10-CM | POA: Insufficient documentation

## 2015-09-14 DIAGNOSIS — Z01812 Encounter for preprocedural laboratory examination: Secondary | ICD-10-CM | POA: Diagnosis not present

## 2015-09-14 HISTORY — DX: Restless legs syndrome: G25.81

## 2015-09-14 HISTORY — DX: Reserved for inherently not codable concepts without codable children: IMO0001

## 2015-09-14 HISTORY — DX: Malignant (primary) neoplasm, unspecified: C80.1

## 2015-09-14 HISTORY — DX: Sleep apnea, unspecified: G47.30

## 2015-09-14 HISTORY — DX: Headache, unspecified: R51.9

## 2015-09-14 HISTORY — DX: Anxiety disorder, unspecified: F41.9

## 2015-09-14 HISTORY — DX: Headache: R51

## 2015-09-14 LAB — COMPREHENSIVE METABOLIC PANEL
ALBUMIN: 3.9 g/dL (ref 3.5–5.0)
ALK PHOS: 110 U/L (ref 38–126)
ALT: 21 U/L (ref 17–63)
ANION GAP: 6 (ref 5–15)
AST: 19 U/L (ref 15–41)
BUN: 18 mg/dL (ref 6–20)
CALCIUM: 10.1 mg/dL (ref 8.9–10.3)
CO2: 28 mmol/L (ref 22–32)
CREATININE: 1.2 mg/dL (ref 0.61–1.24)
Chloride: 107 mmol/L (ref 101–111)
GFR calc Af Amer: >60 mL/min (ref >60)
GFR calc non Af Amer: 57 mL/min — ABNORMAL LOW (ref >60)
GLUCOSE: 85 mg/dL (ref 65–99)
Potassium: 4 mmol/L (ref 3.5–5.1)
SODIUM: 141 mmol/L (ref 135–145)
Total Bilirubin: 0.3 mg/dL (ref 0.3–1.2)
Total Protein: 6.7 g/dL (ref 6.5–8.1)

## 2015-09-14 LAB — TYPE AND SCREEN
ABO/RH(D): A POS
Antibody Screen: NEGATIVE

## 2015-09-14 LAB — CBC
HCT: 49.5 % (ref 39.0–52.0)
HEMOGLOBIN: 15.9 g/dL (ref 13.0–17.0)
MCH: 28.6 pg (ref 26.0–34.0)
MCHC: 32.1 g/dL (ref 30.0–36.0)
MCV: 89.2 fL (ref 78.0–100.0)
Platelets: 306 10*3/uL (ref 150–400)
RBC: 5.55 MIL/uL (ref 4.22–5.81)
RDW: 14.9 % (ref 11.5–15.5)
WBC: 9.7 10*3/uL (ref 4.0–10.5)

## 2015-09-14 LAB — URINALYSIS, ROUTINE W REFLEX MICROSCOPIC
Bilirubin Urine: NEGATIVE
Glucose, UA: NEGATIVE mg/dL
Hgb urine dipstick: NEGATIVE
Ketones, ur: NEGATIVE mg/dL
LEUKOCYTES UA: NEGATIVE
NITRITE: NEGATIVE
PH: 5 (ref 5.0–8.0)
Protein, ur: NEGATIVE mg/dL
SPECIFIC GRAVITY, URINE: 1.028 (ref 1.005–1.030)

## 2015-09-14 LAB — PROTIME-INR
INR: 1.08 (ref 0.00–1.49)
Prothrombin Time: 14.2 seconds (ref 11.6–15.2)

## 2015-09-14 LAB — APTT: APTT: 30 s (ref 24–37)

## 2015-09-14 LAB — SURGICAL PCR SCREEN
MRSA, PCR: NEGATIVE
STAPHYLOCOCCUS AUREUS: NEGATIVE

## 2015-09-14 NOTE — Pre-Procedure Instructions (Signed)
Derrick Moon  09/14/2015     Your procedure is scheduled on Wednesday, September 19, 2015 at 12:15 PM.   Report to [facility] Entrance "A" Admitting Office at 10:15 AM.   Call this number if you have problems the morning of surgery: [phone]   Any questions prior to day of surgery, please call [phone] between 8 & 4 PM.   Remember:  Do not eat food or drink liquids after midnight Tuesday, 09/17/15.  Take these medicines the morning of surgery with A SIP OF WATER: Amlodipine (Norvasc), Aspirin, Tamsulosin (Flomax), Oxycodone - if neededFlovent inhaler, Spiriva inhaler, Albuterol inhaler - if needed (Bring this one with you morning of surgery)   Do not wear jewelry.  Do not wear lotions, powders, or colgone.  You may wear deoderant.  Men may shave face and neck.  Do not bring valuables to the hospital.  [facility] is not responsible for any belongings or valuables.  Contacts, dentures or bridgework may not be worn into surgery.  Leave your suitcase in the car.  After surgery it may be brought to your room.  For patients admitted to the hospital, discharge time will be determined by your treatment team.  Special instructions:  [facility] - Preparing for Surgery  Before surgery, you can play an important role.  Because skin is not sterile, your skin needs to be as free of germs as possible.  You can reduce the number of germs on you skin by washing with CHG (chlorahexidine gluconate) soap before surgery.  CHG is an antiseptic cleaner which kills germs and bonds with the skin to continue killing germs even after washing.  Please DO NOT use if you have an allergy to CHG or antibacterial soaps.  If your skin becomes reddened/irritated stop using the CHG and inform your nurse when you arrive at Short Stay.  Do not shave (including legs and underarms) for at least 48 hours prior to the first CHG shower.  You may shave your face.  Please follow these instructions  carefully:   1.  Shower with CHG Soap the night before surgery and the                                morning of Surgery.  2.  If you choose to wash your hair, wash your hair first as usual with your       normal shampoo.  3.  After you shampoo, rinse your hair and body thoroughly to remove the                      Shampoo.  4.  Use CHG as you would any other liquid soap.  You can apply chg directly       to the skin and wash gently with scrungie or a clean washcloth.  5.  Apply the CHG Soap to your body ONLY FROM THE NECK DOWN.        Do not use on open wounds or open sores.  Avoid contact with your eyes, ears, mouth and genitals (private parts).  Wash genitals (private parts) with your normal soap.  6.  Wash thoroughly, paying special attention to the area where your surgery        will be performed.  7.  Thoroughly rinse your body with warm water from the neck down.  8.  DO NOT shower/wash with your normal  soap after using and rinsing off       the CHG Soap.  9.  Pat yourself dry with a clean towel.            10.  Wear clean pajamas.            11.  Place clean sheets on your bed the night of your first shower and do not        sleep with pets.  Day of Surgery  Do not apply any lotions the morning of surgery.  Please wear clean clothes to the hospital.   Please read over the following fact sheets that you were given. Pain Booklet, Coughing and Deep Breathing, MRSA Information and Surgical Site Infection Prevention

## 2015-09-14 NOTE — Progress Notes (Signed)
Pt denies cardiac history, chest pain or sob. Pt saw Dr. Jacinta Shoe in May for cardiac clearance. Pt does have a small abdominal aortic aneurysm and states it's not grown any in 10 years.   EKG - 08/02/15 - in EPIC Echo - 2010 - in [place] - 08/15/15

## 2015-09-18 MED ORDER — DEXTROSE 5 % IV SOLN
1.5000 g | INTRAVENOUS | Status: AC
  Administered 2015-09-19: 1.5 g via INTRAVENOUS
  Filled 2015-09-18: qty 1.5

## 2015-09-19 ENCOUNTER — Inpatient Hospital Stay (HOSPITAL_COMMUNITY)
Admission: RE | Admit: 2015-09-19 | Discharge: 2015-09-21 | DRG: 254 | Disposition: A | Discharge status: Home / Self Care | Payer: PPO | Source: Ambulatory Visit | Attending: Vascular Surgery | Admitting: Vascular Surgery

## 2015-09-19 ENCOUNTER — Inpatient Hospital Stay (HOSPITAL_COMMUNITY): Payer: PPO | Admitting: Anesthesiology

## 2015-09-19 ENCOUNTER — Encounter (HOSPITAL_COMMUNITY): Payer: Self-pay | Admitting: Urology

## 2015-09-19 ENCOUNTER — Encounter (HOSPITAL_COMMUNITY)
Admission: RE | Disposition: A | Discharge status: Home / Self Care | Payer: Self-pay | Source: Ambulatory Visit | Attending: Vascular Surgery

## 2015-09-19 DIAGNOSIS — M79674 Pain in right toe(s): Secondary | ICD-10-CM | POA: Diagnosis not present

## 2015-09-19 DIAGNOSIS — H269 Unspecified cataract: Secondary | ICD-10-CM | POA: Diagnosis not present

## 2015-09-19 DIAGNOSIS — J449 Chronic obstructive pulmonary disease, unspecified: Secondary | ICD-10-CM | POA: Diagnosis not present

## 2015-09-19 DIAGNOSIS — F17290 Nicotine dependence, other tobacco product, uncomplicated: Secondary | ICD-10-CM | POA: Diagnosis present

## 2015-09-19 DIAGNOSIS — I70235 Atherosclerosis of native arteries of right leg with ulceration of other part of foot: Secondary | ICD-10-CM | POA: Diagnosis not present

## 2015-09-19 DIAGNOSIS — I714 Abdominal aortic aneurysm, without rupture: Secondary | ICD-10-CM | POA: Diagnosis present

## 2015-09-19 DIAGNOSIS — Z96611 Presence of right artificial shoulder joint: Secondary | ICD-10-CM | POA: Diagnosis not present

## 2015-09-19 DIAGNOSIS — N400 Enlarged prostate without lower urinary tract symptoms: Secondary | ICD-10-CM | POA: Diagnosis present

## 2015-09-19 DIAGNOSIS — M1990 Unspecified osteoarthritis, unspecified site: Secondary | ICD-10-CM | POA: Diagnosis not present

## 2015-09-19 DIAGNOSIS — I10 Essential (primary) hypertension: Secondary | ICD-10-CM | POA: Diagnosis not present

## 2015-09-19 DIAGNOSIS — L97519 Non-pressure chronic ulcer of other part of right foot with unspecified severity: Secondary | ICD-10-CM | POA: Diagnosis not present

## 2015-09-19 DIAGNOSIS — I998 Other disorder of circulatory system: Secondary | ICD-10-CM | POA: Diagnosis not present

## 2015-09-19 HISTORY — PX: FEMORAL-POPLITEAL BYPASS GRAFT: SHX937

## 2015-09-19 HISTORY — PX: VEIN HARVEST: SHX6363

## 2015-09-19 LAB — POCT I-STAT EG7
ACID-BASE EXCESS: 1 mmol/L (ref 0.0–2.0)
Bicarbonate: 26.8 mEq/L — ABNORMAL HIGH (ref 20.0–24.0)
Calcium, Ion: 1.3 mmol/L — ABNORMAL HIGH (ref 1.12–1.23)
HCT: 42 % (ref 39.0–52.0)
HEMOGLOBIN: 14.3 g/dL (ref 13.0–17.0)
O2 Saturation: 99 %
PCO2 VEN: 47.6 mmHg (ref 45.0–50.0)
PH VEN: 7.358 — AB (ref 7.250–7.300)
PO2 VEN: 124 mmHg — AB (ref 31.0–45.0)
Potassium: 4.2 mmol/L (ref 3.5–5.1)
Sodium: 143 mmol/L (ref 135–145)
TCO2: 28 mmol/L (ref 0–100)

## 2015-09-19 SURGERY — BYPASS GRAFT FEMORAL-POPLITEAL ARTERY
Anesthesia: General | Site: Leg Lower | Laterality: Right

## 2015-09-19 MED ORDER — PHENYLEPHRINE HCL 10 MG/ML IJ SOLN
INTRAMUSCULAR | Status: DC | PRN
  Administered 2015-09-19: 40 ug via INTRAVENOUS

## 2015-09-19 MED ORDER — LABETALOL HCL 5 MG/ML IV SOLN: 10.0000 mg | INTRAVENOUS | Status: DC | PRN

## 2015-09-19 MED ORDER — DEXTROSE 5 % IV SOLN
1.5000 g | Freq: Two times a day (BID) | INTRAVENOUS | Status: AC
  Administered 2015-09-20 (×2): 1.5 g via INTRAVENOUS
  Filled 2015-09-19 (×4): qty 1.5

## 2015-09-19 MED ORDER — SUCCINYLCHOLINE CHLORIDE 200 MG/10ML IV SOSY
PREFILLED_SYRINGE | INTRAVENOUS | Status: AC
  Filled 2015-09-19: qty 10

## 2015-09-19 MED ORDER — LIDOCAINE HCL (CARDIAC) 20 MG/ML IV SOLN
INTRAVENOUS | Status: DC | PRN
  Administered 2015-09-19: 60 mg via INTRAVENOUS

## 2015-09-19 MED ORDER — EPHEDRINE SULFATE 50 MG/ML IJ SOLN
INTRAMUSCULAR | Status: DC | PRN
  Administered 2015-09-19: 5 mg via INTRAVENOUS
  Administered 2015-09-19: 10 mg via INTRAVENOUS

## 2015-09-19 MED ORDER — ONDANSETRON HCL 4 MG/2ML IJ SOLN
INTRAMUSCULAR | Status: AC
  Filled 2015-09-19: qty 2

## 2015-09-19 MED ORDER — ONDANSETRON HCL 4 MG/2ML IJ SOLN
INTRAMUSCULAR | Status: DC | PRN
  Administered 2015-09-19: 4 mg via INTRAVENOUS

## 2015-09-19 MED ORDER — SODIUM CHLORIDE 0.9 % IV SOLN: 500.0000 mL | Freq: Once | INTRAVENOUS | Status: DC | PRN

## 2015-09-19 MED ORDER — LIDOCAINE 2% (20 MG/ML) 5 ML SYRINGE
INTRAMUSCULAR | Status: AC
  Filled 2015-09-19: qty 5

## 2015-09-19 MED ORDER — ACETAMINOPHEN 325 MG RE SUPP: 325.0000 mg | RECTAL | Status: DC | PRN

## 2015-09-19 MED ORDER — PROTAMINE SULFATE 10 MG/ML IV SOLN
INTRAVENOUS | Status: DC | PRN
  Administered 2015-09-19 (×5): 10 mg via INTRAVENOUS

## 2015-09-19 MED ORDER — FENTANYL CITRATE (PF) 100 MCG/2ML IJ SOLN
INTRAMUSCULAR | Status: DC | PRN
  Administered 2015-09-19 (×3): 50 ug via INTRAVENOUS
  Administered 2015-09-19: 100 ug via INTRAVENOUS

## 2015-09-19 MED ORDER — ROCURONIUM BROMIDE 50 MG/5ML IV SOLN
INTRAVENOUS | Status: AC
  Filled 2015-09-19: qty 1

## 2015-09-19 MED ORDER — 0.9 % SODIUM CHLORIDE (POUR BTL) OPTIME
TOPICAL | Status: DC | PRN
  Administered 2015-09-19: 2000 mL

## 2015-09-19 MED ORDER — PROPOFOL 10 MG/ML IV BOLUS
INTRAVENOUS | Status: DC | PRN
  Administered 2015-09-19: 200 mg via INTRAVENOUS

## 2015-09-19 MED ORDER — DEXAMETHASONE SODIUM PHOSPHATE 10 MG/ML IJ SOLN
INTRAMUSCULAR | Status: AC
  Filled 2015-09-19: qty 1

## 2015-09-19 MED ORDER — LACTATED RINGERS IV SOLN
INTRAVENOUS | Status: DC
  Administered 2015-09-19: 11:00:00 via INTRAVENOUS

## 2015-09-19 MED ORDER — IOPAMIDOL (ISOVUE-300) INJECTION 61%
INTRAVENOUS | Status: AC
  Filled 2015-09-19: qty 50

## 2015-09-19 MED ORDER — HEPARIN SODIUM (PORCINE) 1000 UNIT/ML IJ SOLN
INTRAMUSCULAR | Status: DC | PRN
  Administered 2015-09-19: 9000 [IU] via INTRAVENOUS

## 2015-09-19 MED ORDER — MAGNESIUM SULFATE 2 GM/50ML IV SOLN
2.0000 g | Freq: Every day | INTRAVENOUS | Status: DC | PRN
  Filled 2015-09-19: qty 50

## 2015-09-19 MED ORDER — PROPOFOL 10 MG/ML IV BOLUS
INTRAVENOUS | Status: AC
  Filled 2015-09-19: qty 20

## 2015-09-19 MED ORDER — SUCCINYLCHOLINE CHLORIDE 20 MG/ML IJ SOLN
INTRAMUSCULAR | Status: DC | PRN
  Administered 2015-09-19: 100 mg via INTRAVENOUS

## 2015-09-19 MED ORDER — POTASSIUM CHLORIDE CRYS ER 20 MEQ PO TBCR: 20.0000 meq | EXTENDED_RELEASE_TABLET | Freq: Every day | ORAL | Status: DC | PRN

## 2015-09-19 MED ORDER — SUGAMMADEX SODIUM 200 MG/2ML IV SOLN
INTRAVENOUS | Status: DC | PRN
  Administered 2015-09-19: 179.2 mg via INTRAVENOUS

## 2015-09-19 MED ORDER — SODIUM CHLORIDE 0.9 % IV SOLN
INTRAVENOUS | Status: DC
  Administered 2015-09-19: 22:00:00 via INTRAVENOUS

## 2015-09-19 MED ORDER — PHENOL 1.4 % MT LIQD: 1.0000 | OROMUCOSAL | Status: DC | PRN

## 2015-09-19 MED ORDER — HEPARIN SODIUM (PORCINE) 1000 UNIT/ML IJ SOLN
INTRAMUSCULAR | Status: AC
  Filled 2015-09-19: qty 1

## 2015-09-19 MED ORDER — MORPHINE SULFATE (PF) 2 MG/ML IV SOLN: 2.0000 mg | INTRAVENOUS | Status: DC | PRN

## 2015-09-19 MED ORDER — ALBUTEROL SULFATE (2.5 MG/3ML) 0.083% IN NEBU
2.5000 mg | INHALATION_SOLUTION | Freq: Four times a day (QID) | RESPIRATORY_TRACT | Status: DC | PRN
  Administered 2015-09-21: 2.5 mg via RESPIRATORY_TRACT
  Filled 2015-09-19: qty 3

## 2015-09-19 MED ORDER — HYDROMORPHONE HCL 1 MG/ML IJ SOLN
0.2500 mg | INTRAMUSCULAR | Status: DC | PRN
  Administered 2015-09-19: 0.5 mg via INTRAVENOUS

## 2015-09-19 MED ORDER — HYDROMORPHONE HCL 1 MG/ML IJ SOLN
INTRAMUSCULAR | Status: AC
  Filled 2015-09-19: qty 1

## 2015-09-19 MED ORDER — BUDESONIDE 0.25 MG/2ML IN SUSP
0.2500 mg | Freq: Two times a day (BID) | RESPIRATORY_TRACT | Status: DC
  Administered 2015-09-19 – 2015-09-21 (×4): 0.25 mg via RESPIRATORY_TRACT
  Filled 2015-09-19 (×4): qty 2

## 2015-09-19 MED ORDER — ONDANSETRON HCL 4 MG/2ML IJ SOLN
4.0000 mg | Freq: Four times a day (QID) | INTRAMUSCULAR | Status: DC | PRN
  Administered 2015-09-19: 4 mg via INTRAVENOUS
  Filled 2015-09-19: qty 2

## 2015-09-19 MED ORDER — CHLORHEXIDINE GLUCONATE CLOTH 2 % EX PADS: 6.0000 | MEDICATED_PAD | Freq: Once | CUTANEOUS | Status: DC

## 2015-09-19 MED ORDER — ACETAMINOPHEN 325 MG PO TABS: 325.0000 mg | ORAL_TABLET | ORAL | Status: DC | PRN

## 2015-09-19 MED ORDER — OXYCODONE-ACETAMINOPHEN 5-325 MG PO TABS
1.0000 | ORAL_TABLET | ORAL | Status: DC | PRN
  Administered 2015-09-19: 1 via ORAL
  Administered 2015-09-20 (×2): 2 via ORAL
  Administered 2015-09-20: 1 via ORAL
  Administered 2015-09-21: 2 via ORAL
  Filled 2015-09-19: qty 2
  Filled 2015-09-19: qty 1
  Filled 2015-09-19: qty 2
  Filled 2015-09-19: qty 1
  Filled 2015-09-19: qty 2

## 2015-09-19 MED ORDER — METOPROLOL TARTRATE 5 MG/5ML IV SOLN: 2.0000 mg | INTRAVENOUS | Status: DC | PRN

## 2015-09-19 MED ORDER — DOCUSATE SODIUM 100 MG PO CAPS
100.0000 mg | ORAL_CAPSULE | Freq: Every day | ORAL | Status: DC
  Administered 2015-09-20 – 2015-09-21 (×2): 100 mg via ORAL
  Filled 2015-09-19 (×2): qty 1

## 2015-09-19 MED ORDER — LABETALOL HCL 5 MG/ML IV SOLN
INTRAVENOUS | Status: AC
  Filled 2015-09-19: qty 4

## 2015-09-19 MED ORDER — PHENYLEPHRINE 40 MCG/ML (10ML) SYRINGE FOR IV PUSH (FOR BLOOD PRESSURE SUPPORT)
PREFILLED_SYRINGE | INTRAVENOUS | Status: AC
  Filled 2015-09-19: qty 10

## 2015-09-19 MED ORDER — ALUM & MAG HYDROXIDE-SIMETH 200-200-20 MG/5ML PO SUSP
15.0000 mL | ORAL | Status: DC | PRN
Start: 2015-09-19 — End: 2015-09-21
  Administered 2015-09-20: 30 mL via ORAL
  Filled 2015-09-19: qty 30

## 2015-09-19 MED ORDER — GLYCOPYRROLATE 0.2 MG/ML IV SOSY
PREFILLED_SYRINGE | INTRAVENOUS | Status: AC
Start: 2015-09-19 — End: 2015-09-19
  Filled 2015-09-19: qty 3

## 2015-09-19 MED ORDER — TAMSULOSIN HCL 0.4 MG PO CAPS
0.4000 mg | ORAL_CAPSULE | Freq: Two times a day (BID) | ORAL | Status: DC
  Administered 2015-09-19 – 2015-09-21 (×4): 0.4 mg via ORAL
  Filled 2015-09-19 (×4): qty 1

## 2015-09-19 MED ORDER — GLYCOPYRROLATE 0.2 MG/ML IJ SOLN
INTRAMUSCULAR | Status: DC | PRN
  Administered 2015-09-19: 0.1 mg via INTRAVENOUS

## 2015-09-19 MED ORDER — FENTANYL CITRATE (PF) 250 MCG/5ML IJ SOLN
INTRAMUSCULAR | Status: AC
  Filled 2015-09-19: qty 5

## 2015-09-19 MED ORDER — AMLODIPINE BESYLATE 5 MG PO TABS
5.0000 mg | ORAL_TABLET | Freq: Every day | ORAL | Status: DC
  Administered 2015-09-19 – 2015-09-20 (×2): 5 mg via ORAL
  Filled 2015-09-19 (×2): qty 1

## 2015-09-19 MED ORDER — PANTOPRAZOLE SODIUM 40 MG PO TBEC
40.0000 mg | DELAYED_RELEASE_TABLET | Freq: Every day | ORAL | Status: DC
  Administered 2015-09-19 – 2015-09-21 (×3): 40 mg via ORAL
  Filled 2015-09-19 (×3): qty 1

## 2015-09-19 MED ORDER — HYDRALAZINE HCL 20 MG/ML IJ SOLN: 5.0000 mg | INTRAMUSCULAR | Status: DC | PRN

## 2015-09-19 MED ORDER — LISINOPRIL 40 MG PO TABS
40.0000 mg | ORAL_TABLET | Freq: Every day | ORAL | Status: DC
  Administered 2015-09-19 – 2015-09-21 (×3): 40 mg via ORAL
  Filled 2015-09-19 (×3): qty 1

## 2015-09-19 MED ORDER — ENOXAPARIN SODIUM 40 MG/0.4ML ~~LOC~~ SOLN
40.0000 mg | SUBCUTANEOUS | Status: DC
  Administered 2015-09-20: 40 mg via SUBCUTANEOUS
  Filled 2015-09-19: qty 0.4

## 2015-09-19 MED ORDER — PHENYLEPHRINE HCL 10 MG/ML IJ SOLN
10.0000 mg | INTRAVENOUS | Status: DC | PRN
  Administered 2015-09-19: 30 ug/min via INTRAVENOUS

## 2015-09-19 MED ORDER — PROMETHAZINE HCL 25 MG/ML IJ SOLN: 6.2500 mg | INTRAMUSCULAR | Status: DC | PRN

## 2015-09-19 MED ORDER — HEMOSTATIC AGENTS (NO CHARGE) OPTIME
TOPICAL | Status: DC | PRN
  Administered 2015-09-19: 1 via TOPICAL

## 2015-09-19 MED ORDER — PROTAMINE SULFATE 10 MG/ML IV SOLN
INTRAVENOUS | Status: AC
  Filled 2015-09-19: qty 5

## 2015-09-19 MED ORDER — LACTATED RINGERS IV SOLN
INTRAVENOUS | Status: DC | PRN
  Administered 2015-09-19 (×3): via INTRAVENOUS

## 2015-09-19 MED ORDER — MIDAZOLAM HCL 2 MG/2ML IJ SOLN
INTRAMUSCULAR | Status: AC
  Filled 2015-09-19: qty 2

## 2015-09-19 MED ORDER — ESMOLOL HCL 100 MG/10ML IV SOLN
INTRAVENOUS | Status: AC
  Filled 2015-09-19: qty 10

## 2015-09-19 MED ORDER — SUGAMMADEX SODIUM 200 MG/2ML IV SOLN
INTRAVENOUS | Status: AC
  Filled 2015-09-19: qty 2

## 2015-09-19 MED ORDER — ROCURONIUM BROMIDE 100 MG/10ML IV SOLN
INTRAVENOUS | Status: DC | PRN
  Administered 2015-09-19: 20 mg via INTRAVENOUS
  Administered 2015-09-19 (×2): 10 mg via INTRAVENOUS
  Administered 2015-09-19: 20 mg via INTRAVENOUS
  Administered 2015-09-19: 10 mg via INTRAVENOUS

## 2015-09-19 MED ORDER — SODIUM CHLORIDE 0.9 % IV SOLN: INTRAVENOUS | Status: DC

## 2015-09-19 MED ORDER — TIOTROPIUM BROMIDE MONOHYDRATE 18 MCG IN CAPS
18.0000 ug | ORAL_CAPSULE | Freq: Every day | RESPIRATORY_TRACT | Status: DC
  Administered 2015-09-20: 18 ug via RESPIRATORY_TRACT
  Filled 2015-09-19: qty 5

## 2015-09-19 MED ORDER — EPHEDRINE 5 MG/ML INJ
INTRAVENOUS | Status: AC
  Filled 2015-09-19: qty 10

## 2015-09-19 MED ORDER — SODIUM CHLORIDE 0.9 % IV SOLN
INTRAVENOUS | Status: DC | PRN
  Administered 2015-09-19: 500 mL

## 2015-09-19 MED ORDER — DEXAMETHASONE SODIUM PHOSPHATE 10 MG/ML IJ SOLN
INTRAMUSCULAR | Status: DC | PRN
  Administered 2015-09-19: 8 mg via INTRAVENOUS

## 2015-09-19 SURGICAL SUPPLY — 71 items
AGENT HMST SPONGE THK3/8 (HEMOSTASIS) ×1
BANDAGE ELASTIC 4 VELCRO ST LF (GAUZE/BANDAGES/DRESSINGS) IMPLANT
BANDAGE ESMARK 6X9 LF (GAUZE/BANDAGES/DRESSINGS) IMPLANT
BLADE SURG 11 STRL SS (BLADE) ×2 IMPLANT
BNDG CMPR 9X6 STRL LF SNTH (GAUZE/BANDAGES/DRESSINGS)
BNDG ESMARK 6X9 LF (GAUZE/BANDAGES/DRESSINGS)
CANISTER SUCTION 2500CC (MISCELLANEOUS) ×3 IMPLANT
CLIP TI MEDIUM 24 (CLIP) ×3 IMPLANT
CLIP TI WIDE RED SMALL 24 (CLIP) ×3 IMPLANT
COVER PROBE W GEL 5X96 (DRAPES) ×3 IMPLANT
CUFF TOURNIQUET SINGLE 24IN (TOURNIQUET CUFF) IMPLANT
CUFF TOURNIQUET SINGLE 34IN LL (TOURNIQUET CUFF) IMPLANT
CUFF TOURNIQUET SINGLE 44IN (TOURNIQUET CUFF) IMPLANT
DRAIN CHANNEL 15F RND FF W/TCR (WOUND CARE) IMPLANT
DRAPE C-ARM 42X72 X-RAY (DRAPES) ×3 IMPLANT
DRAPE PROXIMA HALF (DRAPES) IMPLANT
DRSG COVADERM 4X10 (GAUZE/BANDAGES/DRESSINGS) ×2 IMPLANT
DRSG COVADERM 4X6 (GAUZE/BANDAGES/DRESSINGS) ×4 IMPLANT
DRSG COVADERM 4X8 (GAUZE/BANDAGES/DRESSINGS) ×4 IMPLANT
ELECT CAUTERY BLADE 6.4 (BLADE) ×2 IMPLANT
ELECT REM PT RETURN 9FT ADLT (ELECTROSURGICAL) ×3
ELECTRODE REM PT RTRN 9FT ADLT (ELECTROSURGICAL) ×1 IMPLANT
EVACUATOR SILICONE 100CC (DRAIN) IMPLANT
GLOVE BIO SURGEON STRL SZ 6.5 (GLOVE) ×2 IMPLANT
GLOVE BIO SURGEON STRL SZ7 (GLOVE) ×3 IMPLANT
GLOVE BIO SURGEONS STRL SZ 6.5 (GLOVE) ×2
GLOVE BIOGEL PI IND STRL 6.5 (GLOVE) IMPLANT
GLOVE BIOGEL PI IND STRL 7.5 (GLOVE) ×1 IMPLANT
GLOVE BIOGEL PI INDICATOR 6.5 (GLOVE) ×6
GLOVE BIOGEL PI INDICATOR 7.5 (GLOVE) ×2
GLOVE SURG SS PI 7.0 STRL IVOR (GLOVE) ×4 IMPLANT
GOWN SPEC L4 XLG W/TWL (GOWN DISPOSABLE) ×4 IMPLANT
GOWN STRL REUS W/ TWL LRG LVL3 (GOWN DISPOSABLE) ×3 IMPLANT
GOWN STRL REUS W/TWL LRG LVL3 (GOWN DISPOSABLE) ×12
HEMOSTAT SPONGE AVITENE ULTRA (HEMOSTASIS) ×2 IMPLANT
INSERT FOGARTY SM (MISCELLANEOUS) IMPLANT
KIT BASIN OR (CUSTOM PROCEDURE TRAY) ×3 IMPLANT
KIT ROOM TURNOVER OR (KITS) ×3 IMPLANT
LIQUID BAND (GAUZE/BANDAGES/DRESSINGS) ×4 IMPLANT
LOOP VESSEL MINI RED (MISCELLANEOUS) ×2 IMPLANT
MARKER GRAFT CORONARY BYPASS (MISCELLANEOUS) IMPLANT
NS IRRIG 1000ML POUR BTL (IV SOLUTION) ×6 IMPLANT
PACK PERIPHERAL VASCULAR (CUSTOM PROCEDURE TRAY) ×3 IMPLANT
PACK UNIVERSAL I (CUSTOM PROCEDURE TRAY) ×2 IMPLANT
PAD ARMBOARD 7.5X6 YLW CONV (MISCELLANEOUS) ×6 IMPLANT
PADDING CAST COTTON 6X4 STRL (CAST SUPPLIES) IMPLANT
PENCIL BUTTON HOLSTER BLD 10FT (ELECTRODE) ×2 IMPLANT
SET MICROPUNCTURE 5F STIFF (MISCELLANEOUS) IMPLANT
STAPLER VISISTAT 35W (STAPLE) ×2 IMPLANT
STOPCOCK 4 WAY LG BORE MALE ST (IV SETS) IMPLANT
SUT ETHILON 3 0 PS 1 (SUTURE) IMPLANT
SUT GORETEX 5 0 TT13 24 (SUTURE) IMPLANT
SUT GORETEX 6.0 TT13 (SUTURE) IMPLANT
SUT MNCRL AB 4-0 PS2 18 (SUTURE) ×13 IMPLANT
SUT PROLENE 5 0 C 1 24 (SUTURE) ×7 IMPLANT
SUT PROLENE 6 0 BV (SUTURE) ×5 IMPLANT
SUT PROLENE 7 0 BV 1 (SUTURE) ×4 IMPLANT
SUT SILK 2 0 (SUTURE) ×3
SUT SILK 2 0 FS (SUTURE) IMPLANT
SUT SILK 2 0 SH (SUTURE) ×2 IMPLANT
SUT SILK 2-0 18XBRD TIE 12 (SUTURE) IMPLANT
SUT SILK 3 0 (SUTURE) ×3
SUT SILK 3-0 18XBRD TIE 12 (SUTURE) IMPLANT
SUT VIC AB 2-0 CT1 27 (SUTURE) ×6
SUT VIC AB 2-0 CT1 TAPERPNT 27 (SUTURE) ×2 IMPLANT
SUT VIC AB 3-0 SH 27 (SUTURE) ×12
SUT VIC AB 3-0 SH 27X BRD (SUTURE) ×3 IMPLANT
TRAY FOLEY W/METER SILVER 16FR (SET/KITS/TRAYS/PACK) ×3 IMPLANT
TUBING EXTENTION W/L.L. (IV SETS) IMPLANT
UNDERPAD 30X30 INCONTINENT (UNDERPADS AND DIAPERS) ×3 IMPLANT
WATER STERILE IRR 1000ML POUR (IV SOLUTION) ×3 IMPLANT

## 2015-09-19 NOTE — Interval H&P Note (Signed)
History and Physical Interval Note:  09/19/2015 12:07 PM  Derrick Moon  has presented today for surgery, with the diagnosis of Peripheral vascular disease with fourth and fifth toe ulcer I70.235  The various methods of treatment have been discussed with the patient and family. After consideration of risks, benefits and other options for treatment, the patient has consented to  Procedure(s): BYPASS GRAFT COMMON FEMORAL-ABOVE KNEE POPLITEAL ARTERY (Right) GREATER SAPHENOUS VEIN HARVEST (Right) as a surgical intervention .  The patient's history has been reviewed, patient examined, no change in status, stable for surgery.  I have reviewed the patient's chart and labs.  Questions were answered to the patient's satisfaction.     Adele Barthel

## 2015-09-19 NOTE — H&P (View-Only) (Signed)
Established Critical Limb Ischemia Patient  History of Present Illness  Derrick Moon is a 75 y.o. (1940/06/26) male who presents with chief complaint: right foot pain and cramping.  The patient does not have rest pain but has intermittent pain in his R 4th/5th toe ulcer.  His ulceration has nearly healed.  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  The patient notes symptoms have not progressed.  The patient's treatment regimen currently included: maximal medical management and wound care.    He has been cleared by Cardiology to proceed with bypass.  Past Medical History  Diagnosis Date  . AAA (abdominal aortic aneurysm) ([place])   . Arthritis     Osteo  . Hypertension     takes Amlodipine and Lisinpril daily  . Enlarged prostate     takes Flomax daily  . COPD (chronic obstructive pulmonary disease) ([facility])     Albuterol inhaler as needed  . Pneumonia     several times with most recent within 5 yrs ago  . History of bronchitis     > 69yrs ago  . History of dizziness     several yrs ago-was on Meclizine but has been off for several yrs-no problems since  . AAA (abdominal aortic aneurysm) ([place])   . Joint pain   . Joint swelling   . GERD (gastroesophageal reflux disease)     was on Prilosec but has been off for yrs-no issues now with reflux  . History of colon polyps     benign  . Urinary urgency   . Diverticulitis   . Nocturia   . Cataracts, bilateral     Past Surgical History  Procedure Laterality Date  . Foot surgery Right     x 5t  . Cholecystectomy  July 16,2010  . Rotator cuff repair      Bilateral   . Biceps tendon repair      LEFT  . Carpal tunnel release Right 2014  . Hand surgery Right   . Tumor removed from bladder    . Hernia repair Bilateral     inguinal  . Colonoscopy    . Esophagogastroduodenoscopy    . Cystoscopy    . Reverse total shoulder arthroplasty Right 07/07/2014    Dr Veverly Fells  . Reverse shoulder arthroplasty  Right 07/07/2014    Procedure: RIGHT REVERSE TOTAL SHOULDER ;  Surgeon: Netta Cedars, MD;  Location: [facility];  Service: Orthopedics;  Laterality: Right;  . Peripheral vascular catheterization N/A 08/02/2015    Procedure: Abdominal Aortogram w/Lower Extremity;  Surgeon: Conrad Haddam, MD;  Location: [facility];  Service: Cardiovascular;  Laterality: N/A;    Social History   Social History  . Marital Status: Married    Spouse Name: N/A  . Number of Children: N/A  . Years of Education: N/A   Occupational History  . Not on file.   Social History Main Topics  . Smoking status: Current Some Day Smoker    Types: Cigars  . Smokeless tobacco: Never Used     Comment: quit smoking 6-8wks ago;1 a week if that much  . Alcohol Use: 0.0 oz/week    0 Standard drinks or equivalent per week     Comment: wine occasionally  . Drug Use: No  . Sexual Activity: Yes   Other Topics Concern  . Not on file   Social History Narrative    Family History  Problem Relation Age of Onset  .  Parkinson's disease Mother   . AAA (abdominal aortic aneurysm) Father   . Heart defect Sister     Congenital Heart Disease  . Heart disease Sister     Current Outpatient Prescriptions  Medication Sig Dispense Refill  . albuterol (PROVENTIL HFA;VENTOLIN HFA) 108 (90 BASE) MCG/ACT inhaler Inhale 1-2 puffs into the lungs every 6 (six) hours as needed for wheezing or shortness of breath.    [name] amLODipine (NORVASC) 5 MG tablet Take 5 mg by mouth daily.    . fluticasone (FLOVENT HFA) 110 MCG/ACT inhaler Inhale 1 puff into the lungs 2 (two) times daily.    [name] lisinopril (PRINIVIL,ZESTRIL) 40 MG tablet Take 40 mg by mouth daily.    [name] oxyCODONE (OXY IR/ROXICODONE) 5 MG immediate release tablet Take 5 mg by mouth at bedtime as needed for severe pain.    . Tamsulosin HCl (FLOMAX) 0.4 MG CAPS Take 0.4 mg by mouth 2 (two) times daily.     [name] tiotropium (SPIRIVA) 18 MCG inhalation capsule Place 18 mcg into inhaler and inhale  daily.    . vitamin C (ASCORBIC ACID) 500 MG tablet Take 500 mg by mouth daily.     No current facility-administered medications for this visit.     Allergies  Allergen Reactions  . Aspartame And Phenylalanine Nausea Only  . Sulfonamide Derivatives Nausea And Vomiting     REVIEW OF SYSTEMS:  (Positives checked otherwise negative)  CARDIOVASCULAR:   [ ]  chest pain,  [ ]  chest pressure,  [ ]  palpitations,  [ ]  shortness of breath when laying flat,  [x]  shortness of breath with exertion,   [x]  pain in feet when walking,  [x]  pain in feet when laying flat, [ ]  history of blood clot in veins (DVT),  [ ]  history of phlebitis,  [ ]  swelling in legs,  [ ]  varicose veins  PULMONARY:   [ ]  productive cough,  [ ]  asthma,  [ ]  wheezing [x]  COPD  NEUROLOGIC:   [ ]  weakness in arms or legs,  [ ]  numbness in arms or legs,  [ ]  difficulty speaking or slurred speech,  [ ]  temporary loss of vision in one eye,  [ ]  dizziness  HEMATOLOGIC:   [ ]  bleeding problems,  [ ]  problems with blood clotting too easily  MUSCULOSKEL:   [ ]  joint pain, [ ]  joint swelling  GASTROINTEST:   [ ]  vomiting blood,  [ ]  blood in stool     GENITOURINARY:   [ ]  burning with urination,  [ ]  blood in urine  PSYCHIATRIC:   [ ]  history of major depression  INTEGUMENTARY:   [ ]  rashes,  [x]  ulcers  CONSTITUTIONAL:   [ ]  fever,  [ ]  chills     Physical Examination  Filed Vitals:   09/07/15 1319  BP: 116/69  Pulse: 71  Height: 5' 11.5" (1.816 m)  Weight: 197 lb (89.359 kg)  SpO2: 93%   Body mass index is 27.1 kg/(m^2).  General: A&O x 3, WDWN  Eyes: PERRLA, EOMI  Pulmonary: Sym exp, good air movt, CTAB, no rales, rhonchi, & wheezing  Cardiac: RRR, Nl S1, S2, no Murmurs, rubs or gallops  Vascular: Vessel Right Left  Radial Palpable Palpable  Brachial Palpable Palpable  Carotid Palpable, without bruit Palpable, without bruit  Aorta Not palpable N/A  Femoral Palpable  Palpable  Popliteal Not palpable Not palpable  PT Not Palpable Not Palpable  DP Not Palpable Palpable   Gastrointestinal: soft, NTND,  no G/R, no HSM, no masses, no CVAT B  Musculoskeletal: M/S 5/5 throughout , Extremities without ischemic changes   Neurologic: Pain and light touch intact in extremities , Motor exam as listed above  Non-Invasive Vascular Imaging BLE GSV mapping (08/30/15): adequate GSV throughout R leg, likely ok vein in L thigh   Medical Decision Making  Derrick Moon is a 75 y.o. male who presents with: RLE critical limb ischemia with healing R 4th/5th toe ulcer   Based on the patient's vascular studies and examination, I have offered the patient: R CFA to AK pop BPG with ips GSV.  He is scheduled for 28 JUN 17. The risk, benefits, and alternative for bypass operations were discussed with the patient.   The patient is aware the risks include but are not limited to: bleeding, infection, myocardial infarction, stroke, limb loss, nerve damage, need for additional procedures in the future, wound complications, and inability to complete the bypass.  The patient is aware of these risks and agreed to proceed. I discussed in depth with the patient the nature of atherosclerosis, and emphasized the importance of maximal medical management including strict control of blood pressure, blood glucose, and lipid levels, antiplatelet agents, obtaining regular exercise, and cessation of smoking.    The patient is aware that without maximal medical management the underlying atherosclerotic disease process will progress, limiting the benefit of any interventions. The patient is currently not on a statin: not medically indicated. The patient is currently on an anti-platelet.  The patient will be started on ASA 81 mg PO daily post-op  Thank you for allowing [place] to participate in this patient's care.   Adele Barthel, MD, FACS [facility] Office:  [phone] Pager: [phone]

## 2015-09-19 NOTE — Progress Notes (Signed)
Pt reports having rash after arteriogram. Pt reports no hx allergy to betadine, shellfish

## 2015-09-19 NOTE — Anesthesia Procedure Notes (Signed)
Procedure Name: Intubation Date/Time: 09/19/2015 1:31 PM Performed by: Eligha Bridegroom Pre-anesthesia Checklist: Patient identified, Emergency Drugs available, Suction available and Patient being monitored Patient Re-evaluated:Patient Re-evaluated prior to inductionOxygen Delivery Method: Circle System Utilized Preoxygenation: Pre-oxygenation with 100% oxygen Intubation Type: IV induction Ventilation: Two handed mask ventilation required and Oral airway inserted - appropriate to patient size Laryngoscope Size: Mac and 4 Grade View: Grade II Tube type: Oral Number of attempts: 1 Airway Equipment and Method: Stylet and Oral airway Placement Confirmation: ETT inserted through vocal cords under direct vision,  positive ETCO2 and breath sounds checked- equal and bilateral Secured at: 23 cm Tube secured with: Tape Dental Injury: Teeth and Oropharynx as per pre-operative assessment

## 2015-09-19 NOTE — Anesthesia Preprocedure Evaluation (Addendum)
Anesthesia Evaluation  Patient identified by MRN, date of birth, ID band Patient awake    Reviewed: Allergy & Precautions, NPO status , Patient's Chart, lab work & pertinent test results  Airway Mallampati: II  TM Distance: >3 FB Neck ROM: Full    Dental  (+) Dental Advisory Given   Pulmonary sleep apnea , COPD,  COPD inhaler, Current Smoker,    breath sounds clear to auscultation       Cardiovascular hypertension, Pt. on medications + Peripheral Vascular Disease   Rhythm:Regular Rate:Normal  Myocardial stress test No diagnostic ST segment changes to indicate ischemia. Small, mild intensity, partially reversible mid apical inferoseptal defect. Possibly associated with variable soft tissue attenuation, however mild region of ischemia is not excluded.   This is a low risk study. Nuclear stress EF: 66%.   Neuro/Psych Anxiety negative neurological ROS     GI/Hepatic Neg liver ROS, GERD  ,  Endo/Other  negative endocrine ROS  Renal/GU Renal disease     Musculoskeletal  (+) Arthritis ,   Abdominal   Peds  Hematology negative hematology ROS (+)   Anesthesia Other Findings   Reproductive/Obstetrics                            Lab Results  Component Value Date   WBC 9.7 09/14/2015   HGB 15.9 09/14/2015   HCT 49.5 09/14/2015   MCV 89.2 09/14/2015   PLT 306 09/14/2015   Lab Results  Component Value Date   CREATININE 1.20 09/14/2015   BUN 18 09/14/2015   NA 141 09/14/2015   K 4.0 09/14/2015   CL 107 09/14/2015   CO2 28 09/14/2015    Anesthesia Physical Anesthesia Plan  ASA: III  Anesthesia Plan: General   Post-op Pain Management:    Induction: Intravenous  Airway Management Planned: Oral ETT  Additional Equipment:   Intra-op Plan:   Post-operative Plan: Extubation in OR  Informed Consent: I have reviewed the patients History and Physical, chart, labs and discussed the  procedure including the risks, benefits and alternatives for the proposed anesthesia with the patient or authorized representative who has indicated his/her understanding and acceptance.   Dental advisory given  Plan Discussed with: CRNA  Anesthesia Plan Comments:         Anesthesia Quick Evaluation

## 2015-09-19 NOTE — Op Note (Addendum)
OPERATIVE NOTE   PROCEDURE: 1. Right common femoral artery to above-the-knee popliteal artery bypass with ipsilateral non-reversed greater saphenous vein   PRE-OPERATIVE DIAGNOSIS: critical limb ischemia   POST-OPERATIVE DIAGNOSIS: same as above   SURGEON: Adele Barthel, MD  ASSISTANT(S): Silva Bandy, PAC   ANESTHESIA: general  ESTIMATED BLOOD LOSS: 100 cc  FINDING(S): 1.  Soft common femoral artery with calcified distal external iliac artery and profunda femoral artery  2.  Diseased but soft above-the-knee popliteal artery (<50% stenosis) 3.  Sclerotic appearing greater saphenous vein with some evidence of prior episode of phlebitis 4.  Multiphasic posterior tibial artery and anterior tibial artery signals  SPECIMEN(S):  none  INDICATIONS:   Derrick Moon is a 75 y.o. male who presents with poorly healing right wounds.  Angiography demonstrated a superficial femoral artery occlusion with rapid collateral flow.  The patient initial appeared to be healing but then had further wound breakdown.  I recommended proceeding with right common femoral artery to above-the-knee popliteal bypass.  The risk, benefits, and alternative for bypass operations were discussed with the patient.  The patient is aware the risks include but are not limited to: bleeding, infection, myocardial infarction, stroke, limb loss, nerve damage, need for additional procedures in the future, wound complications, and inability to complete the bypass.  The patient is aware of these risks and agreed to proceed.  DESCRIPTION: After full informed written consent was obtained, the patient was brought back to the operating room and placed supine upon the operating table.  Prior to induction, the patient was given intravenous antibiotics.  After obtaining adequate anesthesia, the patient was prepped and draped in the standard fashion for a femoral to popliteal bypass operation.  Attention was turned to the right groin.  A  longitudinal incision was made over the right common femoral artery.  Using blunt dissection and electrocautery, the artery was dissected out from the inguinal ligament down to the femoral bifurcation.  The superficial femoral artery, profunda femoral artery, and external iliac artery were dissected out and vessel loops applied.  Circumflex branches were also dissected and controlled with silk ties.  This common femoral artery was found on exam to be soft but the profunda femoral artery orifice was calcified.    At this point, attention was turned to the distal thigh.  An longitudinal incision was made over Hunter's canal.  Using blunt dissection and electrocautery, a plane was developed through the subcutaneous tissue and fascia down to Hunter's canal.  The above-the-knee popliteal artery was dissected away from its adjacent veins.  This popliteal artery was found on exam to be soft but diseased with a pale yellow hue to the artery.  At this point, the patient's right greater saphenous vein was identified under Sonosite guidance.  Skip incisions was made over the greater saphenous vein from the saphenofemoral junction down to knee.  The vein conduit was found to be adequate with size: 3-4 mm.  The vein appeared to be diseased with a white color and evidence of prior phlebitis.  Side branches of greater saphenous vein were tied off with 4-0 silk or clipped with small titanium clips.  The saphenofemoral junction was clamped and the greater saphenous vein was transected.  The saphenofemoral junction was oversewn in a double layer with a running stitch of 5-0 prolene.  The distal extent of this conduit was tied off at the level of knee and the conduit transected proximally.  The harvest vein conduit was soaked in heparinized  saline solution.  At this point, I hydrodistended the vein conduit and oversewed a few side branches with 7-0 Prolene.  I blunt removed the proximal vein valve in this conduit.  At this  point, I gave the patient a bolus of 9000 units of Heparin.  After waiting 3 minutes, I clamped the right proximal common femoral artery and placed the superficial femoral artery and profunda femoral arteries under tension.  I made an arteriotomy with a 11-blade in the distal common femoral artery and extended it proximally and distally.  The proximal extent of the bypass conduit was spatulated to the geometry of the arteriotomy.  The conduit was sewn to the common femoral artery in an end-to-side configuration with a running stitch of 5-0 Prolene.  Prior to completing this anastomosis, I allowed the external iliac artery, profunda femoral artery, and superficial femoral artery to backbleed.  No clot was noted.  I completed the anastomosis in the usual fashion.  I then passed valulotomes twice through this conduit, lysing only a few valves.  There was extensive internal scar evident during this process in the distal vein conduit.  I clamped the vein proximally.  At this point, I tunneled from the above-the-knee popliteal exposure to the right groin with a Gore tunneler.  The vein conduit was sewn to the inner cannula and passed through the tunnel, taking care to maintain the orientation of the conduit.  I removed the metal tunnel and sharply released the vein conduit from the inner cannula.  The above-the-knee popliteal artery was then placed under tension proximally and distally with vessel loops.  An arteriotomy was made with a 11-blade and were extended proximally and distally with a Pott's scissor.  The distal end of the conduit was spatulated, shortening the conduit in the process.  The conduit was sewn to the to the artery in an end-to-side configuration with a running stitch of 6-0 Prolene.  Prior to completing the anastomosis, I backbled both ends of the artery.  There was: no clots evident.  The conduit was allowed to bleed in an antegrade fashion.  There was: no clots evident and good antegrade bleeding.   I placed Avitene in the wound.  I removed all vessel loops and clamps.  There was a faintly palpable anterior tibial artery pulse.  Distally there were biphasic posterior tibial artery and anterior tibial artery signals.    At this point, all wound were washed out.  Bleeding points were controlled with electrocautery, and suture repair of active bleeding points in the suture line.  No further bleeding was present.  The above-the-knee exposure was repaired with a layer of 2-0 Vicryl, a single layer of 3-0 Vicryl, and a running subcuticular of 4-0 Monocryl in the skin layer.  In a similar fashion, the groin was repaired with a double layer of 2-0 Vicryl, a double layer of 3-0 Vicryl, and a running subcuticular of 4-0 Monocryl in the skin layer.  All skin incisions were cleaned, dried, and reinforced with Dermabond.    The vein harvest sites were washed out and bleeding controlled with electrocautery.  The subcutaneous tissue in each harvest incisions was reapproximated with a running stitch of 3-0 Vicryl.  The skin at each incision was reapproximated with staples.  A sterile dressing was applied to each incision.   COMPLICATIONS: none  CONDITION: stable  Adele Barthel, MD [facility] Office: [phone] Pager: [phone]  09/19/2015, 5:50 PM

## 2015-09-19 NOTE — Transfer of Care (Signed)
Immediate Anesthesia Transfer of Care Note  Patient: Ozell H Reifsteck  Procedure(s) Performed: Procedure(s): RIGHT COMMON FEMORAL-ABOVE KNEE POPLITEAL ARTERY    BYPASS GRAFT  (Right) WITH NON REVERSE RIGHT GREATER SAPHENOUS VEIN HARVEST (Right)  Patient Location: PACU  Anesthesia Type:General  Level of Consciousness: awake and alert   Airway & Oxygen Therapy: Patient Spontanous Breathing and Patient connected to nasal cannula oxygen  Post-op Assessment: Report given to RN and Post -op Vital signs reviewed and stable  Post vital signs: Reviewed and stable  Last Vitals:  Filed Vitals:   09/19/15 1038  BP: 141/73  Pulse: 71  Temp: 36.4 C  Resp: 20    Last Pain: There were no vitals filed for this visit.       Complications: No apparent anesthesia complications

## 2015-09-19 NOTE — Progress Notes (Signed)
Pt states he had a "teaspoon" of coffee this morning with a little milk and sugar at [place] [facility].

## 2015-09-20 ENCOUNTER — Encounter (HOSPITAL_COMMUNITY): Payer: Self-pay | Admitting: Vascular Surgery

## 2015-09-20 ENCOUNTER — Encounter (HOSPITAL_COMMUNITY): Payer: PPO

## 2015-09-20 DIAGNOSIS — I998 Other disorder of circulatory system: Secondary | ICD-10-CM | POA: Diagnosis not present

## 2015-09-20 LAB — BASIC METABOLIC PANEL
Anion gap: 5 (ref 5–15)
BUN: 14 mg/dL (ref 6–20)
CHLORIDE: 106 mmol/L (ref 101–111)
CO2: 27 mmol/L (ref 22–32)
CREATININE: 1 mg/dL (ref 0.61–1.24)
Calcium: 9.3 mg/dL (ref 8.9–10.3)
GFR calc Af Amer: >60 mL/min (ref >60)
GFR calc non Af Amer: >60 mL/min (ref >60)
Glucose, Bld: 146 mg/dL — ABNORMAL HIGH (ref 65–99)
Potassium: 4.3 mmol/L (ref 3.5–5.1)
SODIUM: 138 mmol/L (ref 135–145)

## 2015-09-20 LAB — CBC
HCT: 46 % (ref 39.0–52.0)
HEMOGLOBIN: 14.9 g/dL (ref 13.0–17.0)
MCH: 29.3 pg (ref 26.0–34.0)
MCHC: 32.4 g/dL (ref 30.0–36.0)
MCV: 90.6 fL (ref 78.0–100.0)
PLATELETS: 257 10*3/uL (ref 150–400)
RBC: 5.08 MIL/uL (ref 4.22–5.81)
RDW: 14.6 % (ref 11.5–15.5)
WBC: 11.5 10*3/uL — ABNORMAL HIGH (ref 4.0–10.5)

## 2015-09-20 NOTE — Evaluation (Signed)
Physical Therapy Evaluation/Discharge Patient Details Name: Derrick Moon MRN: [id number] DOB: 08-Sep-1940 Today's Date: 09/20/2015   History of Present Illness  75 y.o. male admitted to [facility] on 09/19/15 for Right common femoral artery to above-the-knee popliteal artery bypass with ipsilateral non-reversed greater saphenous vein.  Pt with significant PMHx of COPD, bladder CA, HTN, dizziness, dyspnea on exertion, AAA, BIl RTC repair, R reverse TSA, left biceps tendon repair, and R foot surgery.  Clinical Impression  Pt is ambulating well despite post op pain.  He has a sore right toe that is affecting him more than his surgical site during gait.  [place].  He is mobilizing well and has no further acute PT or f/u PT needs at this time.  PT to sign off.  Of note, he did have some mild DOE during gait.  O2 sats were 87-88% on RA, HR 98 bpm.      Follow Up Recommendations No PT follow up    Equipment Recommendations  None recommended by PT    Recommendations for Other Services   NA    Precautions / Restrictions Restrictions Weight Bearing Restrictions: No      Mobility  Bed Mobility Overal bed mobility: Independent                Transfers Overall transfer level: Independent Equipment used: None                Ambulation/Gait Ambulation/Gait assistance: Modified independent (Device/Increase time) Ambulation Distance (Feet): 450 Feet Assistive device: None Gait Pattern/deviations: Step-through pattern   Gait velocity interpretation: at or above normal speed for age/gender General Gait Details: Pt has an abnormal gait pattern, likely due to arthritis in bil knees, right hip which he reports he may have to get these joints replaced.    Stairs Stairs: Yes Stairs assistance: Modified independent (Device/Increase time) Stair Management: One rail Right;Forwards;Step to pattern Number of Stairs: 5 General stair comments: With railing, pt  had no difficulty ascending and descending the stairs.  Verbal cues for step to pattern and to lead up with the less painful leg and down with the painful leg.          Balance Overall balance assessment: Needs assistance Sitting-balance support: Feet supported;No upper extremity supported Sitting balance-Leahy Scale: Normal     Standing balance support: No upper extremity supported Standing balance-Leahy Scale: Good                   Standardized Balance Assessment Standardized Balance Assessment : Dynamic Gait Index   Dynamic Gait Index Level Surface: Normal Change in Gait Speed:  (NT) Gait with Horizontal Head Turns: Normal Gait with Vertical Head Turns: Normal Gait and Pivot Turn: Normal Step Over Obstacle: Normal Step Around Obstacles: Normal Steps: Moderate Impairment       Pertinent Vitals/Pain Pain Assessment: Faces Faces Pain Scale: Hurts little more Pain Location: groin Pain Descriptors / Indicators: Aching;Burning;Grimacing;Guarding Pain Intervention(s): Limited activity within patient's tolerance;Monitored during session;Patient requesting pain meds-RN notified    Home Living Family/patient expects to be discharged to:: Private residence Living Arrangements: Spouse/significant other Available Help at Discharge: Family Type of Home: House       Home Layout: Laundry or work area in [place]: Environmental consultant - 2 wheels;Cane - single point;Wheelchair - manual (has lots of equipment.)      Prior Function Level of Independence: Independent         Comments: 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           Lower Extremity Assessment: RLE deficits/detail RLE Deficits / Details: right leg limited by post op pain at the  him.  Functionally no buckling or very abnormal gait pattern.     Cervical / Trunk Assessment: Normal  Communication   Communication: No difficulties  Cognition Arousal/Alertness: Awake/alert Behavior During Therapy: WFL for tasks assessed/performed Overall Cognitive Status: Within Functional Limits for tasks assessed                               Assessment/Plan    PT Assessment Patent does not need any further PT services  PT Diagnosis Difficulty walking;Abnormality of gait;Generalized weakness;Acute pain         PT Goals (Current goals can be found in the Care Plan section) Acute Rehab PT Goals Patient Stated Goal: to go home tomorrow PT Goal Formulation: All assessment and education complete, DC therapy     End of Session Equipment Utilized During Treatment: Gait belt Activity Tolerance: Patient tolerated treatment well Patient left: in bed;with call bell/phone within reach Nurse Communication: Patient requests pain meds         Time: [id number] PT Time Calculation (min) (ACUTE ONLY): 21 min   Charges:   PT Evaluation $PT Eval Moderate Complexity: 1 Procedure          Campbell Kray B. Denali, [place], DPT [phone]   09/20/2015, 9:47 PM

## 2015-09-20 NOTE — Progress Notes (Signed)
   Daily Progress Note  Assessment/Planning: POD #1 s/p R CFA to AK pop bypass with ips NR GSV   Palpable pulses despite somewhat marginal conduit  Ok to transfer to floor  Already ambulated  Wean off oxygen  Stop MIVF  If pain controlled and off oxygen, home tomorrow   Subjective  - 1 Day Post-Op  Pain controlled, foot feels the same  Objective Filed Vitals:   09/19/15 2112 09/19/15 2151 09/20/15 0021 09/20/15 0357  BP:  135/84 131/70 137/65  Pulse:   65 61  Temp:   98 F (36.7 C) 98 F (36.7 C)  TempSrc:   Oral Oral  Resp:   19 23  Height:      Weight:      SpO2: 96%  98% 96%    Intake/Output Summary (Last 24 hours) at 09/20/15 0733 Last data filed at 09/20/15 0700  Gross per 24 hour  Intake   3605 ml  Output   1860 ml  Net   1745 ml    PULM  CTAB CV  RRR GI  soft, NTND VASC  Groin and distal thigh Inc c/d/i, vein harvest incision bandaged, palpable DP, improved doppler signal in L foot  Laboratory CBC    Component Value Date/Time   WBC 11.5* 09/20/2015 0034   HGB 14.9 09/20/2015 0034   HCT 46.0 09/20/2015 0034   PLT 257 09/20/2015 0034    BMET    Component Value Date/Time   NA 138 09/20/2015 0034   K 4.3 09/20/2015 0034   CL 106 09/20/2015 0034   CO2 27 09/20/2015 0034   GLUCOSE 146* 09/20/2015 0034   BUN 14 09/20/2015 0034   CREATININE 1.00 09/20/2015 0034   CALCIUM 9.3 09/20/2015 0034   GFRNONAA >60 09/20/2015 0034   GFRAA >60 09/20/2015 0034    Adele Barthel, MD [facility]: [phone] Pager: [phone]  09/20/2015, 7:33 AM

## 2015-09-21 ENCOUNTER — Ambulatory Visit (HOSPITAL_COMMUNITY): Payer: PPO

## 2015-09-21 MED ORDER — OXYCODONE-ACETAMINOPHEN 5-325 MG PO TABS: 1.0000 | ORAL_TABLET | Freq: Four times a day (QID) | ORAL | Status: DC | PRN

## 2015-09-21 NOTE — Progress Notes (Signed)
09/21/2015 10:45 AM Discharge AVS meds taken today and those due this evening reviewed.  Follow-up appointments and when to call md reviewed.  D/C IV and TELE.  Questions and concerns addressed.   D/C home per orders. [place]

## 2015-09-21 NOTE — Progress Notes (Addendum)
   Daily Progress Note  Assessment/Planning: POD #2 s/p R CFA to AK pop BPG   Strong pulses today  Incisions look good  Ambulating without oxygen  Pain well controlled  Ok to D/C home  Staples out on July 7 in the office  I will see the patient in the office on July 14   Subjective  - 2 Days Post-Op  Pain well controlled, walked 1/2 mi yesterday  Objective Filed Vitals:   09/20/15 1941 09/20/15 2034 09/21/15 0514 09/21/15 0749  BP:  110/92 152/68   Pulse: 68 71 65   Temp:  98.2 F (36.8 C) 98.4 F (36.9 C)   TempSrc:  Oral Oral   Resp: 20 20 18    Height:      Weight:      SpO2: 93% 92% 97% 88%   No intake or output data in the 24 hours ending 09/21/15 0834  PULM  CTAB CV  RRR GI  soft, NTND VASC  R groin and thigh inc c/d/i, stapled inc x 3 c/d/i; R foot with palpable DP and PT  Laboratory CBC    Component Value Date/Time   WBC 11.5* 09/20/2015 0034   HGB 14.9 09/20/2015 0034   HCT 46.0 09/20/2015 0034   PLT 257 09/20/2015 0034    BMET    Component Value Date/Time   NA 138 09/20/2015 0034   K 4.3 09/20/2015 0034   CL 106 09/20/2015 0034   CO2 27 09/20/2015 0034   GLUCOSE 146* 09/20/2015 0034   BUN 14 09/20/2015 0034   CREATININE 1.00 09/20/2015 0034   CALCIUM 9.3 09/20/2015 0034   GFRNONAA >60 09/20/2015 0034   GFRAA >60 09/20/2015 0034    Adele Barthel, MD [facility] Office: [phone] Pager: [phone]  09/21/2015, 8:34 AM

## 2015-09-21 NOTE — Anesthesia Postprocedure Evaluation (Signed)
Anesthesia Post Note  Patient: Derrick Moon  Procedure(s) Performed: Procedure(s) (LRB): RIGHT COMMON FEMORAL-ABOVE KNEE POPLITEAL ARTERY    BYPASS GRAFT  (Right) WITH NON REVERSE RIGHT GREATER SAPHENOUS VEIN HARVEST (Right)  Patient location during evaluation: PACU Anesthesia Type: General Level of consciousness: awake and alert Pain management: pain level controlled Vital Signs Assessment: post-procedure vital signs reviewed and stable Respiratory status: spontaneous breathing, nonlabored ventilation, respiratory function stable and patient connected to nasal cannula oxygen Cardiovascular status: blood pressure returned to baseline and stable Postop Assessment: no signs of nausea or vomiting Anesthetic complications: no     Last Vitals:  Filed Vitals:   09/20/15 2034 09/21/15 0514  BP: 110/92 152/68  Pulse: 71 65  Temp: 36.8 C 36.9 C  Resp: 20 18    Last Pain:  Filed Vitals:   09/21/15 0751  PainSc: Asleep   Pain Goal: Patients Stated Pain Goal: 0 (09/20/15 1941)               Tiajuana Amass

## 2015-09-21 NOTE — Care Management Important Message (Signed)
Important Message  Patient Details  Name: Derrick Moon MRN: [id number] Date of Birth: 1940/04/27   Medicare Important Message Given:  Yes    Nathen May 09/21/2015, 11:20 AM

## 2015-09-22 ENCOUNTER — Telehealth: Payer: Self-pay | Admitting: Vascular Surgery

## 2015-09-22 NOTE — Telephone Encounter (Signed)
-----   Message from Mena Goes, RN sent at 09/21/2015  9:35 AM EDT ----- Regarding: Levert Feinstein removal 7/7 and postop 7/14   ----- Message -----    From: Conrad Le Flore, MD    Sent: 09/21/2015   8:36 AM      To: [address]  KORDEL ARRIOLA [id number] January 08, 1941  F/u: 1/ Nurse visit for staple removal on 7/7 2/ Post-op visit with MD on 7/14

## 2015-09-22 NOTE — Telephone Encounter (Signed)
Sched staple removal 7/7 at 11 and MD 7/12 at 1:45. Spoke to pt to inform them of appts.

## 2015-09-24 NOTE — Discharge Summary (Signed)
[facility] Discharge Summary  Derrick Moon [birth year] 75 y.o. male  [id number]  Admission Date: 09/19/2015  Discharge Date: 09/21/2015  Physician: Adele Barthel, MD  Admission Diagnosis: Peripheral vascular disease with fourth and fifth toe ulcer I70.235  HPI:   This is a 75 y.o. male who presents with chief complaint: right foot pain and cramping. The patient does not have rest pain but has intermittent pain in his R 4th/5th toe ulcer. His ulceration has nearly healed. 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 The patient notes symptoms have not progressed. The patient's treatment regimen currently included: maximal medical management and wound care.   He has been cleared by Cardiology to proceed with bypass.  Hospital Course:  The patient was admitted to the hospital and taken to the operating room on 09/19/2015 and underwent: Right common femoral artery to above-the-knee popliteal artery bypass with ipsilateral non-reversed greater saphenous vein.    The patient tolerated the procedure well and was transported to the PACU in stable condition.   POD 1: The patient had palpable pedal pulses. He was ambulating in the halls. He was weaned off oxygen. He was transferred to the floor.   POD 2: His incisions looked good and he had strong pulses. He was ambulating without oxygen. His pain was well controlled. He was discharged home on POD 2 in good condition.    CBC    Component Value Date/Time   WBC 11.5* 09/20/2015 0034   RBC 5.08 09/20/2015 0034   HGB 14.9 09/20/2015 0034   HCT 46.0 09/20/2015 0034   PLT 257 09/20/2015 0034   MCV 90.6 09/20/2015 0034   MCH 29.3 09/20/2015 0034   MCHC 32.4 09/20/2015 0034   RDW 14.6 09/20/2015 0034   LYMPHSABS 2.3 09/28/2009 0531   MONOABS 0.9 09/28/2009 0531   EOSABS 0.3 09/28/2009 0531   BASOSABS 0.0 09/28/2009 0531    BMET    Component Value Date/Time   NA 138 09/20/2015 0034   K 4.3 09/20/2015 0034   CL 106 09/20/2015 0034   CO2 27 09/20/2015 0034   GLUCOSE 146* 09/20/2015 0034   BUN 14 09/20/2015 0034   CREATININE 1.00 09/20/2015 0034   CALCIUM 9.3 09/20/2015 0034   GFRNONAA >60 09/20/2015 0034   GFRAA >60 09/20/2015 0034     Discharge Instructions:   The patient is discharged to home with extensive instructions on wound care and progressive ambulation.  They are instructed not to drive or perform any heavy lifting until returning to see the physician in his office.  Discharge Instructions    Call MD for:  redness, tenderness, or signs of infection (pain, swelling, bleeding, redness, odor or green/yellow discharge around incision site)    Complete by:  As directed      Call MD for:  severe or increased pain, loss or decreased feeling  in affected limb(s)    Complete by:  As directed      Call MD for:  temperature >100.5    Complete by:  As directed      Discharge wound care:    Complete by:  As directed   Wash the groin wound with soap and water daily and pat dry. (No tub bath-only shower)  Then put a dry gauze or washcloth there to keep this area dry daily and as needed.  Do not use Vaseline or neosporin on your incisions.  Only use soap and water on your incisions and then protect and  keep dry.  Wash remaining incisions with soap and water daily and pat dry.     Driving Restrictions    Complete by:  As directed   No driving for 2 weeks     Increase activity slowly    Complete by:  As directed   Walk with assistance use walker or cane as needed     Lifting restrictions    Complete by:  As directed   No lifting for 2 weeks     Resume previous diet    Complete by:  As directed            Discharge Diagnosis:  Peripheral vascular disease with fourth and fifth toe ulcer I70.235  Secondary Diagnosis: Patient Active Problem List   Diagnosis Date Noted  . Atherosclerosis of native arteries of right leg with ulceration of other part of foot ([place])  09/19/2015  . Atherosclerosis of native arteries of the extremities with ulceration ([place]) 07/27/2015  . S/P shoulder replacement 07/07/2014  . Aortic ectasia, abdominal ([place]) 02/26/2012  . HYPERTENSION 02/26/2009  . DIVERTICULITIS, HX OF 02/26/2009   Past Medical History  Diagnosis Date  . AAA (abdominal aortic aneurysm) ([place])   . Arthritis     Osteo  . Hypertension     takes Amlodipine and Lisinpril daily  . Enlarged prostate     takes Flomax daily  . COPD (chronic obstructive pulmonary disease) ([facility])     Albuterol inhaler as needed  . Pneumonia     several times with most recent within 5 yrs ago  . History of bronchitis     > 72yrs ago  . History of dizziness     several yrs ago-was on Meclizine but has been off for several yrs-no problems since  . AAA (abdominal aortic aneurysm) ([place])   . Joint pain   . Joint swelling   . GERD (gastroesophageal reflux disease)     was on Prilosec but has been off for yrs-no issues now with reflux  . History of colon polyps     benign  . Urinary urgency   . Diverticulitis   . Nocturia   . Cataracts, bilateral   . Shortness of breath dyspnea     with exertion  . Sleep apnea     does not use cpap  . Anxiety     due to pain in leg  . Cancer [facility])     Bladder cancer  . Headache     migraines years ago  . Restless leg syndrome        Medication List    STOP taking these medications        ciprofloxacin 500 MG tablet  Commonly known as:  CIPRO      TAKE these medications        acetaminophen 500 MG tablet  Commonly known as:  TYLENOL  Take 1,000 mg by mouth daily as needed for moderate pain.     albuterol 108 (90 Base) MCG/ACT inhaler  Commonly known as:  PROVENTIL HFA;VENTOLIN HFA  Inhale 1-2 puffs into the lungs every 6 (six) hours as needed for wheezing or shortness of breath.     amLODipine 5 MG tablet  Commonly known as:  NORVASC  Take 5 mg by mouth at bedtime.     fluticasone 110 MCG/ACT inhaler  Commonly  known as:  FLOVENT HFA  Inhale 1 puff into the lungs 2 (two) times daily.     HALLS COUGH DROPS MT  Use as  directed 1 each in the mouth or throat 2 (two) times daily as needed (for drainage).     lisinopril 40 MG tablet  Commonly known as:  PRINIVIL,ZESTRIL  Take 40 mg by mouth daily.     oxyCODONE 5 MG immediate release tablet  Commonly known as:  Oxy IR/ROXICODONE  Take 5 mg by mouth at bedtime as needed for severe pain.     oxyCODONE-acetaminophen 5-325 MG tablet  Commonly known as:  PERCOCET/ROXICET  Take 1-2 tablets by mouth every 6 (six) hours as needed for moderate pain.     tamsulosin 0.4 MG Caps capsule  Commonly known as:  FLOMAX  Take 0.4 mg by mouth 2 (two) times daily.     tiotropium 18 MCG inhalation capsule  Commonly known as:  SPIRIVA  Place 18 mcg into inhaler and inhale daily.     vitamin C 500 MG tablet  Commonly known as:  ASCORBIC ACID  Take 500 mg by mouth daily.        Percocet #30 No Refill  Disposition: Home  Patient's condition: is Good   Follow up:  1. Dr. Bridgett Larsson in 2 weeks   Virgina Jock, PA-C [facility] [phone] 09/24/2015  8:37 AM   Addendum  I have independently interviewed and examined the patient, and I agree with the physician assistant's discharge summary.  This patient underwent a R CFA to AK pop bypass with ips NR GSV for lifestyle limiting claudication and slow healing foot ulcers.  His post-operative course was relatively rapid with good ambulation and weaning of oxygen by POD #2.  By discharge, all incisions were intact and patient had a palpable pulse in his right foot.  He will follow up in the office in 2 weeks for wound checks.  Adele Barthel, MD [facility] [place] Office: [phone] Pager: [phone]  09/30/2015, 10:38 PM    - For VQI Registry use --- Instructions: Press F2 to tab through selections.  Delete question if not applicable.   Post-op:  Wound  infection: No  Graft infection: No  Transfusion: No   New Arrhythmia: No Ipsilateral amputation: No, [ ]  Minor, [ ]  BKA, [ ]  AKA Discharge patency: [x ] Primary, [ ]  Primary assisted, [ ]  Secondary, [ ]  Occluded Patency judged by: [ ]  Dopper only, [ ]  Palpable graft pulse, [x ] Palpable distal pulse, [ ]  ABI inc. > 0.15, [ ]  Duplex D/C Ambulatory Status: Ambulatory  Complications: MI: No, [ ]  Troponin only, [ ]  EKG or Clinical CHF: No Resp failure:No, [ ]  Pneumonia, [ ]  Ventilator Chg in renal function: No, [ ]  Inc. Cr > 0.5, [ ]  Temp. Dialysis, [ ]  Permanent dialysis Stroke: No, [ ]  Minor, [ ]  Major Return to OR: No  Reason for return to OR: [ ]  Bleeding, [ ]  Infection, [ ]  Thrombosis, [ ]  Revision  Discharge medications: Statin use:  no ASA use:  no Plavix use:  no Beta blocker use: no Coumadin use: no

## 2015-09-28 ENCOUNTER — Ambulatory Visit: Payer: PPO | Admitting: *Deleted

## 2015-09-28 ENCOUNTER — Encounter: Payer: Self-pay | Admitting: Vascular Surgery

## 2015-09-28 DIAGNOSIS — I7025 Atherosclerosis of native arteries of other extremities with ulceration: Secondary | ICD-10-CM

## 2015-09-28 NOTE — Progress Notes (Signed)
Staples removed from right leg and groin.  Incision has healed but area around incision is pink-ish red and patient is very tender around incision site.  Steri-strips applied to incision sites.  Instructed patient to keep right leg elevated, clean and dry.  Advised patient to wash incision sites with Gold Dial soap and to call [facility] if he runs an elevated temperature or has questions or concerns.

## 2015-10-02 DIAGNOSIS — L97511 Non-pressure chronic ulcer of other part of right foot limited to breakdown of skin: Secondary | ICD-10-CM | POA: Diagnosis not present

## 2015-10-02 DIAGNOSIS — I739 Peripheral vascular disease, unspecified: Secondary | ICD-10-CM | POA: Diagnosis not present

## 2015-10-03 ENCOUNTER — Encounter: Payer: Self-pay | Admitting: Vascular Surgery

## 2015-10-03 ENCOUNTER — Ambulatory Visit (INDEPENDENT_AMBULATORY_CARE_PROVIDER_SITE_OTHER): Payer: Self-pay | Admitting: Vascular Surgery

## 2015-10-03 VITALS — BP 140/83 | HR 98 | Temp 97.4°F | Resp 16 | Ht 71.5 in | Wt 199.0 lb

## 2015-10-03 DIAGNOSIS — I70235 Atherosclerosis of native arteries of right leg with ulceration of other part of foot: Secondary | ICD-10-CM

## 2015-10-03 NOTE — Progress Notes (Signed)
    Postoperative Visit   History of Present Illness  Derrick Moon is a 75 y.o. year old male who presents for postoperative follow-up for: R CFA to AK pop BPG w/ ips NR GSV (Date: 09/19/15).  The patient's wounds are mostly healed.  The patient notes intermittent cramping in both legs and left arm.  The patient is able to complete their activities of daily living.  The patient's current symptoms are: intermittent drainage from right groin and swelling in vein harvest sites.  No fever or chills noted..  For VQI Use Only  PRE-ADM LIVING: Home  AMB STATUS: Ambulatory  Physical Examination  Filed Vitals:   10/03/15 1410  BP: 140/83  Pulse: 98  Temp: 97.4 F (36.3 C)  Resp: 16     RLE: R groin has a small ballotable fluid collection, veins harvest incisions also with mild swelling, AK pop exposure healed, wounds in R foot are healed, pedal pulses are faintly palpable, edema 1-2+  Medical Decision Making  Derrick Moon is a 75 y.o. year old male who presents s/p R CFA to AK pop BPG w/ ips NR GSV .  The patient's bypass incisions are healing appropriately with resolution of pre-operative symptoms.  Pt might have a small hematoma vs seroma in the R groin.  We discussed the mgmt of such.  The patient is already washing off the incision multiple times during the day.  Will have the patient come back in 4 weeks to re-check his healing.  Will also get his ABI at that time, as not done in the hospital.  Thank you for allowing [place] to participate in this patient's care.  Adele Barthel, MD, FACS [facility] [place] Office: [phone] Pager: [phone]

## 2015-10-04 NOTE — Addendum Note (Signed)
Addended by: Thresa Ross C on: 10/04/2015 11:16 AM   Modules accepted: Orders

## 2015-10-24 DIAGNOSIS — G510 Bell's palsy: Secondary | ICD-10-CM | POA: Diagnosis not present

## 2015-10-24 DIAGNOSIS — I739 Peripheral vascular disease, unspecified: Secondary | ICD-10-CM | POA: Diagnosis not present

## 2015-10-24 DIAGNOSIS — J449 Chronic obstructive pulmonary disease, unspecified: Secondary | ICD-10-CM | POA: Diagnosis not present

## 2015-10-24 DIAGNOSIS — Z79899 Other long term (current) drug therapy: Secondary | ICD-10-CM | POA: Diagnosis not present

## 2015-10-24 DIAGNOSIS — Z79891 Long term (current) use of opiate analgesic: Secondary | ICD-10-CM | POA: Diagnosis not present

## 2015-10-24 DIAGNOSIS — Z1159 Encounter for screening for other viral diseases: Secondary | ICD-10-CM | POA: Diagnosis not present

## 2015-11-05 NOTE — Progress Notes (Signed)
    Postoperative Visit   History of Present Illness  Derrick Moon is a 75 y.o. (Aug 23, 1940) male  who presents for postoperative follow-up for: R CFA to AK pop BPG w/ ips NR GSV (Date: 09/19/15).  The patient's wounds are: healed.  The patient is able to complete their activities of daily living.  The patient's current symptoms are: back pain from heavy lifting recently.  The patient continues to complain of cramping in calves which extends to his thigh with walking.  For VQI Use Only  PRE-ADM LIVING: Home  AMB STATUS: Ambulatory   Physical Examination  Vitals:   11/09/15 1135  BP: 136/85  Pulse: 68  SpO2: 96%  Weight: 201 lb 12.8 oz (91.5 kg)  Height: 5' 11.5" (1.816 m)   GEN: alert and oriented, appear dyspneic  PULM: minimal flow at base of lungs, faint wheezing superiorly, sym expansion  RLE: all incisions healed, warm foot with palpable AT and PT  ABI (Date: 11/05/2015)  R:   ABI: 1.07 (0.40),   DP: tri,   PT: bi,   TBI: 0.63  L:   ABI: 0.68 (0.61),   DP: mono,   PT: mono,   TBI: 0.43   Medical Decision Making  Derrick Moon is a 75 y.o. (October 07, 1940) male  who presents s/p R CFA to AK pop BPG w/ ips NR GSV done for CLI, SOB   The patient's bypass incisions are healing appropriately.  The patient interdigital ulcer has healed.  The persistence of R calf and thigh cramping in the setting essentially fully restored right leg perfusion suggest his sx are NOT arterial in nature.  Given his prior significant back problems, I suspect this might be the next area of exploration for an etiology for his sx.  Pt has an appt this afternoon with Dr. Willey Blade, so will defer to his PCP further evaluation of his pulmonary status.  Will bring pt back in 3 months for surveillance studies: BLE ABI, RLE arterial duplex  Thank you for allowing [place] to participate in this patient's care.  Adele Barthel, MD, FACS [facility]  [place] Office: [phone] Pager: [phone]

## 2015-11-06 ENCOUNTER — Encounter: Payer: Self-pay | Admitting: Vascular Surgery

## 2015-11-09 ENCOUNTER — Other Ambulatory Visit: Payer: Self-pay | Admitting: *Deleted

## 2015-11-09 ENCOUNTER — Ambulatory Visit (HOSPITAL_COMMUNITY)
Admission: RE | Admit: 2015-11-09 | Discharge: 2015-11-09 | Disposition: A | Discharge status: Home / Self Care | Payer: PPO | Source: Ambulatory Visit | Attending: Vascular Surgery | Admitting: Vascular Surgery

## 2015-11-09 ENCOUNTER — Encounter: Payer: Self-pay | Admitting: Vascular Surgery

## 2015-11-09 ENCOUNTER — Ambulatory Visit (INDEPENDENT_AMBULATORY_CARE_PROVIDER_SITE_OTHER): Payer: Self-pay | Admitting: Vascular Surgery

## 2015-11-09 VITALS — BP 136/85 | HR 68 | Ht 71.5 in | Wt 201.8 lb

## 2015-11-09 DIAGNOSIS — I70235 Atherosclerosis of native arteries of right leg with ulceration of other part of foot: Secondary | ICD-10-CM | POA: Diagnosis not present

## 2015-11-09 DIAGNOSIS — J449 Chronic obstructive pulmonary disease, unspecified: Secondary | ICD-10-CM | POA: Insufficient documentation

## 2015-11-09 DIAGNOSIS — R0989 Other specified symptoms and signs involving the circulatory and respiratory systems: Secondary | ICD-10-CM | POA: Diagnosis not present

## 2015-11-09 DIAGNOSIS — I10 Essential (primary) hypertension: Secondary | ICD-10-CM | POA: Insufficient documentation

## 2015-11-09 DIAGNOSIS — F419 Anxiety disorder, unspecified: Secondary | ICD-10-CM | POA: Insufficient documentation

## 2015-11-09 DIAGNOSIS — K219 Gastro-esophageal reflux disease without esophagitis: Secondary | ICD-10-CM | POA: Insufficient documentation

## 2015-11-09 DIAGNOSIS — G4730 Sleep apnea, unspecified: Secondary | ICD-10-CM | POA: Diagnosis not present

## 2015-11-09 DIAGNOSIS — I739 Peripheral vascular disease, unspecified: Secondary | ICD-10-CM

## 2015-11-09 DIAGNOSIS — G510 Bell's palsy: Secondary | ICD-10-CM | POA: Diagnosis not present

## 2015-11-09 DIAGNOSIS — I7025 Atherosclerosis of native arteries of other extremities with ulceration: Secondary | ICD-10-CM

## 2015-11-09 DIAGNOSIS — I700 Atherosclerosis of aorta: Secondary | ICD-10-CM | POA: Diagnosis not present

## 2015-11-09 DIAGNOSIS — R938 Abnormal findings on diagnostic imaging of other specified body structures: Secondary | ICD-10-CM | POA: Diagnosis not present

## 2015-11-13 DIAGNOSIS — I739 Peripheral vascular disease, unspecified: Secondary | ICD-10-CM | POA: Diagnosis not present

## 2015-11-13 DIAGNOSIS — L97511 Non-pressure chronic ulcer of other part of right foot limited to breakdown of skin: Secondary | ICD-10-CM | POA: Diagnosis not present

## 2015-11-29 NOTE — Progress Notes (Signed)
This encounter was created in error - please disregard.

## 2015-12-12 ENCOUNTER — Ambulatory Visit (INDEPENDENT_AMBULATORY_CARE_PROVIDER_SITE_OTHER)
Admission: RE | Admit: 2015-12-12 | Discharge: 2015-12-12 | Disposition: A | Discharge status: Home / Self Care | Payer: PPO | Source: Ambulatory Visit | Attending: Internal Medicine | Admitting: Internal Medicine

## 2015-12-12 ENCOUNTER — Encounter: Payer: Self-pay | Admitting: Internal Medicine

## 2015-12-12 ENCOUNTER — Ambulatory Visit (INDEPENDENT_AMBULATORY_CARE_PROVIDER_SITE_OTHER): Payer: PPO | Admitting: Internal Medicine

## 2015-12-12 VITALS — BP 126/74 | HR 78 | Ht 71.5 in | Wt 198.0 lb

## 2015-12-12 DIAGNOSIS — Z23 Encounter for immunization: Secondary | ICD-10-CM

## 2015-12-12 DIAGNOSIS — I10 Essential (primary) hypertension: Secondary | ICD-10-CM

## 2015-12-12 DIAGNOSIS — R0600 Dyspnea, unspecified: Secondary | ICD-10-CM

## 2015-12-12 DIAGNOSIS — J449 Chronic obstructive pulmonary disease, unspecified: Secondary | ICD-10-CM | POA: Diagnosis not present

## 2015-12-12 DIAGNOSIS — R0609 Other forms of dyspnea: Secondary | ICD-10-CM

## 2015-12-12 MED ORDER — IRBESARTAN 150 MG PO TABS: 150.0000 mg | ORAL_TABLET | Freq: Every day | ORAL | 11 refills | Status: DC

## 2015-12-12 NOTE — Assessment & Plan Note (Addendum)
-   PFTs  05/27/12   FEV1  2.04 (62%) ratio 54  - 12/12/2015  Walked RA x 3 laps @ 185 ft each stopped due to  Leg cramps, no sob, no desats fast pace  Trial off acei 12/12/2015 >>>   Symptoms are markedly disproportionate to objective findings and not clear this is a lung problem but pt does appear to have difficult airway management issues.  DDX of  difficult airways management almost all start with A and  include Adherence, Ace Inhibitors, Acid Reflux, Active Sinus Disease, Alpha 1 Antitripsin deficiency, Anxiety masquerading as Airways dz,  ABPA,  Allergy(esp in young), Aspiration (esp in elderly), Adverse effects of meds,  Active smokers, A bunch of PE's (a small clot burden can't cause this syndrome unless there is already severe underlying pulm or vascular dz with poor reserve) plus two Bs  = Bronchiectasis and Beta blocker use..and one C= CHF   ? acei effects > top of the list of suspects since  spiiva didn't help/ am congestion and noisy breathing bothers his wife more than it does the patient   ? Allergy/asthma > try just use saba prn but return for repeat pfts off spiriva/ teach hfa  ? Adverse effect of dpi > try off spiriva dpi, consider stiolto trial of return depending on response to rx   ? Acid (or non-acid) GERD > always difficult to exclude as up to 75% of pts in some series report no assoc GI/ Heartburn symptoms> rec  iet restrictions/ reviewed and instructions given in writing.  ? chf > excluded by cards eval   Total time devoted to counseling  = 35/47m review case with pt/ discussion of options/alternatives/ personally creating written instructions  in presence of pt  then going over those specific  Instructions directly with the pt including how to use all of the meds but in particular covering each new medication in detail and the difference between the maintenance/automatic meds and the prns using an action plan format for the latter.

## 2015-12-12 NOTE — Patient Instructions (Addendum)
Please remember to go to the x-ray department downstairs for your tests - we will call you with the results when they are available.  avapro  150 mg one daily - if light headed when you stand up break it in half   Stop spiriva and lisinopril and the halls for now  GERD (REFLUX)  is an extremely common cause of respiratory symptoms just like yours , many times with no obvious heartburn at all.    It can be treated with medication, but also with lifestyle changes including elevation of the head of your bed (ideally with 6 inch  bed blocks),  Smoking cessation, avoidance of late meals, excessive alcohol, and avoid fatty foods, chocolate, peppermint, colas, red wine, and acidic juices such as orange juice.  NO MINT OR MENTHOL PRODUCTS SO NO COUGH DROPS   USE SUGARLESS CANDY INSTEAD (Jolley ranchers or Stover's or Life Savers) or even ice chips will also do - the key is to swallow to prevent all throat clearing. NO OIL BASED VITAMINS - use powdered substitutes.  Only use your albuterol as a rescue medication to be used if you can't catch your breath by resting or doing a relaxed purse lip breathing pattern.  - The less you use it, the better it will work when you need it. - Ok to use up to 2 puffs  every 4 hours if you must but call for immediate appointment if use goes up over your usual need - Don't leave home without it !!  (think of it like the spare tire for your car)   Please remember to go to the  x-ray department downstairs for your tests - we will call you with the results when they are available.      Please schedule a follow up office visit in 4 weeks, sooner if needed with inhaler in hand  - needs spirometry on return

## 2015-12-12 NOTE — Progress Notes (Signed)
Subjective:     Patient ID: Derrick Moon, male   DOB: 1940-11-05,   MRN: [id number]  HPI  89 yowm quit smoking cigs 1990 / still smoking some cigars referred to pulmonary clinic 12/12/2015 by Dr   Willey Blade for sob s/p neg cards eval by Dr Dwana Curd (though doen'st look like pt completed the myocardial perfusion study scheduled for 08/15/15    12/12/2015 1st [facility] office visit/ Dhanvi Boesen   Chief Complaint  Patient presents with  . Pulmonary Consult    Referred by Dr. [name]. Pt c/o dizziness with exertion for the past 6 months. His spouse states after he has been outside working, he comes in SOB.  He has prod cough with clear sputum in the am's.   variably noisy breathing x 6 months seems to bother wife more than husband  MMRC2 = can't walk a nl pace on a flat grade s sob but does fine slow and flat eg grocery shopping  Sleeping fine off cpap but wakes up every am with throat congestion/ uses halls  spiriva not helping / hfa may help/ prednisone does not help   No obvious day to day or daytime variability or assoc excess/ purulent sputum or mucus plugs or hemoptysis or cp or chest tightness, subjective wheeze or overt sinus or hb symptoms. No unusual exp hx or h/o childhood pna/ asthma or knowledge of premature birth.  Sleeping ok without nocturnal  or early am exacerbation  of respiratory  c/o's or need for noct saba. Also denies any obvious fluctuation of symptoms with weather or environmental changes or other aggravating or alleviating factors except as outlined above   Current Medications, Allergies, Complete Past Medical History, Past Surgical History, Family History, and Social History were reviewed in [employer] record.  ROS  The following are not active complaints unless bolded sore throat, dysphagia, dental problems, itching, sneezing,  nasal congestion or excess/ purulent secretions, ear ache,   fever, chills, sweats, unintended wt loss, classically  pleuritic or exertional cp,  orthopnea pnd or leg swelling, presyncope, palpitations, abdominal pain, anorexia, nausea, vomiting, diarrhea  or change in bowel or bladder habits, change in stools or urine, dysuria,hematuria,  rash, arthralgias, visual complaints, headache, numbness, weakness or ataxia or problems with walking or coordination,  change in mood/affect or memory.              Review of Systems     Objective:   Physical Exam  amb hoarse wm nad  Wt Readings from Last 3 Encounters:  12/12/15 198 lb (89.8 kg)  11/09/15 201 lb 12.8 oz (91.5 kg)  10/03/15 199 lb (90.3 kg)    Vital signs reviewed    HEENT: poor  dentition, nl turbinates, and oropharynx. Nl external ear canals without cough reflex   NECK :  without JVD/Nodes/TM/ nl carotid upstrokes bilaterally   LUNGS: no acc muscle use,  Nl contour chest / insp and exp rhonchi bilaterally with lots of upper airway noise   CV:  RRR  no s3 or murmur or increase in P2, no edema   ABD:  soft and nontender with nl inspiratory excursion in the supine position. No bruits or organomegaly, bowel sounds nl  MS:  Nl gait/ ext warm without deformities, calf tenderness, cyanosis or clubbing No obvious joint restrictions   SKIN: warm and dry without lesions    NEURO:  alert, approp, nl sensorium with  no motor deficits     CXR PA and Lateral:  12/12/2015 :    I personally reviewed images and agree with radiology impression as follows:    COPD changes without infiltrate.     Assessment:

## 2015-12-12 NOTE — Assessment & Plan Note (Signed)
In the best review of chronic cough to date ( NEJM 2016 [id number] [phone]) ,  ACEi are now felt to cause cough in up to  20% of pts which is a 4 fold increase from previous reports and does not include the variety of non-specific complaints we see in pulmonary clinic in pts on ACEi but previously attributed to another dx like  Copd/asthma (note not better on spiriva)  and  include PNDS, throat and chest congestion, "bronchitis", unexplained dysopnea and noct "strangling" sensations, and hoarseness, but also  atypical /refractory GERD symptoms like dysphagia and "bad heartburn"   The only way I know  to prove this is not an "ACEi Case" is a trial off ACEi x a minimum of 4 weeks then regroup.   Try avapro 150 mg daily, ok to take one half daily if too strong

## 2015-12-12 NOTE — Assessment & Plan Note (Signed)
-   PFTs  05/27/12   FEV1  2.04(62%) ratio 54  - 12/12/2015 try off spiriva since pt says not working   A general rule of thumb is that with fev1 > 2 liters he should be sob only with exercise, not adls as his hx suggests   If worse off spiriva then rechallenge with the respimat  With laba/lama this time = stiolto

## 2015-12-13 NOTE — Progress Notes (Signed)
Spoke with pt and notified of results per Dr. Wert. Pt verbalized understanding and denied any questions. 

## 2016-01-09 ENCOUNTER — Encounter: Payer: Self-pay | Admitting: Internal Medicine

## 2016-01-09 ENCOUNTER — Ambulatory Visit (INDEPENDENT_AMBULATORY_CARE_PROVIDER_SITE_OTHER): Payer: PPO | Admitting: Internal Medicine

## 2016-01-09 VITALS — BP 124/78 | HR 80 | Ht 71.0 in | Wt 198.0 lb

## 2016-01-09 DIAGNOSIS — J449 Chronic obstructive pulmonary disease, unspecified: Secondary | ICD-10-CM | POA: Diagnosis not present

## 2016-01-09 DIAGNOSIS — I10 Essential (primary) hypertension: Secondary | ICD-10-CM

## 2016-01-09 MED ORDER — BUDESONIDE-FORMOTEROL FUMARATE 160-4.5 MCG/ACT IN AERO: 2.0000 | INHALATION_SPRAY | Freq: Two times a day (BID) | RESPIRATORY_TRACT | 11 refills | Status: DC

## 2016-01-09 NOTE — Progress Notes (Signed)
Subjective:     Patient ID: Derrick Moon, male   DOB: 1940-05-27   MRN: [id number]      .Brief patient profile:  74 yowm quit smoking cigs 1990 / still smoking some cigars referred to pulmonary clinic 12/12/2015 by Dr   Willey Blade for sob s/p neg cards eval by Dr Dwana Curd (though doen'st look like pt completed the myocardial perfusion study scheduled for 08/15/15)   .History of Present Illness  12/12/2015 1st [facility] office visit/ [facility]   Chief Complaint  Patient presents with  . Pulmonary Consult    Referred by Dr. [name]. Pt c/o dizziness with exertion for the past 6 months. His spouse states after he has been outside working, he comes in SOB.  He has prod cough with clear sputum in the am's.   variably noisy breathing x 6 months seems to bother wife more than husband  MMRC2 = can't walk a nl pace on a flat grade s sob but does fine slow and flat eg grocery shopping  Sleeping fine off cpap but wakes up every am with throat congestion/ uses halls  spiriva not helping / hfa may help/ prednisone does not help rec avapro  150 mg one daily - if light headed when you stand up break it in half  Stop spiriva and lisinopril and the halls for now GERD diet Only use your albuterol as a rescue medication     01/09/2016  f/u ov/[facility] re:  GOLD III, no maint rx/using saba bid most days / still smoking cigars  Chief Complaint  Patient presents with  . Shortness of Breath    Breathing is improved since last OV. Reports slight SOB and chest congestion. Denies  chest tightness, wheezing or coughing.   tends to needVentolin first thing am for am "congestion" Can now walk the dog s sob/ cp = MMRC2 = can't walk a nl pace on a flat grade s sob but does fine slow and flat eg shopping/ walking the dog        No obvious day to day or daytime variability or assoc excess/ purulent sputum or mucus plugs or hemoptysis or cp or chest tightness, subjective wheeze or overt sinus or hb symptoms. No  unusual exp hx or h/o childhood pna/ asthma or knowledge of premature birth.  Sleeping ok without nocturnal  or early am exacerbation  of respiratory  c/o's or need for noct saba. Also denies any obvious fluctuation of symptoms with weather or environmental changes or other aggravating or alleviating factors except as outlined above   Current Medications, Allergies, Complete Past Medical History, Past Surgical History, Family History, and Social History were reviewed in [employer] record.  ROS  The following are not active complaints unless bolded sore throat, dysphagia, dental problems, itching, sneezing,  nasal congestion or excess/ purulent secretions, ear ache,   fever, chills, sweats, unintended wt loss, classically pleuritic or exertional cp,  orthopnea pnd or leg swelling, presyncope, palpitations, abdominal pain, anorexia, nausea, vomiting, diarrhea  or change in bowel or bladder habits, change in stools or urine, dysuria,hematuria,  rash, arthralgias, visual complaints, headache, numbness, weakness or ataxia or problems with walking or coordination,  change in mood/affect or memory.                  Objective:   Physical Exam  amb wm nad   01/09/2016      198   12/12/15 198 lb (89.8 kg)  11/09/15 201 lb  12.8 oz (91.5 kg)  10/03/15 199 lb (90.3 kg)    Vital signs reviewed - note bp fine/ sats 91% RA on arrival     HEENT: poor  dentition, nl turbinates, and oropharynx. Nl external ear canals without cough reflex   NECK :  without JVD/Nodes/TM/ nl carotid upstrokes bilaterally   LUNGS: no acc muscle use,  Nl contour chest /   CV:  RRR  no s3 or murmur or increase in P2, no edema   ABD:  soft and nontender with nl inspiratory excursion in the supine position. No bruits or organomegaly, bowel sounds nl  MS:  Nl gait/ ext warm without deformities, calf tenderness, cyanosis or clubbing No obvious joint restrictions   SKIN: warm and dry without  lesions    NEURO:  alert, approp, nl sensorium with  no motor deficits     CXR PA and Lateral:   12/12/2015 :    I personally reviewed images and agree with radiology impression as follows:    COPD changes without infiltrate.     Assessment:     Outpatient Encounter Prescriptions as of 01/09/2016  Medication Sig  . albuterol (PROVENTIL HFA;VENTOLIN HFA) 108 (90 BASE) MCG/ACT inhaler Inhale 1-2 puffs into the lungs every 6 (six) hours as needed for wheezing or shortness of breath. Reported on 10/03/2015  . aspirin EC 81 MG tablet Take 81 mg by mouth daily.  . Glucosamine-Chondroit-Vit C-Mn (GLUCOSAMINE 1500 COMPLEX) CAPS Take 1 capsule by mouth daily.  . irbesartan (AVAPRO) 150 MG tablet Take 1 tablet (150 mg total) by mouth daily.  . Tamsulosin HCl (FLOMAX) 0.4 MG CAPS Take 0.4 mg by mouth 2 (two) times daily.   [name] amLODipine (NORVASC) 5 MG tablet Take 5 mg by mouth at bedtime.   . budesonide-formoterol (SYMBICORT) 160-4.5 MCG/ACT inhaler Inhale 2 puffs into the lungs 2 (two) times daily.  . [DISCONTINUED] budesonide-formoterol (SYMBICORT) 160-4.5 MCG/ACT inhaler Inhale 2 puffs into the lungs 2 (two) times daily.   No facility-administered encounter medications on file as of 01/09/2016.

## 2016-01-09 NOTE — Patient Instructions (Signed)
Plan A = Automatic = Symbicort 160 Take 2 puffs first thing in am and then another 2 puffs about 12 hours later.      Plan B = Backup Only use your albuterol as a rescue medication to be used if you can't catch your breath by resting or doing a relaxed purse lip breathing pattern.  - The less you use it, the better it will work when you need it. - Ok to use the inhaler up to 2 puffs  every 4 hours if you must but call for appointment if use goes up over your usual need - Don't leave home without it !!  (think of it like the spare tire for your car)     The key is to stop smoking completely before smoking completely stops you!    If you are satisfied with your treatment plan,  let your doctor know and he/she can either refill your medications or you can return here when your prescription runs out.     If in any way you are not 100% satisfied,  please tell us.  If 100% better, tell your friends!  Pulmonary follow up is as needed

## 2016-01-12 NOTE — Assessment & Plan Note (Signed)
Trial off acei 12/12/2015 >> improved symptoms as of 01/09/2016 > stay off acei indefinitely   Although even in retrospect it may not be clear the ACEi contributed to the pt's symptoms,  Pt improved off them and adding them back at this point or in the future would risk confusion in interpretation of non-specific respiratory symptoms to which this patient is prone  ie  Better not to muddy the waters here.

## 2016-01-12 NOTE — Assessment & Plan Note (Signed)
-   PFTs  05/27/12   FEV1  2.04(62%) ratio 54  - 12/12/2015 try off spiriva since pt says not working > increased need for saba - Spirometry 01/09/2016  FEV1 1.19 (47%)  Ratio 50 p nothing priro  - 01/09/2016  After extensive coaching HFA effectiveness =    75% try symbicort 160 2bid     I had an extended final summary discussion with the patient reviewing all relevant studies completed to date and  lasting 15 to 20 minutes of a 25 minute visit on the following issues:  Clearly has enough variability and use for saba to indicate an AB component so rec trial of symbicort 160 2bid  Also advised against all tobacco product exposure  Each maintenance medication was reviewed in detail including most importantly the difference between maintenance and as needed and under what circumstances the prns are to be used.  Please see instructions for details which were reviewed in writing and the patient given a copy.    Pulmonary f/u is prn

## 2016-01-22 ENCOUNTER — Encounter: Payer: Self-pay | Admitting: Family

## 2016-02-21 ENCOUNTER — Other Ambulatory Visit: Payer: Self-pay

## 2016-02-21 DIAGNOSIS — I77811 Abdominal aortic ectasia: Secondary | ICD-10-CM

## 2016-02-28 ENCOUNTER — Ambulatory Visit: Payer: Self-pay | Admitting: Family

## 2016-02-28 ENCOUNTER — Inpatient Hospital Stay (HOSPITAL_COMMUNITY)
Admission: RE | Admit: 2016-02-28 | Discharge: 2016-02-28 | Disposition: A | Discharge status: Home / Self Care | Payer: Self-pay | Source: Ambulatory Visit | Attending: Family | Admitting: Family

## 2016-02-28 DIAGNOSIS — I714 Abdominal aortic aneurysm, without rupture, unspecified: Secondary | ICD-10-CM

## 2016-03-07 DIAGNOSIS — Z8551 Personal history of malignant neoplasm of bladder: Secondary | ICD-10-CM | POA: Diagnosis not present

## 2016-04-21 ENCOUNTER — Encounter: Payer: Self-pay | Admitting: Vascular Surgery

## 2016-04-22 NOTE — Progress Notes (Signed)
Established Previous Bypass   History of Present Illness  Derrick Moon is a 76 y.o. (1940/04/19) male who presents with chief complaint: none.  Previous operation(s) include:   1. R CFA to AK pop BPG w/ ips NR GSV (09/19/15) completed for ulcers in R foot  The patient's symptoms have not progressed.  The patient's symptoms are: none. The patient's treatment regimen currently included: maximal medical management.  This patient is completely asx in his L leg.   The patient's PMH, PSH, SH, and FamHx are unchanged from 11/09/15.  Current Outpatient Prescriptions  Medication Sig Dispense Refill  . albuterol (PROVENTIL HFA;VENTOLIN HFA) 108 (90 BASE) MCG/ACT inhaler Inhale 1-2 puffs into the lungs every 6 (six) hours as needed for wheezing or shortness of breath. Reported on 10/03/2015    . amLODipine (NORVASC) 5 MG tablet Take 5 mg by mouth at bedtime.     [name] aspirin EC 81 MG tablet Take 81 mg by mouth daily.    . budesonide-formoterol (SYMBICORT) 160-4.5 MCG/ACT inhaler Inhale 2 puffs into the lungs 2 (two) times daily. 1 Inhaler 11  . Glucosamine-Chondroit-Vit C-Mn (GLUCOSAMINE 1500 COMPLEX) CAPS Take 1 capsule by mouth daily.    . irbesartan (AVAPRO) 150 MG tablet Take 1 tablet (150 mg total) by mouth daily. 30 tablet 11  . Tamsulosin HCl (FLOMAX) 0.4 MG CAPS Take 0.4 mg by mouth 2 (two) times daily.      No current facility-administered medications for this visit.     Allergies  Allergen Reactions  . Aspartame And Phenylalanine Nausea Only  . Sulfonamide Derivatives Nausea And Vomiting  . Other Rash    Pt reports rash on back, buttocks, shoulders and stomach after arteriogram    On ROS today: no rest pain or intermittent claudication , no wounds   Physical Examination  Vitals:   04/25/16 1014  BP: (!) 149/86  Pulse: (!) 57  Resp: 18  Temp: (!) 96.9 F (36.1 C)  TempSrc: Oral  SpO2: 97%  Weight: 194 lb (88 kg)  Height: 5\' 11"  (1.803 m)    Body mass index is  27.06 kg/m.  General: Alert, O x 3, WD,NAD  Pulmonary: Sym exp, good B air movt,CTA B  Cardiac: RRR, Nl S1, S2, no Murmurs, No rubs, No S3,S4  Vascular: Vessel Right Left  Radial Palpable Palpable  Brachial Palpable Palpable  Carotid Palpable, No Bruit Palpable, No Bruit  Aorta Not palpable N/A  Femoral Palpable Palpable  Popliteal Not palpable Not palpable  PT Faintly palpable Not palpable  DP Faintly palpable Not palpable   Gastrointestinal: soft, non-distended, non-tender to palpation, No guarding or rebound, no HSM, no masses, no CVAT B, No palpable prominent aortic pulse,    Musculoskeletal: M/S 5/5 throughout  , Extremities without ischemic changes  , No edema present,   Neurologic: CN 2-12 intact , Pain and light touch intact in extremities, Motor exam as listed above   Non-Invasive Vascular Imaging ABI (Date: 04/22/2016)  R:   ABI: 0.98,   DP: tri  PT: tri  TBI:  0.59  L:   ABI: 0.54,   DP: mono  PT: none  TBI: 0.37  Bypass Duplex (Date: 04/22/2016)  Proximal to bypass: 155 c/s  With-in bypass: 94-142 c/s  Distal to bypass: 129 c/s  AAA Duplex (04/25/2016)  3.4 cm x 3.9 cm  Previously 2.7 cm x 2.8 cm (02/23/14)   Medical Decision Making  Derrick Moon is a 76 y.o. male who presents  with: s/p R CFA to AK pop BPG w/ ips NR GSV done for CLI, SOB, small asx AAA   Based on the patient's vascular studies and examination, I have offered the patient: BLE ABI and bypass duplex in 6 month, AAA duplex can be repeat in one year.  I discussed in depth with the patient the nature of atherosclerosis, and emphasized the importance of maximal medical management including strict control of blood pressure, blood glucose, and lipid levels, obtaining regular exercise, and cessation of smoking.    The patient is aware that without maximal medical management the underlying atherosclerotic disease process will progress, limiting the benefit of any  interventions.  I discussed in depth with the patient the need for long term surveillance to improve the primary assisted patency of his bypass.  The patient agrees to cooperate with such. The patient is currently on a statin:  Zocor. The patient is currently not on an anti-platelet.  Patient will be started on ASA 81 mg PO daily  Thank you for allowing [place] to participate in this patient's care.   Adele Barthel, MD, FACS [facility] Office: [phone] Pager: [phone]

## 2016-04-25 ENCOUNTER — Encounter: Payer: Self-pay | Admitting: Vascular Surgery

## 2016-04-25 ENCOUNTER — Ambulatory Visit (INDEPENDENT_AMBULATORY_CARE_PROVIDER_SITE_OTHER): Payer: PPO | Admitting: Vascular Surgery

## 2016-04-25 ENCOUNTER — Ambulatory Visit (INDEPENDENT_AMBULATORY_CARE_PROVIDER_SITE_OTHER)
Admission: RE | Admit: 2016-04-25 | Discharge: 2016-04-25 | Disposition: A | Discharge status: Home / Self Care | Payer: PPO | Source: Ambulatory Visit | Attending: Vascular Surgery | Admitting: Vascular Surgery

## 2016-04-25 ENCOUNTER — Ambulatory Visit (HOSPITAL_COMMUNITY)
Admission: RE | Admit: 2016-04-25 | Discharge: 2016-04-25 | Disposition: A | Discharge status: Home / Self Care | Payer: PPO | Source: Ambulatory Visit | Attending: Vascular Surgery | Admitting: Vascular Surgery

## 2016-04-25 VITALS — BP 149/86 | HR 57 | Temp 96.9°F | Resp 18 | Ht 71.0 in | Wt 194.0 lb

## 2016-04-25 DIAGNOSIS — I70235 Atherosclerosis of native arteries of right leg with ulceration of other part of foot: Secondary | ICD-10-CM | POA: Diagnosis not present

## 2016-04-25 DIAGNOSIS — I739 Peripheral vascular disease, unspecified: Secondary | ICD-10-CM | POA: Diagnosis not present

## 2016-04-25 DIAGNOSIS — Z95820 Peripheral vascular angioplasty status with implants and grafts: Secondary | ICD-10-CM | POA: Diagnosis not present

## 2016-04-25 DIAGNOSIS — I714 Abdominal aortic aneurysm, without rupture, unspecified: Secondary | ICD-10-CM

## 2016-04-25 DIAGNOSIS — R9439 Abnormal result of other cardiovascular function study: Secondary | ICD-10-CM | POA: Insufficient documentation

## 2016-04-25 DIAGNOSIS — I77811 Abdominal aortic ectasia: Secondary | ICD-10-CM

## 2016-04-30 NOTE — Addendum Note (Signed)
Addended by: Takara Sermons A on: 04/30/2016 10:31 AM   Modules accepted: Orders  

## 2016-06-18 DIAGNOSIS — I10 Essential (primary) hypertension: Secondary | ICD-10-CM | POA: Diagnosis not present

## 2016-06-18 DIAGNOSIS — Z6828 Body mass index (BMI) 28.0-28.9, adult: Secondary | ICD-10-CM | POA: Diagnosis not present

## 2016-06-18 DIAGNOSIS — J449 Chronic obstructive pulmonary disease, unspecified: Secondary | ICD-10-CM | POA: Diagnosis not present

## 2016-07-01 DIAGNOSIS — M791 Myalgia: Secondary | ICD-10-CM | POA: Diagnosis not present

## 2016-07-08 DIAGNOSIS — I739 Peripheral vascular disease, unspecified: Secondary | ICD-10-CM | POA: Diagnosis not present

## 2016-07-08 DIAGNOSIS — L851 Acquired keratosis [keratoderma] palmaris et plantaris: Secondary | ICD-10-CM | POA: Diagnosis not present

## 2016-07-08 DIAGNOSIS — B351 Tinea unguium: Secondary | ICD-10-CM | POA: Diagnosis not present

## 2016-08-15 ENCOUNTER — Emergency Department (HOSPITAL_COMMUNITY): Payer: PPO

## 2016-08-15 ENCOUNTER — Emergency Department (HOSPITAL_COMMUNITY)
Admission: EM | Admit: 2016-08-15 | Discharge: 2016-08-15 | Disposition: A | Discharge status: Home / Self Care | Payer: PPO | Attending: Emergency Medicine | Admitting: Emergency Medicine

## 2016-08-15 ENCOUNTER — Encounter (HOSPITAL_COMMUNITY): Payer: Self-pay | Admitting: Emergency Medicine

## 2016-08-15 DIAGNOSIS — Y9389 Activity, other specified: Secondary | ICD-10-CM | POA: Diagnosis not present

## 2016-08-15 DIAGNOSIS — J449 Chronic obstructive pulmonary disease, unspecified: Secondary | ICD-10-CM | POA: Diagnosis not present

## 2016-08-15 DIAGNOSIS — M25561 Pain in right knee: Secondary | ICD-10-CM | POA: Diagnosis not present

## 2016-08-15 DIAGNOSIS — Z7982 Long term (current) use of aspirin: Secondary | ICD-10-CM | POA: Diagnosis not present

## 2016-08-15 DIAGNOSIS — Z79899 Other long term (current) drug therapy: Secondary | ICD-10-CM | POA: Insufficient documentation

## 2016-08-15 DIAGNOSIS — Z8551 Personal history of malignant neoplasm of bladder: Secondary | ICD-10-CM | POA: Diagnosis not present

## 2016-08-15 DIAGNOSIS — S80211A Abrasion, right knee, initial encounter: Secondary | ICD-10-CM | POA: Diagnosis not present

## 2016-08-15 DIAGNOSIS — Y999 Unspecified external cause status: Secondary | ICD-10-CM | POA: Insufficient documentation

## 2016-08-15 DIAGNOSIS — S8992XA Unspecified injury of left lower leg, initial encounter: Secondary | ICD-10-CM | POA: Diagnosis not present

## 2016-08-15 DIAGNOSIS — S79922A Unspecified injury of left thigh, initial encounter: Secondary | ICD-10-CM | POA: Diagnosis not present

## 2016-08-15 DIAGNOSIS — Y92410 Unspecified street and highway as the place of occurrence of the external cause: Secondary | ICD-10-CM | POA: Diagnosis not present

## 2016-08-15 DIAGNOSIS — S76112A Strain of left quadriceps muscle, fascia and tendon, initial encounter: Secondary | ICD-10-CM | POA: Diagnosis not present

## 2016-08-15 DIAGNOSIS — S8991XA Unspecified injury of right lower leg, initial encounter: Secondary | ICD-10-CM | POA: Diagnosis not present

## 2016-08-15 DIAGNOSIS — I10 Essential (primary) hypertension: Secondary | ICD-10-CM | POA: Diagnosis not present

## 2016-08-15 DIAGNOSIS — Z23 Encounter for immunization: Secondary | ICD-10-CM | POA: Diagnosis not present

## 2016-08-15 DIAGNOSIS — F17290 Nicotine dependence, other tobacco product, uncomplicated: Secondary | ICD-10-CM | POA: Insufficient documentation

## 2016-08-15 DIAGNOSIS — M25562 Pain in left knee: Secondary | ICD-10-CM | POA: Diagnosis not present

## 2016-08-15 LAB — BASIC METABOLIC PANEL
ANION GAP: 8 (ref 5–15)
BUN: 14 mg/dL (ref 6–20)
CALCIUM: 9.7 mg/dL (ref 8.9–10.3)
CO2: 26 mmol/L (ref 22–32)
Chloride: 105 mmol/L (ref 101–111)
Creatinine, Ser: 0.99 mg/dL (ref 0.61–1.24)
GFR calc Af Amer: >60 mL/min (ref >60)
GFR calc non Af Amer: >60 mL/min (ref >60)
GLUCOSE: 116 mg/dL — AB (ref 65–99)
Potassium: 3.9 mmol/L (ref 3.5–5.1)
Sodium: 139 mmol/L (ref 135–145)

## 2016-08-15 LAB — CBC WITH DIFFERENTIAL/PLATELET
BASOS PCT: 0 %
Basophils Absolute: 0 10*3/uL (ref 0.0–0.1)
Eosinophils Absolute: 0.1 10*3/uL (ref 0.0–0.7)
Eosinophils Relative: 1 %
HEMATOCRIT: 49.7 % (ref 39.0–52.0)
Hemoglobin: 16.5 g/dL (ref 13.0–17.0)
LYMPHS PCT: 12 %
Lymphs Abs: 1.7 10*3/uL (ref 0.7–4.0)
MCH: 30.2 pg (ref 26.0–34.0)
MCHC: 33.2 g/dL (ref 30.0–36.0)
MCV: 90.9 fL (ref 78.0–100.0)
MONO ABS: 0.8 10*3/uL (ref 0.1–1.0)
MONOS PCT: 6 %
NEUTROS ABS: 11.3 10*3/uL — AB (ref 1.7–7.7)
Neutrophils Relative %: 81 %
Platelets: 252 10*3/uL (ref 150–400)
RBC: 5.47 MIL/uL (ref 4.22–5.81)
RDW: 14.9 % (ref 11.5–15.5)
WBC: 13.9 10*3/uL — ABNORMAL HIGH (ref 4.0–10.5)

## 2016-08-15 LAB — TROPONIN I: Troponin I: <0.03 ng/mL (ref <0.03)

## 2016-08-15 MED ORDER — MORPHINE SULFATE (PF) 2 MG/ML IV SOLN
2.0000 mg | INTRAVENOUS | Status: DC | PRN
  Administered 2016-08-15: 2 mg via INTRAVENOUS
  Filled 2016-08-15: qty 1

## 2016-08-15 MED ORDER — HYDROCODONE-ACETAMINOPHEN 5-325 MG PO TABS: ORAL_TABLET | ORAL | 0 refills | Status: DC

## 2016-08-15 MED ORDER — TETANUS-DIPHTH-ACELL PERTUSSIS 5-2.5-18.5 LF-MCG/0.5 IM SUSP
0.5000 mL | Freq: Once | INTRAMUSCULAR | Status: AC
  Administered 2016-08-15: 0.5 mL via INTRAMUSCULAR
  Filled 2016-08-15: qty 0.5

## 2016-08-15 NOTE — Discharge Instructions (Signed)
Take the prescription as directed. Increase your aspirin: 81 mg by mouth twice a day.  Apply moist heat or ice to the area(s) of discomfort, for 15 minutes at a time, several times per day for the next few days.  Do not fall asleep on a heating or ice pack.  Wear the TED hose, knee immobilzer and use the crutches until you are seen in follow up. Pump your feet up and down frequently daily until you are seen in follow up (you are trying to prevent "a blood clot" in your legs by moving them frequently).  Go to your Orthopedic doctor's office on Tuesday morning at 8am to be seen in follow up. Return to the Emergency Department immediately if worsening.
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
## 2016-08-15 NOTE — ED Notes (Signed)
Waiting for patient's wife to get back to discharge pt.

## 2016-08-19 DIAGNOSIS — S76112A Strain of left quadriceps muscle, fascia and tendon, initial encounter: Secondary | ICD-10-CM | POA: Diagnosis not present

## 2016-08-20 NOTE — H&P (Signed)
Derrick Moon is an 76 y.o. male.    Chief Complaint: left knee pain  HPI: Pt is a 76 y.o. male complaining of left knee pain since injury several days ago. Pain had continually increased since the beginning. Exam shows complete quad tendon rupture of left leg with extensor lag. Pain with any movement of the left lower extremity Various options are discussed with the patient. Risks, benefits and expectations were discussed with the patient. Patient understand the risks, benefits and expectations and wishes to proceed with surgery.   PCP:  [name], MD  D/C Plans: Home  PMH: Past Medical History:  Diagnosis Date  . AAA (abdominal aortic aneurysm) ([place])   . AAA (abdominal aortic aneurysm) ([place])   . Anxiety    due to pain in leg  . Arthritis    Osteo  . Bell's palsy   . Cancer [facility])    Bladder cancer  . Cataracts, bilateral   . COPD (chronic obstructive pulmonary disease) ([facility])    Albuterol inhaler as needed  . Diverticulitis   . Enlarged prostate    takes Flomax daily  . GERD (gastroesophageal reflux disease)    was on Prilosec but has been off for yrs-no issues now with reflux  . Headache    migraines years ago  . History of bronchitis    > 59yrs ago  . History of colon polyps    benign  . History of dizziness    several yrs ago-was on Meclizine but has been off for several yrs-no problems since  . Hypertension    takes Amlodipine and Lisinpril daily  . Joint pain   . Joint swelling   . Nocturia   . Pneumonia    several times with most recent within 5 yrs ago  . Restless leg syndrome   . Shortness of breath dyspnea    with exertion  . Sleep apnea    does not use cpap  . Urinary urgency     PSH: Past Surgical History:  Procedure Laterality Date  . BICEPS TENDON REPAIR     LEFT  . CARPAL TUNNEL RELEASE Right 2014  . CHOLECYSTECTOMY  July 16,2010  . COLONOSCOPY    . CYSTOSCOPY    . ESOPHAGOGASTRODUODENOSCOPY    . FEMORAL-POPLITEAL BYPASS GRAFT Right  09/19/2015   Procedure: RIGHT COMMON FEMORAL-ABOVE KNEE POPLITEAL ARTERY    BYPASS GRAFT ;  Surgeon: Conrad [place], MD;  Location: [place];  Service: Vascular;  Laterality: Right;  . FOOT SURGERY Right    x 5t  . HAND SURGERY Right   . HERNIA REPAIR Bilateral    inguinal  . PERIPHERAL VASCULAR CATHETERIZATION N/A 08/02/2015   Procedure: Abdominal Aortogram w/Lower Extremity;  Surgeon: Conrad [place], MD;  Location: [place] CV LAB;  Service: Cardiovascular;  Laterality: N/A;  . REVERSE SHOULDER ARTHROPLASTY Right 07/07/2014   Procedure: RIGHT REVERSE TOTAL SHOULDER ;  Surgeon: Netta Cedars, MD;  Location: [place];  Service: Orthopedics;  Laterality: Right;  . REVERSE TOTAL SHOULDER ARTHROPLASTY Right 07/07/2014   Dr Veverly Fells  . ROTATOR CUFF REPAIR     Bilateral   . tumor removed from bladder    . VEIN HARVEST Right 09/19/2015   Procedure: WITH NON REVERSE RIGHT GREATER SAPHENOUS VEIN HARVEST;  Surgeon: Conrad Keys, MD;  Location: [place];  Service: Vascular;  Laterality: Right;    Social History:  reports that he has been smoking Cigars.  He has smoked for the past 45.00 years.  He has never used smokeless tobacco. He reports that he drinks alcohol. He reports that he uses drugs, including Marijuana.  Allergies:  Allergies  Allergen Reactions  . Aspartame And Phenylalanine Nausea Only  . Sulfonamide Derivatives Nausea And Vomiting  . Other Rash    Pt reports rash on back, buttocks, shoulders and stomach after arteriogram    Medications: No current facility-administered medications for this encounter.    Current Outpatient Prescriptions  Medication Sig Dispense Refill  . albuterol (PROVENTIL HFA;VENTOLIN HFA) 108 (90 BASE) MCG/ACT inhaler Inhale 1-2 puffs into the lungs every 6 (six) hours as needed for wheezing or shortness of breath. Reported on 10/03/2015    . aspirin EC 81 MG tablet Take 81 mg by mouth daily.    . budesonide-formoterol (SYMBICORT) 160-4.5 MCG/ACT inhaler Inhale 2 puffs  into the lungs 2 (two) times daily. 1 Inhaler 11  . HYDROcodone-acetaminophen (NORCO/VICODIN) 5-325 MG tablet 1 or 2 tabs PO q8 hours prn pain 12 tablet 0  . irbesartan (AVAPRO) 150 MG tablet Take 1 tablet (150 mg total) by mouth daily. 30 tablet 11  . Tamsulosin HCl (FLOMAX) 0.4 MG CAPS Take 0.4 mg by mouth 2 (two) times daily.       No results found for this or any previous visit (from the past 48 hour(s)). No results found.  ROS: Pain with rom of the left lower extremity  Physical Exam:  Alert and oriented 76 y.o. male in no acute distress Cranial nerves 2-12 intact Cervical spine: full rom with no tenderness, nv intact distally Chest: active breath sounds bilaterally, no wheeze rhonchi or rales Heart: regular rate and rhythm, no murmur Abd: non tender non distended with active bowel sounds Hip is stable with rom  Left knee with moderate effusion Palpable defect of quad tendon anteriorly Nv intact distally No rashes or edema Antalgic gait  Assessment/Plan Assessment: left quad tendon rupture  Plan: Patient will undergo a left quad tendon open repair by Dr. Veverly Fells at [facility]. Risks benefits and expectations were discussed with the patient. Patient understand risks, benefits and expectations and wishes to proceed.

## 2016-08-21 ENCOUNTER — Encounter (HOSPITAL_COMMUNITY): Payer: Self-pay | Admitting: *Deleted

## 2016-08-21 NOTE — Progress Notes (Signed)
Anesthesia Chart Review:  Pt is a same day work up  Pt is a 76 year old male scheduled for L open quadriceps tendon repair on 08/22/2016 with Clemetine Marker, M.D.  - PCP is [name], MD - Vascular surgeon is Adele Barthel, MD - Saw cardiologist Kate Sable, MD 08/13/15 for pre-op eval prior to FPBG in 2017.  Does not usually see cardiology  PMH includes: AAA (3.4cm x 3.9 cm 04/25/16), PVD (s/p R FPBG 09/19/15), HTN, COPD, OSA, bladder cancer, GERD. Current smoker. BMI 27.  - ED visit 08/15/16 for knee injury  Medications include: Albuterol, ASA 81 mg, Symbicort, irbesartan  Labs from ED 08/15/16 reviewed. BMP and CBC acceptable for surgery.  CXR 12/12/15: COPD changes without infiltrate.  EKG 08/15/16: NSR. Septal infarct, age undetermined. Baseline wander  Nuclear stress test 08/15/15:   No diagnostic ST segment changes to indicate ischemia.  Small, mild intensity, partially reversible mid apical inferoseptal defect. Possibly associated with variable soft tissue attenuation, however mild region of ischemia is not excluded.  This is a low risk study.  Nuclear stress EF: 66%.  If no changes, I anticipate pt can proceed with surgery as scheduled.   Willeen Cass, FNP-BC [facility]/Anesthesiology Phone: [phone] 08/21/2016 2:32 PM

## 2016-08-21 NOTE — Progress Notes (Signed)
Pt denies SOB, chest pain, and being under the care of a cardiologist. Pt denies having a cardiac cath. Pt made aware to stop taking Aspirin, vitamins, fish oil and herbal medications. Do not take any NSAIDs ie: Ibuprofen, Advil, Naproxen, BC and Goody Powder or any medication containing Aspirin. Pt verbalized understanding of all pre-op instructions. Anesthesia reviewed pt history ( see note).

## 2016-08-22 ENCOUNTER — Ambulatory Visit (HOSPITAL_COMMUNITY)
Admission: RE | Admit: 2016-08-22 | Discharge: 2016-08-22 | Disposition: A | Discharge status: Home / Self Care | Payer: PPO | Source: Ambulatory Visit | Attending: Orthopedic Surgery | Admitting: Orthopedic Surgery

## 2016-08-22 ENCOUNTER — Encounter (HOSPITAL_COMMUNITY)
Admission: RE | Disposition: A | Discharge status: Home / Self Care | Payer: Self-pay | Source: Ambulatory Visit | Attending: Orthopedic Surgery

## 2016-08-22 ENCOUNTER — Ambulatory Visit (HOSPITAL_COMMUNITY): Payer: PPO | Admitting: Emergency Medicine

## 2016-08-22 ENCOUNTER — Encounter (HOSPITAL_COMMUNITY): Payer: Self-pay | Admitting: *Deleted

## 2016-08-22 DIAGNOSIS — F17290 Nicotine dependence, other tobacco product, uncomplicated: Secondary | ICD-10-CM | POA: Insufficient documentation

## 2016-08-22 DIAGNOSIS — I10 Essential (primary) hypertension: Secondary | ICD-10-CM | POA: Insufficient documentation

## 2016-08-22 DIAGNOSIS — N400 Enlarged prostate without lower urinary tract symptoms: Secondary | ICD-10-CM | POA: Insufficient documentation

## 2016-08-22 DIAGNOSIS — Z79899 Other long term (current) drug therapy: Secondary | ICD-10-CM | POA: Insufficient documentation

## 2016-08-22 DIAGNOSIS — I714 Abdominal aortic aneurysm, without rupture: Secondary | ICD-10-CM | POA: Diagnosis not present

## 2016-08-22 DIAGNOSIS — S76112A Strain of left quadriceps muscle, fascia and tendon, initial encounter: Secondary | ICD-10-CM | POA: Diagnosis not present

## 2016-08-22 DIAGNOSIS — Z95820 Peripheral vascular angioplasty status with implants and grafts: Secondary | ICD-10-CM | POA: Insufficient documentation

## 2016-08-22 DIAGNOSIS — I739 Peripheral vascular disease, unspecified: Secondary | ICD-10-CM | POA: Insufficient documentation

## 2016-08-22 DIAGNOSIS — J449 Chronic obstructive pulmonary disease, unspecified: Secondary | ICD-10-CM | POA: Diagnosis not present

## 2016-08-22 DIAGNOSIS — M25562 Pain in left knee: Secondary | ICD-10-CM | POA: Diagnosis not present

## 2016-08-22 DIAGNOSIS — G8918 Other acute postprocedural pain: Secondary | ICD-10-CM | POA: Diagnosis not present

## 2016-08-22 DIAGNOSIS — Z96611 Presence of right artificial shoulder joint: Secondary | ICD-10-CM | POA: Diagnosis not present

## 2016-08-22 DIAGNOSIS — G4730 Sleep apnea, unspecified: Secondary | ICD-10-CM | POA: Insufficient documentation

## 2016-08-22 DIAGNOSIS — Z7982 Long term (current) use of aspirin: Secondary | ICD-10-CM | POA: Diagnosis not present

## 2016-08-22 DIAGNOSIS — W052XXA Fall from non-moving motorized mobility scooter, initial encounter: Secondary | ICD-10-CM | POA: Diagnosis not present

## 2016-08-22 HISTORY — DX: Strain of left quadriceps muscle, fascia and tendon, initial encounter: S76.112A

## 2016-08-22 HISTORY — PX: QUADRICEPS TENDON REPAIR: SHX756

## 2016-08-22 HISTORY — DX: Personal history of other diseases of the digestive system: Z87.19

## 2016-08-22 SURGERY — REPAIR, TENDON, QUADRICEPS
Anesthesia: Spinal | Site: Leg Upper | Laterality: Left

## 2016-08-22 MED ORDER — FENTANYL CITRATE (PF) 250 MCG/5ML IJ SOLN
INTRAMUSCULAR | Status: AC
  Filled 2016-08-22: qty 5

## 2016-08-22 MED ORDER — CEFAZOLIN SODIUM-DEXTROSE 2-4 GM/100ML-% IV SOLN
2.0000 g | INTRAVENOUS | Status: AC
  Administered 2016-08-22: 2 g via INTRAVENOUS

## 2016-08-22 MED ORDER — ROPIVACAINE HCL 5 MG/ML IJ SOLN
INTRAMUSCULAR | Status: DC | PRN
  Administered 2016-08-22: 25 mL via PERINEURAL

## 2016-08-22 MED ORDER — ASPIRIN EC 81 MG PO TBEC: 81.0000 mg | DELAYED_RELEASE_TABLET | Freq: Two times a day (BID) | ORAL | 0 refills | Status: DC

## 2016-08-22 MED ORDER — PROPOFOL 500 MG/50ML IV EMUL
INTRAVENOUS | Status: DC | PRN
  Administered 2016-08-22: 100 ug/kg/min via INTRAVENOUS

## 2016-08-22 MED ORDER — CHLORHEXIDINE GLUCONATE 4 % EX LIQD: 60.0000 mL | Freq: Once | CUTANEOUS | Status: DC

## 2016-08-22 MED ORDER — KETOROLAC TROMETHAMINE 30 MG/ML IJ SOLN
30.0000 mg | Freq: Once | INTRAMUSCULAR | Status: AC
  Administered 2016-08-22: 30 mg via INTRAVENOUS

## 2016-08-22 MED ORDER — PROMETHAZINE HCL 25 MG/ML IJ SOLN: 6.2500 mg | INTRAMUSCULAR | Status: DC | PRN

## 2016-08-22 MED ORDER — 0.9 % SODIUM CHLORIDE (POUR BTL) OPTIME
TOPICAL | Status: DC | PRN
  Administered 2016-08-22: 1000 mL

## 2016-08-22 MED ORDER — FENTANYL CITRATE (PF) 100 MCG/2ML IJ SOLN
INTRAMUSCULAR | Status: DC | PRN
  Administered 2016-08-22: 50 ug via INTRAVENOUS

## 2016-08-22 MED ORDER — PROPOFOL 10 MG/ML IV BOLUS
INTRAVENOUS | Status: DC | PRN
  Administered 2016-08-22: 20 mg via INTRAVENOUS

## 2016-08-22 MED ORDER — MIDAZOLAM HCL 2 MG/2ML IJ SOLN
INTRAMUSCULAR | Status: AC
  Administered 2016-08-22: 1 mg via INTRAVENOUS
  Filled 2016-08-22: qty 2

## 2016-08-22 MED ORDER — BUPIVACAINE IN DEXTROSE 0.75-8.25 % IT SOLN
INTRATHECAL | Status: DC | PRN
  Administered 2016-08-22: 1.7 mL via INTRATHECAL

## 2016-08-22 MED ORDER — METHOCARBAMOL 500 MG PO TABS: 500.0000 mg | ORAL_TABLET | Freq: Three times a day (TID) | ORAL | 1 refills | Status: DC | PRN

## 2016-08-22 MED ORDER — KETOROLAC TROMETHAMINE 30 MG/ML IJ SOLN
INTRAMUSCULAR | Status: AC
  Filled 2016-08-22: qty 1

## 2016-08-22 MED ORDER — LIDOCAINE 2% (20 MG/ML) 5 ML SYRINGE
INTRAMUSCULAR | Status: AC
  Filled 2016-08-22: qty 5

## 2016-08-22 MED ORDER — OXYCODONE-ACETAMINOPHEN 5-325 MG PO TABS: 1.0000 | ORAL_TABLET | ORAL | 0 refills | Status: DC | PRN

## 2016-08-22 MED ORDER — LACTATED RINGERS IV SOLN
INTRAVENOUS | Status: DC
  Administered 2016-08-22: 11:00:00 via INTRAVENOUS

## 2016-08-22 MED ORDER — PROPOFOL 10 MG/ML IV BOLUS
INTRAVENOUS | Status: AC
  Filled 2016-08-22: qty 20

## 2016-08-22 MED ORDER — PHENYLEPHRINE HCL 10 MG/ML IJ SOLN
INTRAMUSCULAR | Status: DC | PRN
  Administered 2016-08-22: 25 ug/min via INTRAVENOUS

## 2016-08-22 MED ORDER — FENTANYL CITRATE (PF) 100 MCG/2ML IJ SOLN
50.0000 ug | Freq: Once | INTRAMUSCULAR | Status: AC
  Administered 2016-08-22: 50 ug via INTRAVENOUS

## 2016-08-22 MED ORDER — HYDROMORPHONE HCL 1 MG/ML IJ SOLN: 0.2500 mg | INTRAMUSCULAR | Status: DC | PRN

## 2016-08-22 MED ORDER — FENTANYL CITRATE (PF) 100 MCG/2ML IJ SOLN
INTRAMUSCULAR | Status: AC
  Administered 2016-08-22: 50 ug via INTRAVENOUS
  Filled 2016-08-22: qty 2

## 2016-08-22 MED ORDER — BUPIVACAINE-EPINEPHRINE (PF) 0.25% -1:200000 IJ SOLN
INTRAMUSCULAR | Status: AC
  Filled 2016-08-22: qty 30

## 2016-08-22 MED ORDER — MIDAZOLAM HCL 2 MG/2ML IJ SOLN
1.0000 mg | Freq: Once | INTRAMUSCULAR | Status: AC
  Administered 2016-08-22: 1 mg via INTRAVENOUS

## 2016-08-22 MED ORDER — CEFAZOLIN SODIUM-DEXTROSE 2-4 GM/100ML-% IV SOLN
INTRAVENOUS | Status: AC
  Filled 2016-08-22: qty 100

## 2016-08-22 MED ORDER — ONDANSETRON HCL 4 MG/2ML IJ SOLN
INTRAMUSCULAR | Status: AC
  Filled 2016-08-22: qty 2

## 2016-08-22 MED ORDER — ONDANSETRON HCL 4 MG/2ML IJ SOLN
INTRAMUSCULAR | Status: DC | PRN
  Administered 2016-08-22: 4 mg via INTRAVENOUS

## 2016-08-22 SURGICAL SUPPLY — 82 items
BANDAGE ACE 4X5 VEL STRL LF (GAUZE/BANDAGES/DRESSINGS) ×1 IMPLANT
BANDAGE ACE 6X5 VEL STRL LF (GAUZE/BANDAGES/DRESSINGS) ×1 IMPLANT
BANDAGE ESMARK 6X9 LF (GAUZE/BANDAGES/DRESSINGS) ×1 IMPLANT
BLADE CLIPPER SURG (BLADE) IMPLANT
BLADE SURG 10 STRL SS (BLADE) ×3 IMPLANT
BLADE SURG 15 STRL LF DISP TIS (BLADE) ×1 IMPLANT
BLADE SURG 15 STRL SS (BLADE) ×3
BNDG CMPR 9X6 STRL LF SNTH (GAUZE/BANDAGES/DRESSINGS) ×1
BNDG CMPR MED 15X6 ELC VLCR LF (GAUZE/BANDAGES/DRESSINGS) ×1
BNDG COHESIVE 6X5 TAN STRL LF (GAUZE/BANDAGES/DRESSINGS) ×3 IMPLANT
BNDG ELASTIC 6X15 VLCR STRL LF (GAUZE/BANDAGES/DRESSINGS) ×2 IMPLANT
BNDG ESMARK 6X9 LF (GAUZE/BANDAGES/DRESSINGS) ×3
BNDG GAUZE ELAST 4 BULKY (GAUZE/BANDAGES/DRESSINGS) ×5 IMPLANT
CLEANER TIP ELECTROSURG 2X2 (MISCELLANEOUS) ×3 IMPLANT
CLOSURE WOUND 1/2 X4 (GAUZE/BANDAGES/DRESSINGS) ×1
COVER MAYO STAND STRL (DRAPES) ×3 IMPLANT
COVER SURGICAL LIGHT HANDLE (MISCELLANEOUS) ×3 IMPLANT
CUFF TOURNIQUET SINGLE 34IN LL (TOURNIQUET CUFF) ×2 IMPLANT
CUFF TOURNIQUET SINGLE 44IN (TOURNIQUET CUFF) IMPLANT
DRAPE INCISE IOBAN 66X45 STRL (DRAPES) ×2 IMPLANT
DRAPE U-SHAPE 47X51 STRL (DRAPES) ×3 IMPLANT
DRSG ADAPTIC 3X8 NADH LF (GAUZE/BANDAGES/DRESSINGS) ×3 IMPLANT
DRSG PAD ABDOMINAL 8X10 ST (GAUZE/BANDAGES/DRESSINGS) ×8 IMPLANT
DURAPREP 26ML APPLICATOR (WOUND CARE) ×3 IMPLANT
ELECT CAUTERY BLADE 6.4 (BLADE) ×2 IMPLANT
ELECT NDL TIP 2.8 STRL (NEEDLE) ×1 IMPLANT
ELECT NEEDLE TIP 2.8 STRL (NEEDLE) ×3 IMPLANT
ELECT REM PT RETURN 9FT ADLT (ELECTROSURGICAL) ×3
ELECTRODE REM PT RTRN 9FT ADLT (ELECTROSURGICAL) ×1 IMPLANT
GAUZE SPONGE 4X4 12PLY STRL (GAUZE/BANDAGES/DRESSINGS) ×3 IMPLANT
GLOVE BIO SURGEON STRL SZ7.5 (GLOVE) ×3 IMPLANT
GLOVE BIOGEL PI ORTHO PRO SZ8 (GLOVE) ×2
GLOVE ORTHO TXT STRL SZ7.5 (GLOVE) ×3 IMPLANT
GLOVE PI ORTHO PRO STRL SZ8 (GLOVE) ×1 IMPLANT
GLOVE SURG ORTHO 8.5 STRL (GLOVE) ×3 IMPLANT
GOWN STRL REUS W/ TWL LRG LVL3 (GOWN DISPOSABLE) ×3 IMPLANT
GOWN STRL REUS W/ TWL XL LVL3 (GOWN DISPOSABLE) ×2 IMPLANT
GOWN STRL REUS W/TWL LRG LVL3 (GOWN DISPOSABLE) ×9
GOWN STRL REUS W/TWL XL LVL3 (GOWN DISPOSABLE) ×6
IMMOBILIZER KNEE 24 THIGH 36 (MISCELLANEOUS) IMPLANT
IMMOBILIZER KNEE 24 UNIV (MISCELLANEOUS) ×3
KIT BASIN OR (CUSTOM PROCEDURE TRAY) ×3 IMPLANT
KIT ROOM TURNOVER OR (KITS) ×3 IMPLANT
MANIFOLD NEPTUNE II (INSTRUMENTS) ×3 IMPLANT
NDL 1/2 CIR MAYO (NEEDLE) IMPLANT
NEEDLE 1/2 CIR MAYO (NEEDLE) ×3 IMPLANT
NEEDLE 22X1 1/2 (OR ONLY) (NEEDLE) IMPLANT
NS IRRIG 1000ML POUR BTL (IV SOLUTION) ×3 IMPLANT
PACK ORTHO EXTREMITY (CUSTOM PROCEDURE TRAY) ×3 IMPLANT
PAD ARMBOARD 7.5X6 YLW CONV (MISCELLANEOUS) ×4 IMPLANT
PAD CAST 4YDX4 CTTN HI CHSV (CAST SUPPLIES) ×1 IMPLANT
PADDING CAST COTTON 4X4 STRL (CAST SUPPLIES)
PADDING CAST COTTON 6X4 STRL (CAST SUPPLIES) ×1 IMPLANT
PASSER SUT SWANSON 36MM LOOP (INSTRUMENTS) ×3 IMPLANT
PENCIL BUTTON HOLSTER BLD 10FT (ELECTRODE) ×2 IMPLANT
RETRIEVER SUT HEWSON (MISCELLANEOUS) ×2 IMPLANT
SPONGE LAP 18X18 X RAY DECT (DISPOSABLE) ×3 IMPLANT
STAPLER VISISTAT 35W (STAPLE) ×1 IMPLANT
STOCKINETTE IMPERVIOUS LG (DRAPES) ×3 IMPLANT
STRIP CLOSURE SKIN 1/2X4 (GAUZE/BANDAGES/DRESSINGS) ×1 IMPLANT
SUCTION FRAZIER HANDLE 10FR (MISCELLANEOUS) ×2
SUCTION TUBE FRAZIER 10FR DISP (MISCELLANEOUS) ×1 IMPLANT
SUT ETHIBOND 2 OS 4 DA (SUTURE) IMPLANT
SUT FIBERWIRE #2 38 T-5 BLUE (SUTURE) ×15
SUT FIBERWIRE #5 38 CONV NDL (SUTURE) ×6
SUT MNCRL AB 3-0 PS2 18 (SUTURE) ×2 IMPLANT
SUT VIC AB 0 CT1 27 (SUTURE) ×3
SUT VIC AB 0 CT1 27XBRD ANBCTR (SUTURE) ×2 IMPLANT
SUT VIC AB 1 CT1 27 (SUTURE) ×3
SUT VIC AB 1 CT1 27XBRD ANBCTR (SUTURE) ×2 IMPLANT
SUT VIC AB 2-0 CT1 27 (SUTURE) ×3
SUT VIC AB 2-0 CT1 TAPERPNT 27 (SUTURE) ×2 IMPLANT
SUTURE FIBERWR #2 38 T-5 BLUE (SUTURE) IMPLANT
SUTURE FIBERWR #5 38 CONV NDL (SUTURE) IMPLANT
SYR CONTROL 10ML LL (SYRINGE) IMPLANT
TOWEL OR 17X24 6PK STRL BLUE (TOWEL DISPOSABLE) ×3 IMPLANT
TOWEL OR 17X26 10 PK STRL BLUE (TOWEL DISPOSABLE) ×3 IMPLANT
TRAY FOLEY W/METER SILVER 16FR (SET/KITS/TRAYS/PACK) IMPLANT
TUBE CONNECTING 12'X1/4 (SUCTIONS) ×1
TUBE CONNECTING 12X1/4 (SUCTIONS) ×2 IMPLANT
WATER STERILE IRR 1000ML POUR (IV SOLUTION) ×6 IMPLANT
YANKAUER SUCT BULB TIP NO VENT (SUCTIONS) ×3 IMPLANT

## 2016-08-22 NOTE — Interval H&P Note (Signed)
History and Physical Interval Note:  08/22/2016 12:14 PM  Derrick Moon  has presented today for surgery, with the diagnosis of Left quad tendon rupture  The various methods of treatment have been discussed with the patient and family. After consideration of risks, benefits and other options for treatment, the patient has consented to  Procedure(s): LEFT OPEN QUADRICEP TENDON REPAIR (Left) as a surgical intervention .  The patient's history has been reviewed, patient examined, no change in status, stable for surgery.  I have reviewed the patient's chart and labs.  Questions were answered to the patient's satisfaction.     Earline Stiner,STEVEN R

## 2016-08-22 NOTE — Anesthesia Preprocedure Evaluation (Addendum)
Anesthesia Evaluation  Patient identified by MRN, date of birth, ID band Patient awake    Reviewed: Allergy & Precautions, NPO status , Patient's Chart, lab work & pertinent test results  Airway Mallampati: II   Neck ROM: Full    Dental no notable dental hx.    Pulmonary shortness of breath, sleep apnea , COPD, former smoker,    breath sounds clear to auscultation       Cardiovascular hypertension, + Peripheral Vascular Disease   Rhythm:Regular Rate:Normal     Neuro/Psych  Neuromuscular disease    GI/Hepatic hiatal hernia, GERD  ,  Endo/Other    Renal/GU      Musculoskeletal  (+) Arthritis ,   Abdominal   Peds  Hematology   Anesthesia Other Findings   Reproductive/Obstetrics                            Anesthesia Physical Anesthesia Plan  ASA: III  Anesthesia Plan: Spinal   Post-op Pain Management:  Regional for Post-op pain   Induction: Intravenous  Airway Management Planned: Simple Face Mask and Natural Airway  Additional Equipment:   Intra-op Plan:   Post-operative Plan: Extubation in OR  Informed Consent: I have reviewed the patients History and Physical, chart, labs and discussed the procedure including the risks, benefits and alternatives for the proposed anesthesia with the patient or authorized representative who has indicated his/her understanding and acceptance.     Plan Discussed with:   Anesthesia Plan Comments:        Anesthesia Quick Evaluation
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
## 2016-08-22 NOTE — Anesthesia Procedure Notes (Signed)
Spinal  Patient location during procedure: OR Start time: 08/22/2016 12:35 PM End time: 08/22/2016 12:45 PM Staffing Anesthesiologist: Rica Koyanagi Performed: anesthesiologist  Preanesthetic Checklist Completed: patient identified, site marked, surgical consent, pre-op evaluation, timeout performed, IV checked, risks and benefits discussed and monitors and equipment checked Spinal Block Patient position: sitting Prep: Betadine Patient monitoring: heart rate, cardiac monitor, continuous pulse ox and blood pressure Approach: midline Location: L3-4 Injection technique: single-shot Needle Needle type: Pencan  Needle gauge: 24 G Needle length: 9 cm Needle insertion depth: 5 cm Assessment Sensory level: T8

## 2016-08-22 NOTE — Discharge Instructions (Signed)
Maintain the knee in extension at all times.  Keep the knee immobilizer on at all times.  Ok to do calf pumps to keep the blood circulating in the leg.  May weight bear through the heel as tolerated with a walker until you see Dr Veverly Fells  Increase baby aspirin 81mg  to twice per day for thirty days to prevent blood clots.  Follow up with Dr Veverly Fells in two weeks in the office [phone]

## 2016-08-22 NOTE — Brief Op Note (Signed)
08/22/2016  1:56 PM  PATIENT:  Derrick Moon  76 y.o. male  PRE-OPERATIVE DIAGNOSIS:  Left quad tendon rupture  POST-OPERATIVE DIAGNOSIS:  Left quad tendon rupture  PROCEDURE:  Procedure(s): LEFT OPEN QUADRICEP TENDON REPAIR (Left)  SURGEON:  Surgeon(s) and Role:    Netta Cedars, MD - Primary  PHYSICIAN ASSISTANT:   ASSISTANTS: Ventura Bruns, PA-C   ANESTHESIA:   regional and spinal  EBL:  No intake/output data recorded.  BLOOD ADMINISTERED:none  DRAINS: none   LOCAL MEDICATIONS USED:  MARCAINE     SPECIMEN:  No Specimen  DISPOSITION OF SPECIMEN:  N/A  COUNTS:  YES  TOURNIQUET:    DICTATION: .Other Dictation: Dictation Number [id number]  PLAN OF CARE: Discharge to home after PACU  PATIENT DISPOSITION:  PACU - hemodynamically stable.   Delay start of Pharmacological VTE agent (>24hrs) due to surgical blood loss or risk of bleeding: not applicable

## 2016-08-22 NOTE — Transfer of Care (Signed)
Immediate Anesthesia Transfer of Care Note  Patient: Derrick Moon  Procedure(s) Performed: Procedure(s): LEFT OPEN QUADRICEP TENDON REPAIR (Left)  Patient Location: PACU  Anesthesia Type:Spinal  Level of Consciousness: awake, alert  and oriented  Airway & Oxygen Therapy: Patient Spontanous Breathing and Patient connected to face mask oxygen  Post-op Assessment: Report given to RN and Post -op Vital signs reviewed and stable  Post vital signs: Reviewed and stable  Last Vitals:  Vitals:   08/22/16 1150 08/22/16 1152  BP: (!) 159/105 (!) 159/105  Pulse: 66 (!) 58  Resp: 15 16  Temp:      Last Pain:  Vitals:   08/22/16 1115  TempSrc: Oral  PainSc:       Patients Stated Pain Goal: 4 (32/20/25 4270)  Complications: No apparent anesthesia complications

## 2016-08-22 NOTE — Anesthesia Procedure Notes (Signed)
Procedure Name: MAC Date/Time: 08/22/2016 12:47 PM Performed by: Kyung Rudd Pre-anesthesia Checklist: Patient identified, Emergency Drugs available, Suction available and Patient being monitored Patient Re-evaluated:Patient Re-evaluated prior to inductionOxygen Delivery Method: Simple face mask Intubation Type: IV induction Placement Confirmation: positive ETCO2

## 2016-08-22 NOTE — Anesthesia Procedure Notes (Addendum)
Anesthesia Regional Block: Adductor canal block   Pre-Anesthetic Checklist: ,, timeout performed,, Correct Site, Correct Laterality, Correct Procedure, Correct Position, site marked, risks and benefits discussed, Surgical consent,  Pre-op evaluation,  At surgeon's request and post-op pain management  Laterality: Left and Lower  Prep: chloraprep       Needles:   Needle Type: Echogenic Stimulator Needle     Needle Length: 9cm  Needle Gauge: 21   Needle insertion depth: 5 cm   Additional Needles:   Procedures: ultrasound guided,,,,,,,,  Narrative:  Start time: 08/22/2016 11:35 AM End time: 08/22/2016 11:50 AM Injection made incrementally with aspirations every 5 mL.  Performed by: Personally  Anesthesiologist: Sander Speckman

## 2016-08-25 ENCOUNTER — Encounter (HOSPITAL_COMMUNITY): Payer: Self-pay | Admitting: Orthopedic Surgery

## 2016-08-25 NOTE — Anesthesia Postprocedure Evaluation (Signed)
Anesthesia Post Note  Patient: Donat H Trombly  Procedure(s) Performed: Procedure(s) (LRB): LEFT OPEN QUADRICEP TENDON REPAIR (Left)     Patient location during evaluation: PACU Anesthesia Type: Spinal Level of consciousness: oriented and awake and alert Pain management: pain level controlled Vital Signs Assessment: post-procedure vital signs reviewed and stable Respiratory status: spontaneous breathing, respiratory function stable and patient connected to nasal cannula oxygen Cardiovascular status: blood pressure returned to baseline and stable Postop Assessment: no headache and no backache Anesthetic complications: no    Last Vitals:  Vitals:   08/22/16 1609 08/22/16 1615  BP:    Pulse:  (!) 58  Resp:  (!) 21  Temp: 36.5 C     Last Pain:  Vitals:   08/22/16 1539  TempSrc:   PainSc: 0-No pain                 Diannah Rindfleisch,JAMES TERRILL

## 2016-09-04 DIAGNOSIS — S76112D Strain of left quadriceps muscle, fascia and tendon, subsequent encounter: Secondary | ICD-10-CM | POA: Diagnosis not present

## 2016-09-16 DIAGNOSIS — I739 Peripheral vascular disease, unspecified: Secondary | ICD-10-CM | POA: Diagnosis not present

## 2016-09-16 DIAGNOSIS — Q828 Other specified congenital malformations of skin: Secondary | ICD-10-CM | POA: Diagnosis not present

## 2016-09-16 DIAGNOSIS — M79671 Pain in right foot: Secondary | ICD-10-CM | POA: Diagnosis not present

## 2016-09-16 DIAGNOSIS — L851 Acquired keratosis [keratoderma] palmaris et plantaris: Secondary | ICD-10-CM | POA: Diagnosis not present

## 2016-09-16 DIAGNOSIS — B351 Tinea unguium: Secondary | ICD-10-CM | POA: Diagnosis not present

## 2016-09-18 DIAGNOSIS — Z79899 Other long term (current) drug therapy: Secondary | ICD-10-CM | POA: Diagnosis not present

## 2016-09-18 DIAGNOSIS — I10 Essential (primary) hypertension: Secondary | ICD-10-CM | POA: Diagnosis not present

## 2016-09-18 DIAGNOSIS — J449 Chronic obstructive pulmonary disease, unspecified: Secondary | ICD-10-CM | POA: Diagnosis not present

## 2016-09-18 DIAGNOSIS — I739 Peripheral vascular disease, unspecified: Secondary | ICD-10-CM | POA: Diagnosis not present

## 2016-09-25 DIAGNOSIS — I10 Essential (primary) hypertension: Secondary | ICD-10-CM | POA: Diagnosis not present

## 2016-09-25 DIAGNOSIS — I714 Abdominal aortic aneurysm, without rupture: Secondary | ICD-10-CM | POA: Diagnosis not present

## 2016-10-02 DIAGNOSIS — S76112D Strain of left quadriceps muscle, fascia and tendon, subsequent encounter: Secondary | ICD-10-CM | POA: Diagnosis not present

## 2016-10-02 DIAGNOSIS — Z4789 Encounter for other orthopedic aftercare: Secondary | ICD-10-CM | POA: Diagnosis not present

## 2016-10-17 DIAGNOSIS — S76112D Strain of left quadriceps muscle, fascia and tendon, subsequent encounter: Secondary | ICD-10-CM | POA: Diagnosis not present

## 2016-11-11 DIAGNOSIS — B351 Tinea unguium: Secondary | ICD-10-CM | POA: Diagnosis not present

## 2016-11-11 DIAGNOSIS — I739 Peripheral vascular disease, unspecified: Secondary | ICD-10-CM | POA: Diagnosis not present

## 2016-11-11 DIAGNOSIS — L851 Acquired keratosis [keratoderma] palmaris et plantaris: Secondary | ICD-10-CM | POA: Diagnosis not present

## 2016-11-14 ENCOUNTER — Ambulatory Visit: Payer: PPO | Admitting: Family

## 2016-11-14 ENCOUNTER — Ambulatory Visit: Payer: PPO | Admitting: Vascular Surgery

## 2016-11-14 ENCOUNTER — Encounter (HOSPITAL_COMMUNITY): Payer: PPO

## 2016-11-14 ENCOUNTER — Other Ambulatory Visit (HOSPITAL_COMMUNITY): Payer: PPO

## 2016-11-18 DIAGNOSIS — H43813 Vitreous degeneration, bilateral: Secondary | ICD-10-CM | POA: Diagnosis not present

## 2016-11-18 DIAGNOSIS — H01025 Squamous blepharitis left lower eyelid: Secondary | ICD-10-CM | POA: Diagnosis not present

## 2016-11-18 DIAGNOSIS — H01022 Squamous blepharitis right lower eyelid: Secondary | ICD-10-CM | POA: Diagnosis not present

## 2016-11-18 DIAGNOSIS — H01021 Squamous blepharitis right upper eyelid: Secondary | ICD-10-CM | POA: Diagnosis not present

## 2016-11-18 DIAGNOSIS — H01024 Squamous blepharitis left upper eyelid: Secondary | ICD-10-CM | POA: Diagnosis not present

## 2016-11-18 DIAGNOSIS — H2513 Age-related nuclear cataract, bilateral: Secondary | ICD-10-CM | POA: Diagnosis not present

## 2016-11-19 DIAGNOSIS — S76111S Strain of right quadriceps muscle, fascia and tendon, sequela: Secondary | ICD-10-CM | POA: Diagnosis not present

## 2016-11-19 DIAGNOSIS — M6281 Muscle weakness (generalized): Secondary | ICD-10-CM | POA: Diagnosis not present

## 2016-11-19 DIAGNOSIS — M25562 Pain in left knee: Secondary | ICD-10-CM | POA: Diagnosis not present

## 2016-11-19 DIAGNOSIS — M62552 Muscle wasting and atrophy, not elsewhere classified, left thigh: Secondary | ICD-10-CM | POA: Diagnosis not present

## 2016-11-19 DIAGNOSIS — M25662 Stiffness of left knee, not elsewhere classified: Secondary | ICD-10-CM | POA: Diagnosis not present

## 2016-11-21 ENCOUNTER — Ambulatory Visit: Payer: PPO | Admitting: Family

## 2016-11-25 DIAGNOSIS — M25662 Stiffness of left knee, not elsewhere classified: Secondary | ICD-10-CM | POA: Diagnosis not present

## 2016-11-25 DIAGNOSIS — S76111S Strain of right quadriceps muscle, fascia and tendon, sequela: Secondary | ICD-10-CM | POA: Diagnosis not present

## 2016-11-25 DIAGNOSIS — M25562 Pain in left knee: Secondary | ICD-10-CM | POA: Diagnosis not present

## 2016-11-25 DIAGNOSIS — M6281 Muscle weakness (generalized): Secondary | ICD-10-CM | POA: Diagnosis not present

## 2016-11-25 DIAGNOSIS — M62552 Muscle wasting and atrophy, not elsewhere classified, left thigh: Secondary | ICD-10-CM | POA: Diagnosis not present

## 2016-11-26 DIAGNOSIS — Z79899 Other long term (current) drug therapy: Secondary | ICD-10-CM | POA: Diagnosis not present

## 2016-11-26 DIAGNOSIS — E785 Hyperlipidemia, unspecified: Secondary | ICD-10-CM | POA: Diagnosis not present

## 2016-11-27 DIAGNOSIS — S76111S Strain of right quadriceps muscle, fascia and tendon, sequela: Secondary | ICD-10-CM | POA: Diagnosis not present

## 2016-11-27 DIAGNOSIS — M62552 Muscle wasting and atrophy, not elsewhere classified, left thigh: Secondary | ICD-10-CM | POA: Diagnosis not present

## 2016-11-27 DIAGNOSIS — M6281 Muscle weakness (generalized): Secondary | ICD-10-CM | POA: Diagnosis not present

## 2016-11-27 DIAGNOSIS — M25662 Stiffness of left knee, not elsewhere classified: Secondary | ICD-10-CM | POA: Diagnosis not present

## 2016-11-27 DIAGNOSIS — M25562 Pain in left knee: Secondary | ICD-10-CM | POA: Diagnosis not present

## 2016-11-29 DIAGNOSIS — S76111S Strain of right quadriceps muscle, fascia and tendon, sequela: Secondary | ICD-10-CM | POA: Diagnosis not present

## 2016-11-29 DIAGNOSIS — M62552 Muscle wasting and atrophy, not elsewhere classified, left thigh: Secondary | ICD-10-CM | POA: Diagnosis not present

## 2016-11-29 DIAGNOSIS — M6281 Muscle weakness (generalized): Secondary | ICD-10-CM | POA: Diagnosis not present

## 2016-11-29 DIAGNOSIS — M25562 Pain in left knee: Secondary | ICD-10-CM | POA: Diagnosis not present

## 2016-11-29 DIAGNOSIS — M25662 Stiffness of left knee, not elsewhere classified: Secondary | ICD-10-CM | POA: Diagnosis not present

## 2016-12-01 DIAGNOSIS — M25662 Stiffness of left knee, not elsewhere classified: Secondary | ICD-10-CM | POA: Diagnosis not present

## 2016-12-01 DIAGNOSIS — M62552 Muscle wasting and atrophy, not elsewhere classified, left thigh: Secondary | ICD-10-CM | POA: Diagnosis not present

## 2016-12-01 DIAGNOSIS — M6281 Muscle weakness (generalized): Secondary | ICD-10-CM | POA: Diagnosis not present

## 2016-12-01 DIAGNOSIS — S76111S Strain of right quadriceps muscle, fascia and tendon, sequela: Secondary | ICD-10-CM | POA: Diagnosis not present

## 2016-12-01 DIAGNOSIS — M25562 Pain in left knee: Secondary | ICD-10-CM | POA: Diagnosis not present

## 2016-12-03 DIAGNOSIS — M25562 Pain in left knee: Secondary | ICD-10-CM | POA: Diagnosis not present

## 2016-12-03 DIAGNOSIS — M62552 Muscle wasting and atrophy, not elsewhere classified, left thigh: Secondary | ICD-10-CM | POA: Diagnosis not present

## 2016-12-03 DIAGNOSIS — J441 Chronic obstructive pulmonary disease with (acute) exacerbation: Secondary | ICD-10-CM | POA: Diagnosis not present

## 2016-12-03 DIAGNOSIS — I714 Abdominal aortic aneurysm, without rupture: Secondary | ICD-10-CM | POA: Diagnosis not present

## 2016-12-03 DIAGNOSIS — M25662 Stiffness of left knee, not elsewhere classified: Secondary | ICD-10-CM | POA: Diagnosis not present

## 2016-12-03 DIAGNOSIS — M6281 Muscle weakness (generalized): Secondary | ICD-10-CM | POA: Diagnosis not present

## 2016-12-03 DIAGNOSIS — S76111S Strain of right quadriceps muscle, fascia and tendon, sequela: Secondary | ICD-10-CM | POA: Diagnosis not present

## 2016-12-09 DIAGNOSIS — M25662 Stiffness of left knee, not elsewhere classified: Secondary | ICD-10-CM | POA: Diagnosis not present

## 2016-12-09 DIAGNOSIS — M25562 Pain in left knee: Secondary | ICD-10-CM | POA: Diagnosis not present

## 2016-12-09 DIAGNOSIS — M62552 Muscle wasting and atrophy, not elsewhere classified, left thigh: Secondary | ICD-10-CM | POA: Diagnosis not present

## 2016-12-09 DIAGNOSIS — M6281 Muscle weakness (generalized): Secondary | ICD-10-CM | POA: Diagnosis not present

## 2016-12-09 DIAGNOSIS — S76111S Strain of right quadriceps muscle, fascia and tendon, sequela: Secondary | ICD-10-CM | POA: Diagnosis not present

## 2016-12-10 ENCOUNTER — Other Ambulatory Visit: Payer: Self-pay | Admitting: Internal Medicine

## 2016-12-10 DIAGNOSIS — S76112D Strain of left quadriceps muscle, fascia and tendon, subsequent encounter: Secondary | ICD-10-CM | POA: Diagnosis not present

## 2016-12-12 ENCOUNTER — Ambulatory Visit (INDEPENDENT_AMBULATORY_CARE_PROVIDER_SITE_OTHER)
Admission: RE | Admit: 2016-12-12 | Discharge: 2016-12-12 | Disposition: A | Discharge status: Home / Self Care | Payer: PPO | Source: Ambulatory Visit | Attending: Internal Medicine | Admitting: Internal Medicine

## 2016-12-12 ENCOUNTER — Ambulatory Visit (INDEPENDENT_AMBULATORY_CARE_PROVIDER_SITE_OTHER): Payer: PPO | Admitting: Internal Medicine

## 2016-12-12 ENCOUNTER — Encounter: Payer: Self-pay | Admitting: Internal Medicine

## 2016-12-12 VITALS — BP 152/78 | HR 68 | Ht 71.0 in | Wt 201.6 lb

## 2016-12-12 DIAGNOSIS — R05 Cough: Secondary | ICD-10-CM | POA: Diagnosis not present

## 2016-12-12 DIAGNOSIS — F17200 Nicotine dependence, unspecified, uncomplicated: Secondary | ICD-10-CM

## 2016-12-12 DIAGNOSIS — J449 Chronic obstructive pulmonary disease, unspecified: Secondary | ICD-10-CM

## 2016-12-12 DIAGNOSIS — I10 Essential (primary) hypertension: Secondary | ICD-10-CM

## 2016-12-12 MED ORDER — TIOTROPIUM BROMIDE MONOHYDRATE 2.5 MCG/ACT IN AERS: 2.0000 | INHALATION_SPRAY | Freq: Every day | RESPIRATORY_TRACT | 0 refills | Status: DC

## 2016-12-12 MED ORDER — TIOTROPIUM BROMIDE MONOHYDRATE 2.5 MCG/ACT IN AERS: 2.0000 | INHALATION_SPRAY | Freq: Every day | RESPIRATORY_TRACT | 11 refills | Status: DC

## 2016-12-12 MED ORDER — IRBESARTAN 150 MG PO TABS: 150.0000 mg | ORAL_TABLET | Freq: Every day | ORAL | 11 refills | Status: DC

## 2016-12-12 NOTE — Progress Notes (Signed)
Spoke with pt and notified of results per Dr. Wert. Pt verbalized understanding and denied any questions. 

## 2016-12-12 NOTE — Patient Instructions (Addendum)
Plan A = Automatic = symbicort 160 x2 puffs followed by spiriva 2 puffs first thing in am then 12 hours later 2 pffs symbicort only  Work on inhaler technique:  relax and gently blow all the way out then take a nice smooth deep breath back in, triggering the inhaler at same time you start breathing in.  Hold for up to 5 seconds if you can. Blow out thru nose. Rinse and gargle with water when done     Plan B = Backup Only use your albuterol as a rescue medication to be used if you can't catch your breath by resting or doing a relaxed purse lip breathing pattern.  - The less you use it, the better it will work when you need it. - Ok to use the inhaler up to 2 puffs  every 4 hours if you must but call for appointment if use goes up over your usual need - Don't leave home without it !!  (think of it like the spare tire for your car)    Please remember to go to the  x-ray department downstairs in the basement  for your tests - we will call you with the results when they are available.      The key is to stop smoking completely before smoking completely stops you!     Please schedule a follow up office visit in 4 weeks, sooner if needed

## 2016-12-12 NOTE — Progress Notes (Signed)
Subjective:     Patient ID: Derrick Moon, male   DOB: 11/06/1940   MRN: [id number]      .Brief patient profile:  75 yowm quit smoking cigs 1990 / still smoking some cigars referred to pulmonary clinic 12/12/2015 by Dr   Willey Blade for sob s/p neg cards eval by Dr Dwana Curd (though doen'st look like pt completed the myocardial perfusion study scheduled for 08/15/15) with COPD GOLD IIdx   .History of Present Illness  12/12/2015 1st [facility] office visit/ Derrick Moon   Chief Complaint  Patient presents with  . Pulmonary Consult    Referred by Dr. [name]. Pt c/o dizziness with exertion for the past 6 months. His spouse states after he has been outside working, he comes in SOB.  He has prod cough with clear sputum in the am's.   variably noisy breathing x 6 months seems to bother wife more than husband  MMRC2 = can't walk a nl pace on a flat grade s sob but does fine slow and flat eg grocery shopping  Sleeping fine off cpap but wakes up every am with throat congestion/ uses halls  spiriva not helping / hfa may help/ prednisone does not help rec avapro  150 mg one daily - if light headed when you stand up break it in half  Stop spiriva and lisinopril and the halls for now GERD diet Only use your albuterol as a rescue medication     01/09/2016  f/u ov/Derrick Moon re:  GOLD II, no maint rx/using saba bid most days / still smoking cigars  Chief Complaint  Patient presents with  . Shortness of Breath    Breathing is improved since last OV. Reports slight SOB and chest congestion. Denies  chest tightness, wheezing or coughing.   tends to needVentolin first thing am for am "congestion" Can now walk the dog s sob/ cp = MMRC2 = can't walk a nl pace on a flat grade s sob but does fine slow and flat eg shopping/ walking the dog  rec Plan A = Automatic = Symbicort 160 Take 2 puffs first thing in am and then another 2 puffs about 12 hours later.  Plan B = Backup Only use your albuterol as a rescue  medication     12/12/2016  f/u ov/Derrick Moon re: copd GOLD II/ symb 160 2bid and daily albuterol around 2 pm Chief Complaint  Patient presents with  . Follow-up    Pt states he is here for his annual followup.  He states his breathing is "pretty good".  He states he was seen by his PCP on 12/03/16 and was dxed with lung infection- txed with pred and doxy.  He has some cough with large amounts of clear sputum.  He is using his albuterol inhaler 1 x daily on average.   worse for several months cough/ congestion esp after supper, does ok at hs/noct  But wakes up most  ams with  One tbsp thick mucus while on symbicort rx pred/doxy > 50% improved / still smoking mostly cigas  Much better breathing  indoors vs outdoors but still mmrc = 2  Needing benadryl to sleep due to sense of pnds   Needing sba at least once daily   No obvious day to day or daytime variability or assoc excess/ purulent sputum or mucus plugs or hemoptysis or cp or chest tightness, subjective wheeze or overt sinus or hb symptoms. No unusual exp hx or h/o childhood pna/ asthma or knowledge of premature  birth.  Sleeping ok most nights flat without nocturnal    exacerbation  of respiratory  c/o's or need for noct saba. Also denies any obvious fluctuation of symptoms with weather or environmental changes or other aggravating or alleviating factors except as outlined above   Current Allergies, Complete Past Medical History, Past Surgical History, Family History, and Social History were reviewed in [employer] record.  ROS  The following are not active complaints unless bolded sore throat, dysphagia, dental problems, itching, sneezing,  nasal congestion or disharge of excess mucus or purulent secretions, ear ache,   fever, chills, sweats, unintended wt loss or wt gain, classically pleuritic or exertional cp,  orthopnea pnd or leg swelling, presyncope, palpitations, abdominal pain, anorexia, nausea, vomiting, diarrhea  or  change in bowel habits or bladder habits, change in stools or change in urine, dysuria, hematuria,  rash, arthralgias, visual complaints, headache, numbness, weakness or ataxia or problems with walking or coordination,  change in mood/affect or memory.        Current Meds  Medication Sig  . albuterol (PROVENTIL HFA;VENTOLIN HFA) 108 (90 BASE) MCG/ACT inhaler Inhale 1-2 puffs into the lungs every 6 (six) hours as needed for wheezing or shortness of breath. Reported on 10/03/2015  . aspirin EC 81 MG tablet Take 1 tablet (81 mg total) by mouth 2 (two) times daily.  . budesonide-formoterol (SYMBICORT) 160-4.5 MCG/ACT inhaler Inhale 2 puffs into the lungs 2 (two) times daily.  . diphenhydrAMINE (BENADRYL) 25 MG tablet Take 25 mg by mouth every 6 (six) hours as needed.  . irbesartan (AVAPRO) 150 MG tablet Take 1 tablet (150 mg total) by mouth daily.  . methocarbamol (ROBAXIN) 500 MG tablet Take 1 tablet (500 mg total) by mouth 3 (three) times daily as needed.  . Tamsulosin HCl (FLOMAX) 0.4 MG CAPS Take 0.4 mg by mouth 2 (two) times daily.   . [DISCONTINUED] irbesartan (AVAPRO) 150 MG tablet Take 1 tablet (150 mg total) by mouth daily.                         Objective:   Physical Exam  amb wm nad   12/12/2016        201  01/09/2016      198   12/12/15 198 lb (89.8 kg)  11/09/15 201 lb 12.8 oz (91.5 kg)  10/03/15 199 lb (90.3 kg)    Vital signs reviewed -   - Note on arrival 02 sats  94% on RA and bp 152/78      HEENT: poor  dentition, nl turbinates, and oropharynx. Nl external ear canals without cough reflex   NECK :  without JVD/Nodes/TM/ nl carotid upstrokes bilaterally   LUNGS: no acc muscle use,  Nl contour chest / exp rhonchi sym bilaterally with cough on FVC  CV:  RRR  no s3 or murmur or increase in P2, no edema   ABD:  soft and nontender with nl inspiratory excursion in the supine position. No bruits or organomegaly, bowel sounds nl  MS:  Nl gait/ ext warm without  deformities, calf tenderness, cyanosis or clubbing No obvious joint restrictions   SKIN: warm and dry without lesions    NEURO:  alert, approp, nl sensorium with  no motor deficits     CXR PA and Lateral:   12/12/2016 :    I personally reviewed images and agree with radiology impression as follows:   No acute abnormality. Stable changes of COPD  and chronic bronchitis.     Assessment:

## 2016-12-14 DIAGNOSIS — F17210 Nicotine dependence, cigarettes, uncomplicated: Secondary | ICD-10-CM | POA: Insufficient documentation

## 2016-12-14 NOTE — Assessment & Plan Note (Signed)
Trial off acei 12/12/2015 >> improved symptoms as of 01/09/2016 > stay off acei indefinitely   Marginally Adequate control on present rx, reviewed in detail with pt > no change in rx needed  > Follow up per Primary Care planned

## 2016-12-14 NOTE — Assessment & Plan Note (Signed)
-   PFTs  05/27/12   FEV1  2.04(62%) ratio 54  - 12/12/2015 try off spiriva since pt says not working > increased need for saba - Spirometry 01/09/2016  FEV1 1.19 (47%)  Ratio 50 p nothing prior and classic curvature on f/v loop - 01/09/2016  After extensive coaching HFA effectiveness =    75% try symbicort 160 2bid   - 12/12/2016  After extensive coaching devie effectiveness =    75% with smi > add spiriva 2 each am   DDX of  difficult airways management almost all start with A and  include Adherence, Ace Inhibitors, Acid Reflux, Active Sinus Disease, Alpha 1 Antitripsin deficiency, Anxiety masquerading as Airways dz,  ABPA,  Allergy(esp in young), Aspiration (esp in elderly), Adverse effects of meds,  Active smokers, A bunch of PE's (a small clot burden can't cause this syndrome unless there is already severe underlying pulm or vascular dz with poor reserve) plus two Bs  = Bronchiectasis and Beta blocker use..and one C= CHF   Adherence is always the initial "prime suspect" and is a multilayered concern that requires a "trust but verify" approach in every patient - starting with knowing how to use medications, especially inhalers, correctly, keeping up with refills and understanding the fundamental difference between maintenance and prns vs those medications only taken for a very short course and then stopped and not refilled.  - see hfa teaching   Active smoking (see separate a/p)   ? Allergy/ sinus dz > consider sinus ct next if not better p add spiriva    I had an extended discussion with the patient reviewing all relevant studies completed to date and  lasting 15 to 20 minutes of a 25 minute visit    Each maintenance medication was reviewed in detail including most importantly the difference between maintenance and prns and under what circumstances the prns are to be triggered using an action plan format that is not reflected in the computer generated alphabetically organized AVS.    Please see  AVS for specific instructions unique to this visit that I personally wrote and verbalized to the the pt in detail and then reviewed with pt  by my nurse highlighting any  changes in therapy recommended at today's visit to their plan of care.

## 2016-12-14 NOTE — Assessment & Plan Note (Signed)
Although reports only cigars, since he was previously a cigarette smoker strongly suspect he is inhaling   > 3 min > 3 min discussion  I emphasized that although we never turn away smokers from the pulmonary clinic, we do ask that they understand that the recommendations that we make  won't work nearly as well in the presence of continued cigar exposure.  In fact, we may very well  reach a point where we can't promise to help the patient if he/she can't quit smoking. (We can and will promise to try to help, we just can't promise what we recommend will really work)

## 2016-12-30 DIAGNOSIS — M25512 Pain in left shoulder: Secondary | ICD-10-CM | POA: Diagnosis not present

## 2017-01-01 ENCOUNTER — Other Ambulatory Visit (HOSPITAL_COMMUNITY): Payer: PPO

## 2017-01-01 ENCOUNTER — Encounter (HOSPITAL_COMMUNITY): Payer: PPO

## 2017-01-01 ENCOUNTER — Ambulatory Visit: Payer: PPO | Admitting: Family

## 2017-01-07 ENCOUNTER — Encounter: Payer: Self-pay | Admitting: Family

## 2017-01-07 ENCOUNTER — Ambulatory Visit (INDEPENDENT_AMBULATORY_CARE_PROVIDER_SITE_OTHER): Payer: PPO | Admitting: Family

## 2017-01-07 ENCOUNTER — Ambulatory Visit (HOSPITAL_COMMUNITY)
Admission: RE | Admit: 2017-01-07 | Discharge: 2017-01-07 | Disposition: A | Discharge status: Home / Self Care | Payer: PPO | Source: Ambulatory Visit | Attending: Vascular Surgery | Admitting: Vascular Surgery

## 2017-01-07 ENCOUNTER — Ambulatory Visit (INDEPENDENT_AMBULATORY_CARE_PROVIDER_SITE_OTHER)
Admission: RE | Admit: 2017-01-07 | Discharge: 2017-01-07 | Disposition: A | Discharge status: Home / Self Care | Payer: PPO | Source: Ambulatory Visit | Attending: Vascular Surgery | Admitting: Vascular Surgery

## 2017-01-07 VITALS — BP 152/90 | HR 57 | Temp 97.1°F | Resp 20 | Ht 71.0 in | Wt 198.2 lb

## 2017-01-07 DIAGNOSIS — I70235 Atherosclerosis of native arteries of right leg with ulceration of other part of foot: Secondary | ICD-10-CM | POA: Insufficient documentation

## 2017-01-07 DIAGNOSIS — I714 Abdominal aortic aneurysm, without rupture, unspecified: Secondary | ICD-10-CM

## 2017-01-07 DIAGNOSIS — F17290 Nicotine dependence, other tobacco product, uncomplicated: Secondary | ICD-10-CM | POA: Diagnosis not present

## 2017-01-07 DIAGNOSIS — R9439 Abnormal result of other cardiovascular function study: Secondary | ICD-10-CM | POA: Diagnosis not present

## 2017-01-07 DIAGNOSIS — Z95820 Peripheral vascular angioplasty status with implants and grafts: Secondary | ICD-10-CM | POA: Insufficient documentation

## 2017-01-07 DIAGNOSIS — I779 Disorder of arteries and arterioles, unspecified: Secondary | ICD-10-CM | POA: Diagnosis not present

## 2017-01-07 NOTE — Patient Instructions (Addendum)
Steps to Quit Smoking Smoking tobacco can be bad for your health. It can also affect almost every organ in your body. Smoking puts you and people around you at risk for many serious long-lasting (chronic) diseases. Quitting smoking is hard, but it is one of the best things that you can do for your health. It is never too late to quit. What are the benefits of quitting smoking? When you quit smoking, you lower your risk for getting serious diseases and conditions. They can include:  Lung cancer or lung disease.  Heart disease.  Stroke.  Heart attack.  Not being able to have children (infertility).  Weak bones (osteoporosis) and broken bones (fractures).  If you have coughing, wheezing, and shortness of breath, those symptoms may get better when you quit. You may also get sick less often. If you are pregnant, quitting smoking can help to lower your chances of having a baby of low birth weight. What can I do to help me quit smoking? Talk with your doctor about what can help you quit smoking. Some things you can do (strategies) include:  Quitting smoking totally, instead of slowly cutting back how much you smoke over a period of time.  Going to in-person counseling. You are more likely to quit if you go to many counseling sessions.  Using resources and support systems, such as: ? Online chats with a counselor. ? Phone quitlines. ? Printed self-help materials. ? Support groups or group counseling. ? Text messaging programs. ? Mobile phone apps or applications.  Taking medicines. Some of these medicines may have nicotine in them. If you are pregnant or breastfeeding, do not take any medicines to quit smoking unless your doctor says it is okay. Talk with your doctor about counseling or other things that can help you.  Talk with your doctor about using more than one strategy at the same time, such as taking medicines while you are also going to in-person counseling. This can help make  quitting easier. What things can I do to make it easier to quit? Quitting smoking might feel very hard at first, but there is a lot that you can do to make it easier. Take these steps:  Talk to your family and friends. Ask them to support and encourage you.  Call phone quitlines, reach out to support groups, or work with a counselor.  Ask people who smoke to not smoke around you.  Avoid places that make you want (trigger) to smoke, such as: ? Bars. ? Parties. ? Smoke-break areas at work.  Spend time with people who do not smoke.  Lower the stress in your life. Stress can make you want to smoke. Try these things to help your stress: ? Getting regular exercise. ? Deep-breathing exercises. ? Yoga. ? Meditating. ? Doing a body scan. To do this, close your eyes, focus on one area of your body at a time from head to toe, and notice which parts of your body are tense. Try to relax the muscles in those areas.  Download or buy apps on your mobile phone or tablet that can help you stick to your quit plan. There are many free apps, such as QuitGuide from the CDC (Centers for Disease Control and Prevention). You can find more support from smokefree.gov and other websites.  This information is not intended to replace advice given to you by your health care provider. Make sure you discuss any questions you have with your health care provider. Document Released: 01/04/2009 Document   Revised: 11/06/2015 Document Reviewed: 07/25/2014 Elsevier Interactive Patient Education  2018 [employer].      Peripheral Vascular Disease Peripheral vascular disease (PVD) is a disease of the blood vessels that are not part of your heart and brain. A simple term for PVD is poor circulation. In most cases, PVD narrows the blood vessels that carry blood from your heart to the rest of your body. This can result in a decreased supply of blood to your arms, legs, and internal organs, like your stomach or kidneys.  However, it most often affects a person's lower legs and feet. There are two types of PVD.  Organic PVD. This is the more common type. It is caused by damage to the structure of blood vessels.  Functional PVD. This is caused by conditions that make blood vessels contract and tighten (spasm).  Without treatment, PVD tends to get worse over time. PVD can also lead to acute ischemic limb. This is when an arm or limb suddenly has trouble getting enough blood. This is a medical emergency. Follow these instructions at home:  Take medicines only as told by your doctor.  Do not use any tobacco products, including cigarettes, chewing tobacco, or electronic cigarettes. If you need help quitting, ask your doctor.  Lose weight if you are overweight, and maintain a healthy weight as told by your doctor.  Eat a diet that is low in fat and cholesterol. If you need help, ask your doctor.  Exercise regularly. Ask your doctor for some good activities for you.  Take good care of your feet. ? Wear comfortable shoes that fit well. ? Check your feet often for any cuts or sores. Contact a doctor if:  You have cramps in your legs while walking.  You have leg pain when you are at rest.  You have coldness in a leg or foot.  Your skin changes.  You are unable to get or have an erection (erectile dysfunction).  You have cuts or sores on your feet that are not healing. Get help right away if:  Your arm or leg turns cold and blue.  Your arms or legs become red, warm, swollen, painful, or numb.  You have chest pain or trouble breathing.  You suddenly have weakness in your face, arm, or leg.  You become very confused or you cannot speak.  You suddenly have a very bad headache.  You suddenly cannot see. This information is not intended to replace advice given to you by your health care provider. Make sure you discuss any questions you have with your health care provider. Document Released:  06/04/2009 Document Revised: 08/16/2015 Document Reviewed: 08/18/2013 Elsevier Interactive Patient Education  2017 [employer].     Abdominal Aortic Aneurysm Blood pumps away from the heart through tubes (blood vessels) called arteries. Aneurysms are weak or damaged places in the wall of an artery. It bulges out like a balloon. An abdominal aortic aneurysm happens in the main artery of the body (aorta). It can burst or tear, causing bleeding inside the body. This is an emergency. It needs treatment right away. What are the causes? The exact cause is unknown. Things that could cause this problem include:  Fat and other substances building up in the lining of a tube.  Swelling of the walls of a blood vessel.  Certain tissue diseases.  Belly (abdominal) trauma.  An infection in the main artery of the body.  What increases the risk? There are things that make it more likely  for you to have an aneurysm. These include:  Being over the age of 77 years old.  Having high blood pressure (hypertension).  Being a male.  Being white.  Being very overweight (obese).  Having a family history of aneurysm.  Using tobacco products.  What are the signs or symptoms? Symptoms depend on the size of the aneurysm and how fast it grows. There may not be symptoms. If symptoms occur, they can include:  Pain (belly, side, lower back, or groin).  Feeling full after eating a small amount of food.  Feeling sick to your stomach (nauseous), throwing up (vomiting), or both.  Feeling a lump in your belly that feels like it is beating (pulsating).  Feeling like you will pass out (faint).  How is this treated?  Medicine to control blood pressure and pain.  Imaging tests to see if the aneurysm gets bigger.  Surgery. How is this prevented? To lessen your chance of getting this condition:  Stop smoking. Stop chewing tobacco.  Limit or avoid alcohol.  Keep your blood pressure, blood sugar,  and cholesterol within normal limits.  Eat less salt.  Eat foods low in saturated fats and cholesterol. These are found in animal and whole dairy products.  Eat more fiber. Fiber is found in whole grains, vegetables, and fruits.  Keep a healthy weight.  Stay active and exercise often.  This information is not intended to replace advice given to you by your health care provider. Make sure you discuss any questions you have with your health care provider. Document Released: 07/05/2012 Document Revised: 08/16/2015 Document Reviewed: 04/09/2012 Elsevier Interactive Patient Education  2017 [employer].

## 2017-01-07 NOTE — Progress Notes (Signed)
[facility] [place] HISTORY AND PHYSICAL   CC: Follow up peripheral artery occlusive disease   History of Present Illness:   Derrick Moon is a 76 y.o. male who is s/p Right common femoral artery to above-the-knee popliteal artery bypass with ipsilateral non-reversed greater saphenous vein on 09-19-15 by Dr. Bridgett Larsson for critical limb ischemia and ulcers of right foot.  FINDING(S): 1.  Soft common femoral artery with calcified distal external iliac artery and profunda femoral artery  2.  Diseased but soft above-the-knee popliteal artery (<50% stenosis) 3.  Sclerotic appearing greater saphenous vein with some evidence of prior episode of phlebitis 4.  Multiphasic posterior tibial artery and anterior tibial artery signals   His right foot ulcers have healed, pt states these healed about a week after the 09-19-15 surgery.  He sees a podiatrist on a regular basis, has a callus or hyperplasia of skin on the plantar aspect of his right foot that is painful at times when walking.   He also has  AAA that has been monitored.  Previous studies demonstrate an AAA, measuring 2.8 cm by CT in 2005.  The patient denies back or abdominal pain.  The patient denies claudication in legs with walking, denies non healing wounds. The patient denies history of stroke or TIA symptoms.  He states he has had shortness of breath for about a year.   Dr. Bridgett Larsson last evaluated pt on 04/25/16. At that time he advised BLE ABI and bypass duplex in 6 month, AAA duplex can be repeat in one year.    He injured his left knee May, 2018, surgical repair of left knee in June 2018. He finished physical therapy.     Pt Diabetic: No Pt smoker: former smoker, quit cigarettes in 1991, still smoking a cigar about 3-4/week   Pt meds include: Statin :Yes, pravastatin 20 mg 3/week, if takes daily he has nausea Betablocker: No ASA: Yes, 81 mg x 2 daily Other anticoagulants/antiplatelets: no   Current  Outpatient Prescriptions  Medication Sig Dispense Refill  . albuterol (PROVENTIL HFA;VENTOLIN HFA) 108 (90 BASE) MCG/ACT inhaler Inhale 1-2 puffs into the lungs every 6 (six) hours as needed for wheezing or shortness of breath. Reported on 10/03/2015    . aspirin EC 81 MG tablet Take 1 tablet (81 mg total) by mouth 2 (two) times daily. 60 tablet 0  . budesonide-formoterol (SYMBICORT) 160-4.5 MCG/ACT inhaler Inhale 2 puffs into the lungs 2 (two) times daily. 1 Inhaler 11  . diphenhydrAMINE (BENADRYL) 25 MG tablet Take 25 mg by mouth every 6 (six) hours as needed.    . irbesartan (AVAPRO) 150 MG tablet Take 1 tablet (150 mg total) by mouth daily. 30 tablet 11  . methocarbamol (ROBAXIN) 500 MG tablet Take 1 tablet (500 mg total) by mouth 3 (three) times daily as needed. 60 tablet 1  . Tamsulosin HCl (FLOMAX) 0.4 MG CAPS Take 0.4 mg by mouth 2 (two) times daily.     . Tiotropium Bromide Monohydrate (SPIRIVA RESPIMAT) 2.5 MCG/ACT AERS Inhale 2 puffs into the lungs daily. 1 Inhaler 11   No current facility-administered medications for this visit.     Past Medical History:  Diagnosis Date  . AAA (abdominal aortic aneurysm) ([place])   . AAA (abdominal aortic aneurysm) ([place])   . Anxiety    due to pain in leg  . Arthritis    Osteo  . Bell's palsy   . Cancer [facility])    Bladder cancer  . Cataracts,  bilateral   . COPD (chronic obstructive pulmonary disease) ([facility])    Albuterol inhaler as needed  . Diverticulitis   . Enlarged prostate    takes Flomax daily  . GERD (gastroesophageal reflux disease)    was on Prilosec but has been off for yrs-no issues now with reflux  . Headache    migraines years ago  . History of bronchitis    > 3yrs ago  . History of colon polyps    benign  . History of dizziness    several yrs ago-was on Meclizine but has been off for several yrs-no problems since  . History of hiatal hernia   . Hypertension    takes Amlodipine and Lisinpril daily  . Joint pain   . Joint  swelling   . Nocturia   . Pneumonia    several times with most recent within 5 yrs ago  . Restless leg syndrome   . Rupture of left quadriceps tendon   . Shortness of breath dyspnea    with exertion  . Sleep apnea    does not use cpap  . Urinary urgency     Social History Social History  Substance Use Topics  . Smoking status: Current Some Day Smoker    Years: 45.00    Types: Cigars    Last attempt to quit: 08/16/2016  . Smokeless tobacco: Never Used     Comment: smokes 1-2 cigars/ week  . Alcohol use 0.0 oz/week     Comment: wine occasionally    Family History Family History  Problem Relation Age of Onset  . Parkinson's disease Mother   . AAA (abdominal aortic aneurysm) Father   . Heart defect Sister        Congenital Heart Disease  . Heart disease Sister     Surgical History Past Surgical History:  Procedure Laterality Date  . BICEPS TENDON REPAIR     LEFT  . CARPAL TUNNEL RELEASE Right 2014  . CHOLECYSTECTOMY  July 16,2010  . COLONOSCOPY    . CYSTOSCOPY    . ESOPHAGOGASTRODUODENOSCOPY    . FEMORAL-POPLITEAL BYPASS GRAFT Right 09/19/2015   Procedure: RIGHT COMMON FEMORAL-ABOVE KNEE POPLITEAL ARTERY    BYPASS GRAFT ;  Surgeon: Conrad [place], MD;  Location: [place];  Service: Vascular;  Laterality: Right;  . FOOT SURGERY Right    x 5t  . HAND SURGERY Right   . HERNIA REPAIR Bilateral    inguinal  . PERIPHERAL VASCULAR CATHETERIZATION N/A 08/02/2015   Procedure: Abdominal Aortogram w/Lower Extremity;  Surgeon: Conrad Slater, MD;  Location: [facility];  Service: Cardiovascular;  Laterality: N/A;  . QUADRICEPS TENDON REPAIR Left 08/22/2016   Procedure: LEFT OPEN QUADRICEP TENDON REPAIR;  Surgeon: Netta Cedars, MD;  Location: [place];  Service: Orthopedics;  Laterality: Left;  . REVERSE SHOULDER ARTHROPLASTY Right 07/07/2014   Procedure: RIGHT REVERSE TOTAL SHOULDER ;  Surgeon: Netta Cedars, MD;  Location: [facility];  Service: Orthopedics;  Laterality: Right;  .  REVERSE TOTAL SHOULDER ARTHROPLASTY Right 07/07/2014   Dr Veverly Fells  . ROTATOR CUFF REPAIR     Bilateral   . TONSILLECTOMY    . tumor removed from bladder    . VEIN HARVEST Right 09/19/2015   Procedure: WITH NON REVERSE RIGHT GREATER SAPHENOUS VEIN HARVEST;  Surgeon: Conrad [place], MD;  Location: [facility];  Service: Vascular;  Laterality: Right;    Allergies  Allergen Reactions  . Aspartame And Phenylalanine Nausea Only  . Other Rash  Pt reports rash on back, buttocks, shoulders and stomach after arteriogram  . Sulfonamide Derivatives Nausea And Vomiting    Current Outpatient Prescriptions  Medication Sig Dispense Refill  . albuterol (PROVENTIL HFA;VENTOLIN HFA) 108 (90 BASE) MCG/ACT inhaler Inhale 1-2 puffs into the lungs every 6 (six) hours as needed for wheezing or shortness of breath. Reported on 10/03/2015    . aspirin EC 81 MG tablet Take 1 tablet (81 mg total) by mouth 2 (two) times daily. 60 tablet 0  . budesonide-formoterol (SYMBICORT) 160-4.5 MCG/ACT inhaler Inhale 2 puffs into the lungs 2 (two) times daily. 1 Inhaler 11  . diphenhydrAMINE (BENADRYL) 25 MG tablet Take 25 mg by mouth every 6 (six) hours as needed.    . irbesartan (AVAPRO) 150 MG tablet Take 1 tablet (150 mg total) by mouth daily. 30 tablet 11  . methocarbamol (ROBAXIN) 500 MG tablet Take 1 tablet (500 mg total) by mouth 3 (three) times daily as needed. 60 tablet 1  . Tamsulosin HCl (FLOMAX) 0.4 MG CAPS Take 0.4 mg by mouth 2 (two) times daily.     . Tiotropium Bromide Monohydrate (SPIRIVA RESPIMAT) 2.5 MCG/ACT AERS Inhale 2 puffs into the lungs daily. 1 Inhaler 11   No current facility-administered medications for this visit.      REVIEW OF SYSTEMS: See HPI for pertinent positives and negatives.  Physical Examination Vitals:   01/07/17 1150 01/07/17 1153  BP: (!) 145/90 (!) 152/90  Pulse: (!) 57   Resp: 20   Temp: (!) 97.1 F (36.2 C)   TempSrc: Oral   SpO2: 97%   Weight: 198 lb 3.2 oz (89.9 kg)    Height: 5\' 11"  (1.803 m)    Body mass index is 27.64 kg/m.   General:  WDWN in NAD Gait: Normal HENT: WNL Eyes: Pupils equal Pulmonary: Slightly labored breathing, minimal air movement, + rales, rhonchi, and wheezing in all posterior fields.  Cardiac: RRR, no murmur detected  Abdomen: soft, NT, no masses palpated Skin: no rashes, no ulcers, no cellulitis.   VASCULAR EXAM  Carotid Bruits Right Left   Negative Negative      Radial pulses are 2+ palpable bilaterally   Adominal aortic pulse is not palpable                      VASCULAR EXAM: Extremities without ischemic changes, without Gangrene; without open wounds. Well healed surgical scar at base of right great toe.                                                                                                           LE Pulses Right Left       FEMORAL  2+ palpable  2+ palpable        POPLITEAL  not palpable   not palpable       POSTERIOR TIBIAL  3+ palpable   not palpable        DORSALIS PEDIS      ANTERIOR TIBIAL 2+ palpable  not palpable  Musculoskeletal: no muscle wasting or atrophy; no peripheral edema   Neurologic:  A&O X 3; appropriate affect, sensation is normal; speech is normal, CN 2-12 intact, pain and light touch intact in extremities, motor exam as listed above.      ASSESSMENT:  BONIFACIO PRUDEN is a 76 y.o. male who is s/p Right common femoral artery to above-the-knee popliteal artery bypass with ipsilateral non-reversed greater saphenous vein on 09-19-15 by Dr. Bridgett Larsson for critical limb ischemia and ulcers of right foot.  Right foot ulcers healed about a week after the surgery. Pt denies any claudication sx's with walking. There are no signs of ischemia in his feet or legs.  He had a left knee injury in May of 2018, left knee surgery in June of 2018, his activity does not seem limited.  We are also monitoring what has been a small asymptomatic AAA so far.   Pt states he is scheduled to see  his PCP tomorrow, and will let him know about his shortness of breath. On auscultation of his lungs today, there are rales, rhonchi, and wheezing in all posterior fields with limited air movement.   DATA  Right LE Arterial Duplex (10/17/180: Right LE bypass graft with no significant stenosis. Increased velocities and turbulence in the native inflow artery with minimal disease (240 cm/s).  Tri and biphasic waveforms.  No significant change since the exam on 04-25-16.   ABI (Date: 01/07/2017):  R:   ABI: 1.01 (was 0.98 on 04-25-16),   PT: tri  DP: tri  TBI:  0.57  L:   ABI: 0.55 (was 0.54),   PT: mono  DP: bi  TBI: 0.39  Stable bilateral ABI's: right is normal with triphasic waveforms, left with moderate disease and monophasic waveforms.    AAA Duplex on 04-25-16: 3.9 cm    PLAN:   The patient was counseled re smoking cessation and given several free resources re smoking cessation.  Graduated walking program discussed and how to achieve.  Based on today's exam and non-invasive vascular lab results, the patient will follow up in 6 months with the following tests: AAA duplex, right LE arterial duplex, and ABI's. . I discussed in depth with the patient the nature of atherosclerosis, and emphasized the importance of maximal medical management including strict control of blood pressure, blood glucose, and lipid levels, obtaining regular exercise, and cessation of smoking.  The patient is aware that without maximal medical management the underlying atherosclerotic disease process will progress, limiting the benefit of any interventions.  Consideration for repair of AAA would be made when the size is 5.5 cm, growth > 1 cm/yr, and symptomatic status. The patient was given information about AAA including signs, symptoms, treatment,  what symptoms should prompt the patient to seek immediate medical care, and how to minimize the risk of enlargement and rupture of aneurysms.   The  patient was given information about PAD including signs, symptoms, treatment, what symptoms should prompt the patient to seek immediate medical care, and risk reduction measures to take.  Thank you for allowing [place] to participate in this patient's care.  Clemon Chambers, RN, MSN, FNP-C [facility] Office: [phone]  Clinic MD: [place]icks[place] 01/07/2017 12:10 PM

## 2017-01-08 ENCOUNTER — Encounter: Payer: Self-pay | Admitting: Internal Medicine

## 2017-01-08 ENCOUNTER — Ambulatory Visit (INDEPENDENT_AMBULATORY_CARE_PROVIDER_SITE_OTHER): Payer: PPO | Admitting: Internal Medicine

## 2017-01-08 VITALS — BP 138/78 | HR 69 | Ht 71.0 in | Wt 198.0 lb

## 2017-01-08 DIAGNOSIS — Z23 Encounter for immunization: Secondary | ICD-10-CM

## 2017-01-08 DIAGNOSIS — J449 Chronic obstructive pulmonary disease, unspecified: Secondary | ICD-10-CM

## 2017-01-08 DIAGNOSIS — R0609 Other forms of dyspnea: Secondary | ICD-10-CM | POA: Diagnosis not present

## 2017-01-08 DIAGNOSIS — F17200 Nicotine dependence, unspecified, uncomplicated: Secondary | ICD-10-CM

## 2017-01-08 MED ORDER — GLYCOPYRROLATE-FORMOTEROL 9-4.8 MCG/ACT IN AERO: 2.0000 | INHALATION_SPRAY | Freq: Two times a day (BID) | RESPIRATORY_TRACT | 0 refills | Status: DC

## 2017-01-08 MED ORDER — GLYCOPYRROLATE-FORMOTEROL 9-4.8 MCG/ACT IN AERO: 2.0000 | INHALATION_SPRAY | Freq: Two times a day (BID) | RESPIRATORY_TRACT | 11 refills | Status: DC

## 2017-01-08 NOTE — Assessment & Plan Note (Signed)
-   PFTs  05/27/12   FEV1  2.04(62%) ratio 54  - 12/12/2015  Walked RA x 3 laps @ 185 ft each stopped due to  Leg cramps, no sob, no desats fast pace  Trial off acei 12/12/2015 > improved - 01/08/2017  Walked RA x 3 laps @ 185 ft each stopped due to  End of study, nl pace, no sob or desat   But walked with slt limp and slowed by hip, not breathing   Despite GOLD II criteria it may well be his copd is not really slowing him down so the only indication for rx might be tendency to aecopd and since failed laba/ics and lama will try lama/laba trial and if no better just use saba prn and f/u prn exac  (see copd a/p)

## 2017-01-08 NOTE — Patient Instructions (Addendum)
Plan A = Automatic = Bevespi Take 2 puffs first thing in am and then another 2 puffs about 12 hours later.   Try to be as active as your hip will allow to see if your breathing is better on this med than prior to using it  Work on inhaler technique:  relax and gently blow all the way out then take a nice smooth deep breath back in, triggering the inhaler at same time you start breathing in.  Hold for up to 5 seconds if you can. Blow out thru nose. Rinse and gargle with water when done   Plan B = Backup Only use your albuterol (Ventolin) as a rescue medication to be used if you can't catch your breath by resting or doing a relaxed purse lip breathing pattern.  - The less you use it, the better it will work when you need it. - Ok to use the inhaler up to 2 puffs  every 4 hours if you must but call for appointment if use goes up over your usual need - Don't leave home without it !!  (think of it like the spare tire for your car)     Please schedule a follow up office visit in 6 weeks, call sooner if needed with all medications /inhalers/ solutions in hand so we can verify exactly what you are taking. This includes all medications from all doctors and over the counters

## 2017-01-08 NOTE — Assessment & Plan Note (Signed)
-   PFTs  05/27/12   FEV1  2.04(62%) ratio 54  - 12/12/2015 try off spiriva since pt says not working > increased need for saba - Spirometry 01/09/2016  FEV1 1.19 (47%)  Ratio 50 p nothing prior and classic curvature on f/v loop - 01/09/2016  After extensive coaching HFA effectiveness =    75% try symbicort 160 2bid   - 12/12/2016  After extensive coaching devie effectiveness =    75% with smi > add spiriva 2 each am  - Spirometry 01/08/2017  FEV1 1.52 (48%)  Ratio 59   - 01/08/2017  After extensive coaching HFA effectiveness =    75% from a baselin of 25%  - 01/08/2017 trial of bevespi   Pt is Group B in terms of symptom/risk and laba/lama therefore appropriate rx at this point.   If not better ex tol, most likley obesity/ deconditioning and hip pain limiting > sob and can just go back to using the saba prn    I had an extended discussion with the patient reviewing all relevant studies completed to date and  lasting 15 to 20 minutes of a 25 minute visit    Each maintenance medication was reviewed in detail including most importantly the difference between maintenance and prns and under what circumstances the prns are to be triggered using an action plan format that is not reflected in the computer generated alphabetically organized AVS.    Please see AVS for specific instructions unique to this visit that I personally wrote and verbalized to the the pt in detail and then reviewed with pt  by my nurse highlighting any  changes in therapy recommended at today's visit to their plan of care.

## 2017-01-08 NOTE — Assessment & Plan Note (Signed)
Only smoking cigars but they may also contribute to CB symptoms > rec avoid completely

## 2017-01-08 NOTE — Progress Notes (Signed)
Subjective:     Patient ID: Derrick Moon, male   DOB: Jun 03, 1940   MRN: [id number]      .Brief patient profile:  75 yowm quit smoking cigs 1990 / still smoking some cigars referred to pulmonary clinic 12/12/2015 by Dr   Willey Blade for sob s/p neg cards eval by Dr Dwana Curd (though doen'st look like pt completed the myocardial perfusion study scheduled for 08/15/15) with COPD GOLD IIdx   .History of Present Illness  12/12/2015 1st [facility] office visit/ Derrick Moon   Chief Complaint  Patient presents with  . Pulmonary Consult    Referred by Dr. [name]. Pt c/o dizziness with exertion for the past 6 months. His spouse states after he has been outside working, he comes in SOB.  He has prod cough with clear sputum in the am's.   variably noisy breathing x 6 months seems to bother wife more than husband  MMRC2 = can't walk a nl pace on a flat grade s sob but does fine slow and flat eg grocery shopping  Sleeping fine off cpap but wakes up every am with throat congestion/ uses halls  spiriva not helping / hfa may help/ prednisone does not help rec avapro  150 mg one daily - if light headed when you stand up break it in half  Stop spiriva and lisinopril and the halls for now GERD diet Only use your albuterol as a rescue medication     01/09/2016  f/u ov/Derrick Moon re:  GOLD II, no maint rx/using saba bid most days / still smoking cigars  Chief Complaint  Patient presents with  . Shortness of Breath    Breathing is improved since last OV. Reports slight SOB and chest congestion. Denies  chest tightness, wheezing or coughing.   tends to needVentolin first thing am for am "congestion" Can now walk the dog s sob/ cp = MMRC2 = can't walk a nl pace on a flat grade s sob but does fine slow and flat eg shopping/ walking the dog  rec Plan A = Automatic = Symbicort 160 Take 2 puffs first thing in am and then another 2 puffs about 12 hours later.  Plan B = Backup Only use your albuterol as a rescue  medication     12/12/2016  f/u ov/Derrick Moon re: copd GOLD II/ symb 160 2bid and daily albuterol around 2 pm Chief Complaint  Patient presents with  . Follow-up    Pt states he is here for his annual followup.  He states his breathing is "pretty good".  He states he was seen by his PCP on 12/03/16 and was dxed with lung infection- txed with pred and doxy.  He has some cough with large amounts of clear sputum.  He is using his albuterol inhaler 1 x daily on average.   worse for several months cough/ congestion esp after supper, does ok at hs/noct  But wakes up most  ams with  One tbsp thick mucus while on symbicort rx pred/doxy > 50% improved / still smoking mostly cigars  Much better breathing  indoors vs outdoors but still mmrc = 2  Needing benadryl to sleep due to sense of pnds   Needing sab at least once daily  rec Plan A = Automatic = symbicort 160 x2 puffs followed by spiriva 2 puffs first thing in am then 12 hours later 2 pffs symbicort only Work on inhaler technique:  relax and gently blow all the way out then take a nice smooth  deep breath back in, triggering the inhaler at same time you start breathing in.  Hold for up to 5 seconds if you can. Blow out thru nose. Rinse and gargle with water when done Plan B = Backup Only use your albuterol as a rescue medication Please remember to go to the  x-ray department downstairs in the basement  for your tests - we will call you with the results when they are available. The key is to stop smoking completely before smoking completely stops you!     01/08/2017  f/u ov/Derrick Moon re:  GOLD II copd on just prn saba  Chief Complaint  Patient presents with  . Follow-up    Breathing has improved some.  He stopped Symbicort due to "too much congestion" and since he stopped med he feels better. He is using his albuterol inhaler once daily on average.   took ventolin x one puff x 3.5 hours prior to OV   Sleeps poorly due to shoulder pain / no am cough  /congestion  No better on symb/spiriva so stopped both  - says can't walk across parking lot s stopping due to sob x one year, no better on symb or spiriva or combo of the two but ventolin really helps (note hfa quite poor)  Doe = MMRC3 = can't walk 100 yards even at a slow pace at a flat grade s stopping due to sob    No obvious day to day or daytime variability or assoc excess/ purulent sputum or mucus plugs or hemoptysis or cp or chest tightness, subjective wheeze or overt sinus or hb symptoms. No unusual exp hx or h/o childhood pna/ asthma or knowledge of premature birth.  Sleeping ok flat without nocturnal  or early am exacerbation  of respiratory  c/o's or need for noct saba. Also denies any obvious fluctuation of symptoms with weather or environmental changes or other aggravating or alleviating factors except as outlined above   Current Allergies, Complete Past Medical History, Past Surgical History, Family History, and Social History were reviewed in [employer] record.  ROS  The following are not active complaints unless bolded Hoarseness, sore throat, dysphagia, dental problems, itching, sneezing,  nasal congestion or discharge of excess mucus or purulent secretions, ear ache,   fever, chills, sweats, unintended wt loss or wt gain, classically pleuritic or exertional cp,  orthopnea pnd or leg swelling, presyncope, palpitations, abdominal pain, anorexia, nausea, vomiting, diarrhea  or change in bowel habits or change in bladder habits, change in stools or change in urine, dysuria, hematuria,  rash, arthralgias, visual complaints, headache, numbness, weakness or ataxia or problems with walking or coordination,  change in mood/affect or memory.        Current Meds  Medication Sig  . albuterol (PROVENTIL HFA;VENTOLIN HFA) 108 (90 BASE) MCG/ACT inhaler Inhale 1-2 puffs into the lungs every 6 (six) hours as needed for wheezing or shortness of breath. Reported on 10/03/2015   . aspirin EC 81 MG tablet Take 1 tablet (81 mg total) by mouth 2 (two) times daily.  . diphenhydrAMINE (BENADRYL) 25 MG tablet Take 25 mg by mouth every 6 (six) hours as needed.  . irbesartan (AVAPRO) 150 MG tablet Take 1 tablet (150 mg total) by mouth daily.  . methocarbamol (ROBAXIN) 500 MG tablet Take 1 tablet (500 mg total) by mouth 3 (three) times daily as needed.  . Tamsulosin HCl (FLOMAX) 0.4 MG CAPS Take 0.4 mg by mouth 2 (two) times daily.   [name]  Objective:   Physical Exam  amb wm nad extremely evasive historian  12/12/2016        201  01/09/2016      198   12/12/15 198 lb (89.8 kg)  11/09/15 201 lb 12.8 oz (91.5 kg)  10/03/15 199 lb (90.3 kg)    Vital signs reviewed -   - Note on arrival 02 sats  94% on RA and bp 138/78       HEENT: poor  dentition, nl turbinates, and oropharynx. Nl external ear canals without cough reflex   NECK :  without JVD/Nodes/TM/ nl carotid upstrokes bilaterally   LUNGS: no acc muscle use,  Nl contour chest / clear to A and P   CV:  RRR  no s3 or murmur or increase in P2, no edema   ABD:  soft and nontender with nl inspiratory excursion in the supine position. No bruits or organomegaly, bowel sounds nl  MS:  Nl gait/ ext warm without deformities, calf tenderness, cyanosis or clubbing No obvious joint restrictions   SKIN: warm and dry without lesions    NEURO:  alert, approp, nl sensorium with  no motor deficits     CXR PA and Lateral:   12/12/2016 :    I personally reviewed images and agree with radiology impression as follows:   No acute abnormality. Stable changes of COPD and chronic bronchitis.     Assessment:

## 2017-01-20 DIAGNOSIS — I739 Peripheral vascular disease, unspecified: Secondary | ICD-10-CM | POA: Diagnosis not present

## 2017-01-20 DIAGNOSIS — L851 Acquired keratosis [keratoderma] palmaris et plantaris: Secondary | ICD-10-CM | POA: Diagnosis not present

## 2017-01-20 DIAGNOSIS — B351 Tinea unguium: Secondary | ICD-10-CM | POA: Diagnosis not present

## 2017-01-22 ENCOUNTER — Telehealth: Payer: Self-pay | Admitting: Internal Medicine

## 2017-01-22 NOTE — Telephone Encounter (Signed)
Received a PA request for [employer]. Started on [email or web address]. Key is [id number]. Determination will take up to 5 business days.   Will check back on status.

## 2017-01-26 NOTE — Addendum Note (Signed)
Addended by: Lianne Cure A on: 01/26/2017 04:41 PM   Modules accepted: Orders

## 2017-02-02 ENCOUNTER — Ambulatory Visit: Payer: PPO | Admitting: Internal Medicine

## 2017-02-02 ENCOUNTER — Inpatient Hospital Stay (HOSPITAL_COMMUNITY)
Admission: EM | Admit: 2017-02-02 | Discharge: 2017-02-08 | DRG: 871 | Disposition: A | Discharge status: Home / Self Care | Payer: PPO | Attending: Internal Medicine | Admitting: Internal Medicine

## 2017-02-02 ENCOUNTER — Encounter: Payer: Self-pay | Admitting: Internal Medicine

## 2017-02-02 ENCOUNTER — Emergency Department (HOSPITAL_COMMUNITY): Payer: PPO

## 2017-02-02 ENCOUNTER — Other Ambulatory Visit: Payer: Self-pay

## 2017-02-02 ENCOUNTER — Encounter (HOSPITAL_COMMUNITY): Payer: Self-pay | Admitting: Emergency Medicine

## 2017-02-02 VITALS — BP 118/64 | HR 80 | Temp 98.4°F | Ht 71.0 in | Wt 189.4 lb

## 2017-02-02 DIAGNOSIS — F17290 Nicotine dependence, other tobacco product, uncomplicated: Secondary | ICD-10-CM | POA: Diagnosis not present

## 2017-02-02 DIAGNOSIS — G2581 Restless legs syndrome: Secondary | ICD-10-CM | POA: Diagnosis present

## 2017-02-02 DIAGNOSIS — Z79899 Other long term (current) drug therapy: Secondary | ICD-10-CM | POA: Diagnosis not present

## 2017-02-02 DIAGNOSIS — I340 Nonrheumatic mitral (valve) insufficiency: Secondary | ICD-10-CM | POA: Diagnosis not present

## 2017-02-02 DIAGNOSIS — K219 Gastro-esophageal reflux disease without esophagitis: Secondary | ICD-10-CM | POA: Diagnosis present

## 2017-02-02 DIAGNOSIS — I10 Essential (primary) hypertension: Secondary | ICD-10-CM | POA: Diagnosis not present

## 2017-02-02 DIAGNOSIS — R6510 Systemic inflammatory response syndrome (SIRS) of non-infectious origin without acute organ dysfunction: Secondary | ICD-10-CM

## 2017-02-02 DIAGNOSIS — J189 Pneumonia, unspecified organism: Secondary | ICD-10-CM

## 2017-02-02 DIAGNOSIS — J9621 Acute and chronic respiratory failure with hypoxia: Secondary | ICD-10-CM | POA: Diagnosis not present

## 2017-02-02 DIAGNOSIS — R6520 Severe sepsis without septic shock: Secondary | ICD-10-CM | POA: Diagnosis not present

## 2017-02-02 DIAGNOSIS — A419 Sepsis, unspecified organism: Principal | ICD-10-CM | POA: Diagnosis present

## 2017-02-02 DIAGNOSIS — J988 Other specified respiratory disorders: Secondary | ICD-10-CM | POA: Diagnosis present

## 2017-02-02 DIAGNOSIS — G4733 Obstructive sleep apnea (adult) (pediatric): Secondary | ICD-10-CM | POA: Diagnosis present

## 2017-02-02 DIAGNOSIS — Z9049 Acquired absence of other specified parts of digestive tract: Secondary | ICD-10-CM

## 2017-02-02 DIAGNOSIS — Z96611 Presence of right artificial shoulder joint: Secondary | ICD-10-CM | POA: Diagnosis present

## 2017-02-02 DIAGNOSIS — Z7901 Long term (current) use of anticoagulants: Secondary | ICD-10-CM | POA: Diagnosis not present

## 2017-02-02 DIAGNOSIS — J441 Chronic obstructive pulmonary disease with (acute) exacerbation: Secondary | ICD-10-CM | POA: Insufficient documentation

## 2017-02-02 DIAGNOSIS — J9601 Acute respiratory failure with hypoxia: Secondary | ICD-10-CM | POA: Insufficient documentation

## 2017-02-02 DIAGNOSIS — Z7982 Long term (current) use of aspirin: Secondary | ICD-10-CM | POA: Diagnosis not present

## 2017-02-02 DIAGNOSIS — N179 Acute kidney failure, unspecified: Secondary | ICD-10-CM | POA: Diagnosis not present

## 2017-02-02 DIAGNOSIS — I4891 Unspecified atrial fibrillation: Secondary | ICD-10-CM

## 2017-02-02 DIAGNOSIS — I739 Peripheral vascular disease, unspecified: Secondary | ICD-10-CM | POA: Diagnosis not present

## 2017-02-02 DIAGNOSIS — I480 Paroxysmal atrial fibrillation: Secondary | ICD-10-CM | POA: Diagnosis present

## 2017-02-02 DIAGNOSIS — N401 Enlarged prostate with lower urinary tract symptoms: Secondary | ICD-10-CM | POA: Diagnosis present

## 2017-02-02 DIAGNOSIS — R0609 Other forms of dyspnea: Secondary | ICD-10-CM

## 2017-02-02 DIAGNOSIS — Z8249 Family history of ischemic heart disease and other diseases of the circulatory system: Secondary | ICD-10-CM | POA: Diagnosis not present

## 2017-02-02 DIAGNOSIS — T380X5A Adverse effect of glucocorticoids and synthetic analogues, initial encounter: Secondary | ICD-10-CM | POA: Diagnosis present

## 2017-02-02 DIAGNOSIS — F17200 Nicotine dependence, unspecified, uncomplicated: Secondary | ICD-10-CM | POA: Diagnosis not present

## 2017-02-02 DIAGNOSIS — I714 Abdominal aortic aneurysm, without rupture: Secondary | ICD-10-CM | POA: Diagnosis not present

## 2017-02-02 DIAGNOSIS — I471 Supraventricular tachycardia: Secondary | ICD-10-CM | POA: Diagnosis not present

## 2017-02-02 DIAGNOSIS — Z7951 Long term (current) use of inhaled steroids: Secondary | ICD-10-CM

## 2017-02-02 DIAGNOSIS — E0590 Thyrotoxicosis, unspecified without thyrotoxic crisis or storm: Secondary | ICD-10-CM | POA: Diagnosis not present

## 2017-02-02 DIAGNOSIS — F17210 Nicotine dependence, cigarettes, uncomplicated: Secondary | ICD-10-CM | POA: Diagnosis present

## 2017-02-02 DIAGNOSIS — D729 Disorder of white blood cells, unspecified: Secondary | ICD-10-CM | POA: Diagnosis not present

## 2017-02-02 DIAGNOSIS — R0902 Hypoxemia: Secondary | ICD-10-CM | POA: Diagnosis not present

## 2017-02-02 DIAGNOSIS — J9620 Acute and chronic respiratory failure, unspecified whether with hypoxia or hypercapnia: Secondary | ICD-10-CM

## 2017-02-02 DIAGNOSIS — Z8551 Personal history of malignant neoplasm of bladder: Secondary | ICD-10-CM | POA: Diagnosis not present

## 2017-02-02 DIAGNOSIS — I700 Atherosclerosis of aorta: Secondary | ICD-10-CM | POA: Diagnosis not present

## 2017-02-02 DIAGNOSIS — R0602 Shortness of breath: Secondary | ICD-10-CM | POA: Diagnosis not present

## 2017-02-02 HISTORY — DX: Peripheral vascular disease, unspecified: I73.9

## 2017-02-02 LAB — I-STAT CG4 LACTIC ACID, ED
Lactic Acid, Venous: 2.56 mmol/L (ref 0.5–1.9)
Lactic Acid, Venous: 1.65 mmol/L (ref 0.5–1.9)

## 2017-02-02 LAB — BASIC METABOLIC PANEL
ANION GAP: 11 (ref 5–15)
BUN: 20 mg/dL (ref 6–20)
CALCIUM: 9.3 mg/dL (ref 8.9–10.3)
CO2: 25 mmol/L (ref 22–32)
Chloride: 103 mmol/L (ref 101–111)
Creatinine, Ser: 1.47 mg/dL — ABNORMAL HIGH (ref 0.61–1.24)
GFR calc non Af Amer: 45 mL/min — ABNORMAL LOW (ref >60)
GFR, EST AFRICAN AMERICAN: 52 mL/min — AB (ref >60)
GLUCOSE: 146 mg/dL — AB (ref 65–99)
POTASSIUM: 3.7 mmol/L (ref 3.5–5.1)
Sodium: 139 mmol/L (ref 135–145)

## 2017-02-02 LAB — CBC
HEMATOCRIT: 45.9 % (ref 39.0–52.0)
HEMOGLOBIN: 14.9 g/dL (ref 13.0–17.0)
MCH: 29.8 pg (ref 26.0–34.0)
MCHC: 32.5 g/dL (ref 30.0–36.0)
MCV: 91.8 fL (ref 78.0–100.0)
Platelets: 309 10*3/uL (ref 150–400)
RBC: 5 MIL/uL (ref 4.22–5.81)
RDW: 15.8 % — AB (ref 11.5–15.5)
WBC: 24.7 10*3/uL — AB (ref 4.0–10.5)

## 2017-02-02 LAB — PROCALCITONIN: Procalcitonin: 1.62 ng/mL

## 2017-02-02 LAB — I-STAT TROPONIN, ED: Troponin i, poc: 0.06 ng/mL (ref 0.00–0.08)

## 2017-02-02 LAB — HEPARIN LEVEL (UNFRACTIONATED): Heparin Unfractionated: <0.1 IU/mL — ABNORMAL LOW (ref 0.30–0.70)

## 2017-02-02 MED ORDER — IPRATROPIUM-ALBUTEROL 0.5-2.5 (3) MG/3ML IN SOLN
3.0000 mL | Freq: Once | RESPIRATORY_TRACT | Status: AC
  Administered 2017-02-02: 3 mL via RESPIRATORY_TRACT
  Filled 2017-02-02: qty 3

## 2017-02-02 MED ORDER — ENSURE ENLIVE PO LIQD
237.0000 mL | Freq: Two times a day (BID) | ORAL | Status: DC
  Administered 2017-02-03: 237 mL via ORAL

## 2017-02-02 MED ORDER — LEVOFLOXACIN IN D5W 750 MG/150ML IV SOLN
750.0000 mg | Freq: Once | INTRAVENOUS | Status: AC
  Administered 2017-02-02: 750 mg via INTRAVENOUS
  Filled 2017-02-02: qty 150

## 2017-02-02 MED ORDER — TIOTROPIUM BROMIDE MONOHYDRATE 2.5 MCG/ACT IN AERS: 2.0000 | INHALATION_SPRAY | Freq: Every day | RESPIRATORY_TRACT | Status: DC

## 2017-02-02 MED ORDER — SODIUM CHLORIDE 0.9 % IV BOLUS (SEPSIS)
1000.0000 mL | Freq: Once | INTRAVENOUS | Status: AC
  Administered 2017-02-02: 1000 mL via INTRAVENOUS

## 2017-02-02 MED ORDER — LEVOFLOXACIN IN D5W 500 MG/100ML IV SOLN
500.0000 mg | INTRAVENOUS | Status: DC
  Administered 2017-02-03 – 2017-02-05 (×3): 500 mg via INTRAVENOUS
  Filled 2017-02-02 (×4): qty 100

## 2017-02-02 MED ORDER — MAGNESIUM SULFATE 2 GM/50ML IV SOLN
2.0000 g | Freq: Once | INTRAVENOUS | Status: AC
  Administered 2017-02-02: 2 g via INTRAVENOUS
  Filled 2017-02-02: qty 50

## 2017-02-02 MED ORDER — ACETAMINOPHEN 650 MG RE SUPP: 650.0000 mg | Freq: Four times a day (QID) | RECTAL | Status: DC | PRN

## 2017-02-02 MED ORDER — HEPARIN (PORCINE) IN NACL 100-0.45 UNIT/ML-% IJ SOLN
1800.0000 [IU]/h | INTRAMUSCULAR | Status: DC
  Administered 2017-02-02: 1350 [IU]/h via INTRAVENOUS
  Administered 2017-02-03: 1600 [IU]/h via INTRAVENOUS
  Filled 2017-02-02 (×3): qty 250

## 2017-02-02 MED ORDER — IOPAMIDOL (ISOVUE-370) INJECTION 76%
INTRAVENOUS | Status: AC
  Administered 2017-02-02: 100 mL
  Filled 2017-02-02: qty 100

## 2017-02-02 MED ORDER — DILTIAZEM HCL 100 MG IV SOLR
5.0000 mg/h | INTRAVENOUS | Status: DC
  Administered 2017-02-02: 5 mg/h via INTRAVENOUS
  Administered 2017-02-03: 15 mg/h via INTRAVENOUS
  Administered 2017-02-03 (×2): 20 mg/h via INTRAVENOUS
  Administered 2017-02-03: 15 mg/h via INTRAVENOUS
  Administered 2017-02-04 – 2017-02-06 (×10): 20 mg/h via INTRAVENOUS
  Filled 2017-02-02 (×17): qty 100

## 2017-02-02 MED ORDER — HEPARIN BOLUS VIA INFUSION
5000.0000 [IU] | Freq: Once | INTRAVENOUS | Status: AC
  Administered 2017-02-02: 5000 [IU] via INTRAVENOUS
  Filled 2017-02-02: qty 5000

## 2017-02-02 MED ORDER — ONDANSETRON HCL 4 MG/2ML IJ SOLN: 4.0000 mg | Freq: Four times a day (QID) | INTRAMUSCULAR | Status: DC | PRN

## 2017-02-02 MED ORDER — ACETAMINOPHEN 325 MG PO TABS
650.0000 mg | ORAL_TABLET | Freq: Four times a day (QID) | ORAL | Status: DC | PRN
  Administered 2017-02-02 – 2017-02-08 (×11): 650 mg via ORAL
  Filled 2017-02-02 (×12): qty 2

## 2017-02-02 MED ORDER — DILTIAZEM LOAD VIA INFUSION
20.0000 mg | Freq: Once | INTRAVENOUS | Status: AC
  Administered 2017-02-02: 20 mg via INTRAVENOUS
  Filled 2017-02-02: qty 20

## 2017-02-02 MED ORDER — SODIUM CHLORIDE 0.9 % IV SOLN
INTRAVENOUS | Status: DC
  Administered 2017-02-02 – 2017-02-05 (×6): via INTRAVENOUS

## 2017-02-02 MED ORDER — IPRATROPIUM-ALBUTEROL 0.5-2.5 (3) MG/3ML IN SOLN
3.0000 mL | Freq: Four times a day (QID) | RESPIRATORY_TRACT | Status: DC
  Administered 2017-02-02 – 2017-02-03 (×2): 3 mL via RESPIRATORY_TRACT
  Filled 2017-02-02 (×2): qty 3

## 2017-02-02 MED ORDER — METHYLPREDNISOLONE SODIUM SUCC 125 MG IJ SOLR
125.0000 mg | Freq: Once | INTRAMUSCULAR | Status: AC
  Administered 2017-02-02: 125 mg via INTRAVENOUS
  Filled 2017-02-02: qty 2

## 2017-02-02 MED ORDER — HEPARIN BOLUS VIA INFUSION
2000.0000 [IU] | Freq: Once | INTRAVENOUS | Status: AC
  Administered 2017-02-02: 2000 [IU] via INTRAVENOUS
  Filled 2017-02-02: qty 2000

## 2017-02-02 MED ORDER — ONDANSETRON HCL 4 MG PO TABS: 4.0000 mg | ORAL_TABLET | Freq: Four times a day (QID) | ORAL | Status: DC | PRN

## 2017-02-02 MED ORDER — ASPIRIN EC 81 MG PO TBEC
81.0000 mg | DELAYED_RELEASE_TABLET | Freq: Two times a day (BID) | ORAL | Status: DC
  Administered 2017-02-02 – 2017-02-06 (×8): 81 mg via ORAL
  Filled 2017-02-02 (×10): qty 1

## 2017-02-02 MED ORDER — TAMSULOSIN HCL 0.4 MG PO CAPS
0.4000 mg | ORAL_CAPSULE | Freq: Two times a day (BID) | ORAL | Status: DC
Start: 2017-02-02 — End: 2017-02-08
  Administered 2017-02-02 – 2017-02-08 (×12): 0.4 mg via ORAL
  Filled 2017-02-02 (×15): qty 1

## 2017-02-02 MED ORDER — IRBESARTAN 150 MG PO TABS
150.0000 mg | ORAL_TABLET | Freq: Every day | ORAL | Status: DC
  Administered 2017-02-02 – 2017-02-08 (×7): 150 mg via ORAL
  Filled 2017-02-02 (×8): qty 1

## 2017-02-02 MED ORDER — METHYLPREDNISOLONE SODIUM SUCC 125 MG IJ SOLR
125.0000 mg | Freq: Three times a day (TID) | INTRAMUSCULAR | Status: DC
  Administered 2017-02-02 – 2017-02-06 (×11): 125 mg via INTRAVENOUS
  Filled 2017-02-02 (×11): qty 2

## 2017-02-02 NOTE — Consult Note (Signed)
Cardiology Consultation:   Patient ID: Derrick Moon; [id number]; 1940/06/19   Admit date: 02/02/2017 Date of Consult: 02/02/2017  Primary Care Provider: [name], MD Primary Cardiologist: Dr Bronson Ing, 08/13/2015 Primary Electrophysiologist:  n/a   Patient Profile:   Derrick Moon is a 76 y.o. male with a hx of HTN, COPD, AAA, PAD, who is being seen today for the evaluation of possible atrial fibrillation at the request of Dr. Tyrone Nine.  History of Present Illness:   Mr. Brodersen was seen by Dr. Bronson Ing 07/2015 preop for vascular surgery (R-CFA>popliteal).  He had some possible ischemia, but his EF was normal and it was a low risk study.  He has no history of palpitations or arrhythmia.  Today, Dr. Melvyn Novas saw Mr. Vogelsang for shortness of breath.  He has a productive cough.  His ECG was concerning for a heart rate of 140, possible atrial fibrillation.  Because of the atrial fib and the COPD exacerbation, he was sent to the emergency room for admission.  Mr Vasek had a bronchial infection a couple of months ago, treated by Dr Willey Blade. He improved some, but wonders if he ever got over it. His O2 sat runs from 98% (rare) down to 88%. Generally he stays about 90%. For the last 4-5 days, it has been running lower. His wife was not sure he was going to make it till today.   For about a week, he has been noticing an elevated HR on his home pulse-ox. His normal heart rate is in the 80s, but has been over 100 for about a week. He is not otherwise aware of it.   He has been weak, has to hold on in order to walk through the house. He has not lost consciousness, but has been light-headed when getting up off a low chair or stool at times. He has been eating and drinking poorly recently. He has had no bleeding issues.  He feels that he has not come back completely from the fem-pop and then had L quad tendon rupture>surgery 08/2016, then got URI 11/2016 and the illness now. He feels that his health  is going downhill because he cannot seem to recover completely from the procedures/illnesses.   Past Medical History:  Diagnosis Date  . AAA (abdominal aortic aneurysm) ([facility])   . Anxiety    due to pain in leg  . Arthritis    Osteo  . Bell's palsy   . Cancer [facility])    Bladder cancer  . Cataracts, bilateral   . COPD (chronic obstructive pulmonary disease) (HCC)    Albuterol inhaler as needed  . Diverticulitis   . Enlarged prostate    takes Flomax daily  . GERD (gastroesophageal reflux disease)    was on Prilosec but has been off for yrs-no issues now with reflux  . Headache    migraines years ago  . History of bronchitis    > 76yrs ago  . History of colon polyps    benign  . History of dizziness    several yrs ago-was on Meclizine but has been off for several yrs-no problems since  . History of hiatal hernia   . Hypertension    takes Amlodipine and Lisinpril daily  . Joint pain   . Joint swelling   . Nocturia   . PAD (peripheral artery disease) (HCC)     bilateral superficial femoral artery occlusions   . Pneumonia    several times with most recent within 5 yrs ago  .  Restless leg syndrome   . Rupture of left quadriceps tendon   . Shortness of breath dyspnea    with exertion  . Sleep apnea    does not use cpap  . Urinary urgency     Past Surgical History:  Procedure Laterality Date  . BICEPS TENDON REPAIR     LEFT  . CARPAL TUNNEL RELEASE Right 2014  . CHOLECYSTECTOMY  July 16,2010  . COLONOSCOPY    . CYSTOSCOPY    . ESOPHAGOGASTRODUODENOSCOPY    . FOOT SURGERY Right    x 5t  . HAND SURGERY Right   . HERNIA REPAIR Bilateral    inguinal  . REVERSE TOTAL SHOULDER ARTHROPLASTY Right 07/07/2014   Dr Veverly Fells  . ROTATOR CUFF REPAIR     Bilateral   . TONSILLECTOMY    . tumor removed from bladder       Inpatient Medications: Scheduled Meds:  Continuous Infusions: . diltiazem (CARDIZEM) infusion 10 mg/hr (02/02/17 1611)  . heparin 1,350 Units/hr (02/02/17  1353)   PRN Meds:  Prior to Admission medications   Medication Sig Start Date End Date Taking? Authorizing Provider  albuterol (PROVENTIL HFA;VENTOLIN HFA) 108 (90 BASE) MCG/ACT inhaler Inhale 1-2 puffs into the lungs every 6 (six) hours as needed for wheezing or shortness of breath. Reported on 10/03/2015    [provider]  aspirin EC 81 MG tablet Take 1 tablet (81 mg total) by mouth 2 (two) times daily. 08/22/16   Netta Cedars, MD  diphenhydrAMINE (BENADRYL) 25 MG tablet Take 25 mg by mouth every 6 (six) hours as needed.    [provider]  Glycopyrrolate-Formoterol (BEVESPI AEROSPHERE) 9-4.8 MCG/ACT AERO Inhale 2 puffs into the lungs 2 (two) times daily. 01/08/17   Tanda Rockers, MD  irbesartan (AVAPRO) 150 MG tablet Take 1 tablet (150 mg total) by mouth daily. 12/12/16   Tanda Rockers, MD  methocarbamol (ROBAXIN) 500 MG tablet Take 1 tablet (500 mg total) by mouth 3 (three) times daily as needed. 08/22/16   Netta Cedars, MD  Tamsulosin HCl (FLOMAX) 0.4 MG CAPS Take 0.4 mg by mouth 2 (two) times daily.     [provider]  Tiotropium Bromide Monohydrate (SPIRIVA RESPIMAT) 2.5 MCG/ACT AERS Inhale 2 puffs into the lungs daily. 12/12/16   Tanda Rockers, MD    Allergies:    Allergies  Allergen Reactions  . Aspartame And Phenylalanine Nausea Only  . Other Rash    Pt reports rash on back, buttocks, shoulders and stomach after arteriogram  . Sulfonamide Derivatives Nausea And Vomiting    Social History:   Social History   Socioeconomic History  . Marital status: Married    Spouse name: Not on file  . Number of children: Not on file  . Years of education: Not on file  . Highest education level: Not on file  Social Needs  . Financial resource strain: Not on file  . Food insecurity - worry: Not on file  . Food insecurity - inability: Not on file  . Transportation needs - medical: Not on file  . Transportation needs - non-medical: Not on file  Occupational  History  . Not on file  Tobacco Use  . Smoking status: Former Smoker    Years: 45.00    Types: Cigars    Last attempt to quit: 01/23/2017    Years since quitting: 0.0  . Smokeless tobacco: Never Used  Substance and Sexual Activity  . Alcohol use: Yes    Alcohol/week:  0.0 oz    Comment: wine occasionally  . Drug use: Yes    Types: Marijuana    Comment: rare; last use 2017  . Sexual activity: Yes  Other Topics Concern  . Not on file  Social History Narrative   Pt lives w/ wife in [place], [place]    Family History:   The patient's family history includes AAA (abdominal aortic aneurysm) in his father; Heart defect in his sister; Heart disease in his sister; Parkinson's disease in his mother. Pt indicated that his mother is deceased. He indicated that his father is deceased. He indicated that the status of his sister is unknown.   ROS:  Please see the history of present illness.  All other ROS reviewed and negative.      Physical Exam/Data:   Vitals:   02/02/17 1530 02/02/17 1537 02/02/17 1545 02/02/17 1615  BP: 115/80 115/80 124/75 131/72  Pulse: (!) 110 (!) 114 (!) 105 (!) 107  Resp: (!) 36 (!) 32 (!) 33 (!) 28  Temp:      TempSrc:      SpO2: 92% 93% (!) 89% 90%  Weight:      Height:        Intake/Output Summary (Last 24 hours) at 02/02/2017 1627 Last data filed at 02/02/2017 1519 Gross per 24 hour  Intake 150 ml  Output -  Net 150 ml   Filed Weights   02/02/17 1230  Weight: 189 lb (85.7 kg)   Body mass index is 26.36 kg/m.  General:  Well nourished, well developed, in moderate respiratory distress HEENT: normal Lymph: no adenopathy Neck: no JVD Endocrine:  No thryomegaly Vascular: No carotid bruits; 3/4 extremity pulses 2+, slightly decreased R pedal pulses but are easily palpable Cardiac:  normal S1, S2; RRR; no murmur  Lungs: Not clear to auscultation bilaterally, + wheezing, + rhonchi + rales  Abd: soft, nontender, no hepatomegaly  Ext: no  edema Musculoskeletal:  No deformities, BUE and BLE strength normal and equal Skin: warm and dry  Neuro:  CNs 2-12 intact, no focal abnormalities noted Psych:  Normal affect   EKG:  The EKG was personally reviewed and demonstrates:  Atrial fib, HR 115, no acute changes Telemetry:  Telemetry was personally reviewed and demonstrates:  Atrial fib, RVR  Relevant CV Studies:  MYOVIEW: 08/15/2015  No diagnostic ST segment changes to indicate ischemia.  Small, mild intensity, partially reversible mid apical inferoseptal defect. Possibly associated with variable soft tissue attenuation, however mild region of ischemia is not excluded.  This is a low risk study.  Nuclear stress EF: 66%.  Laboratory Data:  Chemistry Recent Labs  Lab 02/02/17 1245  NA 139  K 3.7  CL 103  CO2 25  GLUCOSE 146*  BUN 20  CREATININE 1.47*  CALCIUM 9.3  GFRNONAA 45*  GFRAA 52*  ANIONGAP 11    Hematology Recent Labs  Lab 02/02/17 1245  WBC 24.7*  RBC 5.00  HGB 14.9  HCT 45.9  MCV 91.8  MCH 29.8  MCHC 32.5  RDW 15.8*  PLT 309   Cardiac Enzymes  Recent Labs  Lab 02/02/17 1246  TROPIPOC 0.06     Radiology/Studies:  Ct Angio Chest Pe W And/or Wo Contrast  Result Date: 02/02/2017 CLINICAL DATA:  76 year old male with history of shortness of breath and weakness. EXAM: CT ANGIOGRAPHY CHEST WITH CONTRAST TECHNIQUE: Multidetector CT imaging of the chest was performed using the standard protocol during bolus administration of intravenous contrast. Multiplanar CT image reconstructions  and MIPs were obtained to evaluate the vascular anatomy. CONTRAST:  117mL ISOVUE-370 IOPAMIDOL (ISOVUE-370) INJECTION 76% COMPARISON:  Chest CT 06/29/2008. FINDINGS: Cardiovascular: Study is limited by considerable respiratory motion such that subsegmental and distal segmental sized emboli cannot be completely excluded in certain portions of the lungs, most notably in the lower lobes of the lungs bilaterally. With  these limitations in mind, there is no central, lobar or proximal segmental sized filling defect to suggest clinically relevant pulmonary embolism. Heart size is borderline enlarged. There is no significant pericardial fluid, thickening or pericardial calcification. There is aortic atherosclerosis, as well as atherosclerosis of the great vessels of the mediastinum and the coronary arteries, including calcified atherosclerotic plaque in the left anterior descending and right coronary arteries. Mediastinum/Nodes: Multiple borderline enlarged and enlarged mediastinal and bilateral hilar lymph nodes are noted, with the largest of these measuring up to 1.9 cm in the right infrahilar region, and up to 1.3 cm in the low right paratracheal nodal station. Esophagus is unremarkable in appearance. No axillary lymphadenopathy. Lungs/Pleura: Diffuse bronchial wall thickening with scattered areas of more severe thickening of the peribronchovascular interstitium with some mild cylindrical bronchiectasis and extensive regions of mucoid impaction, most evident in the lower lobes of the lungs bilaterally. These areas of greatest involvement also demonstrate associated peripheral peribronchovascular septal thickening and architectural distortion which is poorly evaluated on today's motion limited examination. There is also a background of mild centrilobular and paraseptal emphysema. No pleural effusions. Upper Abdomen: Aortic atherosclerosis. Status post cholecystectomy. Diffuse low attenuation throughout the visualized hepatic parenchyma, indicative of hepatic steatosis. Diffuse low-attenuation enlargement (-2 HU) of the left adrenal gland, indicative of adenomatous hyperplasia, similar in retrospect to prior study from 2010. Musculoskeletal: There are no aggressive appearing lytic or blastic lesions noted in the visualized portions of the skeleton. Review of the MIP images confirms the above findings. IMPRESSION: 1. Despite the  limitations of today's examination there is no evidence to suggest central, lobar or proximal segmental sized pulmonary embolism. More distal segmental and subsegmental sized emboli cannot be entirely excluded, particularly in the lower lobes of the lungs bilaterally. 2. However, despite the limitations of today's examination there is a plausible explanation for the patient shortness of breath. Specifically, there is evidence of probable chronic atypical infection, as discussed above. Alternatively, findings could reflect a severe acute bronchitis with early developing bronchopneumonia most evident in the right lower lobe. Further evaluation by pulmonology is recommended in the near future. 3. Extensive mediastinal and bilateral hilar lymphadenopathy. This could be reactive in the setting of chronic atypical infection, or could be indicative of underlying lymphoproliferative disorder. Alternative diagnosis such as sarcoidosis could also be considered, but is not strongly favored at this time. 4. Aortic atherosclerosis, in addition to 2 vessel coronary artery disease. Assessment for potential risk factor modification, dietary therapy or pharmacologic therapy may be warranted, if clinically indicated. 5. Mild centrilobular and paraseptal emphysema. 6. Hepatic steatosis. 7. Additional incidental findings, as above. Aortic Atherosclerosis (ICD10-I70.0) and Emphysema (ICD10-J43.9). Electronically Signed   By: Vinnie Langton M.D.   On: 02/02/2017 14:52   Dg Chest Portable 1 View  Result Date: 02/02/2017 CLINICAL DATA:  Short of breath EXAM: PORTABLE CHEST 1 VIEW COMPARISON:  12/12/2016 FINDINGS: Interval development of patchy bibasilar airspace disease. Lungs were clear previously. COPD with hyperinflation. Negative for heart failure or effusion. Right shoulder replacement IMPRESSION: COPD.  Interval development of bibasilar atelectasis/pneumonia. Electronically Signed   By: Franchot Gallo M.D.   On: 02/02/2017  13:39    Assessment and Plan:   Principal Problem:   Acute on chronic respiratory failure (HCC) - components of COPD and PNA - Mgt per IM  Active Problems: 2.  Atrial fibrillation with RVR (HCC) - rate control w/ IV Cardizem for now - HR will be increased due to acute illness - unknown duration, but has definitely been > 48 hr, no DCCV - follow HR on Cardizem - would try to avoid AAD - ck echo   Jonetta Speak, PA-C  02/02/2017 4:27 PM   History and all data above reviewed.  Patient examined.  I agree with the findings as above.  The patient presents with acute on chronic SOB.  He is found to have exacerbation of COPD with probable pneumonia.  He is also noted to be in atrial fib.  He has noticed that his HR has been elevated with rates of 100 - 130 probably for the past week.  He has had increased dyspnea for at least two weeks.  He denies any chest pain and does not feel palpitations.  He has no prior history of atrial fib.   The patient exam reveals GLO:[id number]  ,  Lungs: Clear  ,  Abd: Positive bowel sounds, no rebound no guarding, Ext No edema  .  All available labs, radiology testing, previous records reviewed. Agree with documented assessment and plan. Atrial fib:  Unlikely to be a primary contributor to his acute SOB.  Plan to treated with IV Cardizem and heparin for now.  He will need DOAC.  Mr. ANTOINO WESTHOFF has a CHA2DS2 - VASc score of 3.      Jeneen Rinks Yovanna Cogan  6:44 PM  02/02/2017

## 2017-02-02 NOTE — ED Notes (Signed)
Pt requested pain medication due to his shoulder pain which he described was at a level 8 or 9 out of 10 on the pain scale.

## 2017-02-02 NOTE — Assessment & Plan Note (Signed)
Multifactorial, acute on chronic > to ER

## 2017-02-02 NOTE — ED Provider Notes (Signed)
[facility] EMERGENCY DEPARTMENT Provider Note   CSN: [id number] Arrival date & time: 02/02/17  1220     History   Chief Complaint Chief Complaint  Patient presents with  . Shortness of Breath  . Atrial Fibrillation    HPI Derrick Moon is a 76 y.o. male.  HPI  76 y.o. male with a hx of COPD, HTN, presents to the Emergency Department today from [place] office due to new onset Atrial fibrillation. Notes productive cough since Friday with increased WOB. Subjective fevers. No N/V/D. No CP/ABD pain. No diaphoresis. No numbness/tingling. Low O2 saturations in low 80s were noted in Pulmonologist's office. Pt does not use home O2. Placed on 4L [place] with O2 sat 94%. Pt denies pain. No other symptoms noted.   Past Medical History:  Diagnosis Date  . AAA (abdominal aortic aneurysm) ([place])   . AAA (abdominal aortic aneurysm) ([place])   . Anxiety    due to pain in leg  . Arthritis    Osteo  . Bell's palsy   . Cancer [facility])    Bladder cancer  . Cataracts, bilateral   . COPD (chronic obstructive pulmonary disease) ([facility])    Albuterol inhaler as needed  . Diverticulitis   . Enlarged prostate    takes Flomax daily  . GERD (gastroesophageal reflux disease)    was on Prilosec but has been off for yrs-no issues now with reflux  . Headache    migraines years ago  . History of bronchitis    > 51yrs ago  . History of colon polyps    benign  . History of dizziness    several yrs ago-was on Meclizine but has been off for several yrs-no problems since  . History of hiatal hernia   . Hypertension    takes Amlodipine and Lisinpril daily  . Joint pain   . Joint swelling   . Nocturia   . Pneumonia    several times with most recent within 5 yrs ago  . Restless leg syndrome   . Rupture of left quadriceps tendon   . Shortness of breath dyspnea    with exertion  . Sleep apnea    does not use cpap  . Urinary urgency     Patient Active Problem List   Diagnosis Date  Noted  . New onset a-fib ([place]) 02/02/2017  . COPD with acute exacerbation ([place]) 02/02/2017  . Acute respiratory failure with hypoxemia ([place]) 02/02/2017  . Smoking 12/14/2016  . Dyspnea 12/12/2015  . COPD GOLD II 12/12/2015  . Atherosclerosis of native arteries of right leg with ulceration of other part of foot ([place]) 09/19/2015  . Atherosclerosis of native arteries of the extremities with ulceration ([place]) 07/27/2015  . S/P shoulder replacement 07/07/2014  . Aortic ectasia, abdominal ([place]) 02/26/2012  . Essential hypertension 02/26/2009  . DIVERTICULITIS, HX OF 02/26/2009    Past Surgical History:  Procedure Laterality Date  . BICEPS TENDON REPAIR     LEFT  . CARPAL TUNNEL RELEASE Right 2014  . CHOLECYSTECTOMY  July 16,2010  . COLONOSCOPY    . CYSTOSCOPY    . ESOPHAGOGASTRODUODENOSCOPY    . FOOT SURGERY Right    x 5t  . HAND SURGERY Right   . HERNIA REPAIR Bilateral    inguinal  . REVERSE TOTAL SHOULDER ARTHROPLASTY Right 07/07/2014   Dr Veverly Fells  . ROTATOR CUFF REPAIR     Bilateral   . TONSILLECTOMY    . tumor removed from bladder  Home Medications    Prior to Admission medications   Medication Sig Start Date End Date Taking? Authorizing Provider  albuterol (PROVENTIL HFA;VENTOLIN HFA) 108 (90 BASE) MCG/ACT inhaler Inhale 1-2 puffs into the lungs every 6 (six) hours as needed for wheezing or shortness of breath. Reported on 10/03/2015    [provider]  aspirin EC 81 MG tablet Take 1 tablet (81 mg total) by mouth 2 (two) times daily. 08/22/16   Netta Cedars, MD  diphenhydrAMINE (BENADRYL) 25 MG tablet Take 25 mg by mouth every 6 (six) hours as needed.    [provider]  Glycopyrrolate-Formoterol (BEVESPI AEROSPHERE) 9-4.8 MCG/ACT AERO Inhale 2 puffs into the lungs 2 (two) times daily. 01/08/17   Tanda Rockers, MD  irbesartan (AVAPRO) 150 MG tablet Take 1 tablet (150 mg total) by mouth daily. 12/12/16   Tanda Rockers, MD  methocarbamol  (ROBAXIN) 500 MG tablet Take 1 tablet (500 mg total) by mouth 3 (three) times daily as needed. 08/22/16   Netta Cedars, MD  Tamsulosin HCl (FLOMAX) 0.4 MG CAPS Take 0.4 mg by mouth 2 (two) times daily.     [provider]  Tiotropium Bromide Monohydrate (SPIRIVA RESPIMAT) 2.5 MCG/ACT AERS Inhale 2 puffs into the lungs daily. 12/12/16   Tanda Rockers, MD    Family History Family History  Problem Relation Age of Onset  . Parkinson's disease Mother   . AAA (abdominal aortic aneurysm) Father   . Heart defect Sister        Congenital Heart Disease  . Heart disease Sister     Social History Social History   Tobacco Use  . Smoking status: Former Smoker    Years: 45.00    Types: Cigars    Last attempt to quit: 01/23/2017    Years since quitting: 0.0  . Smokeless tobacco: Never Used  Substance Use Topics  . Alcohol use: Yes    Alcohol/week: 0.0 oz    Comment: wine occasionally  . Drug use: Yes    Types: Marijuana    Comment: rare; last use 2017     Allergies   Aspartame and phenylalanine; Other; and Sulfonamide derivatives   Review of Systems Review of Systems ROS reviewed and all are negative for acute change except as noted in the HPI.  Physical Exam Updated Vital Signs BP 126/71 (BP Location: Right Arm)   Temp 98.6 F (37 C) (Oral)   Resp (!) 30   Ht 5\' 11"  (1.803 m)   Wt 85.7 kg (189 lb)   SpO2 93%   BMI 26.36 kg/m   Physical Exam  Constitutional: He is oriented to person, place, and time. He appears well-developed and well-nourished. No distress.  HENT:  Head: Normocephalic and atraumatic.  Right Ear: Tympanic membrane, external ear and ear canal normal.  Left Ear: Tympanic membrane, external ear and ear canal normal.  Nose: Nose normal.  Mouth/Throat: Uvula is midline, oropharynx is clear and moist and mucous membranes are normal. No trismus in the jaw. No oropharyngeal exudate, posterior oropharyngeal erythema or tonsillar abscesses.  Eyes: EOM are  normal. Pupils are equal, round, and reactive to light.  Neck: Normal range of motion. Neck supple. No tracheal deviation present.  Cardiovascular: Normal rate, S1 normal, S2 normal, normal heart sounds, intact distal pulses and normal pulses. An irregularly irregular rhythm present.  Pulmonary/Chest: Effort normal. Tachypnea noted. No respiratory distress. He has no decreased breath sounds. He has no wheezes. He has no rhonchi. He has no  rales.  Abdominal: Normal appearance and bowel sounds are normal. There is no tenderness.  Musculoskeletal: Normal range of motion.  Neurological: He is alert and oriented to person, place, and time.  Skin: Skin is warm and dry.  Psychiatric: He has a normal mood and affect. His speech is normal and behavior is normal. Thought content normal.  Nursing note and vitals reviewed.    ED Treatments / Results  Labs (all labs ordered are listed, but only abnormal results are displayed) Labs Reviewed  CBC - Abnormal; Notable for the following components:      Result Value   WBC 24.7 (*)    RDW 15.8 (*)    All other components within normal limits  BASIC METABOLIC PANEL - Abnormal; Notable for the following components:   Glucose, Bld 146 (*)    Creatinine, Ser 1.47 (*)    GFR calc non Af Amer 45 (*)    GFR calc Af Amer 52 (*)    All other components within normal limits  I-STAT CG4 LACTIC ACID, ED - Abnormal; Notable for the following components:   Lactic Acid, Venous 2.56 (*)    All other components within normal limits  HEPARIN LEVEL (UNFRACTIONATED)  I-STAT TROPONIN, ED    EKG  EKG Interpretation  Date/Time:  Monday February 02 2017 12:26:42 EST Ventricular Rate:  130 PR Interval:    QRS Duration: 72 QT Interval:  288 QTC Calculation: 423 R Axis:   58 Text Interpretation:  Atrial fibrillation with rapid ventricular response Moderate voltage criteria for LVH, may be normal variant Abnormal ECG Otherwise no significant change Confirmed by Deno Etienne [phone]) on 02/02/2017 12:43:41 PM       Radiology Ct Angio Chest Pe W And/or Wo Contrast  Result Date: 02/02/2017 CLINICAL DATA:  76 year old male with history of shortness of breath and weakness. EXAM: CT ANGIOGRAPHY CHEST WITH CONTRAST TECHNIQUE: Multidetector CT imaging of the chest was performed using the standard protocol during bolus administration of intravenous contrast. Multiplanar CT image reconstructions and MIPs were obtained to evaluate the vascular anatomy. CONTRAST:  162mL ISOVUE-370 IOPAMIDOL (ISOVUE-370) INJECTION 76% COMPARISON:  Chest CT 06/29/2008. FINDINGS: Cardiovascular: Study is limited by considerable respiratory motion such that subsegmental and distal segmental sized emboli cannot be completely excluded in certain portions of the lungs, most notably in the lower lobes of the lungs bilaterally. With these limitations in mind, there is no central, lobar or proximal segmental sized filling defect to suggest clinically relevant pulmonary embolism. Heart size is borderline enlarged. There is no significant pericardial fluid, thickening or pericardial calcification. There is aortic atherosclerosis, as well as atherosclerosis of the great vessels of the mediastinum and the coronary arteries, including calcified atherosclerotic plaque in the left anterior descending and right coronary arteries. Mediastinum/Nodes: Multiple borderline enlarged and enlarged mediastinal and bilateral hilar lymph nodes are noted, with the largest of these measuring up to 1.9 cm in the right infrahilar region, and up to 1.3 cm in the low right paratracheal nodal station. Esophagus is unremarkable in appearance. No axillary lymphadenopathy. Lungs/Pleura: Diffuse bronchial wall thickening with scattered areas of more severe thickening of the peribronchovascular interstitium with some mild cylindrical bronchiectasis and extensive regions of mucoid impaction, most evident in the lower lobes of the lungs  bilaterally. These areas of greatest involvement also demonstrate associated peripheral peribronchovascular septal thickening and architectural distortion which is poorly evaluated on today's motion limited examination. There is also a background of mild centrilobular and paraseptal emphysema. No pleural effusions.  Upper Abdomen: Aortic atherosclerosis. Status post cholecystectomy. Diffuse low attenuation throughout the visualized hepatic parenchyma, indicative of hepatic steatosis. Diffuse low-attenuation enlargement (-2 HU) of the left adrenal gland, indicative of adenomatous hyperplasia, similar in retrospect to prior study from 2010. Musculoskeletal: There are no aggressive appearing lytic or blastic lesions noted in the visualized portions of the skeleton. Review of the MIP images confirms the above findings. IMPRESSION: 1. Despite the limitations of today's examination there is no evidence to suggest central, lobar or proximal segmental sized pulmonary embolism. More distal segmental and subsegmental sized emboli cannot be entirely excluded, particularly in the lower lobes of the lungs bilaterally. 2. However, despite the limitations of today's examination there is a plausible explanation for the patient shortness of breath. Specifically, there is evidence of probable chronic atypical infection, as discussed above. Alternatively, findings could reflect a severe acute bronchitis with early developing bronchopneumonia most evident in the right lower lobe. Further evaluation by pulmonology is recommended in the near future. 3. Extensive mediastinal and bilateral hilar lymphadenopathy. This could be reactive in the setting of chronic atypical infection, or could be indicative of underlying lymphoproliferative disorder. Alternative diagnosis such as sarcoidosis could also be considered, but is not strongly favored at this time. 4. Aortic atherosclerosis, in addition to 2 vessel coronary artery disease. Assessment  for potential risk factor modification, dietary therapy or pharmacologic therapy may be warranted, if clinically indicated. 5. Mild centrilobular and paraseptal emphysema. 6. Hepatic steatosis. 7. Additional incidental findings, as above. Aortic Atherosclerosis (ICD10-I70.0) and Emphysema (ICD10-J43.9). Electronically Signed   By: Vinnie Langton M.D.   On: 02/02/2017 14:52   Dg Chest Portable 1 View  Result Date: 02/02/2017 CLINICAL DATA:  Short of breath EXAM: PORTABLE CHEST 1 VIEW COMPARISON:  12/12/2016 FINDINGS: Interval development of patchy bibasilar airspace disease. Lungs were clear previously. COPD with hyperinflation. Negative for heart failure or effusion. Right shoulder replacement IMPRESSION: COPD.  Interval development of bibasilar atelectasis/pneumonia. Electronically Signed   By: Franchot Gallo M.D.   On: 02/02/2017 13:39    Procedures Procedures (including critical care time) CRITICAL CARE Performed by: Ozella Rocks   Total critical care time: 35 minutes  Critical care time was exclusive of separately billable procedures and treating other patients.  Critical care was necessary to treat or prevent imminent or life-threatening deterioration.  Critical care was time spent personally by me on the following activities: development of treatment plan with patient and/or surrogate as well as nursing, discussions with consultants, evaluation of patient's response to treatment, examination of patient, obtaining history from patient or surrogate, ordering and performing treatments and interventions, ordering and review of laboratory studies, ordering and review of radiographic studies, pulse oximetry and re-evaluation of patient's condition.   Medications Ordered in ED Medications  diltiazem (CARDIZEM) 1 mg/mL load via infusion 20 mg (20 mg Intravenous Bolus from Bag 02/02/17 1317)    And  diltiazem (CARDIZEM) 100 mg in dextrose 5 % 100 mL (1 mg/mL) infusion (5 mg/hr Intravenous  New Bag/Given 02/02/17 1318)  heparin ADULT infusion 100 units/mL (25000 units/255mL sodium chloride 0.45%) (1,350 Units/hr Intravenous New Bag/Given 02/02/17 1353)  levofloxacin (LEVAQUIN) IVPB 750 mg (750 mg Intravenous New Bag/Given 02/02/17 1347)  heparin bolus via infusion 5,000 Units (5,000 Units Intravenous Bolus from Bag 02/02/17 1352)  methylPREDNISolone sodium succinate (SOLU-MEDROL) 125 mg/2 mL injection 125 mg (125 mg Intravenous Given 02/02/17 1345)  magnesium sulfate IVPB 2 g 50 mL (0 g Intravenous Stopped 02/02/17 1455)  ipratropium-albuterol (DUONEB) 0.5-2.5 (3)  MG/3ML nebulizer solution 3 mL (3 mLs Nebulization Given 02/02/17 1345)  sodium chloride 0.9 % bolus 1,000 mL (1,000 mLs Intravenous New Bag/Given 02/02/17 1403)  iopamidol (ISOVUE-370) 76 % injection (100 mLs  Contrast Given 02/02/17 1400)     Initial Impression / Assessment and Plan / ED Course  I have reviewed the triage vital signs and the nursing notes.  Pertinent labs & imaging results that were available during my care of the patient were reviewed by me and considered in my medical decision making (see chart for details).  Final Clinical Impressions(s) / ED Diagnoses  {I have reviewed and evaluated the relevant laboratory values. {I have reviewed and evaluated the relevant imaging studies. {I have interpreted the relevant EKG. {I have reviewed the relevant previous healthcare records. {I have reviewed EMS Documentation. {I obtained HPI from historian. {Patient discussed with supervising physician.  ED Course:  Assessment: Pt is a 76 y.o. male with a hx of COPD, HTN, presents to the Emergency Department today from [place] office due to new onset Atrial fibrillation. Notes productive cough since Friday with increased WOB. Subjective fevers. No N/V/D. No CP/ABD pain. No diaphoresis. No numbness/tingling. Low O2 saturations in low 80s were noted in Pulmonologist's office. Pt does not use home O2. Placed on 4L [place]  with O2 sat 94%. Pt denies pain. On exam, pt in NAD. Nontoxic/nonseptic appearing. VS with tachycardia. Hypoxia in 80s. Placed on 4L  with improvement. Afebrile. Lungs CTA. Heart RRR. Abdomen nontender soft. EKG shows Afib with RVR. CHADVASc 4. Pt outside window for cardioversion. Started on [employer] as well as Heparin. Trop 0.06. CBC with elevated WBC at 28. iStat Lactic 2.56. Suspect neb treatments contributing to this as well. BMP with mild elevation in Creatinine. Repeat EKG shows possible MAT due to presence of P waves. CXR with evidence of COPD. Will treat possible underlying pulmonary etiology with solumedrol, neb, magnesium, as well as Levaquin for suspect COPD exacerbation. CT Angio without PE. Shows possible bronchopneumonia in RLL. Plan is to [place].   Disposition/Plan:  Admit Pt acknowledges and agrees with plan  Supervising Physician Deno Etienne, DO  Final diagnoses:  Atypical pneumonia  Multifocal atrial tachycardia [facility])    ED Discharge Orders    None       Shary Decamp, PA-C 02/02/17 [place], [place], DO 02/02/17 1627

## 2017-02-02 NOTE — Plan of Care (Signed)
Pt with orthopnea and SOB at rest. Currently received 6L of O2 via [place]. Pt also on cardizem gtt for rapid A.Fib, HR ranging from 110-150's. Cardizem gtt titrated up to 96mcg/min for optimum HR control. Pt understanding of medication regime and all questions have been answered at this time.

## 2017-02-02 NOTE — Assessment & Plan Note (Addendum)
-   PFTs  05/27/12   FEV1  2.04(62%) ratio 54  - 12/12/2015 try off spiriva since pt says not working > increased need for saba - Spirometry 01/09/2016  FEV1 1.19 (47%)  Ratio 50 p nothing prior and classic curvature on f/v loop - 01/09/2016  After extensive coaching HFA effectiveness =    75% try symbicort 160 2bid   - 12/12/2016  After extensive coaching devie effectiveness =    75% with smi > add spiriva 2 each am  - Spirometry 01/08/2017  FEV1 1.52 (48%)  Ratio 59   - 01/08/2017  After extensive coaching HFA effectiveness =    75% from a baselin of 25%  - 01/08/2017 trial of bevespi    Acute flare with purulent tracheobronchitis and difficulty with maintaining med reconciliation / assoc acute afib > to ER    I had an extended discussion with the patient reviewing all relevant studies completed to date and  lasting 25 minutes of a 40  minute acute extended visit addressing   Unstable/ severe non-specific but potentially very serious refractory respiratory symptoms of uncertain and potentially multiple  etiologies.

## 2017-02-02 NOTE — ED Notes (Signed)
ED Provider at bedside. 

## 2017-02-02 NOTE — ED Notes (Signed)
Pt c.o being hot, auir conditioning turned down and ice packs applied to axilla bilaterally. Pt oral temp checked to be 98.5

## 2017-02-02 NOTE — ED Notes (Signed)
I Stat Lactic Acid results shown to Shary Decamp PA

## 2017-02-02 NOTE — Assessment & Plan Note (Addendum)
In setting of acute bronchitis/ RAF > corrects to mid 90s on 2lpm np   Will need to re-eval 02 needs after rx acute exac

## 2017-02-02 NOTE — Progress Notes (Signed)
Subjective:     Patient ID: Derrick Moon, male   DOB: November 13, 1940   MRN: [id number]      .Brief patient profile:  75 yowm quit smoking cigs 1990 / still smoking some cigars referred to pulmonary clinic 12/12/2015 by Dr   Willey Blade for sob s/p neg cards eval by Dr Dwana Curd (though doen'st look like pt completed the myocardial perfusion study scheduled for 08/15/15) with COPD GOLD IIdx   .History of Present Illness  12/12/2015 1st [facility] office visit/ [facility]   Chief Complaint  Patient presents with  . Pulmonary Consult    Referred by Dr. [name]. Pt c/o dizziness with exertion for the past 6 months. His spouse states after he has been outside working, he comes in SOB.  He has prod cough with clear sputum in the am's.   variably noisy breathing x 6 months seems to bother wife more than husband  MMRC2 = can't walk a nl pace on a flat grade s sob but does fine slow and flat eg grocery shopping  Sleeping fine off cpap but wakes up every am with throat congestion/ uses halls  spiriva not helping / hfa may help/ prednisone does not help rec avapro  150 mg one daily - if light headed when you stand up break it in half  Stop spiriva and lisinopril and the halls for now GERD diet Only use your albuterol as a rescue medication     01/09/2016  f/u ov/Wert re:  GOLD II, no maint rx/using saba bid most days / still smoking cigars  Chief Complaint  Patient presents with  . Shortness of Breath    Breathing is improved since last OV. Reports slight SOB and chest congestion. Denies  chest tightness, wheezing or coughing.   tends to needVentolin first thing am for am "congestion" Can now walk the dog s sob/ cp = MMRC2 = can't walk a nl pace on a flat grade s sob but does fine slow and flat eg shopping/ walking the dog  rec Plan A = Automatic = Symbicort 160 Take 2 puffs first thing in am and then another 2 puffs about 12 hours later.  Plan B = Backup Only use your albuterol as a rescue  medication     12/12/2016  f/u ov/Wert re: copd GOLD II/ symb 160 2bid and daily albuterol around 2 pm Chief Complaint  Patient presents with  . Follow-up    Pt states he is here for his annual followup.  He states his breathing is "pretty good".  He states he was seen by his PCP on 12/03/16 and was dxed with lung infection- txed with pred and doxy.  He has some cough with large amounts of clear sputum.  He is using his albuterol inhaler 1 x daily on average.   worse for several months cough/ congestion esp after supper, does ok at hs/noct  But wakes up most  ams with  One tbsp thick mucus while on symbicort rx pred/doxy > 50% improved / still smoking mostly cigars  Much better breathing  indoors vs outdoors but still mmrc = 2  Needing benadryl to sleep due to sense of pnds   Needing sab at least once daily  rec Plan A = Automatic = symbicort 160 x2 puffs followed by spiriva 2 puffs first thing in am then 12 hours later 2 pffs symbicort only Work on inhaler technique:  relax and gently blow all the way out then take a nice smooth  deep breath back in, triggering the inhaler at same time you start breathing in.  Hold for up to 5 seconds if you can. Blow out thru nose. Rinse and gargle with water when done Plan B = Backup Only use your albuterol as a rescue medication Please remember to go to the  x-ray department downstairs in the basement  for your tests - we will call you with the results when they are available. The key is to stop smoking completely before smoking completely stops you!     01/08/2017  f/u ov/Wert re:  GOLD II copd on just prn saba  Chief Complaint  Patient presents with  . Follow-up    Breathing has improved some.  He stopped Symbicort due to "too much congestion" and since he stopped med he feels better. He is using his albuterol inhaler once daily on average.   took ventolin x one puff x 3.5 hours prior to OV   Sleeps poorly due to shoulder pain / no am cough  /congestion  No better on symb/spiriva so stopped both  - says can't walk across parking lot s stopping due to sob x one year, no better on symb or spiriva or combo of the two but ventolin really helps (note hfa quite poor)  Doe = MMRC3 = can't walk 100 yards even at a slow pace at a flat grade s stopping due to sob   rec Plan A = Automatic = Bevespi Take 2 puffs first thing in am and then another 2 puffs about 12 hours later.  Try to be as active as your hip will allow to see if your breathing is better on this med than prior to using it Work on inhaler technique: Plan B = Backup Only use your albuterol (Ventolin) as a rescue   Please schedule a follow up office visit in 6 weeks    02/02/2017 acute extended ov/Wert re:  Chief Complaint  Patient presents with  . Acute Visit    Increased SOB and cough for the past 2 wks. He is coughing up yellow sputum.    onset  01/24/17 sob/cough getting worse while on bevespi and cut down down saba  Started coughing up dark yellow mucus at onset and gradually worse sob to point where sob at rest and unable to lie in bed with severe coughing spasms/ confused with details of care ? On bevespi an spiriva? Plus saba up to every 4 hours    No obvious day to day or daytime variability or assoc excess/ purulent sputum or mucus plugs or hemoptysis or cp or chest tightness, subjective wheeze or overt sinus or hb symptoms. No unusual exp hx or h/o childhood pna/ asthma or knowledge of premature birth.  Sleeping ok flat without nocturnal  or early am exacerbation  of respiratory  c/o's or need for noct saba. Also denies any obvious fluctuation of symptoms with weather or environmental changes or other aggravating or alleviating factors except as outlined above   Current Allergies, Complete Past Medical History, Past Surgical History, Family History, and Social History were reviewed in [employer] record.  ROS  The following are not active  complaints unless bolded Hoarseness, sore throat, dysphagia, dental problems, itching, sneezing,  nasal congestion or discharge of excess mucus or purulent secretions, ear ache,   fever, chills, sweats, unintended wt loss or wt gain, classically pleuritic or exertional cp,  orthopnea pnd or leg swelling, presyncope, palpitations, abdominal pain, anorexia, nausea, vomiting, diarrhea  or change in bowel habits or change in bladder habits, change in stools or change in urine, dysuria, hematuria,  rash, arthralgias, visual complaints, headache, numbness, weakness or ataxia or problems with walking or coordination,  change in mood/affect or memory.        Current Meds  Medication Sig  . albuterol (PROVENTIL HFA;VENTOLIN HFA) 108 (90 BASE) MCG/ACT inhaler Inhale 1-2 puffs into the lungs every 6 (six) hours as needed for wheezing or shortness of breath. Reported on 10/03/2015  . aspirin EC 81 MG tablet Take 1 tablet (81 mg total) by mouth 2 (two) times daily.  . diphenhydrAMINE (BENADRYL) 25 MG tablet Take 25 mg by mouth every 6 (six) hours as needed.  . Glycopyrrolate-Formoterol (BEVESPI AEROSPHERE) 9-4.8 MCG/ACT AERO Inhale 2 puffs into the lungs 2 (two) times daily.  . irbesartan (AVAPRO) 150 MG tablet Take 1 tablet (150 mg total) by mouth daily.  . methocarbamol (ROBAXIN) 500 MG tablet Take 1 tablet (500 mg total) by mouth 3 (three) times daily as needed.  . Tamsulosin HCl (FLOMAX) 0.4 MG CAPS Take 0.4 mg by mouth 2 (two) times daily.   . Tiotropium Bromide Monohydrate (SPIRIVA RESPIMAT) 2.5 MCG/ACT AERS Inhale 2 puffs into the lungs daily.                Objective:   Physical Exam  amb wm acutely ill appearing   02/02/2017      189  12/12/2016        201  01/09/2016      198   12/12/15 198 lb (89.8 kg)  11/09/15 201 lb 12.8 oz (91.5 kg)  10/03/15 199 lb (90.3 kg)    Vital signs reviewed -   - Note on arrival 02 sats  89% on 2lpm  and bp 118/64    HEENT: poor  dentition, turbinates  bilaterally, and oropharynx. Nl external ear canals without cough reflex   NECK :  without JVD/Nodes/TM/ nl carotid upstrokes bilaterally   LUNGS: no acc muscle use,   Barrel chest with increased wob at rest/ junky ronchi in both bases on insp/ no real wheezing but air movement is diminished  CV:  RRR  no s3 or murmur or increase in P2, and no edema   ABD:  soft and nontender with nl inspiratory excursion in the supine position. No bruits or organomegaly appreciated, bowel sounds nl  MS:  Nl gait/ ext warm without deformities, calf tenderness, cyanosis or clubbing No obvious joint restrictions   SKIN: warm and dry with serpiginious vericosities both legs/ankles     NEURO:  alert, approp, nl sensorium with  no motor or cerebellar deficits apparent.     02/02/2017  EKG =  Rapid afib around 140          Assessment:

## 2017-02-02 NOTE — Patient Instructions (Signed)
Go to [place] °

## 2017-02-02 NOTE — ED Notes (Signed)
Patient transported to CT 

## 2017-02-02 NOTE — ED Notes (Signed)
Venti mask at 6L applied to pt due to orthopnea.

## 2017-02-02 NOTE — Progress Notes (Signed)
ANTICOAGULATION CONSULT NOTE  Pharmacy Consult for heparin Indication: atrial fibrillation  Heparin Dosing Weight: 85.7 kg   Assessment: 19 yom presenting with new-onset afib. Pharmacy consulted to dose heparin. Not on anticoagulation PTA. No bleeding hx documented. CBC pending.  Goal of Therapy:  Heparin level 0.3-0.7 units/ml Monitor platelets by anticoagulation protocol: Yes   Plan:  Heparin 5000 unit bolus Start heparin at 1350 units/h 8h heparin level Daily heparin level/CBC Monitor s/sx bleeding F/u plans for long-term anticoagulation  Elicia Lamp, PharmD, BCPS Clinical Pharmacist 02/02/2017 1:06 PM

## 2017-02-02 NOTE — H&P (Signed)
History and Physical    Derrick Moon NFA:[id number] DOB: September 08, 1940 DOA: 02/02/2017  PCP: [name], MD Patient coming from: home  Chief Complaint: increasing shortness of breath  HPI: Derrick Moon is a 76 y.o. male with medical history significant of Bell's palsy, bladder cancer, COPD, diverticulitis, BPH, OSA, restless leg syndrome, PAD, hypertension, hiatal hernia, colonic polyps, bronchitis/COPD.  Patient reports approximately 5-6 day history of increasing work of breathing with intermittent productive cough and subjective fevers. Denies any chest pain, palpitations, nausea, vomiting, abdominal pain, dysuria, frequency, neck stiffness, focal neurological deficit. Patient endorses using intermittent home breathing treatments with minimal improvement. Patient states that he was at the point of no longer being able to handle the symptoms which is what brought him into his pulmonologist, Dr. Melvyn Novas. The pulmonologists office patient was noted to be significantly tachycardic, with O2 saturations in the mid 80s, and in atrial fibrillation and extremities and was sent emergency room via EMS.  ED Course: Allergy consult. Patient was on diltiazem drip. Patient started on heparin infusion. Patient given multiple rounds of nebulizer treatments in addition to magnesium and Levaquin.  Review of Systems: As per HPI otherwise all other systems reviewed and are negative  Ambulatory Status:limited secondary to respiratory status at baseline.   Past Medical History:  Diagnosis Date  . AAA (abdominal aortic aneurysm) ([place])   . Anxiety    due to pain in leg  . Arthritis    Osteo  . Bell's palsy   . Cancer [facility])    Bladder cancer  . Cataracts, bilateral   . COPD (chronic obstructive pulmonary disease) (HCC)    Albuterol inhaler as needed  . Diverticulitis   . Enlarged prostate    takes Flomax daily  . GERD (gastroesophageal reflux disease)    was on Prilosec but has been off for yrs-no issues  now with reflux  . Headache    migraines years ago  . History of bronchitis    > 79yrs ago  . History of colon polyps    benign  . History of dizziness    several yrs ago-was on Meclizine but has been off for several yrs-no problems since  . History of hiatal hernia   . Hypertension    takes Amlodipine and Lisinpril daily  . Joint pain   . Joint swelling   . Nocturia   . PAD (peripheral artery disease) (HCC)     bilateral superficial femoral artery occlusions   . Pneumonia    several times with most recent within 5 yrs ago  . Restless leg syndrome   . Rupture of left quadriceps tendon   . Shortness of breath dyspnea    with exertion  . Sleep apnea    does not use cpap  . Urinary urgency     Past Surgical History:  Procedure Laterality Date  . BICEPS TENDON REPAIR     LEFT  . CARPAL TUNNEL RELEASE Right 2014  . CHOLECYSTECTOMY  July 16,2010  . COLONOSCOPY    . CYSTOSCOPY    . ESOPHAGOGASTRODUODENOSCOPY    . FOOT SURGERY Right    x 5t  . HAND SURGERY Right   . HERNIA REPAIR Bilateral    inguinal  . REVERSE TOTAL SHOULDER ARTHROPLASTY Right 07/07/2014   Dr Veverly Fells  . ROTATOR CUFF REPAIR     Bilateral   . TONSILLECTOMY    . tumor removed from bladder      Social History   Socioeconomic History  . Marital  status: Married    Spouse name: Not on file  . Number of children: Not on file  . Years of education: Not on file  . Highest education level: Not on file  Social Needs  . Financial resource strain: Not on file  . Food insecurity - worry: Not on file  . Food insecurity - inability: Not on file  . Transportation needs - medical: Not on file  . Transportation needs - non-medical: Not on file  Occupational History  . Not on file  Tobacco Use  . Smoking status: Former Smoker    Years: 45.00    Types: Cigars    Last attempt to quit: 01/23/2017    Years since quitting: 0.0  . Smokeless tobacco: Never Used  Substance and Sexual Activity  . Alcohol use: Yes      Alcohol/week: 0.0 oz    Comment: wine occasionally  . Drug use: Yes    Types: Marijuana    Comment: rare; last use 2017  . Sexual activity: Yes  Other Topics Concern  . Not on file  Social History Narrative   Pt lives w/ wife in [place], [place]    Allergies  Allergen Reactions  . Aspartame And Phenylalanine Nausea Only  . Other Rash    Pt reports rash on back, buttocks, shoulders and stomach after arteriogram  . Sulfonamide Derivatives Nausea And Vomiting    Family History  Problem Relation Age of Onset  . Parkinson's disease Mother   . AAA (abdominal aortic aneurysm) Father   . Heart defect Sister        Congenital Heart Disease  . Heart disease Sister       Prior to Admission medications   Medication Sig Start Date End Date Taking? Authorizing Provider  acetaminophen (TYLENOL) 500 MG tablet Take 1,000 mg every 6 (six) hours as needed by mouth.   Yes [provider]  albuterol (PROVENTIL HFA;VENTOLIN HFA) 108 (90 BASE) MCG/ACT inhaler Inhale 1-2 puffs into the lungs every 6 (six) hours as needed for wheezing or shortness of breath. Reported on 10/03/2015   Yes [provider]  aspirin EC 81 MG tablet Take 1 tablet (81 mg total) by mouth 2 (two) times daily. 08/22/16  Yes Netta Cedars, MD  diphenhydrAMINE (BENADRYL) 25 MG tablet Take 25 mg by mouth every 6 (six) hours as needed.   Yes [provider]  Glycopyrrolate-Formoterol (BEVESPI AEROSPHERE) 9-4.8 MCG/ACT AERO Inhale 2 puffs into the lungs 2 (two) times daily. 01/08/17  Yes Tanda Rockers, MD  irbesartan (AVAPRO) 150 MG tablet Take 1 tablet (150 mg total) by mouth daily. 12/12/16  Yes Tanda Rockers, MD  Tamsulosin HCl (FLOMAX) 0.4 MG CAPS Take 0.4 mg by mouth 2 (two) times daily.    Yes [provider]  Tiotropium Bromide Monohydrate (SPIRIVA RESPIMAT) 2.5 MCG/ACT AERS Inhale 2 puffs into the lungs daily. 12/12/16  Yes Tanda Rockers, MD  methocarbamol (ROBAXIN) 500 MG tablet Take 1  tablet (500 mg total) by mouth 3 (three) times daily as needed. Patient not taking: Reported on 02/02/2017 08/22/16   Netta Cedars, MD    Physical Exam: Vitals:   02/02/17 1530 02/02/17 1537 02/02/17 1545 02/02/17 1615  BP: 115/80 115/80 124/75 131/72  Pulse: (!) 110 (!) 114 (!) 105 (!) 107  Resp: (!) 36 (!) 32 (!) 33 (!) 28  Temp:      TempSrc:      SpO2: 92% 93% (!) 89% 90%  Weight:  Height:         General: Anxious. Lying in bed. Eyes:  PERRL, EOMI, normal lids, iris ENT: Poor dentition, dry mucous membranes Neck:  no LAD, masses or thyromegaly Cardiovascular: Difficult to appreciate cardiac sounds. Trace bilateral lower extremity pitting edema. Tachycardic. Irregular. Respiratory: Wheezing and rhonchi throughout. Marked increased effort. Diminished breath sounds throughout. Abdomen:  soft, ntnd, NABS Skin:  no rash or induration seen on limited exam Musculoskeletal:  grossly normal tone BUE/BLE, good ROM, no bony abnormality Psychiatric:  grossly normal mood and affect, speech fluent and appropriate, AOx3 Neurologic:  CN 2-12 grossly intact, moves all extremities in coordinated fashion, sensation intact  Labs on Admission: I have personally reviewed following labs and imaging studies  CBC: Recent Labs  Lab 02/02/17 1245  WBC 24.7*  HGB 14.9  HCT 45.9  MCV 91.8  PLT 341   Basic Metabolic Panel: Recent Labs  Lab 02/02/17 1245  NA 139  K 3.7  CL 103  CO2 25  GLUCOSE 146*  BUN 20  CREATININE 1.47*  CALCIUM 9.3   GFR: Estimated Creatinine Clearance: 45.5 mL/min (A) (by C-G formula based on SCr of 1.47 mg/dL (H)). Liver Function Tests: No results for input(s): AST, ALT, ALKPHOS, BILITOT, PROT, ALBUMIN in the last 168 hours. No results for input(s): LIPASE, AMYLASE in the last 168 hours. No results for input(s): AMMONIA in the last 168 hours. Coagulation Profile: No results for input(s): INR, PROTIME in the last 168 hours. Cardiac Enzymes: No results  for input(s): CKTOTAL, CKMB, CKMBINDEX, TROPONINI in the last 168 hours. BNP (last 3 results) No results for input(s): PROBNP in the last 8760 hours. HbA1C: No results for input(s): HGBA1C in the last 72 hours. CBG: No results for input(s): GLUCAP in the last 168 hours. Lipid Profile: No results for input(s): CHOL, HDL, LDLCALC, TRIG, CHOLHDL, LDLDIRECT in the last 72 hours. Thyroid Function Tests: No results for input(s): TSH, T4TOTAL, FREET4, T3FREE, THYROIDAB in the last 72 hours. Anemia Panel: No results for input(s): VITAMINB12, FOLATE, FERRITIN, TIBC, IRON, RETICCTPCT in the last 72 hours. Urine analysis:    Component Value Date/Time   COLORURINE YELLOW 09/14/2015 [place] 09/14/2015 0934   LABSPEC 1.028 09/14/2015 0934   PHURINE 5.0 09/14/2015 0934   GLUCOSEU NEGATIVE 09/14/2015 0934   HGBUR NEGATIVE 09/14/2015 0934   BILIRUBINUR NEGATIVE 09/14/2015 0934   KETONESUR NEGATIVE 09/14/2015 0934   PROTEINUR NEGATIVE 09/14/2015 0934   UROBILINOGEN 0.2 10/06/2008 0800   NITRITE NEGATIVE 09/14/2015 0934   LEUKOCYTESUR NEGATIVE 09/14/2015 0934    Creatinine Clearance: Estimated Creatinine Clearance: 45.5 mL/min (A) (by C-G formula based on SCr of 1.47 mg/dL (H)).  Sepsis Labs: @LABRCNTIP (procalcitonin:4,lacticidven:4) )No results found for this or any previous visit (from the past 240 hour(s)).   Radiological Exams on Admission: Ct Angio Chest Pe W And/or Wo Contrast  Result Date: 02/02/2017 CLINICAL DATA:  76 year old male with history of shortness of breath and weakness. EXAM: CT ANGIOGRAPHY CHEST WITH CONTRAST TECHNIQUE: Multidetector CT imaging of the chest was performed using the standard protocol during bolus administration of intravenous contrast. Multiplanar CT image reconstructions and MIPs were obtained to evaluate the vascular anatomy. CONTRAST:  151mL ISOVUE-370 IOPAMIDOL (ISOVUE-370) INJECTION 76% COMPARISON:  Chest CT 06/29/2008. FINDINGS:  Cardiovascular: Study is limited by considerable respiratory motion such that subsegmental and distal segmental sized emboli cannot be completely excluded in certain portions of the lungs, most notably in the lower lobes of the lungs bilaterally. With these limitations in  mind, there is no central, lobar or proximal segmental sized filling defect to suggest clinically relevant pulmonary embolism. Heart size is borderline enlarged. There is no significant pericardial fluid, thickening or pericardial calcification. There is aortic atherosclerosis, as well as atherosclerosis of the great vessels of the mediastinum and the coronary arteries, including calcified atherosclerotic plaque in the left anterior descending and right coronary arteries. Mediastinum/Nodes: Multiple borderline enlarged and enlarged mediastinal and bilateral hilar lymph nodes are noted, with the largest of these measuring up to 1.9 cm in the right infrahilar region, and up to 1.3 cm in the low right paratracheal nodal station. Esophagus is unremarkable in appearance. No axillary lymphadenopathy. Lungs/Pleura: Diffuse bronchial wall thickening with scattered areas of more severe thickening of the peribronchovascular interstitium with some mild cylindrical bronchiectasis and extensive regions of mucoid impaction, most evident in the lower lobes of the lungs bilaterally. These areas of greatest involvement also demonstrate associated peripheral peribronchovascular septal thickening and architectural distortion which is poorly evaluated on today's motion limited examination. There is also a background of mild centrilobular and paraseptal emphysema. No pleural effusions. Upper Abdomen: Aortic atherosclerosis. Status post cholecystectomy. Diffuse low attenuation throughout the visualized hepatic parenchyma, indicative of hepatic steatosis. Diffuse low-attenuation enlargement (-2 HU) of the left adrenal gland, indicative of adenomatous hyperplasia, similar  in retrospect to prior study from 2010. Musculoskeletal: There are no aggressive appearing lytic or blastic lesions noted in the visualized portions of the skeleton. Review of the MIP images confirms the above findings. IMPRESSION: 1. Despite the limitations of today's examination there is no evidence to suggest central, lobar or proximal segmental sized pulmonary embolism. More distal segmental and subsegmental sized emboli cannot be entirely excluded, particularly in the lower lobes of the lungs bilaterally. 2. However, despite the limitations of today's examination there is a plausible explanation for the patient shortness of breath. Specifically, there is evidence of probable chronic atypical infection, as discussed above. Alternatively, findings could reflect a severe acute bronchitis with early developing bronchopneumonia most evident in the right lower lobe. Further evaluation by pulmonology is recommended in the near future. 3. Extensive mediastinal and bilateral hilar lymphadenopathy. This could be reactive in the setting of chronic atypical infection, or could be indicative of underlying lymphoproliferative disorder. Alternative diagnosis such as sarcoidosis could also be considered, but is not strongly favored at this time. 4. Aortic atherosclerosis, in addition to 2 vessel coronary artery disease. Assessment for potential risk factor modification, dietary therapy or pharmacologic therapy may be warranted, if clinically indicated. 5. Mild centrilobular and paraseptal emphysema. 6. Hepatic steatosis. 7. Additional incidental findings, as above. Aortic Atherosclerosis (ICD10-I70.0) and Emphysema (ICD10-J43.9). Electronically Signed   By: Vinnie Langton M.D.   On: 02/02/2017 14:52   Dg Chest Portable 1 View  Result Date: 02/02/2017 CLINICAL DATA:  Short of breath EXAM: PORTABLE CHEST 1 VIEW COMPARISON:  12/12/2016 FINDINGS: Interval development of patchy bibasilar airspace disease. Lungs were clear  previously. COPD with hyperinflation. Negative for heart failure or effusion. Right shoulder replacement IMPRESSION: COPD.  Interval development of bibasilar atelectasis/pneumonia. Electronically Signed   By: Franchot Gallo M.D.   On: 02/02/2017 13:39    EKG: Independently reviewed. Afib, RVR, no ACS  Assessment/Plan Principal Problem:   Acute on chronic respiratory failure (HCC) Active Problems:   Essential hypertension   Smoking   New onset a-fib ([place])   COPD with acute exacerbation (HCC)   Atrial fibrillation with RVR (HCC)   Sepsis, unspecified organism ([place])   Sepsis: likely from  pulmonary infection. Lacitic acid 2.56. Respiratory rate between 30 and 40, tachycardia with intermittent hypotension and hypoxemia. WBC 24.7 with a AKI with a creatinine of 1.47. - Treatment plan as below. - Sepsis protocol  Acute on chronic respiratory failure: O2 sats in 80s. Likely from purulent tracheobronchitis (Dr Gustavus Bryant note). Seen by pulmonologist, Dr Melvyn Novas, in office on day of admission.  - Levaquin, mucinex, Duonebs Q6/Q2prn, Solumedrol 60 Q8 - sputum Cx - Wean O2 as able - Continue spiriva - Procalcitonin, Respiratory viral panel - f/u Dr Melvyn Novas in 1 wk from time of DC.   Afib RVR: New Dx. Cardiology consulted by EDP and greatly appreciate their input in the mgt of this pt.  - Echo  - continue Dilt drip, further mgt per cardiology - Further mgt per cards.   AKI:Cr 1.47. Baseline 0.99 - IVF  HTN: - continue Avapro  BPH: - continue flomax  Tobacco: smokes 3-4 cigars per day. Last tobacco use 10 days prior to admission - No further intervention at this time.     DVT prophylaxis: Heparin  Code Status: full  Family Communication: wife  Disposition Plan: pending improvement in respiraotry status and Afib w/ RVR per cardiology  Consults called: Cardiology  Admission status: inpt SDU    Waldemar Dickens MD [facility]  If 7PM-7AM, please contact  night-coverage [email or web address] Password [facility]  02/02/2017, 5:34 PM

## 2017-02-02 NOTE — ED Triage Notes (Signed)
Pt started having productive cough on Friday, (light green colored). Pt went to MD office today for evaluation of SOB and cough. Sats in low 80's at office, placed on 4L [place] with improvement to 94%. Pt has labored breathing. Possible new onset Afib (?) per EMS. HR 100-150. Pt alert and oriented.

## 2017-02-02 NOTE — ED Notes (Signed)
Pt placed on 6L [place] while eating dinner

## 2017-02-02 NOTE — ED Notes (Signed)
Cardiology at bedside.

## 2017-02-02 NOTE — ED Notes (Signed)
Patient returned from CT

## 2017-02-02 NOTE — Progress Notes (Signed)
[place] for Heparin Indication: atrial fibrillation  Allergies  Allergen Reactions  . Aspartame And Phenylalanine Nausea Only  . Other Rash    Pt reports rash on back, buttocks, shoulders and stomach after arteriogram  . Sulfonamide Derivatives Nausea And Vomiting    Patient Measurements: Height: 5\' 11"  (180.3 cm) Weight: 189 lb (85.7 kg) IBW/kg (Calculated) : 75.3  Vital Signs: Temp: 97.6 F (36.4 C) (11/12 2245) Temp Source: Oral (11/12 2245) BP: 115/66 (11/12 2245) Pulse Rate: 114 (11/12 2245)  Labs: Recent Labs    02/02/17 1245 02/02/17 2148  HGB 14.9  --   HCT 45.9  --   PLT 309  --   HEPARINUNFRC  --  <0.10*  CREATININE 1.47*  --     Estimated Creatinine Clearance: 45.5 mL/min (A) (by C-G formula based on SCr of 1.47 mg/dL (H)).  Assessment: 76 y.o. male with Afib for heparin Goal of Therapy:  Heparin level 0.3-0.7 units/ml Monitor platelets by anticoagulation protocol: Yes   Plan:  Heparin 2000 units IV bolus, then increase heparin 1600 units/hr Follow-up am labs.   Caryl Pina 02/02/2017,11:06 PM

## 2017-02-02 NOTE — Assessment & Plan Note (Signed)
Assoc with increased wob at rest > new onset so needs to go to ER

## 2017-02-02 NOTE — ED Notes (Signed)
Dr. Merrell at bedside. 

## 2017-02-02 NOTE — Progress Notes (Signed)
Pharmacy Antibiotic Note  Derrick Moon is a 76 y.o. male admitted on 02/02/2017 with purulent tracheobronchitis .  Pharmacy has been consulted for Levaquin dosing.  Afebrile, WBC 24.7, LA improved 1.65, Scr 1.47, Resp 41, SPO2 90  Plan: Levaquin 500mg  IV Q24H   F/U renal function, clinical status, C&S, LOT   Height: 5\' 11"  (180.3 cm) Weight: 189 lb (85.7 kg) IBW/kg (Calculated) : 75.3  Temp (24hrs), Avg:98.5 F (36.9 C), Min:98.4 F (36.9 C), Max:98.6 F (37 C)  Recent Labs  Lab 02/02/17 1245 02/02/17 1359 02/02/17 1625  WBC 24.7*  --   --   CREATININE 1.47*  --   --   LATICACIDVEN  --  2.56* 1.65    Estimated Creatinine Clearance: 45.5 mL/min (A) (by C-G formula based on SCr of 1.47 mg/dL (H)).    Allergies  Allergen Reactions  . Aspartame And Phenylalanine Nausea Only  . Other Rash    Pt reports rash on back, buttocks, shoulders and stomach after arteriogram  . Sulfonamide Derivatives Nausea And Vomiting    Antimicrobials this admission: Levaquin 11/12 >>  Dose adjustments this admission: N/A  Microbiology results: N/A  Thank you for allowing pharmacy to be a part of this patient's care.  Felicity Pellegrini  PharmD Candidate '19 01/26/2017 3:30 PM

## 2017-02-03 ENCOUNTER — Inpatient Hospital Stay (HOSPITAL_COMMUNITY): Payer: PPO

## 2017-02-03 DIAGNOSIS — J9621 Acute and chronic respiratory failure with hypoxia: Secondary | ICD-10-CM

## 2017-02-03 DIAGNOSIS — I471 Supraventricular tachycardia: Secondary | ICD-10-CM

## 2017-02-03 DIAGNOSIS — J441 Chronic obstructive pulmonary disease with (acute) exacerbation: Secondary | ICD-10-CM

## 2017-02-03 DIAGNOSIS — F17200 Nicotine dependence, unspecified, uncomplicated: Secondary | ICD-10-CM

## 2017-02-03 DIAGNOSIS — J189 Pneumonia, unspecified organism: Secondary | ICD-10-CM

## 2017-02-03 DIAGNOSIS — I10 Essential (primary) hypertension: Secondary | ICD-10-CM

## 2017-02-03 DIAGNOSIS — A419 Sepsis, unspecified organism: Principal | ICD-10-CM

## 2017-02-03 LAB — CBC
HEMATOCRIT: 44.6 % (ref 39.0–52.0)
Hemoglobin: 14.6 g/dL (ref 13.0–17.0)
MCH: 29.9 pg (ref 26.0–34.0)
MCHC: 32.7 g/dL (ref 30.0–36.0)
MCV: 91.4 fL (ref 78.0–100.0)
PLATELETS: 369 10*3/uL (ref 150–400)
RBC: 4.88 MIL/uL (ref 4.22–5.81)
RDW: 16.1 % — AB (ref 11.5–15.5)
WBC: 20.6 10*3/uL — AB (ref 4.0–10.5)

## 2017-02-03 LAB — RESPIRATORY PANEL BY PCR
Adenovirus: NOT DETECTED
BORDETELLA PERTUSSIS-RVPCR: NOT DETECTED
CHLAMYDOPHILA PNEUMONIAE-RVPPCR: NOT DETECTED
CORONAVIRUS HKU1-RVPPCR: NOT DETECTED
Coronavirus 229E: NOT DETECTED
Coronavirus NL63: NOT DETECTED
Coronavirus OC43: NOT DETECTED
INFLUENZA A-RVPPCR: NOT DETECTED
INFLUENZA B-RVPPCR: NOT DETECTED
METAPNEUMOVIRUS-RVPPCR: NOT DETECTED
Mycoplasma pneumoniae: NOT DETECTED
PARAINFLUENZA VIRUS 2-RVPPCR: NOT DETECTED
PARAINFLUENZA VIRUS 3-RVPPCR: NOT DETECTED
PARAINFLUENZA VIRUS 4-RVPPCR: NOT DETECTED
Parainfluenza Virus 1: NOT DETECTED
RESPIRATORY SYNCYTIAL VIRUS-RVPPCR: NOT DETECTED
RHINOVIRUS / ENTEROVIRUS - RVPPCR: NOT DETECTED

## 2017-02-03 LAB — COMPREHENSIVE METABOLIC PANEL
ALBUMIN: 2.6 g/dL — AB (ref 3.5–5.0)
ALT: 51 U/L (ref 17–63)
ANION GAP: 9 (ref 5–15)
AST: 56 U/L — ABNORMAL HIGH (ref 15–41)
Alkaline Phosphatase: 106 U/L (ref 38–126)
BILIRUBIN TOTAL: 0.7 mg/dL (ref 0.3–1.2)
BUN: 23 mg/dL — ABNORMAL HIGH (ref 6–20)
CO2: 23 mmol/L (ref 22–32)
Calcium: 9.4 mg/dL (ref 8.9–10.3)
Chloride: 107 mmol/L (ref 101–111)
Creatinine, Ser: 1.15 mg/dL (ref 0.61–1.24)
GFR calc Af Amer: >60 mL/min (ref >60)
GFR, EST NON AFRICAN AMERICAN: 60 mL/min — AB (ref >60)
GLUCOSE: 182 mg/dL — AB (ref 65–99)
POTASSIUM: 4 mmol/L (ref 3.5–5.1)
Sodium: 139 mmol/L (ref 135–145)
TOTAL PROTEIN: 6.2 g/dL — AB (ref 6.5–8.1)

## 2017-02-03 LAB — HEPARIN LEVEL (UNFRACTIONATED): Heparin Unfractionated: 0.1 IU/mL — ABNORMAL LOW (ref 0.30–0.70)

## 2017-02-03 LAB — MRSA PCR SCREENING: MRSA BY PCR: NEGATIVE

## 2017-02-03 MED ORDER — SENNOSIDES-DOCUSATE SODIUM 8.6-50 MG PO TABS
2.0000 | ORAL_TABLET | Freq: Every evening | ORAL | Status: DC | PRN
  Administered 2017-02-03: 2 via ORAL
  Filled 2017-02-03: qty 2

## 2017-02-03 MED ORDER — LEVALBUTEROL HCL 0.63 MG/3ML IN NEBU
0.6300 mg | INHALATION_SOLUTION | Freq: Four times a day (QID) | RESPIRATORY_TRACT | Status: DC
  Administered 2017-02-03 – 2017-02-08 (×18): 0.63 mg via RESPIRATORY_TRACT
  Filled 2017-02-03 (×20): qty 3

## 2017-02-03 MED ORDER — QUETIAPINE FUMARATE 25 MG PO TABS
25.0000 mg | ORAL_TABLET | Freq: Every evening | ORAL | Status: DC | PRN
  Administered 2017-02-03 – 2017-02-06 (×4): 25 mg via ORAL
  Filled 2017-02-03 (×4): qty 1

## 2017-02-03 MED ORDER — APIXABAN 5 MG PO TABS
5.0000 mg | ORAL_TABLET | Freq: Two times a day (BID) | ORAL | Status: DC
  Administered 2017-02-03 – 2017-02-08 (×12): 5 mg via ORAL
  Filled 2017-02-03 (×12): qty 1

## 2017-02-03 MED ORDER — METOPROLOL TARTRATE 5 MG/5ML IV SOLN
5.0000 mg | Freq: Once | INTRAVENOUS | Status: AC
  Administered 2017-02-03: 5 mg via INTRAVENOUS
  Filled 2017-02-03: qty 5

## 2017-02-03 MED ORDER — GUAIFENESIN ER 600 MG PO TB12
600.0000 mg | ORAL_TABLET | Freq: Two times a day (BID) | ORAL | Status: DC
  Administered 2017-02-03 – 2017-02-08 (×10): 600 mg via ORAL
  Filled 2017-02-03 (×10): qty 1

## 2017-02-03 MED ORDER — HEPARIN BOLUS VIA INFUSION
2500.0000 [IU] | Freq: Once | INTRAVENOUS | Status: AC
  Administered 2017-02-03: 2500 [IU] via INTRAVENOUS
  Filled 2017-02-03: qty 2500

## 2017-02-03 NOTE — Progress Notes (Signed)
Initial Nutrition Assessment  DOCUMENTATION CODES:   Not applicable  INTERVENTION:   - Discontinue Ensure Enlive BID  - Magic cup daily, each cup provides 290 kcals and 9 grams of protein  - Afternoon snack to increase nutrition status  NUTRITION DIAGNOSIS:   Inadequate oral intake related to acute illness(acute on chronic RF) as evidenced by energy intake < or equal to 50% for > or equal to 5 days, per patient/family report  GOAL:   Patient will meet greater than or equal to 90% of their needs  MONITOR:   PO intake, Supplement acceptance, Labs, Weight trends  REASON FOR ASSESSMENT:   Malnutrition Screening Tool    ASSESSMENT:   Pt is a 76 year old male with PMH of Bell's palsy, bladder cancer, COPD, diverticulitis, BPH, OSA, restless leg syndrome, PAD, HTN, hiatal hernia, colonic polyps, bronchitis/COPD. Pt presented with increasing SOB on 11/12 r/t acute on chronic RF. Pt is being treated for Afib with RVR.  Pt reports a decreased appetite the past 4-5 days and only being able to tolerate small portions of soup and applesauce d/t SOB and fatigue. Prior to that, pt had a good appetite and ate 3 meals per day- breakfast: banana and cereal, lunch: chicken sandwich and cup of milk, dinner: baked chicken with various veggies and a starch. He snacks in the afternoon on peanut butter crackers. Pt reports eating half of his breakfast this morning and most of his chicken and mashed potatoes at lunch. At time of visit, pt was eating ice cream.   Pt reports drinking an Ensure this morning that caused him to vomit. He does not want to try any other nutritional shakes. He denies n/v prior to this episode. Will order pt peanut butter crackers as an afternoon snack and magic cup as a bedtime snack.  Pt states that he has lost 12 lbs in the past week and endorses a UBW of 200 lbs. Per chart, pt weighed 193 lbs on 11/13 and 198 lbs on 10/18. His weight has varied between 195-200 lbs in the  last year.  Medications reviewed. Labs reviewed and include Glucose 182 (H), BUN 23 (H)  NUTRITION - FOCUSED PHYSICAL EXAM:    Most Recent Value  Orbital Region  No depletion  Upper Arm Region  No depletion  Thoracic and Lumbar Region  No depletion  Buccal Region  No depletion  Temple Region  Mild depletion  Clavicle Bone Region  No depletion  Clavicle and Acromion Bone Region  No depletion  Scapular Bone Region  No depletion  Dorsal Hand  No depletion  Patellar Region  No depletion  Anterior Thigh Region  Mild depletion  Posterior Calf Region  Mild depletion  Edema (RD Assessment)  None  Hair  Reviewed  Eyes  Reviewed  Mouth  Reviewed  Skin  Reviewed  Nails  Reviewed       Diet Order:  Diet Heart Room service appropriate? Yes; Fluid consistency: Thin  EDUCATION NEEDS:   No education needs have been identified at this time  Skin:  Skin Assessment: Reviewed RN Assessment  Last BM:  11/11  Height:   Ht Readings from Last 1 Encounters:  02/02/17 5\' 11"  (1.803 m)    Weight:   Wt Readings from Last 1 Encounters:  02/03/17 193 lb 2 oz (87.6 kg)    Ideal Body Weight:  78.2 kg  BMI:  Body mass index is 26.94 kg/m.  Estimated Nutritional Needs:   Kcal:  2000-2200 kcals  Protein:  100-110 grams  Fluid:  >/= 2 L    [place] Dietetic Intern Pager: [phone] 02/03/2017 4:05 PM

## 2017-02-03 NOTE — Progress Notes (Signed)
   Contacted regarding the patient's elevated HR, sustaining in the 150's. It appears this has been ongoing throughout the day and his IV Cardizem was titrated to 20 mg/hr. Notes mention possible TEE-guided DCCV prior to discharge. Will order IV Lopressor for one dose. If this assists with HR, consider addition of cardioselective BB in the setting of his persistent tachycardia. Will switch Albuterol to Xopenex to avoid rebound tachycardia.   Signed, Erma Heritage, PA-C 02/03/2017, 7:34 PM Pager: [phone]

## 2017-02-03 NOTE — Progress Notes (Signed)
Progress Note  Patient Name: Derrick Moon Date of Encounter: 02/03/2017  Primary Cardiologist: Dr. Bronson Ing  Subjective   Pt tolerating RVR, but states he is feeling more weak today and has not been able to rest secondary to telemetry alarms.  Inpatient Medications    Scheduled Meds: . aspirin EC  81 mg Oral BID  . feeding supplement (ENSURE ENLIVE)  237 mL Oral BID BM  . ipratropium-albuterol  3 mL Nebulization Q6H  . irbesartan  150 mg Oral Daily  . methylPREDNISolone (SOLU-MEDROL) injection  125 mg Intravenous Q8H  . tamsulosin  0.4 mg Oral BID   Continuous Infusions: . sodium chloride 75 mL/hr at 02/03/17 0646  . diltiazem (CARDIZEM) infusion 15 mg/hr (02/03/17 0302)  . heparin 1,600 Units/hr (02/03/17 0302)  . levofloxacin (LEVAQUIN) IV     PRN Meds: acetaminophen **OR** acetaminophen, ondansetron **OR** ondansetron (ZOFRAN) IV   Vital Signs    Vitals:   02/03/17 0355 02/03/17 0458 02/03/17 0558 02/03/17 0750  BP:  133/79 111/80 114/61  Pulse:  72 (!) 44 93  Resp:  (!) 21 (!) 28 (!) 27  Temp:    97.6 F (36.4 C)  TempSrc:    Oral  SpO2: 93% 94% 92% 95%  Weight:      Height:        Intake/Output Summary (Last 24 hours) at 02/03/2017 0828 Last data filed at 02/03/2017 8676 Gross per 24 hour  Intake 2423.4 ml  Output 375 ml  Net 2048.4 ml   Filed Weights   02/02/17 1230 02/03/17 0311  Weight: 189 lb (85.7 kg) 193 lb 2 oz (87.6 kg)     Physical Exam   General: Well developed, well nourished, male appearing in no acute distress. Head: Normocephalic, atraumatic.  Neck: Supple without bruits, JVD Lungs:  Course sounds throughout with wheezes and rhonchi Heart: Irregular rhythm, tachycardic rate Abdomen: Soft, non-tender, non-distended with normoactive bowel sounds. No hepatomegaly. No rebound/guarding. No obvious abdominal masses. Extremities: No clubbing, cyanosis, no edema. Distal pedal pulses are 2+ bilaterally. Neuro: Alert and oriented X  3. Moves all extremities spontaneously. Psych: Normal affect.  Labs    Chemistry Recent Labs  Lab 02/02/17 1245  NA 139  K 3.7  CL 103  CO2 25  GLUCOSE 146*  BUN 20  CREATININE 1.47*  CALCIUM 9.3  GFRNONAA 45*  GFRAA 52*  ANIONGAP 11     Hematology Recent Labs  Lab 02/02/17 1245  WBC 24.7*  RBC 5.00  HGB 14.9  HCT 45.9  MCV 91.8  MCH 29.8  MCHC 32.5  RDW 15.8*  PLT 309    Cardiac EnzymesNo results for input(s): TROPONINI in the last 168 hours.  Recent Labs  Lab 02/02/17 1246  TROPIPOC 0.06     BNPNo results for input(s): BNP, PROBNP in the last 168 hours.   DDimer No results for input(s): DDIMER in the last 168 hours.   Radiology    Ct Angio Chest Pe W And/or Wo Contrast  Result Date: 02/02/2017 CLINICAL DATA:  76 year old male with history of shortness of breath and weakness. EXAM: CT ANGIOGRAPHY CHEST WITH CONTRAST TECHNIQUE: Multidetector CT imaging of the chest was performed using the standard protocol during bolus administration of intravenous contrast. Multiplanar CT image reconstructions and MIPs were obtained to evaluate the vascular anatomy. CONTRAST:  17mL ISOVUE-370 IOPAMIDOL (ISOVUE-370) INJECTION 76% COMPARISON:  Chest CT 06/29/2008. FINDINGS: Cardiovascular: Study is limited by considerable respiratory motion such that subsegmental and distal segmental sized emboli cannot  be completely excluded in certain portions of the lungs, most notably in the lower lobes of the lungs bilaterally. With these limitations in mind, there is no central, lobar or proximal segmental sized filling defect to suggest clinically relevant pulmonary embolism. Heart size is borderline enlarged. There is no significant pericardial fluid, thickening or pericardial calcification. There is aortic atherosclerosis, as well as atherosclerosis of the great vessels of the mediastinum and the coronary arteries, including calcified atherosclerotic plaque in the left anterior descending  and right coronary arteries. Mediastinum/Nodes: Multiple borderline enlarged and enlarged mediastinal and bilateral hilar lymph nodes are noted, with the largest of these measuring up to 1.9 cm in the right infrahilar region, and up to 1.3 cm in the low right paratracheal nodal station. Esophagus is unremarkable in appearance. No axillary lymphadenopathy. Lungs/Pleura: Diffuse bronchial wall thickening with scattered areas of more severe thickening of the peribronchovascular interstitium with some mild cylindrical bronchiectasis and extensive regions of mucoid impaction, most evident in the lower lobes of the lungs bilaterally. These areas of greatest involvement also demonstrate associated peripheral peribronchovascular septal thickening and architectural distortion which is poorly evaluated on today's motion limited examination. There is also a background of mild centrilobular and paraseptal emphysema. No pleural effusions. Upper Abdomen: Aortic atherosclerosis. Status post cholecystectomy. Diffuse low attenuation throughout the visualized hepatic parenchyma, indicative of hepatic steatosis. Diffuse low-attenuation enlargement (-2 HU) of the left adrenal gland, indicative of adenomatous hyperplasia, similar in retrospect to prior study from 2010. Musculoskeletal: There are no aggressive appearing lytic or blastic lesions noted in the visualized portions of the skeleton. Review of the MIP images confirms the above findings. IMPRESSION: 1. Despite the limitations of today's examination there is no evidence to suggest central, lobar or proximal segmental sized pulmonary embolism. More distal segmental and subsegmental sized emboli cannot be entirely excluded, particularly in the lower lobes of the lungs bilaterally. 2. However, despite the limitations of today's examination there is a plausible explanation for the patient shortness of breath. Specifically, there is evidence of probable chronic atypical infection, as  discussed above. Alternatively, findings could reflect a severe acute bronchitis with early developing bronchopneumonia most evident in the right lower lobe. Further evaluation by pulmonology is recommended in the near future. 3. Extensive mediastinal and bilateral hilar lymphadenopathy. This could be reactive in the setting of chronic atypical infection, or could be indicative of underlying lymphoproliferative disorder. Alternative diagnosis such as sarcoidosis could also be considered, but is not strongly favored at this time. 4. Aortic atherosclerosis, in addition to 2 vessel coronary artery disease. Assessment for potential risk factor modification, dietary therapy or pharmacologic therapy may be warranted, if clinically indicated. 5. Mild centrilobular and paraseptal emphysema. 6. Hepatic steatosis. 7. Additional incidental findings, as above. Aortic Atherosclerosis (ICD10-I70.0) and Emphysema (ICD10-J43.9). Electronically Signed   By: Vinnie Langton M.D.   On: 02/02/2017 14:52   Dg Chest Portable 1 View  Result Date: 02/02/2017 CLINICAL DATA:  Short of breath EXAM: PORTABLE CHEST 1 VIEW COMPARISON:  12/12/2016 FINDINGS: Interval development of patchy bibasilar airspace disease. Lungs were clear previously. COPD with hyperinflation. Negative for heart failure or effusion. Right shoulder replacement IMPRESSION: COPD.  Interval development of bibasilar atelectasis/pneumonia. Electronically Signed   By: Franchot Gallo M.D.   On: 02/02/2017 13:39     Telemetry    Afib RVR with rates as high as 170s - Personally Reviewed  ECG    No new tracings - Personally Reviewed   Cardiac Studies   Echo 02/03/17: pending  Myoview 08/15/15:  No diagnostic ST segment changes to indicate ischemia.  Small, mild intensity, partially reversible mid apical inferoseptal defect. Possibly associated with variable soft tissue attenuation, however mild region of ischemia is not excluded.  This is a low risk  study.  Nuclear stress EF: 66%.  Patient Profile     76 y.o. male with a hx of HTN, COPD, AAA, PAD, who is being seen atrial fibrillation with RVR.  Assessment & Plan    1. Acute on chronic respiratory failure - pat remains on supplemental O2 - likely COPD exacerbation and PNA - leukocytosis 24.7 - per primary team  2. Atrial fibrillation with RVR - cardizem drip running at 15 mg/hr and he remains in the 140s - on tele range 115-150s - suspect his heart rate will improve as his respiratory status and infection improves - continue heparin drip for anticoagulation: This patients CHA2DS2-VASc Score and unadjusted Ischemic Stroke Rate (% per year) is equal to 4.8 % stroke rate/year from a score of 4 (HTN, vascular, age). Pt is becoming more weak. HR spiked in the 170s while I was in the room. Will discuss with attending possible TEE/DCCV tomorrow. Will discuss with attending possible esmolol drip for rate control.  3. AKI - sCr 1.47, baseline is 0.99-1.00 - encouraged PO intake  4. HTN - continue home irbesartan - pressures have been well-controlled  5. OSA - recommended CPAP while inpatient - he states he keeps "forgetting to breathe at night"   Signed, Ledora Bottcher , PA-C 8:28 AM 02/03/2017 Pager: [phone]  History and all data above reviewed.  Patient examined.  I agree with the findings as above.  He had a rough night and could not sleep because of alarms.  HR has been elevated.    The patient exam reveals MCN:[id number]  ,  Lungs: Decreased breath sounds  ,  Abd: Positive bowel sounds, no rebound no guarding, Ext No edema.  All available labs, radiology testing, previous records reviewed. Agree with documented assessment and plan. Atrial fib:  Rate is elevated but I think this is driven by an acute pulmonary process rather than atrial fib causing the SOB.  He needs continued rate control with IV Cardizem for now but the most important rate control will be improving  the acute pulmonary process.  I will switch to Eliquis.  I will not plan DCCV/TEE today as this would be more difficult until his breathing is better with higher chance of failure.  If however, his pulmonary process improves in the next 48 hours and he is still in atrial fib rapid rate we would plan TEE/DCCV before he is discharged.    Jeneen Rinks Lizett Chowning  10:31 AM  02/03/2017

## 2017-02-03 NOTE — Progress Notes (Signed)
PROGRESS NOTE    Derrick Moon  GLO:[id number] DOB: 07/31/1940 DOA: 02/02/2017 PCP: [name], MD   Chief Complaint  Patient presents with  . Shortness of Breath  . Atrial Fibrillation    Brief Narrative:  HPI on 02/02/2017 by Dr. Linna Darner Derrick Moon is a 76 y.o. male with medical history significant of Bell's palsy, bladder cancer, COPD, diverticulitis, BPH, OSA, restless leg syndrome, PAD, hypertension, hiatal hernia, colonic polyps, bronchitis/COPD.  Patient reports approximately 5-6 day history of increasing work of breathing with intermittent productive cough and subjective fevers. Denies any chest pain, palpitations, nausea, vomiting, abdominal pain, dysuria, frequency, neck stiffness, focal neurological deficit. Patient endorses using intermittent home breathing treatments with minimal improvement. Patient states that he was at the point of no longer being able to handle the symptoms which is what brought him into his pulmonologist, Dr. Melvyn Novas. The pulmonologists office patient was noted to be significantly tachycardic, with O2 saturations in the mid 80s, and in atrial fibrillation and extremities and was sent emergency room via EMS.  Assessment & Plan   Severe sepsis secondary to respiratory infection -Presented with leukocytosis, tachycardia, tachypnea with hypoxemia and hypotension, and AKI -Treatment plan as below -Continue Levaquin -Blood cultures pending  Acute on chronic respiratory failure with hypoxemia -Likely from purulent tracheobronchitis (per Dr. Gustavus Bryant outpatient note) -Seen by pulmonologist on day of admission -Continue Solu-Medrol, nebulizer treatments, Mucinex, Levaquin, spiriva -Sputum culture pending -respiratory viral panel unremarkable   Atrial fibrillation with RVR, new -Heart rate running between 150s to 170s -Currently on Cardizem drip, rate increased by cardiology to 20 -Echocardiogram pending -Cardiology consulted and appreciated -Was  started on heparin however transitioned to Eliquis  Acute kidney injury -Creatinine at baseline 0.99, upon admission 1.47 -Currently creatinine 1.15 -Continue monitor BMP  Hypertension -Continue Avapro  BPH -Continue Flomax  Tobacco abuse -Patient smokes 3-4 cigars per day however last use was 10 days prior to admission -Discussed smoking cessation  DVT Prophylaxis  Eliquis  Code Status: Full  Family Communication: None at bedside  Disposition Plan: Admitted, continue to monitor in stepdown  Consultants Cardiology  Procedures  None  Antibiotics   Anti-infectives (From admission, onward)   Start     Dose/Rate Route Frequency Ordered Stop   02/03/17 1500  levofloxacin (LEVAQUIN) IVPB 500 mg     500 mg 100 mL/hr over 60 Minutes Intravenous Every 24 hours 02/02/17 1806     02/02/17 1345  levofloxacin (LEVAQUIN) IVPB 750 mg     750 mg 100 mL/hr over 90 Minutes Intravenous  Once 02/02/17 1333 02/02/17 1519      Subjective:   Derrick Moon seen and examined today.  Feels weak and continues to have shortness of breath and cough. Denies chest pain, abdominal pain, N/VD/C, dizziness, headache.   Objective:   Vitals:   02/03/17 1000 02/03/17 1158 02/03/17 1232 02/03/17 1300  BP: 119/61 134/68  129/70  Pulse: (!) 104 (!) 108    Resp: (!) 28 (!) 21  (!) 26  Temp:      TempSrc:  Oral Oral   SpO2: 94%   94%  Weight:      Height:        Intake/Output Summary (Last 24 hours) at 02/03/2017 1556 Last data filed at 02/03/2017 1339 Gross per 24 hour  Intake 3707.95 ml  Output 875 ml  Net 2832.95 ml   Filed Weights   02/02/17 1230 02/03/17 0311  Weight: 85.7 kg (189 lb) 87.6 kg (193 lb  2 oz)    Exam  General: Well developed, well nourished, NAD, appears stated age  35: NCAT, mucous membranes moist.   Cardiovascular: S1 S2 auscultated, irregularly irregular  Respiratory: Coarse breath sounds, +wheezing and rhonchi  Abdomen: Soft, nontender, nondistended,  + bowel sounds  Extremities: warm dry without cyanosis clubbing or edema  Neuro: AAOx3, nonfocal  Psych: Appropriate mood and aspect, pleasant    Data Reviewed: I have personally reviewed following labs and imaging studies  CBC: Recent Labs  Lab 02/02/17 1245 02/03/17 0837  WBC 24.7* 20.6*  HGB 14.9 14.6  HCT 45.9 44.6  MCV 91.8 91.4  PLT 309 409   Basic Metabolic Panel: Recent Labs  Lab 02/02/17 1245 02/03/17 0837  NA 139 139  K 3.7 4.0  CL 103 107  CO2 25 23  GLUCOSE 146* 182*  BUN 20 23*  CREATININE 1.47* 1.15  CALCIUM 9.3 9.4   GFR: Estimated Creatinine Clearance: 58.2 mL/min (by C-G formula based on SCr of 1.15 mg/dL). Liver Function Tests: Recent Labs  Lab 02/03/17 0837  AST 56*  ALT 51  ALKPHOS 106  BILITOT 0.7  PROT 6.2*  ALBUMIN 2.6*   No results for input(s): LIPASE, AMYLASE in the last 168 hours. No results for input(s): AMMONIA in the last 168 hours. Coagulation Profile: No results for input(s): INR, PROTIME in the last 168 hours. Cardiac Enzymes: No results for input(s): CKTOTAL, CKMB, CKMBINDEX, TROPONINI in the last 168 hours. BNP (last 3 results) No results for input(s): PROBNP in the last 8760 hours. HbA1C: No results for input(s): HGBA1C in the last 72 hours. CBG: No results for input(s): GLUCAP in the last 168 hours. Lipid Profile: No results for input(s): CHOL, HDL, LDLCALC, TRIG, CHOLHDL, LDLDIRECT in the last 72 hours. Thyroid Function Tests: No results for input(s): TSH, T4TOTAL, FREET4, T3FREE, THYROIDAB in the last 72 hours. Anemia Panel: No results for input(s): VITAMINB12, FOLATE, FERRITIN, TIBC, IRON, RETICCTPCT in the last 72 hours. Urine analysis:    Component Value Date/Time   COLORURINE YELLOW 09/14/2015 [place] 09/14/2015 0934   LABSPEC 1.028 09/14/2015 0934   PHURINE 5.0 09/14/2015 0934   GLUCOSEU NEGATIVE 09/14/2015 0934   HGBUR NEGATIVE 09/14/2015 0934   BILIRUBINUR NEGATIVE 09/14/2015  0934   KETONESUR NEGATIVE 09/14/2015 0934   PROTEINUR NEGATIVE 09/14/2015 0934   UROBILINOGEN 0.2 10/06/2008 0800   NITRITE NEGATIVE 09/14/2015 0934   LEUKOCYTESUR NEGATIVE 09/14/2015 0934   Sepsis Labs: @LABRCNTIP (procalcitonin:4,lacticidven:4)  ) Recent Results (from the past 240 hour(s))  Respiratory Panel by PCR     Status: None   Collection Time: 02/02/17  5:26 PM  Result Value Ref Range Status   Adenovirus NOT DETECTED NOT DETECTED Final   Coronavirus 229E NOT DETECTED NOT DETECTED Final   Coronavirus HKU1 NOT DETECTED NOT DETECTED Final   Coronavirus NL63 NOT DETECTED NOT DETECTED Final   Coronavirus OC43 NOT DETECTED NOT DETECTED Final   Metapneumovirus NOT DETECTED NOT DETECTED Final   Rhinovirus / Enterovirus NOT DETECTED NOT DETECTED Final   Influenza A NOT DETECTED NOT DETECTED Final   Influenza B NOT DETECTED NOT DETECTED Final   Parainfluenza Virus 1 NOT DETECTED NOT DETECTED Final   Parainfluenza Virus 2 NOT DETECTED NOT DETECTED Final   Parainfluenza Virus 3 NOT DETECTED NOT DETECTED Final   Parainfluenza Virus 4 NOT DETECTED NOT DETECTED Final   Respiratory Syncytial Virus NOT DETECTED NOT DETECTED Final   Bordetella pertussis NOT DETECTED NOT DETECTED Final   Chlamydophila  pneumoniae NOT DETECTED NOT DETECTED Final   Mycoplasma pneumoniae NOT DETECTED NOT DETECTED Final  MRSA PCR Screening     Status: None   Collection Time: 02/02/17  9:52 PM  Result Value Ref Range Status   MRSA by PCR NEGATIVE NEGATIVE Final    Comment:        The GeneXpert MRSA Assay (FDA approved for NASAL specimens only), is one component of a comprehensive MRSA colonization surveillance program. It is not intended to diagnose MRSA infection nor to guide or monitor treatment for MRSA infections.       Radiology Studies: Ct Angio Chest Pe W And/or Wo Contrast  Result Date: 02/02/2017 CLINICAL DATA:  76 year old male with history of shortness of breath and weakness. EXAM: CT  ANGIOGRAPHY CHEST WITH CONTRAST TECHNIQUE: Multidetector CT imaging of the chest was performed using the standard protocol during bolus administration of intravenous contrast. Multiplanar CT image reconstructions and MIPs were obtained to evaluate the vascular anatomy. CONTRAST:  143mL ISOVUE-370 IOPAMIDOL (ISOVUE-370) INJECTION 76% COMPARISON:  Chest CT 06/29/2008. FINDINGS: Cardiovascular: Study is limited by considerable respiratory motion such that subsegmental and distal segmental sized emboli cannot be completely excluded in certain portions of the lungs, most notably in the lower lobes of the lungs bilaterally. With these limitations in mind, there is no central, lobar or proximal segmental sized filling defect to suggest clinically relevant pulmonary embolism. Heart size is borderline enlarged. There is no significant pericardial fluid, thickening or pericardial calcification. There is aortic atherosclerosis, as well as atherosclerosis of the great vessels of the mediastinum and the coronary arteries, including calcified atherosclerotic plaque in the left anterior descending and right coronary arteries. Mediastinum/Nodes: Multiple borderline enlarged and enlarged mediastinal and bilateral hilar lymph nodes are noted, with the largest of these measuring up to 1.9 cm in the right infrahilar region, and up to 1.3 cm in the low right paratracheal nodal station. Esophagus is unremarkable in appearance. No axillary lymphadenopathy. Lungs/Pleura: Diffuse bronchial wall thickening with scattered areas of more severe thickening of the peribronchovascular interstitium with some mild cylindrical bronchiectasis and extensive regions of mucoid impaction, most evident in the lower lobes of the lungs bilaterally. These areas of greatest involvement also demonstrate associated peripheral peribronchovascular septal thickening and architectural distortion which is poorly evaluated on today's motion limited examination. There  is also a background of mild centrilobular and paraseptal emphysema. No pleural effusions. Upper Abdomen: Aortic atherosclerosis. Status post cholecystectomy. Diffuse low attenuation throughout the visualized hepatic parenchyma, indicative of hepatic steatosis. Diffuse low-attenuation enlargement (-2 HU) of the left adrenal gland, indicative of adenomatous hyperplasia, similar in retrospect to prior study from 2010. Musculoskeletal: There are no aggressive appearing lytic or blastic lesions noted in the visualized portions of the skeleton. Review of the MIP images confirms the above findings. IMPRESSION: 1. Despite the limitations of today's examination there is no evidence to suggest central, lobar or proximal segmental sized pulmonary embolism. More distal segmental and subsegmental sized emboli cannot be entirely excluded, particularly in the lower lobes of the lungs bilaterally. 2. However, despite the limitations of today's examination there is a plausible explanation for the patient shortness of breath. Specifically, there is evidence of probable chronic atypical infection, as discussed above. Alternatively, findings could reflect a severe acute bronchitis with early developing bronchopneumonia most evident in the right lower lobe. Further evaluation by pulmonology is recommended in the near future. 3. Extensive mediastinal and bilateral hilar lymphadenopathy. This could be reactive in the setting of chronic atypical infection, or  could be indicative of underlying lymphoproliferative disorder. Alternative diagnosis such as sarcoidosis could also be considered, but is not strongly favored at this time. 4. Aortic atherosclerosis, in addition to 2 vessel coronary artery disease. Assessment for potential risk factor modification, dietary therapy or pharmacologic therapy may be warranted, if clinically indicated. 5. Mild centrilobular and paraseptal emphysema. 6. Hepatic steatosis. 7. Additional incidental  findings, as above. Aortic Atherosclerosis (ICD10-I70.0) and Emphysema (ICD10-J43.9). Electronically Signed   By: Vinnie Langton M.D.   On: 02/02/2017 14:52   Dg Chest Portable 1 View  Result Date: 02/02/2017 CLINICAL DATA:  Short of breath EXAM: PORTABLE CHEST 1 VIEW COMPARISON:  12/12/2016 FINDINGS: Interval development of patchy bibasilar airspace disease. Lungs were clear previously. COPD with hyperinflation. Negative for heart failure or effusion. Right shoulder replacement IMPRESSION: COPD.  Interval development of bibasilar atelectasis/pneumonia. Electronically Signed   By: Franchot Gallo M.D.   On: 02/02/2017 13:39     Scheduled Meds: . apixaban  5 mg Oral BID  . aspirin EC  81 mg Oral BID  . feeding supplement (ENSURE ENLIVE)  237 mL Oral BID BM  . ipratropium-albuterol  3 mL Nebulization Q6H  . irbesartan  150 mg Oral Daily  . methylPREDNISolone (SOLU-MEDROL) injection  125 mg Intravenous Q8H  . tamsulosin  0.4 mg Oral BID   Continuous Infusions: . sodium chloride 10 mL/hr at 02/03/17 0843  . diltiazem (CARDIZEM) infusion 20 mg/hr (02/03/17 1104)  . levofloxacin (LEVAQUIN) IV 500 mg (02/03/17 1418)     LOS: 1 day   Time Spent in minutes   45 minutes  Priscille Shadduck D.O. on 02/03/2017 at 3:56 PM  Between 7am to 7pm - Pager - [phone]  After 7pm go to [email or web address] - password [email or web address]  And look for the night coverage person covering for me after hours  [facility] Office  [phone]

## 2017-02-03 NOTE — Progress Notes (Addendum)
[place] for Heparin Indication: atrial fibrillation  Allergies  Allergen Reactions  . Aspartame And Phenylalanine Nausea Only  . Other Rash    Pt reports rash on back, buttocks, shoulders and stomach after arteriogram  . Sulfonamide Derivatives Nausea And Vomiting    Patient Measurements: Height: 5\' 11"  (180.3 cm) Weight: 193 lb 2 oz (87.6 kg) IBW/kg (Calculated) : 75.3  Vital Signs: Temp: 97.6 F (36.4 C) (11/13 0750) Temp Source: Oral (11/13 0750) BP: 120/83 (11/13 0800) Pulse Rate: 80 (11/13 0800)  Labs: Recent Labs    02/02/17 1245 02/02/17 2148 02/03/17 0837  HGB 14.9  --  14.6  HCT 45.9  --  44.6  PLT 309  --  369  HEPARINUNFRC  --  <0.10* 0.10*  CREATININE 1.47*  --   --     Estimated Creatinine Clearance: 45.5 mL/min (A) (by C-G formula based on SCr of 1.47 mg/dL (H)).  Assessment: CC/HPI: 76 yo m presenting with afib in RVR  PMH: HTN, COPD, AAA, PAD  Anticoag: none pta. Hep gtt for new onset afib. HL 0.1 on 1600 units/hr  Renal: SCr 1.47  Heme/Onc: H&H 14.6/44.6, Plt 369  Goal of Therapy:  Heparin level 0.3-0.7 units/ml Monitor platelets by anticoagulation protocol: Yes   Plan:  [place] hep 2500 units x 1 Increase hep gtt to 1800 units/hr 1800 HL Daily HL, CBC F/U plans for [facility]  Levester Fresh, PharmD, BCPS, BCCCP Clinical Pharmacist Clinical phone for 02/03/2017 from 7a-3:30p: [phone] If after 3:30p, please call main pharmacy at: [phone] 02/03/2017 9:28 AM _____________________________________________________________________________ Addendum:  Heparin gtt dc to convert to apixaban  Will initiate apixaban 5 mg bid once hep gtt is stopped

## 2017-02-04 ENCOUNTER — Inpatient Hospital Stay (HOSPITAL_COMMUNITY): Payer: PPO

## 2017-02-04 DIAGNOSIS — I4891 Unspecified atrial fibrillation: Secondary | ICD-10-CM

## 2017-02-04 LAB — ECHOCARDIOGRAM COMPLETE
Area-P 1/2: 4.4 cm2
E decel time: 194 msec
FS: 26 % — AB (ref 28–44)
HEIGHTINCHES: 71 in
IVS/LV PW RATIO, ED: 0.91
LA diam end sys: 33 mm
LA diam index: 1.57 cm/m2
LA vol A4C: 82.9 ml
LA vol: 92.3 mL
LASIZE: 33 mm
LAVOLIN: 43.8 mL/m2
LDCA: 2.84 cm2
LVOT VTI: 25.8 cm
LVOT diameter: 19 mm
LVOT peak grad rest: 10 mmHg
LVOTPV: 158 cm/s
LVOTSV: 73 mL
MV Dec: 194
MV Peak grad: 4 mmHg
MV pk A vel: 48 m/s
MVPKEVEL: 94.1 m/s
P 1/2 time: 57 ms
PW: 11 mm — AB (ref 0.6–1.1)
RV TAPSE: 23.9 mm
WEIGHTICAEL: 3089.97 [oz_av]

## 2017-02-04 LAB — CBC
HEMATOCRIT: 42.4 % (ref 39.0–52.0)
HEMOGLOBIN: 13.4 g/dL (ref 13.0–17.0)
MCH: 29.1 pg (ref 26.0–34.0)
MCHC: 31.6 g/dL (ref 30.0–36.0)
MCV: 92.2 fL (ref 78.0–100.0)
Platelets: 419 10*3/uL — ABNORMAL HIGH (ref 150–400)
RBC: 4.6 MIL/uL (ref 4.22–5.81)
RDW: 16 % — ABNORMAL HIGH (ref 11.5–15.5)
WBC: 25.4 10*3/uL — AB (ref 4.0–10.5)

## 2017-02-04 LAB — BASIC METABOLIC PANEL
ANION GAP: 8 (ref 5–15)
BUN: 34 mg/dL — ABNORMAL HIGH (ref 6–20)
CHLORIDE: 108 mmol/L (ref 101–111)
CO2: 25 mmol/L (ref 22–32)
Calcium: 9.7 mg/dL (ref 8.9–10.3)
Creatinine, Ser: 1.12 mg/dL (ref 0.61–1.24)
GFR calc non Af Amer: >60 mL/min (ref >60)
Glucose, Bld: 158 mg/dL — ABNORMAL HIGH (ref 65–99)
Potassium: 4 mmol/L (ref 3.5–5.1)
Sodium: 141 mmol/L (ref 135–145)

## 2017-02-04 MED ORDER — METOPROLOL TARTRATE 5 MG/5ML IV SOLN
5.0000 mg | Freq: Once | INTRAVENOUS | Status: AC
  Administered 2017-02-04: 5 mg via INTRAVENOUS

## 2017-02-04 MED ORDER — PERFLUTREN LIPID MICROSPHERE
1.0000 mL | INTRAVENOUS | Status: AC | PRN
  Administered 2017-02-04: 1.5 mL via INTRAVENOUS
  Filled 2017-02-04: qty 10

## 2017-02-04 MED ORDER — BISACODYL 5 MG PO TBEC
5.0000 mg | DELAYED_RELEASE_TABLET | Freq: Every day | ORAL | Status: DC | PRN
  Administered 2017-02-04: 5 mg via ORAL
  Filled 2017-02-04: qty 1

## 2017-02-04 MED ORDER — METOPROLOL TARTRATE 5 MG/5ML IV SOLN
INTRAVENOUS | Status: AC
  Filled 2017-02-04: qty 5

## 2017-02-04 NOTE — Progress Notes (Signed)
PROGRESS NOTE    Derrick Moon  GGY:[id number] DOB: 1940/05/22 DOA: 02/02/2017 PCP: [name], MD   Chief Complaint  Patient presents with  . Shortness of Breath  . Atrial Fibrillation    Brief Narrative:  HPI on 02/02/2017 by Dr. Linna Darner Derrick Moon is a 76 y.o. male with medical history significant of Bell's palsy, bladder cancer, COPD, diverticulitis, BPH, OSA, restless leg syndrome, PAD, hypertension, hiatal hernia, colonic polyps, bronchitis/COPD.  Patient reports approximately 5-6 day history of increasing work of breathing with intermittent productive cough and subjective fevers. Denies any chest pain, palpitations, nausea, vomiting, abdominal pain, dysuria, frequency, neck stiffness, focal neurological deficit. Patient endorses using intermittent home breathing treatments with minimal improvement. Patient states that he was at the point of no longer being able to handle the symptoms which is what brought him into his pulmonologist, Dr. Melvyn Novas. The pulmonologists office patient was noted to be significantly tachycardic, with O2 saturations in the mid 80s, and in atrial fibrillation and extremities and was sent emergency room via EMS.  Assessment & Plan   Severe sepsis secondary to respiratory infection -Presented with leukocytosis, tachycardia, tachypnea with hypoxemia and hypotension, and AKI -Treatment plan as below -Continue Levaquin -Blood cultures pending -suspect increase in leukocytosis due to steroids  Acute on chronic respiratory failure with hypoxemia -Likely from purulent tracheobronchitis (per Dr. Gustavus Bryant outpatient note) -Seen by pulmonologist on day of admission -Continue Solu-Medrol, nebulizer treatments, Mucinex, Levaquin, spiriva -Sputum culture pending -respiratory viral panel unremarkable  -patient feels his breathing has improved   Atrial fibrillation with RVR, new -Heart rate running between 150s to 170s -Currently on Cardizem drip, rate  increased by cardiology to 20 -Echocardiogram pending -Cardiology consulted and appreciated -Was started on heparin however transitioned to Eliquis -has been receiving pushes of IV metoprolol -plan for TEE/DCCV on 02/06/2017  Acute kidney injury -Creatinine at baseline 0.99, upon admission 1.47 -Currently creatinine 1.12 -Continue monitor BMP  Hypertension -Continue Avapro  BPH -Continue Flomax  Tobacco abuse -Patient smokes 3-4 cigars per day however last use was 10 days prior to admission -Discussed smoking cessation  DVT Prophylaxis  Eliquis  Code Status: Full  Family Communication: None at bedside  Disposition Plan: Admitted, continue to monitor in stepdown  Consultants Cardiology  Procedures  None  Antibiotics   Anti-infectives (From admission, onward)   Start     Dose/Rate Route Frequency Ordered Stop   02/03/17 1500  levofloxacin (LEVAQUIN) IVPB 500 mg     500 mg 100 mL/hr over 60 Minutes Intravenous Every 24 hours 02/02/17 1806     02/02/17 1345  levofloxacin (LEVAQUIN) IVPB 750 mg     750 mg 100 mL/hr over 90 Minutes Intravenous  Once 02/02/17 1333 02/02/17 1519      Subjective:   Luther Delrossi seen and examined today. Feels his breathing has improved since admission. Still has productive cough. Was able to sleep well overnight. Denies current chest pain, abdominal pain, nausea, vomiting, diarrhea, constipation, headache, dizziness.   Objective:   Vitals:   02/04/17 0751 02/04/17 1012 02/04/17 1100 02/04/17 1327  BP: 108/65  105/76   Pulse: (!) 111 (!) 117 (!) 119   Resp: 20 17 (!) 31   Temp: (!) 97.4 F (36.3 C)  99.1 F (37.3 C)   TempSrc: Oral  Axillary   SpO2: 96% 96% 96% 94%  Weight:      Height:        Intake/Output Summary (Last 24 hours) at 02/04/2017 1448 Last  data filed at 02/04/2017 1100 Gross per 24 hour  Intake 3016.25 ml  Output 625 ml  Net 2391.25 ml   Filed Weights   02/02/17 1230 02/03/17 0311  Weight: 85.7 kg (189  lb) 87.6 kg (193 lb 2 oz)   Exam  General: Well developed, well nourished, NAD, appears stated age  55: NCAT, mucous membranes moist.   Cardiovascular: S1 S2 auscultated, irregularly irregular  Respiratory: breath sounds improving, however continues to have wheezing, +cough  Abdomen: Soft, nontender, nondistended, + bowel sounds  Extremities: warm dry without cyanosis clubbing or edema  Neuro: AAOx3, nonfocal  Psych: pleasant, appropriate  Data Reviewed: I have personally reviewed following labs and imaging studies  CBC: Recent Labs  Lab 02/02/17 1245 02/03/17 0837 02/04/17 0344  WBC 24.7* 20.6* 25.4*  HGB 14.9 14.6 13.4  HCT 45.9 44.6 42.4  MCV 91.8 91.4 92.2  PLT 309 369 878*   Basic Metabolic Panel: Recent Labs  Lab 02/02/17 1245 02/03/17 0837 02/04/17 0344  NA 139 139 141  K 3.7 4.0 4.0  CL 103 107 108  CO2 25 23 25   GLUCOSE 146* 182* 158*  BUN 20 23* 34*  CREATININE 1.47* 1.15 1.12  CALCIUM 9.3 9.4 9.7   GFR: Estimated Creatinine Clearance: 59.8 mL/min (by C-G formula based on SCr of 1.12 mg/dL). Liver Function Tests: Recent Labs  Lab 02/03/17 0837  AST 56*  ALT 51  ALKPHOS 106  BILITOT 0.7  PROT 6.2*  ALBUMIN 2.6*   No results for input(s): LIPASE, AMYLASE in the last 168 hours. No results for input(s): AMMONIA in the last 168 hours. Coagulation Profile: No results for input(s): INR, PROTIME in the last 168 hours. Cardiac Enzymes: No results for input(s): CKTOTAL, CKMB, CKMBINDEX, TROPONINI in the last 168 hours. BNP (last 3 results) No results for input(s): PROBNP in the last 8760 hours. HbA1C: No results for input(s): HGBA1C in the last 72 hours. CBG: No results for input(s): GLUCAP in the last 168 hours. Lipid Profile: No results for input(s): CHOL, HDL, LDLCALC, TRIG, CHOLHDL, LDLDIRECT in the last 72 hours. Thyroid Function Tests: No results for input(s): TSH, T4TOTAL, FREET4, T3FREE, THYROIDAB in the last 72 hours. Anemia  Panel: No results for input(s): VITAMINB12, FOLATE, FERRITIN, TIBC, IRON, RETICCTPCT in the last 72 hours. Urine analysis:    Component Value Date/Time   COLORURINE YELLOW 09/14/2015 [place] 09/14/2015 0934   LABSPEC 1.028 09/14/2015 0934   PHURINE 5.0 09/14/2015 0934   GLUCOSEU NEGATIVE 09/14/2015 0934   HGBUR NEGATIVE 09/14/2015 0934   BILIRUBINUR NEGATIVE 09/14/2015 0934   KETONESUR NEGATIVE 09/14/2015 0934   PROTEINUR NEGATIVE 09/14/2015 0934   UROBILINOGEN 0.2 10/06/2008 0800   NITRITE NEGATIVE 09/14/2015 0934   LEUKOCYTESUR NEGATIVE 09/14/2015 0934   Sepsis Labs: @LABRCNTIP (procalcitonin:4,lacticidven:4)  ) Recent Results (from the past 240 hour(s))  Respiratory Panel by PCR     Status: None   Collection Time: 02/02/17  5:26 PM  Result Value Ref Range Status   Adenovirus NOT DETECTED NOT DETECTED Final   Coronavirus 229E NOT DETECTED NOT DETECTED Final   Coronavirus HKU1 NOT DETECTED NOT DETECTED Final   Coronavirus NL63 NOT DETECTED NOT DETECTED Final   Coronavirus OC43 NOT DETECTED NOT DETECTED Final   Metapneumovirus NOT DETECTED NOT DETECTED Final   Rhinovirus / Enterovirus NOT DETECTED NOT DETECTED Final   Influenza A NOT DETECTED NOT DETECTED Final   Influenza B NOT DETECTED NOT DETECTED Final   Parainfluenza Virus 1 NOT DETECTED  NOT DETECTED Final   Parainfluenza Virus 2 NOT DETECTED NOT DETECTED Final   Parainfluenza Virus 3 NOT DETECTED NOT DETECTED Final   Parainfluenza Virus 4 NOT DETECTED NOT DETECTED Final   Respiratory Syncytial Virus NOT DETECTED NOT DETECTED Final   Bordetella pertussis NOT DETECTED NOT DETECTED Final   Chlamydophila pneumoniae NOT DETECTED NOT DETECTED Final   Mycoplasma pneumoniae NOT DETECTED NOT DETECTED Final  MRSA PCR Screening     Status: None   Collection Time: 02/02/17  9:52 PM  Result Value Ref Range Status   MRSA by PCR NEGATIVE NEGATIVE Final    Comment:        The GeneXpert MRSA Assay (FDA approved  for NASAL specimens only), is one component of a comprehensive MRSA colonization surveillance program. It is not intended to diagnose MRSA infection nor to guide or monitor treatment for MRSA infections.       Radiology Studies: No results found.   Scheduled Meds: . apixaban  5 mg Oral BID  . aspirin EC  81 mg Oral BID  . guaiFENesin  600 mg Oral BID  . irbesartan  150 mg Oral Daily  . levalbuterol  0.63 mg Nebulization Q6H  . methylPREDNISolone (SOLU-MEDROL) injection  125 mg Intravenous Q8H  . tamsulosin  0.4 mg Oral BID   Continuous Infusions: . sodium chloride 75 mL/hr at 02/04/17 0250  . diltiazem (CARDIZEM) infusion 20 mg/hr (02/04/17 0957)  . levofloxacin (LEVAQUIN) IV Stopped (02/03/17 1520)     LOS: 2 days   Time Spent in minutes   30 minutes  Rowyn Spilde D.O. on 02/04/2017 at 2:48 PM  Between 7am to 7pm - Pager - [phone]  After 7pm go to [email or web address] - password [email or web address]  And look for the night coverage person covering for me after hours  [facility] Office  [phone]

## 2017-02-04 NOTE — Progress Notes (Signed)
Progress Note  Patient Name: Derrick Moon Date of Encounter: 02/04/2017  Primary Cardiologist:   Dr. Bronson Ing  Subjective   Breathing better but still coughing up green sputum.  He rested last night.   Inpatient Medications    Scheduled Meds: . apixaban  5 mg Oral BID  . aspirin EC  81 mg Oral BID  . guaiFENesin  600 mg Oral BID  . irbesartan  150 mg Oral Daily  . levalbuterol  0.63 mg Nebulization Q6H  . methylPREDNISolone (SOLU-MEDROL) injection  125 mg Intravenous Q8H  . tamsulosin  0.4 mg Oral BID   Continuous Infusions: . sodium chloride 75 mL/hr at 02/04/17 0250  . diltiazem (CARDIZEM) infusion 20 mg/hr (02/04/17 0957)  . levofloxacin (LEVAQUIN) IV Stopped (02/03/17 1520)   PRN Meds: acetaminophen **OR** acetaminophen, ondansetron **OR** ondansetron (ZOFRAN) IV, QUEtiapine, senna-docusate   Vital Signs    Vitals:   02/04/17 0308 02/04/17 0415 02/04/17 0751 02/04/17 1012  BP:  119/70 108/65   Pulse:  (!) 50 (!) 111 (!) 117  Resp:  19 20 17   Temp:  97.9 F (36.6 C) (!) 97.4 F (36.3 C)   TempSrc:  Axillary Oral   SpO2: 95% 96% 96% 96%  Weight:      Height:        Intake/Output Summary (Last 24 hours) at 02/04/2017 1042 Last data filed at 02/04/2017 1000 Gross per 24 hour  Intake 3461.27 ml  Output 825 ml  Net 2636.27 ml   Filed Weights   02/02/17 1230 02/03/17 0311  Weight: 189 lb (85.7 kg) 193 lb 2 oz (87.6 kg)    Telemetry    Atrial fib with rapid rate - Personally Reviewed  ECG    NA - Personally Reviewed  Physical Exam   GEN: No acute distress.   Neck: No  JVD Cardiac: Irregular RR,  murmurs, rubs, or gallops.  Respiratory:   Wheezing and rhonchi with decreased breath sounds but improved.  GI: Soft, nontender, non-distended  MS: No  edema; No deformity. Neuro:  Nonfocal  Psych: Normal affect   Labs    Chemistry Recent Labs  Lab 02/02/17 1245 02/03/17 0837 02/04/17 0344  NA 139 139 141  K 3.7 4.0 4.0  CL 103 107 108   CO2 25 23 25   GLUCOSE 146* 182* 158*  BUN 20 23* 34*  CREATININE 1.47* 1.15 1.12  CALCIUM 9.3 9.4 9.7  PROT  --  6.2*  --   ALBUMIN  --  2.6*  --   AST  --  56*  --   ALT  --  51  --   ALKPHOS  --  106  --   BILITOT  --  0.7  --   GFRNONAA 45* 60* >60  GFRAA 52* >60 >60  ANIONGAP 11 9 8      Hematology Recent Labs  Lab 02/02/17 1245 02/03/17 0837 02/04/17 0344  WBC 24.7* 20.6* 25.4*  RBC 5.00 4.88 4.60  HGB 14.9 14.6 13.4  HCT 45.9 44.6 42.4  MCV 91.8 91.4 92.2  MCH 29.8 29.9 29.1  MCHC 32.5 32.7 31.6  RDW 15.8* 16.1* 16.0*  PLT 309 369 419*    Cardiac EnzymesNo results for input(s): TROPONINI in the last 168 hours.  Recent Labs  Lab 02/02/17 1246  TROPIPOC 0.06     BNPNo results for input(s): BNP, PROBNP in the last 168 hours.   DDimer No results for input(s): DDIMER in the last 168 hours.   Radiology  Ct Angio Chest Pe W And/or Wo Contrast  Result Date: 02/02/2017 CLINICAL DATA:  76 year old male with history of shortness of breath and weakness. EXAM: CT ANGIOGRAPHY CHEST WITH CONTRAST TECHNIQUE: Multidetector CT imaging of the chest was performed using the standard protocol during bolus administration of intravenous contrast. Multiplanar CT image reconstructions and MIPs were obtained to evaluate the vascular anatomy. CONTRAST:  16mL ISOVUE-370 IOPAMIDOL (ISOVUE-370) INJECTION 76% COMPARISON:  Chest CT 06/29/2008. FINDINGS: Cardiovascular: Study is limited by considerable respiratory motion such that subsegmental and distal segmental sized emboli cannot be completely excluded in certain portions of the lungs, most notably in the lower lobes of the lungs bilaterally. With these limitations in mind, there is no central, lobar or proximal segmental sized filling defect to suggest clinically relevant pulmonary embolism. Heart size is borderline enlarged. There is no significant pericardial fluid, thickening or pericardial calcification. There is aortic  atherosclerosis, as well as atherosclerosis of the great vessels of the mediastinum and the coronary arteries, including calcified atherosclerotic plaque in the left anterior descending and right coronary arteries. Mediastinum/Nodes: Multiple borderline enlarged and enlarged mediastinal and bilateral hilar lymph nodes are noted, with the largest of these measuring up to 1.9 cm in the right infrahilar region, and up to 1.3 cm in the low right paratracheal nodal station. Esophagus is unremarkable in appearance. No axillary lymphadenopathy. Lungs/Pleura: Diffuse bronchial wall thickening with scattered areas of more severe thickening of the peribronchovascular interstitium with some mild cylindrical bronchiectasis and extensive regions of mucoid impaction, most evident in the lower lobes of the lungs bilaterally. These areas of greatest involvement also demonstrate associated peripheral peribronchovascular septal thickening and architectural distortion which is poorly evaluated on today's motion limited examination. There is also a background of mild centrilobular and paraseptal emphysema. No pleural effusions. Upper Abdomen: Aortic atherosclerosis. Status post cholecystectomy. Diffuse low attenuation throughout the visualized hepatic parenchyma, indicative of hepatic steatosis. Diffuse low-attenuation enlargement (-2 HU) of the left adrenal gland, indicative of adenomatous hyperplasia, similar in retrospect to prior study from 2010. Musculoskeletal: There are no aggressive appearing lytic or blastic lesions noted in the visualized portions of the skeleton. Review of the MIP images confirms the above findings. IMPRESSION: 1. Despite the limitations of today's examination there is no evidence to suggest central, lobar or proximal segmental sized pulmonary embolism. More distal segmental and subsegmental sized emboli cannot be entirely excluded, particularly in the lower lobes of the lungs bilaterally. 2. However,  despite the limitations of today's examination there is a plausible explanation for the patient shortness of breath. Specifically, there is evidence of probable chronic atypical infection, as discussed above. Alternatively, findings could reflect a severe acute bronchitis with early developing bronchopneumonia most evident in the right lower lobe. Further evaluation by pulmonology is recommended in the near future. 3. Extensive mediastinal and bilateral hilar lymphadenopathy. This could be reactive in the setting of chronic atypical infection, or could be indicative of underlying lymphoproliferative disorder. Alternative diagnosis such as sarcoidosis could also be considered, but is not strongly favored at this time. 4. Aortic atherosclerosis, in addition to 2 vessel coronary artery disease. Assessment for potential risk factor modification, dietary therapy or pharmacologic therapy may be warranted, if clinically indicated. 5. Mild centrilobular and paraseptal emphysema. 6. Hepatic steatosis. 7. Additional incidental findings, as above. Aortic Atherosclerosis (ICD10-I70.0) and Emphysema (ICD10-J43.9). Electronically Signed   By: Vinnie Langton M.D.   On: 02/02/2017 14:52   Dg Chest Portable 1 View  Result Date: 02/02/2017 CLINICAL DATA:  Short of  breath EXAM: PORTABLE CHEST 1 VIEW COMPARISON:  12/12/2016 FINDINGS: Interval development of patchy bibasilar airspace disease. Lungs were clear previously. COPD with hyperinflation. Negative for heart failure or effusion. Right shoulder replacement IMPRESSION: COPD.  Interval development of bibasilar atelectasis/pneumonia. Electronically Signed   By: Franchot Gallo M.D.   On: 02/02/2017 13:39    Cardiac Studies   ECHO pending  Patient Profile     76 y.o. male with a hx of HTN, COPD, AAA, PAD,who is being seen atrial fibrillation with RVR.   Assessment & Plan    Acute on chronic respiratory failure:  Improving.  Continued plan per primary team.    Atrial fib:  Plan TEE/DCCV on Friday provided his respiratory status can handle this.   He is on Eliquis  AKI:  Creat is improved.  Follow.    HTN:   BP well controlled.   OSA:  Used CPAP part of last night.   For questions or updates, please contact [place] Please consult [email or web address] for contact info under Cardiology/STEMI.   Signed, Minus Breeding, MD  02/04/2017, 10:42 AM

## 2017-02-04 NOTE — Consult Note (Signed)
[facility] CM Primary Care Navigator  02/04/2017  Derrick Moon Study 02-28-1941 [id number]   Met withpatient at the bedside to identify possible discharge needs.  Patient reports having increased shortness of breath, productive cough, congestion and easily fatigued. His symptoms minimally improved with home breathing treatments that brought him to his primary care provider who referred him to the pulmonologist (Dr. Melvyn Novas). At the pulmonologist's office, patient was noted to be significantly tachycardic, with oxygen saturations in the mid 80s that resulted to this admission.  Patient endorses Dr. [name] with Dellis Filbert MD office as theprimary care provider.   Patient reportsusing [facility] in [place] to obtain medications without difficulty.   Patient states that he manages his own medications at home straight out of the containers.  Patient verbalized that he was driving prior to admission but wife Derrick Moon) will be able to provide transportation tohisdoctors'appointments after discharge.  Patient's wife is theprimary caregiver at home as stated.   Anticipateddischarge plan ishome once condition improves according to patient. Awaiting physician's order for discharge.  Patient expressedunderstanding to call hisprimary care provider's officewhen he gets home,for a post hospital follow-upwithin a week or sooner if needed.Patient letter (with PCP's contact number) was provided as a reminder.   Discussed withpatientregardingTHN CM services available for health management at home. Explained topatient about COPD action plan as well. Patient had opted andverbally agreed onlytoEMMI COPD calls for follow-up of his recovery since he expressed desire to avoid a previous experience from being overwhelmed and tired with health information from home health that drove him back to smoking cigar.  Referral was made for [facility] COPDcalls after  discharge.  Patient voicedunderstandingto seekreferral from primary care provider to [facility] care management if deemed necessaryfor further services in the future.  [facility] care management information provided for future needs that he may have.   For additional questions please contact:  Edwena Felty A. Nihar Klus, BSN, RN-BC [facility] PRIMARY CARE Navigator Cell: [phone]

## 2017-02-04 NOTE — Progress Notes (Signed)
Pt on CPAP with 4lpm oxygen in place.  Sats 93% and tolerating well at this time

## 2017-02-04 NOTE — Progress Notes (Signed)
  Echocardiogram 2D Echocardiogram has been performed.  Derrick Moon 02/04/2017, 3:08 PM

## 2017-02-05 LAB — MAGNESIUM: Magnesium: 2.3 mg/dL (ref 1.7–2.4)

## 2017-02-05 LAB — CBC
HCT: 42.3 % (ref 39.0–52.0)
HEMOGLOBIN: 13.3 g/dL (ref 13.0–17.0)
MCH: 28.9 pg (ref 26.0–34.0)
MCHC: 31.4 g/dL (ref 30.0–36.0)
MCV: 91.8 fL (ref 78.0–100.0)
Platelets: 446 10*3/uL — ABNORMAL HIGH (ref 150–400)
RBC: 4.61 MIL/uL (ref 4.22–5.81)
RDW: 15.9 % — ABNORMAL HIGH (ref 11.5–15.5)
WBC: 21.8 10*3/uL — ABNORMAL HIGH (ref 4.0–10.5)

## 2017-02-05 LAB — EXPECTORATED SPUTUM ASSESSMENT W GRAM STAIN, RFLX TO RESP C

## 2017-02-05 LAB — BASIC METABOLIC PANEL
ANION GAP: 7 (ref 5–15)
BUN: 25 mg/dL — ABNORMAL HIGH (ref 6–20)
CALCIUM: 9.2 mg/dL (ref 8.9–10.3)
CHLORIDE: 107 mmol/L (ref 101–111)
CO2: 26 mmol/L (ref 22–32)
Creatinine, Ser: 0.96 mg/dL (ref 0.61–1.24)
GFR calc non Af Amer: >60 mL/min (ref >60)
Glucose, Bld: 157 mg/dL — ABNORMAL HIGH (ref 65–99)
Potassium: 4.2 mmol/L (ref 3.5–5.1)
Sodium: 140 mmol/L (ref 135–145)

## 2017-02-05 LAB — EXPECTORATED SPUTUM ASSESSMENT W REFEX TO RESP CULTURE

## 2017-02-05 LAB — TSH: TSH: 0.014 u[IU]/mL — AB (ref 0.350–4.500)

## 2017-02-05 MED ORDER — SODIUM CHLORIDE 0.9% FLUSH: 3.0000 mL | INTRAVENOUS | Status: DC | PRN

## 2017-02-05 MED ORDER — SODIUM CHLORIDE 0.9 % IV SOLN: 250.0000 mL | INTRAVENOUS | Status: DC

## 2017-02-05 MED ORDER — SODIUM CHLORIDE 0.9% FLUSH: 3.0000 mL | Freq: Two times a day (BID) | INTRAVENOUS | Status: DC

## 2017-02-05 MED ORDER — SODIUM CHLORIDE 0.9 % IV SOLN: INTRAVENOUS | Status: DC

## 2017-02-05 NOTE — Progress Notes (Addendum)
Benefit Check sent for Eliquis.   # 2. S/[facility] @ [employer] # [phone] OPT- 2    ELIQUIS 5 MG BID  COVER- YES  CO-PAY- $ 45.00  TIER- 3 DRUG  PRIOR APPROVAL- NO    APIXABAN- NONE FORMULARY    PREFERRED PHARMACY : [facility]    ----- Message -----  From: Carles Collet, RN  Sent: 02/05/2017  9:44 AM  To: Nestor Lewandowsky, RN, *  Subject: benefit Check                    Good Morning,  Please check:    apixaban (ELIQUIS) tablet 5 mg   Dose: 5 mg  Freq: 2 times daily Route: PO  Start: 02/03/17 [place],  [employer] [phone]

## 2017-02-05 NOTE — Progress Notes (Signed)
PROGRESS NOTE    Derrick Moon  DZH:[id number] DOB: [birth year] DOA: 02/02/2017 PCP: [name], MD   Chief Complaint  Patient presents with  . Shortness of Breath  . Atrial Fibrillation    Brief Narrative:  HPI on 02/02/2017 by Dr. Linna Darner Derrick Moon is a 76 y.o. male with medical history significant of Bell's palsy, bladder cancer, COPD, diverticulitis, BPH, OSA, restless leg syndrome, PAD, hypertension, hiatal hernia, colonic polyps, bronchitis/COPD.  Patient reports approximately 5-6 day history of increasing work of breathing with intermittent productive cough and subjective fevers. Denies any chest pain, palpitations, nausea, vomiting, abdominal pain, dysuria, frequency, neck stiffness, focal neurological deficit. Patient endorses using intermittent home breathing treatments with minimal improvement. Patient states that he was at the point of no longer being able to handle the symptoms which is what brought him into his pulmonologist, Dr. Melvyn Novas. The pulmonologists office patient was noted to be significantly tachycardic, with O2 saturations in the mid 80s, and in atrial fibrillation and extremities and was sent emergency room via EMS.  Assessment & Plan   Severe sepsis secondary to respiratory infection -Presented with leukocytosis, tachycardia, tachypnea with hypoxemia and hypotension, and AKI -Treatment plan as below -Continue Levaquin -Blood cultures show no growth to date -sputum culture pending; gram stain shows Few GPC in clusters, rare GPR  Acute on chronic respiratory failure with hypoxemia -Likely from purulent tracheobronchitis (per Dr. Gustavus Bryant outpatient note) -Seen by pulmonologist on day of admission -Continue Solu-Medrol, nebulizer treatments, Mucinex, Levaquin, spiriva -Sputum culture as above -respiratory viral panel unremarkable  -patient feels his breathing has improved   Atrial fibrillation with RVR, new -Heart rate was running between 150s to  170s, currently in 120s -Currently on Cardizem drip, rate increased by cardiology to 20 -Echocardiogram EF60-75%, wall motion was normal, no regional wall motion abnormalities -Cardiology consulted and appreciated -Was started on heparin however transitioned to Eliquis -has been receiving pushes of IV metoprolol -plan for TEE/DCCV on 02/06/2017  Acute kidney injury -Creatinine at baseline 0.99, upon admission 1.47 -Currently creatinine 0.96 -Continue monitor BMP  Hypertension -Continue Avapro  BPH -Continue Flomax  Tobacco abuse -Patient smokes 3-4 cigars per day however last use was 10 days prior to admission -Discussed smoking cessation  DVT Prophylaxis  Eliquis  Code Status: Full  Family Communication: None at bedside  Disposition Plan: Admitted, continue to monitor in stepdown  Consultants Cardiology  Procedures  Echocardiogram  Antibiotics   Anti-infectives (From admission, onward)   Start     Dose/Rate Route Frequency Ordered Stop   02/03/17 1500  levofloxacin (LEVAQUIN) IVPB 500 mg     500 mg 100 mL/hr over 60 Minutes Intravenous Every 24 hours 02/02/17 1806     02/02/17 1345  levofloxacin (LEVAQUIN) IVPB 750 mg     750 mg 100 mL/hr over 90 Minutes Intravenous  Once 02/02/17 1333 02/02/17 1519      Subjective:   Derrick Moon seen and examined today. Feels breathing is improving. Continues to have productive cough. Feeling better overall. Denies chest pain, abdominal pain. Wants to get up and walk around.  Objective:   Vitals:   02/05/17 0720 02/05/17 0746 02/05/17 1029 02/05/17 1100  BP: 110/74 (!) 139/94  128/86  Pulse: 100 (!) 139 (!) 107 (!) 102  Resp: (!) 26 (!) 26 (!) 22 (!) 23  Temp: (!) 97.4 F (36.3 C) 97.9 F (36.6 C)  98.8 F (37.1 C)  TempSrc: Axillary Oral  Axillary  SpO2: 94% 94% 95% 96%  Weight:      Height:        Intake/Output Summary (Last 24 hours) at 02/05/2017 1340 Last data filed at 02/05/2017 1000 Gross per 24 hour    Intake 3129 ml  Output 625 ml  Net 2504 ml   Filed Weights   02/02/17 1230 02/03/17 0311 02/05/17 0700  Weight: 85.7 kg (189 lb) 87.6 kg (193 lb 2 oz) 91.9 kg (202 lb 9.6 oz)   Exam  General: Well developed, well nourished, NAD, appears stated age  47: NCAT,  mucous membranes moist.   Cardiovascular: S1 S2 auscultated, irregularly irregular  Respiratory: Clear to auscultation bilaterally with equal chest rise  Abdomen: Soft, nontender, nondistended, + bowel sounds  Extremities: warm dry without cyanosis clubbing or edema  Neuro: AAOx3, nonfocal  Psych: Appropriate mood and affect, pleasant   Data Reviewed: I have personally reviewed following labs and imaging studies  CBC: Recent Labs  Lab 02/02/17 1245 02/03/17 0837 02/04/17 0344 02/05/17 0757  WBC 24.7* 20.6* 25.4* 21.8*  HGB 14.9 14.6 13.4 13.3  HCT 45.9 44.6 42.4 42.3  MCV 91.8 91.4 92.2 91.8  PLT 309 369 419* 092*   Basic Metabolic Panel: Recent Labs  Lab 02/02/17 1245 02/03/17 0837 02/04/17 0344 02/05/17 0757  NA 139 139 141 140  K 3.7 4.0 4.0 4.2  CL 103 107 108 107  CO2 25 23 25 26   GLUCOSE 146* 182* 158* 157*  BUN 20 23* 34* 25*  CREATININE 1.47* 1.15 1.12 0.96  CALCIUM 9.3 9.4 9.7 9.2  MG  --   --   --  2.3   GFR: Estimated Creatinine Clearance: 74.6 mL/min (by C-G formula based on SCr of 0.96 mg/dL). Liver Function Tests: Recent Labs  Lab 02/03/17 0837  AST 56*  ALT 51  ALKPHOS 106  BILITOT 0.7  PROT 6.2*  ALBUMIN 2.6*   No results for input(s): LIPASE, AMYLASE in the last 168 hours. No results for input(s): AMMONIA in the last 168 hours. Coagulation Profile: No results for input(s): INR, PROTIME in the last 168 hours. Cardiac Enzymes: No results for input(s): CKTOTAL, CKMB, CKMBINDEX, TROPONINI in the last 168 hours. BNP (last 3 results) No results for input(s): PROBNP in the last 8760 hours. HbA1C: No results for input(s): HGBA1C in the last 72 hours. CBG: No results  for input(s): GLUCAP in the last 168 hours. Lipid Profile: No results for input(s): CHOL, HDL, LDLCALC, TRIG, CHOLHDL, LDLDIRECT in the last 72 hours. Thyroid Function Tests: Recent Labs    02/05/17 0759  TSH 0.014*   Anemia Panel: No results for input(s): VITAMINB12, FOLATE, FERRITIN, TIBC, IRON, RETICCTPCT in the last 72 hours. Urine analysis:    Component Value Date/Time   COLORURINE YELLOW 09/14/2015 [place] 09/14/2015 0934   LABSPEC 1.028 09/14/2015 0934   PHURINE 5.0 09/14/2015 0934   GLUCOSEU NEGATIVE 09/14/2015 0934   HGBUR NEGATIVE 09/14/2015 0934   BILIRUBINUR NEGATIVE 09/14/2015 0934   KETONESUR NEGATIVE 09/14/2015 0934   PROTEINUR NEGATIVE 09/14/2015 0934   UROBILINOGEN 0.2 10/06/2008 0800   NITRITE NEGATIVE 09/14/2015 0934   LEUKOCYTESUR NEGATIVE 09/14/2015 0934   Sepsis Labs: @LABRCNTIP (procalcitonin:4,lacticidven:4)  ) Recent Results (from the past 240 hour(s))  Respiratory Panel by PCR     Status: None   Collection Time: 02/02/17  5:26 PM  Result Value Ref Range Status   Adenovirus NOT DETECTED NOT DETECTED Final   Coronavirus 229E NOT DETECTED NOT DETECTED Final   Coronavirus HKU1 NOT DETECTED NOT  DETECTED Final   Coronavirus NL63 NOT DETECTED NOT DETECTED Final   Coronavirus OC43 NOT DETECTED NOT DETECTED Final   Metapneumovirus NOT DETECTED NOT DETECTED Final   Rhinovirus / Enterovirus NOT DETECTED NOT DETECTED Final   Influenza A NOT DETECTED NOT DETECTED Final   Influenza B NOT DETECTED NOT DETECTED Final   Parainfluenza Virus 1 NOT DETECTED NOT DETECTED Final   Parainfluenza Virus 2 NOT DETECTED NOT DETECTED Final   Parainfluenza Virus 3 NOT DETECTED NOT DETECTED Final   Parainfluenza Virus 4 NOT DETECTED NOT DETECTED Final   Respiratory Syncytial Virus NOT DETECTED NOT DETECTED Final   Bordetella pertussis NOT DETECTED NOT DETECTED Final   Chlamydophila pneumoniae NOT DETECTED NOT DETECTED Final   Mycoplasma pneumoniae NOT  DETECTED NOT DETECTED Final  MRSA PCR Screening     Status: None   Collection Time: 02/02/17  9:52 PM  Result Value Ref Range Status   MRSA by PCR NEGATIVE NEGATIVE Final    Comment:        The GeneXpert MRSA Assay (FDA approved for NASAL specimens only), is one component of a comprehensive MRSA colonization surveillance program. It is not intended to diagnose MRSA infection nor to guide or monitor treatment for MRSA infections.   Culture, blood (routine x 2)     Status: None (Preliminary result)   Collection Time: 02/04/17  3:15 PM  Result Value Ref Range Status   Specimen Description BLOOD LEFT ANTECUBITAL  Final   Special Requests   Final    BOTTLES DRAWN AEROBIC AND ANAEROBIC Blood Culture adequate volume   Culture NO GROWTH < 24 HOURS  Final   Report Status PENDING  Incomplete  Culture, blood (routine x 2)     Status: None (Preliminary result)   Collection Time: 02/04/17  3:30 PM  Result Value Ref Range Status   Specimen Description BLOOD LEFT HAND  Final   Special Requests   Final    BOTTLES DRAWN AEROBIC AND ANAEROBIC Blood Culture adequate volume   Culture NO GROWTH < 24 HOURS  Final   Report Status PENDING  Incomplete  Culture, expectorated sputum-assessment     Status: None   Collection Time: 02/05/17 10:46 AM  Result Value Ref Range Status   Specimen Description EXPECTORATED SPUTUM  Final   Special Requests NONE  Final   Sputum evaluation THIS SPECIMEN IS ACCEPTABLE FOR SPUTUM CULTURE  Final   Report Status 02/05/2017 FINAL  Final  Culture, respiratory (NON-Expectorated)     Status: None (Preliminary result)   Collection Time: 02/05/17 10:46 AM  Result Value Ref Range Status   Specimen Description EXPECTORATED SPUTUM  Final   Special Requests NONE Reflexed from [id number]  Final   Gram Stain   Final    ABUNDANT WBC PRESENT, PREDOMINANTLY PMN RARE SQUAMOUS EPITHELIAL CELLS PRESENT FEW GRAM POSITIVE COCCI IN PAIRS IN CLUSTERS RARE GRAM POSITIVE RODS    Culture  PENDING  Incomplete   Report Status PENDING  Incomplete      Radiology Studies: No results found.   Scheduled Meds: . apixaban  5 mg Oral BID  . aspirin EC  81 mg Oral BID  . guaiFENesin  600 mg Oral BID  . irbesartan  150 mg Oral Daily  . levalbuterol  0.63 mg Nebulization Q6H  . methylPREDNISolone (SOLU-MEDROL) injection  125 mg Intravenous Q8H  . sodium chloride flush  3 mL Intravenous Q12H  . tamsulosin  0.4 mg Oral BID   Continuous Infusions: . sodium chloride 75  mL/hr at 02/05/17 0649  . sodium chloride    . diltiazem (CARDIZEM) infusion 20 mg/hr (02/05/17 1231)  . levofloxacin (LEVAQUIN) IV Stopped (02/04/17 1635)     LOS: 3 days   Time Spent in minutes   30 minutes  Neftali Abair D.O. on 02/05/2017 at 1:40 PM  Between 7am to 7pm - Pager - [phone]  After 7pm go to [email or web address] - password [email or web address]  And look for the night coverage person covering for me after hours  [facility] Office  [phone]

## 2017-02-05 NOTE — Care Management Note (Signed)
Case Management Note  Patient Details  Name: Derrick Moon MRN: [id number] Date of Birth: September 02, 1940  Subjective/Objective:                 Spoke w patient at the bedside. He states he is from home w his wife, independent pta has [place] and is out in it everyday. He has no DME. He is currently on O2, will need to watch for home O2 needs. Provided with 30 day Eliquis card, benefit check pending.    Action/Plan:  [place] check pending, watch for home O2 needs.   Expected Discharge Date:                  Expected Discharge Plan:  Home/Self Care  In-House Referral:     Discharge planning Services  CM Consult  Post Acute Care Choice:    Choice offered to:     DME Arranged:    DME Agency:     HH Arranged:    HH Agency:     Status of Service:  In process, will continue to follow  If discussed at Long Length of Stay Meetings, dates discussed:    Additional Comments:  Carles Collet, RN 02/05/2017, 9:59 AM

## 2017-02-05 NOTE — Plan of Care (Signed)
Patient continues to receive Cardizem drip. While sleeping HR in the 100's, when pt woke up this morning he returned to the 140-150's bpm. BP stable. No complaints of chest pain. Per notes, cardioversion is planned for Friday. Maintained on 4l, bled into CPAP this evening.

## 2017-02-05 NOTE — H&P (View-Only) (Signed)
Progress Note  Patient Name: Derrick Moon Date of Encounter: 02/05/2017  Primary Cardiologist:   Dr. Bronson Ing  Subjective   Breathing better.  No chest pain.    Inpatient Medications    Scheduled Meds: . apixaban  5 mg Oral BID  . aspirin EC  81 mg Oral BID  . guaiFENesin  600 mg Oral BID  . irbesartan  150 mg Oral Daily  . levalbuterol  0.63 mg Nebulization Q6H  . methylPREDNISolone (SOLU-MEDROL) injection  125 mg Intravenous Q8H  . sodium chloride flush  3 mL Intravenous Q12H  . tamsulosin  0.4 mg Oral BID   Continuous Infusions: . sodium chloride 75 mL/hr at 02/05/17 0649  . sodium chloride    . diltiazem (CARDIZEM) infusion 20 mg/hr (02/05/17 0648)  . levofloxacin (LEVAQUIN) IV Stopped (02/04/17 1635)   PRN Meds: acetaminophen **OR** acetaminophen, bisacodyl, ondansetron **OR** ondansetron (ZOFRAN) IV, QUEtiapine, senna-docusate, sodium chloride flush   Vital Signs    Vitals:   02/05/17 0720 02/05/17 0746 02/05/17 1029 02/05/17 1100  BP: 110/74 (!) 139/94  128/86  Pulse: 100 (!) 139 (!) 107 (!) 102  Resp: (!) 26 (!) 26 (!) 22 (!) 23  Temp: (!) 97.4 F (36.3 C) 97.9 F (36.6 C)  98.8 F (37.1 C)  TempSrc: Axillary Oral  Axillary  SpO2: 94% 94% 95% 96%  Weight:      Height:        Intake/Output Summary (Last 24 hours) at 02/05/2017 1146 Last data filed at 02/05/2017 0720 Gross per 24 hour  Intake 3015 ml  Output 625 ml  Net 2390 ml   Filed Weights   02/02/17 1230 02/03/17 0311 02/05/17 0700  Weight: 189 lb (85.7 kg) 193 lb 2 oz (87.6 kg) 202 lb 9.6 oz (91.9 kg)    Telemetry    Atrial fib with rapid rate - Personally Reviewed  ECG    NA - Personally Reviewed  Physical Exam   GEN: No  acute distress.   Neck: No  JVD Cardiac: Irregular RR, no murmurs, rubs, or gallops.  Respiratory:    Decreased breath sounds with some wheezing.  GI: Soft, nontender, non-distended, normal bowel sounds  MS:  No edema; No deformity. Neuro:   Nonfocal    Psych: Oriented and appropriate   Labs    Chemistry Recent Labs  Lab 02/03/17 0837 02/04/17 0344 02/05/17 0757  NA 139 141 140  K 4.0 4.0 4.2  CL 107 108 107  CO2 23 25 26   GLUCOSE 182* 158* 157*  BUN 23* 34* 25*  CREATININE 1.15 1.12 0.96  CALCIUM 9.4 9.7 9.2  PROT 6.2*  --   --   ALBUMIN 2.6*  --   --   AST 56*  --   --   ALT 51  --   --   ALKPHOS 106  --   --   BILITOT 0.7  --   --   GFRNONAA 60* >60 >60  GFRAA >60 >60 >60  ANIONGAP 9 8 7      Hematology Recent Labs  Lab 02/03/17 0837 02/04/17 0344 02/05/17 0757  WBC 20.6* 25.4* 21.8*  RBC 4.88 4.60 4.61  HGB 14.6 13.4 13.3  HCT 44.6 42.4 42.3  MCV 91.4 92.2 91.8  MCH 29.9 29.1 28.9  MCHC 32.7 31.6 31.4  RDW 16.1* 16.0* 15.9*  PLT 369 419* 446*    Cardiac EnzymesNo results for input(s): TROPONINI in the last 168 hours.  Recent Labs  Lab 02/02/17 1246  TROPIPOC 0.06     BNPNo results for input(s): BNP, PROBNP in the last 168 hours.   DDimer No results for input(s): DDIMER in the last 168 hours.   Radiology    No results found.  Cardiac Studies   ECHO 02/04/18  Patient Profile     76 y.o. male with a hx of HTN, COPD, AAA, PAD,who is being seen for atrial fibrillation with RVR.   Assessment & Plan    Acute on chronic respiratory failure:   Improving.  Plan per primary team.   Atrial fib:  Plan TEE/DCCV tomorrow.  He is on Eliquis  AKI:  Creat is back to baseline    Hyperthyroid:  TSH is reduced.  Needs to have this addressed by primary team.  We can proceed with TEE/DCCV but less likely to maintain NSR until this is addressed.    HTN:   BP well controlled.   OSA:  Used CPAP part of last night.   For questions or updates, please contact [place] Please consult [email or web address] for contact info under Cardiology/STEMI.   Signed, Minus Breeding, MD  02/05/2017, 11:46 AM

## 2017-02-05 NOTE — Progress Notes (Signed)
Pharmacy Antibiotic Note  Derrick Moon is a 76 y.o. male admitted on 02/02/2017 with purulent tracheobronchitis .  Pharmacy has been consulted for Levaquin dosing.  WBC stable around 21.8 - likely being impacted by methylprednisolone dosing (on 125 mg every 8 hours since 11/12). Afebrile. Renal function continues to improve (Scr 0.96, CrCl ~75 mL/min). Currently on day 4 of levaquin therapy.   Plan: Continue Levaquin 500mg  IV Q24H  Consider tapering methylprednisolone Consider stop date of levofloxacin therapy Monitor renal function, clinical status, cx results   Height: 5\' 11"  (180.3 cm) Weight: 202 lb 9.6 oz (91.9 kg) IBW/kg (Calculated) : 75.3  Temp (24hrs), Avg:97.8 F (36.6 C), Min:97.4 F (36.3 C), Max:98.3 F (36.8 C)  Recent Labs  Lab 02/02/17 1245 02/02/17 1359 02/02/17 1625 02/03/17 0837 02/04/17 0344 02/05/17 0757  WBC 24.7*  --   --  20.6* 25.4* 21.8*  CREATININE 1.47*  --   --  1.15 1.12 0.96  LATICACIDVEN  --  2.56* 1.65  --   --   --     Estimated Creatinine Clearance: 74.6 mL/min (by C-G formula based on SCr of 0.96 mg/dL).    Allergies  Allergen Reactions  . Aspartame And Phenylalanine Nausea Only  . Other Rash    Pt reports rash on back, buttocks, shoulders and stomach after arteriogram  . Sulfonamide Derivatives Nausea And Vomiting    Antimicrobials this admission: Levaquin 11/12 >>  Dose adjustments this admission: N/A  Microbiology results:  11/14 Bcx: sent 11/12 Resp PCR: neg 11/12 MRSA PCR: negative  Thank you for allowing pharmacy to be a part of this patient's care.  Doylene Canard, PharmD Clinical Pharmacist  Pager: [phone] Clinical Phone for 02/05/2017 until 3:30pm: [phone] If after 3:30pm, please call main pharmacy at [phone]

## 2017-02-05 NOTE — Progress Notes (Signed)
Progress Note  Patient Name: Derrick Moon Date of Encounter: 02/05/2017  Primary Cardiologist:   Dr. Bronson Ing  Subjective   Breathing better.  No chest pain.    Inpatient Medications    Scheduled Meds: . apixaban  5 mg Oral BID  . aspirin EC  81 mg Oral BID  . guaiFENesin  600 mg Oral BID  . irbesartan  150 mg Oral Daily  . levalbuterol  0.63 mg Nebulization Q6H  . methylPREDNISolone (SOLU-MEDROL) injection  125 mg Intravenous Q8H  . sodium chloride flush  3 mL Intravenous Q12H  . tamsulosin  0.4 mg Oral BID   Continuous Infusions: . sodium chloride 75 mL/hr at 02/05/17 0649  . sodium chloride    . diltiazem (CARDIZEM) infusion 20 mg/hr (02/05/17 0648)  . levofloxacin (LEVAQUIN) IV Stopped (02/04/17 1635)   PRN Meds: acetaminophen **OR** acetaminophen, bisacodyl, ondansetron **OR** ondansetron (ZOFRAN) IV, QUEtiapine, senna-docusate, sodium chloride flush   Vital Signs    Vitals:   02/05/17 0720 02/05/17 0746 02/05/17 1029 02/05/17 1100  BP: 110/74 (!) 139/94  128/86  Pulse: 100 (!) 139 (!) 107 (!) 102  Resp: (!) 26 (!) 26 (!) 22 (!) 23  Temp: (!) 97.4 F (36.3 C) 97.9 F (36.6 C)  98.8 F (37.1 C)  TempSrc: Axillary Oral  Axillary  SpO2: 94% 94% 95% 96%  Weight:      Height:        Intake/Output Summary (Last 24 hours) at 02/05/2017 1146 Last data filed at 02/05/2017 0720 Gross per 24 hour  Intake 3015 ml  Output 625 ml  Net 2390 ml   Filed Weights   02/02/17 1230 02/03/17 0311 02/05/17 0700  Weight: 189 lb (85.7 kg) 193 lb 2 oz (87.6 kg) 202 lb 9.6 oz (91.9 kg)    Telemetry    Atrial fib with rapid rate - Personally Reviewed  ECG    NA - Personally Reviewed  Physical Exam   GEN: No  acute distress.   Neck: No  JVD Cardiac: Irregular RR, no murmurs, rubs, or gallops.  Respiratory:    Decreased breath sounds with some wheezing.  GI: Soft, nontender, non-distended, normal bowel sounds  MS:  No edema; No deformity. Neuro:   Nonfocal    Psych: Oriented and appropriate   Labs    Chemistry Recent Labs  Lab 02/03/17 0837 02/04/17 0344 02/05/17 0757  NA 139 141 140  K 4.0 4.0 4.2  CL 107 108 107  CO2 23 25 26   GLUCOSE 182* 158* 157*  BUN 23* 34* 25*  CREATININE 1.15 1.12 0.96  CALCIUM 9.4 9.7 9.2  PROT 6.2*  --   --   ALBUMIN 2.6*  --   --   AST 56*  --   --   ALT 51  --   --   ALKPHOS 106  --   --   BILITOT 0.7  --   --   GFRNONAA 60* >60 >60  GFRAA >60 >60 >60  ANIONGAP 9 8 7      Hematology Recent Labs  Lab 02/03/17 0837 02/04/17 0344 02/05/17 0757  WBC 20.6* 25.4* 21.8*  RBC 4.88 4.60 4.61  HGB 14.6 13.4 13.3  HCT 44.6 42.4 42.3  MCV 91.4 92.2 91.8  MCH 29.9 29.1 28.9  MCHC 32.7 31.6 31.4  RDW 16.1* 16.0* 15.9*  PLT 369 419* 446*    Cardiac EnzymesNo results for input(s): TROPONINI in the last 168 hours.  Recent Labs  Lab 02/02/17 1246  TROPIPOC 0.06     BNPNo results for input(s): BNP, PROBNP in the last 168 hours.   DDimer No results for input(s): DDIMER in the last 168 hours.   Radiology    No results found.  Cardiac Studies   ECHO 02/04/18  Patient Profile     76 y.o. male with a hx of HTN, COPD, AAA, PAD,who is being seen for atrial fibrillation with RVR.   Assessment & Plan    Acute on chronic respiratory failure:   Improving.  Plan per primary team.   Atrial fib:  Plan TEE/DCCV tomorrow.  He is on Eliquis  AKI:  Creat is back to baseline    Hyperthyroid:  TSH is reduced.  Needs to have this addressed by primary team.  We can proceed with TEE/DCCV but less likely to maintain NSR until this is addressed.    HTN:   BP well controlled.   OSA:  Used CPAP part of last night.   For questions or updates, please contact [place] Please consult [email or web address] for contact info under Cardiology/STEMI.   Signed, Minus Breeding, MD  02/05/2017, 11:46 AM

## 2017-02-05 NOTE — Discharge Instructions (Signed)

## 2017-02-06 ENCOUNTER — Inpatient Hospital Stay (HOSPITAL_COMMUNITY): Payer: PPO | Admitting: Certified Registered Nurse Anesthetist

## 2017-02-06 ENCOUNTER — Inpatient Hospital Stay (HOSPITAL_COMMUNITY): Payer: PPO

## 2017-02-06 ENCOUNTER — Encounter (HOSPITAL_COMMUNITY): Payer: Self-pay | Admitting: *Deleted

## 2017-02-06 ENCOUNTER — Encounter (HOSPITAL_COMMUNITY)
Admission: EM | Disposition: A | Discharge status: Home / Self Care | Payer: Self-pay | Source: Home / Self Care | Attending: Internal Medicine

## 2017-02-06 DIAGNOSIS — N179 Acute kidney failure, unspecified: Secondary | ICD-10-CM

## 2017-02-06 DIAGNOSIS — I4891 Unspecified atrial fibrillation: Secondary | ICD-10-CM

## 2017-02-06 DIAGNOSIS — I340 Nonrheumatic mitral (valve) insufficiency: Secondary | ICD-10-CM

## 2017-02-06 HISTORY — PX: CARDIOVERSION: SHX1299

## 2017-02-06 HISTORY — PX: TEE WITHOUT CARDIOVERSION: SHX5443

## 2017-02-06 LAB — CBC WITH DIFFERENTIAL/PLATELET
BASOS PCT: 0 %
Basophils Absolute: 0 10*3/uL (ref 0.0–0.1)
EOS PCT: 0 %
Eosinophils Absolute: 0 10*3/uL (ref 0.0–0.7)
HEMATOCRIT: 42.1 % (ref 39.0–52.0)
Hemoglobin: 13.4 g/dL (ref 13.0–17.0)
LYMPHS ABS: 1.4 10*3/uL (ref 0.7–4.0)
Lymphocytes Relative: 7 %
MCH: 28.9 pg (ref 26.0–34.0)
MCHC: 31.8 g/dL (ref 30.0–36.0)
MCV: 90.9 fL (ref 78.0–100.0)
Monocytes Absolute: 0.8 10*3/uL (ref 0.1–1.0)
Monocytes Relative: 4 %
NEUTROS ABS: 18.3 10*3/uL — AB (ref 1.7–7.7)
Neutrophils Relative %: 89 %
Platelets: 465 10*3/uL — ABNORMAL HIGH (ref 150–400)
RBC: 4.63 MIL/uL (ref 4.22–5.81)
RDW: 15.6 % — AB (ref 11.5–15.5)
WBC: 20.5 10*3/uL — ABNORMAL HIGH (ref 4.0–10.5)

## 2017-02-06 LAB — T4, FREE: FREE T4: 1.32 ng/dL — AB (ref 0.61–1.12)

## 2017-02-06 LAB — BASIC METABOLIC PANEL
ANION GAP: 8 (ref 5–15)
BUN: 25 mg/dL — AB (ref 6–20)
CO2: 27 mmol/L (ref 22–32)
Calcium: 9.3 mg/dL (ref 8.9–10.3)
Chloride: 106 mmol/L (ref 101–111)
Creatinine, Ser: 1.09 mg/dL (ref 0.61–1.24)
Glucose, Bld: 166 mg/dL — ABNORMAL HIGH (ref 65–99)
POTASSIUM: 4.4 mmol/L (ref 3.5–5.1)
SODIUM: 141 mmol/L (ref 135–145)

## 2017-02-06 LAB — PATHOLOGIST SMEAR REVIEW

## 2017-02-06 LAB — PROTIME-INR
INR: 1.29
PROTHROMBIN TIME: 16 s — AB (ref 11.4–15.2)

## 2017-02-06 SURGERY — ECHOCARDIOGRAM, TRANSESOPHAGEAL
Anesthesia: General

## 2017-02-06 MED ORDER — PROMETHAZINE HCL 25 MG/ML IJ SOLN: 6.2500 mg | INTRAMUSCULAR | Status: DC | PRN

## 2017-02-06 MED ORDER — MIDAZOLAM HCL 2 MG/2ML IJ SOLN: 0.5000 mg | Freq: Once | INTRAMUSCULAR | Status: DC | PRN

## 2017-02-06 MED ORDER — BUTAMBEN-TETRACAINE-BENZOCAINE 2-2-14 % EX AERO
INHALATION_SPRAY | CUTANEOUS | Status: DC | PRN
  Administered 2017-02-06: 2 via TOPICAL

## 2017-02-06 MED ORDER — METOPROLOL TARTRATE 25 MG PO TABS
25.0000 mg | ORAL_TABLET | Freq: Two times a day (BID) | ORAL | Status: DC
  Administered 2017-02-06 – 2017-02-08 (×4): 25 mg via ORAL
  Filled 2017-02-06 (×4): qty 1

## 2017-02-06 MED ORDER — MEPERIDINE HCL 25 MG/ML IJ SOLN: 6.2500 mg | INTRAMUSCULAR | Status: DC | PRN

## 2017-02-06 MED ORDER — LEVOFLOXACIN 500 MG PO TABS
500.0000 mg | ORAL_TABLET | ORAL | Status: DC
  Administered 2017-02-06 – 2017-02-08 (×3): 500 mg via ORAL
  Filled 2017-02-06 (×3): qty 1

## 2017-02-06 MED ORDER — PROPOFOL 10 MG/ML IV BOLUS
INTRAVENOUS | Status: DC | PRN
  Administered 2017-02-06: 30 mg via INTRAVENOUS
  Administered 2017-02-06: 20 mg via INTRAVENOUS

## 2017-02-06 MED ORDER — PROPOFOL 500 MG/50ML IV EMUL
INTRAVENOUS | Status: DC | PRN
  Administered 2017-02-06: 100 ug/kg/min via INTRAVENOUS

## 2017-02-06 MED ORDER — METHYLPREDNISOLONE SODIUM SUCC 125 MG IJ SOLR
80.0000 mg | Freq: Two times a day (BID) | INTRAMUSCULAR | Status: DC
  Administered 2017-02-06 – 2017-02-07 (×2): 80 mg via INTRAVENOUS
  Filled 2017-02-06 (×2): qty 2

## 2017-02-06 NOTE — Progress Notes (Signed)
Progress Note  Patient Name: RAJOHN HENERY Date of Encounter: 02/06/2017  Primary Cardiologist:   Dr. Bronson Ing  Subjective   Breathing better ("100% better").  No chest pain.     Inpatient Medications    Scheduled Meds: . apixaban  5 mg Oral BID  . aspirin EC  81 mg Oral BID  . guaiFENesin  600 mg Oral BID  . irbesartan  150 mg Oral Daily  . levalbuterol  0.63 mg Nebulization Q6H  . methylPREDNISolone (SOLU-MEDROL) injection  80 mg Intravenous Q12H  . tamsulosin  0.4 mg Oral BID   Continuous Infusions: . diltiazem (CARDIZEM) infusion Stopped (02/06/17 1014)  . levofloxacin (LEVAQUIN) IV Stopped (02/05/17 1700)   PRN Meds: acetaminophen **OR** acetaminophen, bisacodyl, ondansetron **OR** ondansetron (ZOFRAN) IV, QUEtiapine, senna-docusate   Vital Signs    Vitals:   02/06/17 0232 02/06/17 0710 02/06/17 0905 02/06/17 0940  BP:  110/76 117/64   Pulse:  (!) 126 65   Resp:  20 19   Temp:  97.7 F (36.5 C) 98.1 F (36.7 C)   TempSrc:  Oral Oral   SpO2: 95% 96% 96% 96%  Weight:      Height:        Intake/Output Summary (Last 24 hours) at 02/06/2017 1027 Last data filed at 02/06/2017 0855 Gross per 24 hour  Intake 2285 ml  Output 1075 ml  Net 1210 ml   Filed Weights   02/02/17 1230 02/03/17 0311 02/05/17 0700  Weight: 189 lb (85.7 kg) 193 lb 2 oz (87.6 kg) 202 lb 9.6 oz (91.9 kg)    Telemetry    Atrial fib with rapid rate - Personally Reviewed  ECG    NA - Personally Reviewed  Physical Exam   GEN: No  acute distress.   Neck: No  JVD Cardiac: RRR, no murmurs, rubs, or gallops.  Respiratory:   Decreased breath sounds with few wheezes. Improved.  GI: Soft, nontender, non-distended, normal bowel sounds  MS:  No edema; No deformity. Neuro:   Nonfocal  Psych: Oriented and appropriate   Labs    Chemistry Recent Labs  Lab 02/03/17 0837 02/04/17 0344 02/05/17 0757 02/06/17 0520  NA 139 141 140 141  K 4.0 4.0 4.2 4.4  CL 107 108 107 106  CO2  23 25 26 27   GLUCOSE 182* 158* 157* 166*  BUN 23* 34* 25* 25*  CREATININE 1.15 1.12 0.96 1.09  CALCIUM 9.4 9.7 9.2 9.3  PROT 6.2*  --   --   --   ALBUMIN 2.6*  --   --   --   AST 56*  --   --   --   ALT 51  --   --   --   ALKPHOS 106  --   --   --   BILITOT 0.7  --   --   --   GFRNONAA 60* >60 >60 >60  GFRAA >60 >60 >60 >60  ANIONGAP 9 8 7 8      Hematology Recent Labs  Lab 02/04/17 0344 02/05/17 0757 02/06/17 0520  WBC 25.4* 21.8* 20.5*  RBC 4.60 4.61 4.63  HGB 13.4 13.3 13.4  HCT 42.4 42.3 42.1  MCV 92.2 91.8 90.9  MCH 29.1 28.9 28.9  MCHC 31.6 31.4 31.8  RDW 16.0* 15.9* 15.6*  PLT 419* 446* 465*    Cardiac EnzymesNo results for input(s): TROPONINI in the last 168 hours.  Recent Labs  Lab 02/02/17 1246  TROPIPOC 0.06     BNPNo  results for input(s): BNP, PROBNP in the last 168 hours.   DDimer No results for input(s): DDIMER in the last 168 hours.   Radiology    No results found.  Cardiac Studies   ECHO 02/04/18  Patient Profile     76 y.o. male with a hx of HTN, COPD, AAA, PAD,who is being seen for atrial fibrillation with RVR.   Assessment & Plan    Acute on chronic respiratory failure:   Plan per primary team.  Symptoms have improved.  Plan per primary team.  Atrial fib:  He is on Eliquis.  Now status post DCCV with TEE.   Stop ASA.    AKI:  Creat is back to baseline.  No change in therapy.    Hyperthyroid:  Discussed with primary care team.    HTN:   BP controlled.    OSA:  Using CPAP at night.    For questions or updates, please contact [place] Please consult [email or web address] for contact info under Cardiology/STEMI.   Signed, Minus Breeding, MD  02/06/2017, 10:27 AM

## 2017-02-06 NOTE — Progress Notes (Signed)
  Echocardiogram Echocardiogram Transesophageal has been performed.  Derrick Moon Derrick Moon 02/06/2017, 9:27 AM

## 2017-02-06 NOTE — Interval H&P Note (Signed)
History and Physical Interval Note:  02/06/2017 8:14 AM  Derrick Moon  has presented today for surgery, with the diagnosis of AFIB  The various methods of treatment have been discussed with the patient and family. After consideration of risks, benefits and other options for treatment, the patient has consented to  Procedure(s): TRANSESOPHAGEAL ECHOCARDIOGRAM (TEE) (N/A) CARDIOVERSION (N/A) as a surgical intervention .  The patient's history has been reviewed, patient examined, no change in status, stable for surgery.  I have reviewed the patient's chart and labs.  Questions were answered to the patient's satisfaction.     [employer]

## 2017-02-06 NOTE — Progress Notes (Addendum)
PROGRESS NOTE    Derrick Moon  OHY:[id number] DOB: November 27, 1940 DOA: 02/02/2017 PCP: [name], MD   Chief Complaint  Patient presents with  . Shortness of Breath  . Atrial Fibrillation    Brief Narrative:  HPI on 02/02/2017 by Dr. Linna Darner Derrick Moon is a 76 y.o. male with medical history significant of Bell's palsy, bladder cancer, COPD, diverticulitis, BPH, OSA, restless leg syndrome, PAD, hypertension, hiatal hernia, colonic polyps, bronchitis/COPD.  Patient reports approximately 5-6 day history of increasing work of breathing with intermittent productive cough and subjective fevers. Denies any chest pain, palpitations, nausea, vomiting, abdominal pain, dysuria, frequency, neck stiffness, focal neurological deficit. Patient endorses using intermittent home breathing treatments with minimal improvement. Patient states that he was at the point of no longer being able to handle the symptoms which is what brought him into his pulmonologist, Dr. Melvyn Novas. The pulmonologists office patient was noted to be significantly tachycardic, with O2 saturations in the mid 80s, and in atrial fibrillation and extremities and was sent emergency room via EMS.  Assessment & Plan   Severe sepsis secondary to respiratory infection -Presented with leukocytosis, tachycardia, tachypnea with hypoxemia and hypotension, and AKI -Treatment plan as below -Continue Levaquin -Blood cultures show no growth to date -sputum culture pending; gram stain shows Few GPC in clusters, rare GPR  Acute on chronic respiratory failure with hypoxemia -Likely from purulent tracheobronchitis (per Dr. Gustavus Bryant outpatient note) -Seen by pulmonologist on day of admission -Continue Solu-Medrol, nebulizer treatments, Mucinex, Levaquin, spiriva -Sputum culture as above -respiratory viral panel unremarkable  -patient feels his breathing has improved  -will decrease solumedrol dose today  Atrial fibrillation with RVR,  new -Heart rate was running between 150s to 170s -Echocardiogram EF60-75%, wall motion was normal, no regional wall motion abnormalities -Cardiology consulted and appreciated -Was started on heparin however transitioned to Eliquis -has been receiving pushes of IV metoprolol -s/p TEE/DCCV today -currently in sinus rhythm  -TSH checked, abnormal, will check T4  Acute kidney injury -Creatinine at baseline 0.99, upon admission 1.47 -Currently creatinine 1.09 -Continue monitor BMP  Hypertension -Continue Avapro  BPH -Continue Flomax  Tobacco abuse -Patient smokes 3-4 cigars per day however last use was 10 days prior to admission -Discussed smoking cessation  Abnormal TSH -Will check FT4/T3 levels  DVT Prophylaxis  Eliquis  Code Status: Full  Family Communication: None at bedside  Disposition Plan: Admitted, continue to monitor in stepdown  Consultants Cardiology  Procedures  Echocardiogram TEE/DCCV Antibiotics   Anti-infectives (From admission, onward)   Start     Dose/Rate Route Frequency Ordered Stop   02/03/17 1500  levofloxacin (LEVAQUIN) IVPB 500 mg     500 mg 100 mL/hr over 60 Minutes Intravenous Every 24 hours 02/02/17 1806     02/02/17 1345  levofloxacin (LEVAQUIN) IVPB 750 mg     750 mg 100 mL/hr over 90 Minutes Intravenous  Once 02/02/17 1333 02/02/17 1519      Subjective:   Derrick Moon seen and examined today. Pills breathing has improved. Feels very hungry this morning. Denies chest pain, abdominal pain, nausea vomiting, diarrhea or constipation, dizziness or headache.  Objective:   Vitals:   02/06/17 0232 02/06/17 0710 02/06/17 0905 02/06/17 0940  BP:  110/76 117/64   Pulse:  (!) 126 65   Resp:  20 19   Temp:  97.7 F (36.5 C) 98.1 F (36.7 C)   TempSrc:  Oral Oral   SpO2: 95% 96% 96% 96%  Weight:  Height:        Intake/Output Summary (Last 24 hours) at 02/06/2017 1002 Last data filed at 02/06/2017 0855 Gross per 24 hour   Intake 2285 ml  Output 1075 ml  Net 1210 ml   Filed Weights   02/02/17 1230 02/03/17 0311 02/05/17 0700  Weight: 85.7 kg (189 lb) 87.6 kg (193 lb 2 oz) 91.9 kg (202 lb 9.6 oz)   Exam  General: Well developed, well nourished, NAD, appears stated age  45: NCAT,mucous membranes moist.   Cardiovascular: S1 S2 auscultated,  Regular rate and rhythm.  Respiratory: Clear to auscultation bilaterally with equal chest rise  Abdomen: Soft, nontender, nondistended, + bowel sounds  Extremities: warm dry without cyanosis clubbing or edema  Neuro: AAOx3, nonfocal  Psych: Pleasant, appropriate mood and affect  Data Reviewed: I have personally reviewed following labs and imaging studies  CBC: Recent Labs  Lab 02/02/17 1245 02/03/17 0837 02/04/17 0344 02/05/17 0757 02/06/17 0520  WBC 24.7* 20.6* 25.4* 21.8* 20.5*  NEUTROABS  --   --   --   --  18.3*  HGB 14.9 14.6 13.4 13.3 13.4  HCT 45.9 44.6 42.4 42.3 42.1  MCV 91.8 91.4 92.2 91.8 90.9  PLT 309 369 419* 446* 811*   Basic Metabolic Panel: Recent Labs  Lab 02/02/17 1245 02/03/17 0837 02/04/17 0344 02/05/17 0757 02/06/17 0520  NA 139 139 141 140 141  K 3.7 4.0 4.0 4.2 4.4  CL 103 107 108 107 106  CO2 25 23 25 26 27   GLUCOSE 146* 182* 158* 157* 166*  BUN 20 23* 34* 25* 25*  CREATININE 1.47* 1.15 1.12 0.96 1.09  CALCIUM 9.3 9.4 9.7 9.2 9.3  MG  --   --   --  2.3  --    GFR: Estimated Creatinine Clearance: 65.7 mL/min (by C-G formula based on SCr of 1.09 mg/dL). Liver Function Tests: Recent Labs  Lab 02/03/17 0837  AST 56*  ALT 51  ALKPHOS 106  BILITOT 0.7  PROT 6.2*  ALBUMIN 2.6*   No results for input(s): LIPASE, AMYLASE in the last 168 hours. No results for input(s): AMMONIA in the last 168 hours. Coagulation Profile: Recent Labs  Lab 02/06/17 0520  INR 1.29   Cardiac Enzymes: No results for input(s): CKTOTAL, CKMB, CKMBINDEX, TROPONINI in the last 168 hours. BNP (last 3 results) No results for  input(s): PROBNP in the last 8760 hours. HbA1C: No results for input(s): HGBA1C in the last 72 hours. CBG: No results for input(s): GLUCAP in the last 168 hours. Lipid Profile: No results for input(s): CHOL, HDL, LDLCALC, TRIG, CHOLHDL, LDLDIRECT in the last 72 hours. Thyroid Function Tests: Recent Labs    02/05/17 0759  TSH 0.014*   Anemia Panel: No results for input(s): VITAMINB12, FOLATE, FERRITIN, TIBC, IRON, RETICCTPCT in the last 72 hours. Urine analysis:    Component Value Date/Time   COLORURINE YELLOW 09/14/2015 [place] 09/14/2015 0934   LABSPEC 1.028 09/14/2015 0934   PHURINE 5.0 09/14/2015 0934   GLUCOSEU NEGATIVE 09/14/2015 0934   HGBUR NEGATIVE 09/14/2015 0934   BILIRUBINUR NEGATIVE 09/14/2015 0934   KETONESUR NEGATIVE 09/14/2015 0934   PROTEINUR NEGATIVE 09/14/2015 0934   UROBILINOGEN 0.2 10/06/2008 0800   NITRITE NEGATIVE 09/14/2015 0934   LEUKOCYTESUR NEGATIVE 09/14/2015 0934   Sepsis Labs: @LABRCNTIP (procalcitonin:4,lacticidven:4)  ) Recent Results (from the past 240 hour(s))  Respiratory Panel by PCR     Status: None   Collection Time: 02/02/17  5:26 PM  Result  Value Ref Range Status   Adenovirus NOT DETECTED NOT DETECTED Final   Coronavirus 229E NOT DETECTED NOT DETECTED Final   Coronavirus HKU1 NOT DETECTED NOT DETECTED Final   Coronavirus NL63 NOT DETECTED NOT DETECTED Final   Coronavirus OC43 NOT DETECTED NOT DETECTED Final   Metapneumovirus NOT DETECTED NOT DETECTED Final   Rhinovirus / Enterovirus NOT DETECTED NOT DETECTED Final   Influenza A NOT DETECTED NOT DETECTED Final   Influenza B NOT DETECTED NOT DETECTED Final   Parainfluenza Virus 1 NOT DETECTED NOT DETECTED Final   Parainfluenza Virus 2 NOT DETECTED NOT DETECTED Final   Parainfluenza Virus 3 NOT DETECTED NOT DETECTED Final   Parainfluenza Virus 4 NOT DETECTED NOT DETECTED Final   Respiratory Syncytial Virus NOT DETECTED NOT DETECTED Final   Bordetella pertussis  NOT DETECTED NOT DETECTED Final   Chlamydophila pneumoniae NOT DETECTED NOT DETECTED Final   Mycoplasma pneumoniae NOT DETECTED NOT DETECTED Final  MRSA PCR Screening     Status: None   Collection Time: 02/02/17  9:52 PM  Result Value Ref Range Status   MRSA by PCR NEGATIVE NEGATIVE Final    Comment:        The GeneXpert MRSA Assay (FDA approved for NASAL specimens only), is one component of a comprehensive MRSA colonization surveillance program. It is not intended to diagnose MRSA infection nor to guide or monitor treatment for MRSA infections.   Culture, blood (routine x 2)     Status: None (Preliminary result)   Collection Time: 02/04/17  3:15 PM  Result Value Ref Range Status   Specimen Description BLOOD LEFT ANTECUBITAL  Final   Special Requests   Final    BOTTLES DRAWN AEROBIC AND ANAEROBIC Blood Culture adequate volume   Culture NO GROWTH < 24 HOURS  Final   Report Status PENDING  Incomplete  Culture, blood (routine x 2)     Status: None (Preliminary result)   Collection Time: 02/04/17  3:30 PM  Result Value Ref Range Status   Specimen Description BLOOD LEFT HAND  Final   Special Requests   Final    BOTTLES DRAWN AEROBIC AND ANAEROBIC Blood Culture adequate volume   Culture NO GROWTH < 24 HOURS  Final   Report Status PENDING  Incomplete  Culture, expectorated sputum-assessment     Status: None   Collection Time: 02/05/17 10:46 AM  Result Value Ref Range Status   Specimen Description EXPECTORATED SPUTUM  Final   Special Requests NONE  Final   Sputum evaluation THIS SPECIMEN IS ACCEPTABLE FOR SPUTUM CULTURE  Final   Report Status 02/05/2017 FINAL  Final  Culture, respiratory (NON-Expectorated)     Status: None (Preliminary result)   Collection Time: 02/05/17 10:46 AM  Result Value Ref Range Status   Specimen Description EXPECTORATED SPUTUM  Final   Special Requests NONE Reflexed from [id number]  Final   Gram Stain   Final    ABUNDANT WBC PRESENT, PREDOMINANTLY  PMN RARE SQUAMOUS EPITHELIAL CELLS PRESENT FEW GRAM POSITIVE COCCI IN PAIRS IN CLUSTERS RARE GRAM POSITIVE RODS    Culture CULTURE REINCUBATED FOR BETTER GROWTH  Final   Report Status PENDING  Incomplete      Radiology Studies: No results found.   Scheduled Meds: . apixaban  5 mg Oral BID  . aspirin EC  81 mg Oral BID  . guaiFENesin  600 mg Oral BID  . irbesartan  150 mg Oral Daily  . levalbuterol  0.63 mg Nebulization Q6H  . methylPREDNISolone (SOLU-MEDROL)  injection  80 mg Intravenous Q12H  . tamsulosin  0.4 mg Oral BID   Continuous Infusions: . diltiazem (CARDIZEM) infusion 20 mg/hr (02/06/17 0240)  . levofloxacin (LEVAQUIN) IV Stopped (02/05/17 1700)     LOS: 4 days   Time Spent in minutes   30 minutes  Havanna Groner D.O. on 02/06/2017 at 10:02 AM  Between 7am to 7pm - Pager - [phone]  After 7pm go to [email or web address] - password [email or web address]  And look for the night coverage person covering for me after hours  [facility] Office  [phone]

## 2017-02-06 NOTE — Anesthesia Postprocedure Evaluation (Signed)
Anesthesia Post Note  Patient: Derrick Moon  Procedure(s) Performed: TRANSESOPHAGEAL ECHOCARDIOGRAM (TEE) (N/A ) CARDIOVERSION (N/A )     Patient location during evaluation: Endoscopy Anesthesia Type: MAC Level of consciousness: awake and alert, patient cooperative and oriented Pain management: pain level controlled Vital Signs Assessment: post-procedure vital signs reviewed and stable Respiratory status: nonlabored ventilation, spontaneous breathing, respiratory function stable and patient connected to nasal cannula oxygen Cardiovascular status: blood pressure returned to baseline and stable Postop Assessment: no apparent nausea or vomiting Anesthetic complications: no    Last Vitals:  Vitals:   02/06/17 0905 02/06/17 0940  BP: 117/64   Pulse: 65   Resp: 19   Temp: 36.7 C   SpO2: 96% 96%    Last Pain:  Vitals:   02/06/17 0945  TempSrc:   PainSc: 0-No pain                 Dontavian Marchi,E. Marlisha Vanwyk

## 2017-02-06 NOTE — Transfer of Care (Signed)
Immediate Anesthesia Transfer of Care Note  Patient: Derrick Moon  Procedure(s) Performed: TRANSESOPHAGEAL ECHOCARDIOGRAM (TEE) (N/A ) CARDIOVERSION (N/A )  Patient Location: Endoscopy Unit  Anesthesia Type:MAC  Level of Consciousness: awake, alert  and oriented  Airway & Oxygen Therapy: Patient Spontanous Breathing and Patient connected to nasal cannula oxygen  Post-op Assessment: Report given to RN and Post -op Vital signs reviewed and stable  Post vital signs: Reviewed and stable  Last Vitals:  Vitals:   02/06/17 0232 02/06/17 0710  BP:  110/76  Pulse:  (!) 126  Resp:  20  Temp:  36.5 C  SpO2: 95% 96%    Last Pain:  Vitals:   02/06/17 0710  TempSrc: Oral  PainSc:       Patients Stated Pain Goal: 0 (16/10/96 0454)  Complications: No apparent anesthesia complications

## 2017-02-06 NOTE — Care Management Important Message (Signed)
Important Message  Patient Details  Name: Derrick Moon MRN: [id number] Date of Birth: 18-Feb-1941   Medicare Important Message Given:  Yes    Nathen May 02/06/2017, 12:43 PM

## 2017-02-06 NOTE — Plan of Care (Signed)
Patient up to restroom, not as winding with ambulation but HR still elevated into the 150's. Cardizem maintained at 20 cc/hr. NPO at midnight for cardioversion. CPAP worn for portion of evening. 4L [place] or bled into CPAP.

## 2017-02-06 NOTE — Anesthesia Preprocedure Evaluation (Addendum)
Anesthesia Evaluation  Patient identified by MRN, date of birth, ID band Patient awake    Reviewed: Allergy & Precautions, NPO status , Patient's Chart, lab work & pertinent test results  History of Anesthesia Complications Negative for: history of anesthetic complications  Airway Mallampati: II  TM Distance: >3 FB Neck ROM: Full    Dental  (+) Dental Advisory Given   Pulmonary sleep apnea (on CPAP in hospital) , COPD,  COPD inhaler, former smoker (recently quit),    breath sounds clear to auscultation       Cardiovascular hypertension, Pt. on medications (-) angina+ Peripheral Vascular Disease (AAA)  + dysrhythmias Atrial Fibrillation  Rhythm:Irregular Rate:Tachycardia  02/04/17 ECHO: EF 65-70%, valves OK   Neuro/Psych  Headaches, Anxiety H/o Bell's palsy    GI/Hepatic Neg liver ROS, GERD  Controlled,  Endo/Other  negative endocrine ROS  Renal/GU negative Renal ROS     Musculoskeletal   Abdominal   Peds  Hematology  (+) Blood dyscrasia (eliquis), ,   Anesthesia Other Findings   Reproductive/Obstetrics                            Anesthesia Physical Anesthesia Plan  ASA: III  Anesthesia Plan: MAC   Post-op Pain Management:    Induction:   PONV Risk Score and Plan: 1 and Treatment may vary due to age or medical condition  Airway Management Planned: Natural Airway and Nasal Cannula  Additional Equipment:   Intra-op Plan:   Post-operative Plan:   Informed Consent: I have reviewed the patients History and Physical, chart, labs and discussed the procedure including the risks, benefits and alternatives for the proposed anesthesia with the patient or authorized representative who has indicated his/her understanding and acceptance.   Dental advisory given  Plan Discussed with: CRNA and Surgeon  Anesthesia Plan Comments: (Plan routine monitors, MAC)        Anesthesia Quick  Evaluation

## 2017-02-06 NOTE — CV Procedure (Signed)
    Electrical Cardioversion Procedure Note VICTOR LANGENBACH [id number] 1940/07/01  Procedure: Electrical Cardioversion Indications:  Atrial Fibrillation  Time Out: Verified patient identification, verified procedure,medications/allergies/relevent history reviewed, required imaging and test results available.  Performed  Procedure Details  The patient was NPO after midnight. Anesthesia was administered at the beside  by Dr.Jackson with propofol.  Cardioversion was performed with synchronized biphasic defibrillation via AP pads with 120 joules.  1 attempt(s) were performed.  The patient converted to normal sinus rhythm. The patient tolerated the procedure well   IMPRESSION:  Successful cardioversion of atrial fibrillation following transesophageal echocardiogram.  TEE demonstrated normal ejection fraction 55%, mild tricuspid regurgitation, mild mitral regurgitation, negative bubble study with no evidence of shunt, no evidence of left atrial appendage thrombus.   Mark Skains 02/06/2017, 9:08 AM

## 2017-02-06 NOTE — Anesthesia Procedure Notes (Signed)
Procedure Name: MAC Date/Time: 02/06/2017 8:24 AM Performed by: Candis Shine, CRNA Pre-anesthesia Checklist: Patient identified, Emergency Drugs available, Suction available and Patient being monitored Patient Re-evaluated:Patient Re-evaluated prior to induction Oxygen Delivery Method: Nasal cannula Dental Injury: Teeth and Oropharynx as per pre-operative assessment

## 2017-02-07 LAB — BASIC METABOLIC PANEL
Anion gap: 7 (ref 5–15)
BUN: 27 mg/dL — AB (ref 6–20)
CHLORIDE: 103 mmol/L (ref 101–111)
CO2: 29 mmol/L (ref 22–32)
Calcium: 9.1 mg/dL (ref 8.9–10.3)
Creatinine, Ser: 0.87 mg/dL (ref 0.61–1.24)
GFR calc Af Amer: >60 mL/min (ref >60)
GLUCOSE: 129 mg/dL — AB (ref 65–99)
POTASSIUM: 4.4 mmol/L (ref 3.5–5.1)
Sodium: 139 mmol/L (ref 135–145)

## 2017-02-07 LAB — CBC
HCT: 44.9 % (ref 39.0–52.0)
Hemoglobin: 14.3 g/dL (ref 13.0–17.0)
MCH: 28.8 pg (ref 26.0–34.0)
MCHC: 31.8 g/dL (ref 30.0–36.0)
MCV: 90.5 fL (ref 78.0–100.0)
PLATELETS: 486 10*3/uL — AB (ref 150–400)
RBC: 4.96 MIL/uL (ref 4.22–5.81)
RDW: 15.3 % (ref 11.5–15.5)
WBC: 23.7 10*3/uL — ABNORMAL HIGH (ref 4.0–10.5)

## 2017-02-07 LAB — TSH: TSH: 0.009 u[IU]/mL — ABNORMAL LOW (ref 0.350–4.500)

## 2017-02-07 LAB — CULTURE, RESPIRATORY W GRAM STAIN: Culture: NORMAL

## 2017-02-07 LAB — CULTURE, RESPIRATORY

## 2017-02-07 LAB — T3, FREE: T3, Free: 2.1 pg/mL (ref 2.0–4.4)

## 2017-02-07 MED ORDER — METHYLPREDNISOLONE SODIUM SUCC 125 MG IJ SOLR: 60.0000 mg | Freq: Two times a day (BID) | INTRAMUSCULAR | Status: DC

## 2017-02-07 MED ORDER — METHYLPREDNISOLONE SODIUM SUCC 40 MG IJ SOLR
40.0000 mg | Freq: Two times a day (BID) | INTRAMUSCULAR | Status: DC
  Administered 2017-02-07 – 2017-02-08 (×2): 40 mg via INTRAVENOUS
  Filled 2017-02-07 (×2): qty 1

## 2017-02-07 MED ORDER — HYDRALAZINE HCL 20 MG/ML IJ SOLN
10.0000 mg | Freq: Four times a day (QID) | INTRAMUSCULAR | Status: DC | PRN
  Administered 2017-02-07 – 2017-02-08 (×3): 10 mg via INTRAVENOUS
  Filled 2017-02-07 (×3): qty 1

## 2017-02-07 NOTE — Progress Notes (Signed)
Maintaining sinus overnight after cardioversion yesterday. No further suggestions from a cardiac standpoint. Follow-up with Dr. Bronson Ing. Cardiology will sign-off. Call with questions.  Pixie Casino, MD, [facility], [place] Director of the [facility]  [facility] Attending Cardiologist  Direct Dial: [phone]  Fax: [phone]  Website:  [email or web address]

## 2017-02-07 NOTE — Progress Notes (Signed)
Pt refused to use NIV at this time, requested that unit be removed from room.  States sleep study post D/C.

## 2017-02-07 NOTE — Progress Notes (Signed)
PROGRESS NOTE    Derrick Moon  POE:[id number] DOB: [birth year] DOA: 02/02/2017 PCP: Derrick Noble, MD   Chief Complaint  Patient presents with  . Shortness of Breath  . Atrial Fibrillation    Brief Narrative:  HPI on 02/02/2017 by Derrick Moon Derrick Moon is a 76 y.o. male with medical history significant of Bell's palsy, bladder cancer, COPD, diverticulitis, BPH, OSA, restless leg syndrome, PAD, hypertension, hiatal hernia, colonic polyps, bronchitis/COPD.  Patient reports approximately 5-6 day history of increasing work of breathing with intermittent productive cough and subjective fevers. Denies any chest pain, palpitations, nausea, vomiting, abdominal pain, dysuria, frequency, neck stiffness, focal neurological deficit. Patient endorses using intermittent home breathing treatments with minimal improvement. Patient states that he was at the point of no longer being able to handle the symptoms which is what brought him into his pulmonologist, Derrick Moon. The pulmonologists office patient was noted to be significantly tachycardic, with O2 saturations in the mid 80s, and in atrial fibrillation and extremities and was sent emergency room via EMS.  Assessment & Plan   Severe sepsis secondary to respiratory infection -Presented with leukocytosis, tachycardia, tachypnea with hypoxemia and hypotension, and AKI -Treatment plan as below -Continue Levaquin -Blood cultures show no growth to date -sputum culture pending; gram stain shows Few GPC in clusters, rare GPR  Acute on chronic respiratory failure with hypoxemia -Likely from purulent tracheobronchitis (per Derrick Moon outpatient note) -Seen by pulmonologist on day of admission -Continue Solu-Medrol, nebulizer treatments, Mucinex, Levaquin, spiriva -Sputum culture as above -respiratory viral panel unremarkable  -patient feels his breathing has improved  -Continue to wean solumedrol -will check oxygen saturations today  Atrial  fibrillation with RVR, new -Heart rate was running between 150s to 170s -Echocardiogram EF60-75%, wall motion was normal, no regional wall motion abnormalities -Cardiology consulted and appreciated -Was started on heparin however transitioned to Eliquis -s/p TEE/DCCV , currently in sinus rhythm -cardiology has signed off, patient will need to follow up with Derrick Moon   Acute kidney injury -Resolved -Creatinine at baseline 0.99, upon admission 1.47 -Currently creatinine 0.87 -Continue monitor BMP  Hypertension -Continue Avapro  BPH -Continue Flomax  Tobacco abuse -Patient smokes 3-4 cigars per day however last use was 10 days prior to admission -Discussed smoking cessation  Abnormal TSH/Hyperthyroidism -?subclinical -TSH 0.014 (low) -FT4 1.32 (slightly elevated), FT3 2.1 -given acute illness, would repeat thyroid function tests in 4-6 weeks and may need outpatient radioactive scan -Will recheck TSH  DVT Prophylaxis  Eliquis  Code Status: Full  Family Communication: None at bedside  Disposition Plan: Admitted, continue to monitor. Possible discharge to home in 24-48 hours  Consultants Cardiology  Procedures  Echocardiogram TEE/DCCV  Antibiotics   Anti-infectives (From admission, onward)   Start     Dose/Rate Route Frequency Ordered Stop   02/06/17 1500  levofloxacin (LEVAQUIN) tablet 500 mg     500 mg Oral Every 24 hours 02/06/17 1322     02/03/17 1500  levofloxacin (LEVAQUIN) IVPB 500 mg  Status:  Discontinued     500 mg 100 mL/hr over 60 Minutes Intravenous Every 24 hours 02/02/17 1806 02/06/17 1322   02/02/17 1345  levofloxacin (LEVAQUIN) IVPB 750 mg     750 mg 100 mL/hr over 90 Minutes Intravenous  Once 02/02/17 1333 02/02/17 1519      Subjective:   Derrick Moon seen and examined today. Feels breathing has improved. Denies chest pain, abdominal pain, nausea, vomiting, diarrhea, constipation, diarrhea, dizziness, headache.   Objective:  Vitals:    02/07/17 0822 02/07/17 0826 02/07/17 0920 02/07/17 1000  BP: (!) 194/99   (!) 148/77  Pulse:  68  80  Resp:    20  Temp:  97.6 F (36.4 C)    TempSrc:  Oral    SpO2:  95% 95% 91%  Weight:      Height:        Intake/Output Summary (Last 24 hours) at 02/07/2017 1055 Last data filed at 02/06/2017 2338 Gross per 24 hour  Intake 1384.67 ml  Output 350 ml  Net 1034.67 ml   Filed Weights   02/03/17 0311 02/05/17 0700 02/07/17 0542  Weight: 87.6 kg (193 lb 2 oz) 91.9 kg (202 lb 9.6 oz) 90.9 kg (200 lb 8 oz)   Exam  General: Well developed, well nourished, NAD, appears stated age  HEENT: NCAT, mucous membranes moist.   Cardiovascular: S1 S2 auscultated, no rubs, murmurs or gallops. Regular rate and rhythm.  Respiratory: Diminished, but clear, no wheezing   Abdomen: Soft, nontender, nondistended, + bowel sounds  Extremities: warm dry without cyanosis clubbing or edema  Neuro: AAOx3, nonfocal  Psych: Normal affect and demeanor with intact judgement and insight  Data Reviewed: I have personally reviewed following labs and imaging studies  CBC: Recent Labs  Lab 02/03/17 0837 02/04/17 0344 02/05/17 0757 02/06/17 0520 02/07/17 0546  WBC 20.6* 25.4* 21.8* 20.5* 23.7*  NEUTROABS  --   --   --  18.3*  --   HGB 14.6 13.4 13.3 13.4 14.3  HCT 44.6 42.4 42.3 42.1 44.9  MCV 91.4 92.2 91.8 90.9 90.5  PLT 369 419* 446* 465* 676*   Basic Metabolic Panel: Recent Labs  Lab 02/03/17 0837 02/04/17 0344 02/05/17 0757 02/06/17 0520 02/07/17 0546  NA 139 141 140 141 139  K 4.0 4.0 4.2 4.4 4.4  CL 107 108 107 106 103  CO2 23 25 26 27 29   GLUCOSE 182* 158* 157* 166* 129*  BUN 23* 34* 25* 25* 27*  CREATININE 1.15 1.12 0.96 1.09 0.87  CALCIUM 9.4 9.7 9.2 9.3 9.1  MG  --   --  2.3  --   --    GFR: Estimated Creatinine Clearance: 82 mL/min (by C-G formula based on SCr of 0.87 mg/dL). Liver Function Tests: Recent Labs  Lab 02/03/17 0837  AST 56*  ALT 51  ALKPHOS 106    BILITOT 0.7  PROT 6.2*  ALBUMIN 2.6*   No results for input(s): LIPASE, AMYLASE in the last 168 hours. No results for input(s): AMMONIA in the last 168 hours. Coagulation Profile: Recent Labs  Lab 02/06/17 0520  INR 1.29   Cardiac Enzymes: No results for input(s): CKTOTAL, CKMB, CKMBINDEX, TROPONINI in the last 168 hours. BNP (last 3 results) No results for input(s): PROBNP in the last 8760 hours. HbA1C: No results for input(s): HGBA1C in the last 72 hours. CBG: No results for input(s): GLUCAP in the last 168 hours. Lipid Profile: No results for input(s): CHOL, HDL, LDLCALC, TRIG, CHOLHDL, LDLDIRECT in the last 72 hours. Thyroid Function Tests: Recent Labs    02/05/17 0759 02/06/17 1049  TSH 0.014*  --   FREET4  --  1.32*  T3FREE  --  2.1   Anemia Panel: No results for input(s): VITAMINB12, FOLATE, FERRITIN, TIBC, IRON, RETICCTPCT in the last 72 hours. Urine analysis:    Component Value Date/Time   COLORURINE YELLOW 09/14/2015 [place] 09/14/2015 0934   LABSPEC 1.028 09/14/2015 [place]  5.0 09/14/2015 0934   GLUCOSEU NEGATIVE 09/14/2015 [place] 09/14/2015 0934   BILIRUBINUR NEGATIVE 09/14/2015 0934   KETONESUR NEGATIVE 09/14/2015 0934   PROTEINUR NEGATIVE 09/14/2015 0934   UROBILINOGEN 0.2 10/06/2008 0800   NITRITE NEGATIVE 09/14/2015 0934   LEUKOCYTESUR NEGATIVE 09/14/2015 0934   Sepsis Labs: @LABRCNTIP (procalcitonin:4,lacticidven:4)  ) Recent Results (from the past 240 hour(s))  Respiratory Panel by PCR     Status: None   Collection Time: 02/02/17  5:26 PM  Result Value Ref Range Status   Adenovirus NOT DETECTED NOT DETECTED Final   Coronavirus 229E NOT DETECTED NOT DETECTED Final   Coronavirus HKU1 NOT DETECTED NOT DETECTED Final   Coronavirus NL63 NOT DETECTED NOT DETECTED Final   Coronavirus OC43 NOT DETECTED NOT DETECTED Final   Metapneumovirus NOT DETECTED NOT DETECTED Final   Rhinovirus / Enterovirus NOT  DETECTED NOT DETECTED Final   Influenza A NOT DETECTED NOT DETECTED Final   Influenza B NOT DETECTED NOT DETECTED Final   Parainfluenza Virus 1 NOT DETECTED NOT DETECTED Final   Parainfluenza Virus 2 NOT DETECTED NOT DETECTED Final   Parainfluenza Virus 3 NOT DETECTED NOT DETECTED Final   Parainfluenza Virus 4 NOT DETECTED NOT DETECTED Final   Respiratory Syncytial Virus NOT DETECTED NOT DETECTED Final   Bordetella pertussis NOT DETECTED NOT DETECTED Final   Chlamydophila pneumoniae NOT DETECTED NOT DETECTED Final   Mycoplasma pneumoniae NOT DETECTED NOT DETECTED Final  MRSA PCR Screening     Status: None   Collection Time: 02/02/17  9:52 PM  Result Value Ref Range Status   MRSA by PCR NEGATIVE NEGATIVE Final    Comment:        The GeneXpert MRSA Assay (FDA approved for NASAL specimens only), is one component of a comprehensive MRSA colonization surveillance program. It is not intended to diagnose MRSA infection nor to guide or monitor treatment for MRSA infections.   Culture, blood (routine x 2)     Status: None (Preliminary result)   Collection Time: 02/04/17  3:15 PM  Result Value Ref Range Status   Specimen Description BLOOD LEFT ANTECUBITAL  Final   Special Requests   Final    BOTTLES DRAWN AEROBIC AND ANAEROBIC Blood Culture adequate volume   Culture NO GROWTH 2 DAYS  Final   Report Status PENDING  Incomplete  Culture, blood (routine x 2)     Status: None (Preliminary result)   Collection Time: 02/04/17  3:30 PM  Result Value Ref Range Status   Specimen Description BLOOD LEFT HAND  Final   Special Requests   Final    BOTTLES DRAWN AEROBIC AND ANAEROBIC Blood Culture adequate volume   Culture NO GROWTH 2 DAYS  Final   Report Status PENDING  Incomplete  Culture, expectorated sputum-assessment     Status: None   Collection Time: 02/05/17 10:46 AM  Result Value Ref Range Status   Specimen Description EXPECTORATED SPUTUM  Final   Special Requests NONE  Final   Sputum  evaluation THIS SPECIMEN IS ACCEPTABLE FOR SPUTUM CULTURE  Final   Report Status 02/05/2017 FINAL  Final  Culture, respiratory (NON-Expectorated)     Status: None (Preliminary result)   Collection Time: 02/05/17 10:46 AM  Result Value Ref Range Status   Specimen Description EXPECTORATED SPUTUM  Final   Special Requests NONE Reflexed from [id number]  Final   Gram Stain   Final    ABUNDANT WBC PRESENT, PREDOMINANTLY PMN RARE SQUAMOUS EPITHELIAL CELLS PRESENT FEW GRAM POSITIVE COCCI  IN PAIRS IN CLUSTERS RARE GRAM POSITIVE RODS    Culture CULTURE REINCUBATED FOR BETTER GROWTH  Final   Report Status PENDING  Incomplete      Radiology Studies: No results found.   Scheduled Meds: . apixaban  5 mg Oral BID  . guaiFENesin  600 mg Oral BID  . irbesartan  150 mg Oral Daily  . levalbuterol  0.63 mg Nebulization Q6H  . levofloxacin  500 mg Oral Q24H  . methylPREDNISolone (SOLU-MEDROL) injection  80 mg Intravenous Q12H  . metoprolol tartrate  25 mg Oral BID  . tamsulosin  0.4 mg Oral BID   Continuous Infusions: . diltiazem (CARDIZEM) infusion Stopped (02/06/17 1014)     LOS: 5 days   Time Spent in minutes   30 minutes  Luca Burston D.O. on 02/07/2017 at 10:55 AM  Between 7am to 7pm - Pager - [phone]  After 7pm go to [email or web address] - password [email or web address]  And look for the night coverage person covering for me after hours  [facility] Office  [phone]

## 2017-02-08 ENCOUNTER — Encounter (HOSPITAL_COMMUNITY): Payer: Self-pay | Admitting: Cardiology

## 2017-02-08 LAB — CBC
HEMATOCRIT: 43 % (ref 39.0–52.0)
Hemoglobin: 13.7 g/dL (ref 13.0–17.0)
MCH: 28.6 pg (ref 26.0–34.0)
MCHC: 31.9 g/dL (ref 30.0–36.0)
MCV: 89.8 fL (ref 78.0–100.0)
PLATELETS: 495 10*3/uL — AB (ref 150–400)
RBC: 4.79 MIL/uL (ref 4.22–5.81)
RDW: 15.2 % (ref 11.5–15.5)
WBC: 19.4 10*3/uL — AB (ref 4.0–10.5)

## 2017-02-08 MED ORDER — METOPROLOL TARTRATE 25 MG PO TABS: 25.0000 mg | ORAL_TABLET | Freq: Two times a day (BID) | ORAL | 0 refills | Status: DC

## 2017-02-08 MED ORDER — OFF THE BEAT BOOK
Freq: Once | Status: AC
  Administered 2017-02-08: 15:00:00
  Filled 2017-02-08: qty 1

## 2017-02-08 MED ORDER — PREDNISONE 10 MG PO TABS: ORAL_TABLET | ORAL | 0 refills | Status: DC

## 2017-02-08 MED ORDER — APIXABAN 5 MG PO TABS: 5.0000 mg | ORAL_TABLET | Freq: Two times a day (BID) | ORAL | 0 refills | Status: AC

## 2017-02-08 MED ORDER — GUAIFENESIN ER 600 MG PO TB12: 600.0000 mg | ORAL_TABLET | Freq: Two times a day (BID) | ORAL | 0 refills | Status: DC

## 2017-02-08 NOTE — Progress Notes (Signed)
Sleep study order form on front of chart. Dr Ree Kida aware and requested to sign. Will fax it and face sheet to [facility] when completed.

## 2017-02-08 NOTE — Evaluation (Signed)
Physical Therapy Evaluation Patient Details Name: Derrick Moon MRN: [id number] DOB: [birth year] Today's Date: 02/08/2017   History of Present Illness  Derrick Moon is a 76 y.o. male admitted 02/02/17 with acute on chronic respiratory failure.   PMH:  Bell's palsy, bladder cancer, COPD, diverticulitis, BPH, OSA, restless leg syndrome, PAD, hypertension, hiatal hernia, colonic polyps, bronchitis/COPD.  Clinical Impression  Patient is functioning at Mod I level with all mobility and gait.  Patient reports he is at his baseline level for mobility.  No further PT needs identified - patient ready for d/c from PT perspective.    Follow Up Recommendations No PT follow up    Equipment Recommendations  None recommended by PT    Recommendations for Other Services       Precautions / Restrictions Precautions Precautions: None Restrictions Weight Bearing Restrictions: No      Mobility  Bed Mobility               General bed mobility comments: Patient in chair as PT entered room  Transfers Overall transfer level: Modified independent Equipment used: None                Ambulation/Gait Ambulation/Gait assistance: Modified independent (Device/Increase time) Ambulation Distance (Feet): 60 Feet Assistive device: None Gait Pattern/deviations: Step-through pattern;Decreased stance time - left;Decreased stride length;Antalgic Gait velocity: decreased Gait velocity interpretation: Below normal speed for age/gender General Gait Details: Patient with slow, steady gait.  Patient with antalgic gait on LLE due to prior injury.  This is his baseline gait pattern.  No loss of balance during gait.  Stairs            Wheelchair Mobility    Modified Rankin (Stroke Patients Only)       Balance Overall balance assessment: No apparent balance deficits (not formally assessed)                                           Pertinent Vitals/Pain Pain  Assessment: 0-10 Pain Score: 8  Pain Location: LLE Pain Descriptors / Indicators: Aching;Sore Pain Intervention(s): Limited activity within patient's tolerance;Monitored during session    [place] expects to be discharged to:: Private residence Living Arrangements: Spouse/significant other Available Help at Discharge: Family;Available 24 hours/day Type of Home: House Home Access: Stairs to enter Entrance Stairs-Rails: Psychiatric nurse of Steps: 3 Home Layout: One level Home Equipment: Walker - 2 wheels;Cane - single point;Bedside commode;Shower seat;Toilet riser;Grab bars - toilet;Grab bars - tub/shower;Wheelchair - manual      Prior Function Level of Independence: Independent         Comments: Drives     Hand Dominance   Dominant Hand: Right    Extremity/Trunk Assessment   Upper Extremity Assessment Upper Extremity Assessment: Overall WFL for tasks assessed    Lower Extremity Assessment Lower Extremity Assessment: Overall WFL for tasks assessed;LLE deficits/detail LLE Deficits / Details: Prior knee injury       Communication   Communication: No difficulties  Cognition Arousal/Alertness: Awake/alert Behavior During Therapy: WFL for tasks assessed/performed Overall Cognitive Status: Within Functional Limits for tasks assessed                                        General Comments      Exercises  Assessment/Plan    PT Assessment Patent does not need any further PT services  PT Problem List         PT Treatment Interventions      PT Goals (Current goals can be found in the Care Plan section)  Acute Rehab PT Goals Patient Stated Goal: To go home PT Goal Formulation: All assessment and education complete, DC therapy    Frequency     Barriers to discharge        Co-evaluation               AM-PAC PT "6 Clicks" Daily Activity  Outcome Measure Difficulty turning over in bed (including  adjusting bedclothes, sheets and blankets)?: None Difficulty moving from lying on back to sitting on the side of the bed? : None Difficulty sitting down on and standing up from a chair with arms (e.g., wheelchair, bedside commode, etc,.)?: None Help needed moving to and from a bed to chair (including a wheelchair)?: None Help needed walking in hospital room?: None Help needed climbing 3-5 steps with a railing? : A Little 6 Click Score: 23    End of Session Equipment Utilized During Treatment: Gait belt Activity Tolerance: Patient tolerated treatment well;Patient limited by pain Patient left: in chair;with call bell/phone within reach;with family/visitor present Nurse Communication: Mobility status(Pt ready for d/c from PT perspective) PT Visit Diagnosis: Other abnormalities of gait and mobility (R26.89);Pain Pain - Right/Left: Left Pain - part of body: Knee    Time: 3299-2426 PT Time Calculation (min) (ACUTE ONLY): 17 min   Charges:   PT Evaluation $PT Eval Low Complexity: 1 Low     PT G Codes:        Carita Pian. Sanjuana Kava, [facility] Acute Rehab Services Pager [place] 02/08/2017, 9:32 PM

## 2017-02-08 NOTE — Progress Notes (Addendum)
SATURATION QUALIFICATIONS: (This note is used to comply with regulatory documentation for home oxygen)  Patient Saturations on Room Air at Rest = 92%  Patient Saturations on Room Air while Ambulating = 86%  Patient Saturations on 2 Liters of oxygen while Ambulating = 95%  Please briefly explain why patient needs home oxygen: Patient desated below 88% on RA while ambulating

## 2017-02-08 NOTE — Care Management Note (Signed)
Case Management Note  Patient Details  Name: Derrick Moon MRN: [id number] Date of Birth: 23-Jun-1940  Subjective/Objective:  76 y.o. M to be discharged with Home Oxygen. CM has alerted both RN, to amend Oxygen note and [facility] rep, Brad, to  Provide DME. No further CM needs.                 Action/Plan:CM will sign off for now but will be available should additional discharge needs arise or disposition change.    Expected Discharge Date:  02/08/17               Expected Discharge Plan:  Home/Self Care  In-House Referral:     Discharge planning Services  CM Consult  Post Acute Care Choice:  Durable Medical Equipment Choice offered to:  Patient  DME Arranged:  Oxygen DME Agency:  [place]:    [facility] Agency:     Status of Service:  Completed, signed off  If discussed at [place] of Stay Meetings, dates discussed:    Additional Comments:  Delrae Sawyers, RN 02/08/2017, 2:09 PM

## 2017-02-08 NOTE — Discharge Summary (Signed)
Physician Discharge Summary  Derrick Moon XBM:[id number] DOB: [birth year] DOA: 02/02/2017  PCP: [name], MD  Admit date: 02/02/2017 Discharge date: 02/08/2017  Time spent: 45 minutes  Recommendations for Outpatient Follow-up:  Patient will be discharged to home with home oxygen.  Patient will need to follow up with primary care provider within one week of discharge, discuss thyroid function testing.  Follow up with Dr. Melvyn Novas, pulmonology. Follow up with Dr. Jacinta Shoe, cardiology, in 1-2 weeks. Patient should continue medications as prescribed.  Patient should follow a heart healthy diet.   Discharge Diagnoses:  Severe sepsis secondary to respiratory infection Acute on chronic respiratory failure with hypoxemia Atrial fibrillation with RVR, new Acute kidney injury Hypertension BPH Tobacco abuse Abnormal TSH/Hyperthyroidism  Discharge Condition: Stable  Diet recommendation: heart healthy  Filed Weights   02/05/17 0700 02/07/17 0542 02/08/17 0628  Weight: 91.9 kg (202 lb 9.6 oz) 90.9 kg (200 lb 8 oz) 93 kg (205 lb 0.4 oz)    History of present illness:  on 02/02/2017 by Dr. [name] a 76 y.o.malewith medical history significant ofBell's palsy, bladder cancer, COPD, diverticulitis, BPH, OSA, restless leg syndrome, PAD, hypertension, hiatal hernia, colonic polyps, bronchitis/COPD.  Patient reports approximately 5-6 day history of increasing work of breathing with intermittent productive cough and subjective fevers. Denies any chest pain, palpitations, nausea, vomiting, abdominal pain, dysuria, frequency, neck stiffness, focal neurological deficit. Patient endorses using intermittent home breathing treatments with minimal improvement. Patient states that he was at the point of no longer being able to handle the symptoms which is what brought him into his pulmonologist, Dr. Melvyn Novas. The pulmonologists office patient was noted to be significantly tachycardic,  with O2 saturations in the mid 80s, and in atrial fibrillation and extremities and was sent emergency room via EMS.  Hospital Course:  Severe sepsis secondary to respiratory infection -Presented with leukocytosis, tachycardia, tachypnea with hypoxemia and hypotension, and AKI -Treatment plan as below -Completed levaquin course  -Blood cultures show no growth to date -sputum culture pending; gram stain shows Few GPC in clusters, rare GPR- normal resp flora  Acute on chronic respiratory failure with hypoxemia -Likely from purulent tracheobronchitis (per Dr. Gustavus Bryant outpatient note) -Seen by pulmonologist on day of admission -was placed onSolu-Medrol, nebulizer treatments, Mucinex, Levaquin, spiriva -Sputum culture as above -respiratory viral panel unremarkable  -patient feels his breathing has improved  -will discharge with prednisone taper -oxygen saturations while on room air at rest 92%, room air during ambulation dropped to 87%, however after rest and a few deep breaths, up to 91%  Atrial fibrillation with RVR, new -Heart rate was running between 150s to 170s -Echocardiogram EF60-75%, wall motion was normal, no regional wall motion abnormalities -Cardiology consulted and appreciated -Was started on heparin however transitioned to Eliquis -s/p TEE/DCCV , currently in sinus rhythm -cardiology has signed off, patient will need to follow up with Dr. Jacinta Shoe   Acute kidney injury -Resolved -Creatinine at baseline 0.99, upon admission 1.47 -Currently creatinine 0.87  Hypertension -Continue Avapro  BPH -Continue Flomax  Tobacco abuse -Patient smokes 3-4 cigars per day however last use was 10 days prior to admission -Discussed smoking cessation  Abnormal TSH/Hyperthyroidism -?subclinical -TSH 0.014 (low) -FT4 1.32 (slightly elevated), FT3 2.1 -given acute illness, would repeat thyroid function tests in 4-6 weeks and may need outpatient radioactive  scan  Consultants Cardiology  Procedures  Echocardiogram TEE/DCCV  Discharge Exam: Vitals:   02/08/17 0544 02/08/17 0628  BP: (!) 182/94 (!) 146/66  Pulse:  79  Resp:  19  Temp: 97.9 F (36.6 C)   SpO2:  (!) 87%   Feels breathing has improved. Still has productive cough. Denies chest pain, shortness of breath, abdominal pain, N/V/D/C, dizziness, headache.    General: Well developed, well nourished, NAD, appears stated age  34: NCAT,mucous membranes moist.  Cardiovascular: S1 S2 auscultated, no rubs, murmurs or gallops. Regular rate and rhythm.  Respiratory: Diminished but clear, productive cough  Abdomen: Soft, nontender, nondistended, + bowel sounds  Extremities: warm dry without cyanosis clubbing or edema  Neuro: AAOx3, nonfocal  Psych: Normal affect and demeanor with intact judgement and insight  Discharge Instructions  Current Discharge Medication List    CONTINUE these medications which have NOT CHANGED   Details  acetaminophen (TYLENOL) 500 MG tablet Take 1,000 mg every 6 (six) hours as needed by mouth.    albuterol (PROVENTIL HFA;VENTOLIN HFA) 108 (90 BASE) MCG/ACT inhaler Inhale 1-2 puffs into the lungs every 6 (six) hours as needed for wheezing or shortness of breath. Reported on 10/03/2015    aspirin EC 81 MG tablet Take 1 tablet (81 mg total) by mouth 2 (two) times daily. Qty: 60 tablet, Refills: 0    diphenhydrAMINE (BENADRYL) 25 MG tablet Take 25 mg by mouth every 6 (six) hours as needed.    Glycopyrrolate-Formoterol (BEVESPI AEROSPHERE) 9-4.8 MCG/ACT AERO Inhale 2 puffs into the lungs 2 (two) times daily. Qty: 1 Inhaler, Refills: 11    irbesartan (AVAPRO) 150 MG tablet Take 1 tablet (150 mg total) by mouth daily. Qty: 30 tablet, Refills: 11    Tamsulosin HCl (FLOMAX) 0.4 MG CAPS Take 0.4 mg by mouth 2 (two) times daily.     Tiotropium Bromide Monohydrate (SPIRIVA RESPIMAT) 2.5 MCG/ACT AERS Inhale 2 puffs into the lungs daily. Qty: 1  Inhaler, Refills: 11    methocarbamol (ROBAXIN) 500 MG tablet Take 1 tablet (500 mg total) by mouth 3 (three) times daily as needed. Qty: 60 tablet, Refills: 1       Allergies  Allergen Reactions  . Aspartame And Phenylalanine Nausea Only  . Other Rash    Pt reports rash on back, buttocks, shoulders and stomach after arteriogram  . Sulfonamide Derivatives Nausea And Vomiting      The results of significant diagnostics from this hospitalization (including imaging, microbiology, ancillary and laboratory) are listed below for reference.    Significant Diagnostic Studies: Ct Angio Chest Pe W And/or Wo Contrast  Result Date: 02/02/2017 CLINICAL DATA:  76 year old male with history of shortness of breath and weakness. EXAM: CT ANGIOGRAPHY CHEST WITH CONTRAST TECHNIQUE: Multidetector CT imaging of the chest was performed using the standard protocol during bolus administration of intravenous contrast. Multiplanar CT image reconstructions and MIPs were obtained to evaluate the vascular anatomy. CONTRAST:  158mL ISOVUE-370 IOPAMIDOL (ISOVUE-370) INJECTION 76% COMPARISON:  Chest CT 06/29/2008. FINDINGS: Cardiovascular: Study is limited by considerable respiratory motion such that subsegmental and distal segmental sized emboli cannot be completely excluded in certain portions of the lungs, most notably in the lower lobes of the lungs bilaterally. With these limitations in mind, there is no central, lobar or proximal segmental sized filling defect to suggest clinically relevant pulmonary embolism. Heart size is borderline enlarged. There is no significant pericardial fluid, thickening or pericardial calcification. There is aortic atherosclerosis, as well as atherosclerosis of the great vessels of the mediastinum and the coronary arteries, including calcified atherosclerotic plaque in the left anterior descending and right coronary arteries. Mediastinum/Nodes: Multiple borderline enlarged and enlarged  mediastinal and bilateral hilar lymph nodes are noted, with the largest of these measuring up to 1.9 cm in the right infrahilar region, and up to 1.3 cm in the low right paratracheal nodal station. Esophagus is unremarkable in appearance. No axillary lymphadenopathy. Lungs/Pleura: Diffuse bronchial wall thickening with scattered areas of more severe thickening of the peribronchovascular interstitium with some mild cylindrical bronchiectasis and extensive regions of mucoid impaction, most evident in the lower lobes of the lungs bilaterally. These areas of greatest involvement also demonstrate associated peripheral peribronchovascular septal thickening and architectural distortion which is poorly evaluated on today's motion limited examination. There is also a background of mild centrilobular and paraseptal emphysema. No pleural effusions. Upper Abdomen: Aortic atherosclerosis. Status post cholecystectomy. Diffuse low attenuation throughout the visualized hepatic parenchyma, indicative of hepatic steatosis. Diffuse low-attenuation enlargement (-2 HU) of the left adrenal gland, indicative of adenomatous hyperplasia, similar in retrospect to prior study from 2010. Musculoskeletal: There are no aggressive appearing lytic or blastic lesions noted in the visualized portions of the skeleton. Review of the MIP images confirms the above findings. IMPRESSION: 1. Despite the limitations of today's examination there is no evidence to suggest central, lobar or proximal segmental sized pulmonary embolism. More distal segmental and subsegmental sized emboli cannot be entirely excluded, particularly in the lower lobes of the lungs bilaterally. 2. However, despite the limitations of today's examination there is a plausible explanation for the patient shortness of breath. Specifically, there is evidence of probable chronic atypical infection, as discussed above. Alternatively, findings could reflect a severe acute bronchitis with  early developing bronchopneumonia most evident in the right lower lobe. Further evaluation by pulmonology is recommended in the near future. 3. Extensive mediastinal and bilateral hilar lymphadenopathy. This could be reactive in the setting of chronic atypical infection, or could be indicative of underlying lymphoproliferative disorder. Alternative diagnosis such as sarcoidosis could also be considered, but is not strongly favored at this time. 4. Aortic atherosclerosis, in addition to 2 vessel coronary artery disease. Assessment for potential risk factor modification, dietary therapy or pharmacologic therapy may be warranted, if clinically indicated. 5. Mild centrilobular and paraseptal emphysema. 6. Hepatic steatosis. 7. Additional incidental findings, as above. Aortic Atherosclerosis (ICD10-I70.0) and Emphysema (ICD10-J43.9). Electronically Signed   By: Vinnie Langton M.D.   On: 02/02/2017 14:52   Dg Chest Portable 1 View  Result Date: 02/02/2017 CLINICAL DATA:  Short of breath EXAM: PORTABLE CHEST 1 VIEW COMPARISON:  12/12/2016 FINDINGS: Interval development of patchy bibasilar airspace disease. Lungs were clear previously. COPD with hyperinflation. Negative for heart failure or effusion. Right shoulder replacement IMPRESSION: COPD.  Interval development of bibasilar atelectasis/pneumonia. Electronically Signed   By: Franchot Gallo M.D.   On: 02/02/2017 13:39    Microbiology: Recent Results (from the past 240 hour(s))  Respiratory Panel by PCR     Status: None   Collection Time: 02/02/17  5:26 PM  Result Value Ref Range Status   Adenovirus NOT DETECTED NOT DETECTED Final   Coronavirus 229E NOT DETECTED NOT DETECTED Final   Coronavirus HKU1 NOT DETECTED NOT DETECTED Final   Coronavirus NL63 NOT DETECTED NOT DETECTED Final   Coronavirus OC43 NOT DETECTED NOT DETECTED Final   Metapneumovirus NOT DETECTED NOT DETECTED Final   Rhinovirus / Enterovirus NOT DETECTED NOT DETECTED Final   Influenza  A NOT DETECTED NOT DETECTED Final   Influenza B NOT DETECTED NOT DETECTED Final   Parainfluenza Virus 1 NOT DETECTED NOT DETECTED Final   Parainfluenza Virus 2 NOT DETECTED  NOT DETECTED Final   Parainfluenza Virus 3 NOT DETECTED NOT DETECTED Final   Parainfluenza Virus 4 NOT DETECTED NOT DETECTED Final   Respiratory Syncytial Virus NOT DETECTED NOT DETECTED Final   Bordetella pertussis NOT DETECTED NOT DETECTED Final   Chlamydophila pneumoniae NOT DETECTED NOT DETECTED Final   Mycoplasma pneumoniae NOT DETECTED NOT DETECTED Final  MRSA PCR Screening     Status: None   Collection Time: 02/02/17  9:52 PM  Result Value Ref Range Status   MRSA by PCR NEGATIVE NEGATIVE Final    Comment:        The GeneXpert MRSA Assay (FDA approved for NASAL specimens only), is one component of a comprehensive MRSA colonization surveillance program. It is not intended to diagnose MRSA infection nor to guide or monitor treatment for MRSA infections.   Culture, blood (routine x 2)     Status: None (Preliminary result)   Collection Time: 02/04/17  3:15 PM  Result Value Ref Range Status   Specimen Description BLOOD LEFT ANTECUBITAL  Final   Special Requests   Final    BOTTLES DRAWN AEROBIC AND ANAEROBIC Blood Culture adequate volume   Culture NO GROWTH 4 DAYS  Final   Report Status PENDING  Incomplete  Culture, blood (routine x 2)     Status: None (Preliminary result)   Collection Time: 02/04/17  3:30 PM  Result Value Ref Range Status   Specimen Description BLOOD LEFT HAND  Final   Special Requests   Final    BOTTLES DRAWN AEROBIC AND ANAEROBIC Blood Culture adequate volume   Culture NO GROWTH 4 DAYS  Final   Report Status PENDING  Incomplete  Culture, expectorated sputum-assessment     Status: None   Collection Time: 02/05/17 10:46 AM  Result Value Ref Range Status   Specimen Description EXPECTORATED SPUTUM  Final   Special Requests NONE  Final   Sputum evaluation THIS SPECIMEN IS ACCEPTABLE FOR  SPUTUM CULTURE  Final   Report Status 02/05/2017 FINAL  Final  Culture, respiratory (NON-Expectorated)     Status: None   Collection Time: 02/05/17 10:46 AM  Result Value Ref Range Status   Specimen Description EXPECTORATED SPUTUM  Final   Special Requests NONE Reflexed from [id number]  Final   Gram Stain   Final    ABUNDANT WBC PRESENT, PREDOMINANTLY PMN RARE SQUAMOUS EPITHELIAL CELLS PRESENT FEW GRAM POSITIVE COCCI IN PAIRS IN CLUSTERS RARE GRAM POSITIVE RODS    Culture Consistent with normal respiratory flora.  Final   Report Status 02/07/2017 FINAL  Final     Labs: Basic Metabolic Panel: Recent Labs  Lab 02/03/17 0837 02/04/17 0344 02/05/17 0757 02/06/17 0520 02/07/17 0546  NA 139 141 140 141 139  K 4.0 4.0 4.2 4.4 4.4  CL 107 108 107 106 103  CO2 23 25 26 27 29   GLUCOSE 182* 158* 157* 166* 129*  BUN 23* 34* 25* 25* 27*  CREATININE 1.15 1.12 0.96 1.09 0.87  CALCIUM 9.4 9.7 9.2 9.3 9.1  MG  --   --  2.3  --   --    Liver Function Tests: Recent Labs  Lab 02/03/17 0837  AST 56*  ALT 51  ALKPHOS 106  BILITOT 0.7  PROT 6.2*  ALBUMIN 2.6*   No results for input(s): LIPASE, AMYLASE in the last 168 hours. No results for input(s): AMMONIA in the last 168 hours. CBC: Recent Labs  Lab 02/04/17 0344 02/05/17 0757 02/06/17 0520 02/07/17 0546 02/08/17 0438  WBC 25.4*  21.8* 20.5* 23.7* 19.4*  NEUTROABS  --   --  18.3*  --   --   HGB 13.4 13.3 13.4 14.3 13.7  HCT 42.4 42.3 42.1 44.9 43.0  MCV 92.2 91.8 90.9 90.5 89.8  PLT 419* 446* 465* 486* 495*   Cardiac Enzymes: No results for input(s): CKTOTAL, CKMB, CKMBINDEX, TROPONINI in the last 168 hours. BNP: BNP (last 3 results) No results for input(s): BNP in the last 8760 hours.  ProBNP (last 3 results) No results for input(s): PROBNP in the last 8760 hours.  CBG: No results for input(s): GLUCAP in the last 168 hours.     Signed:  Cristal Ford  [facility] 02/08/2017, 12:10 PM

## 2017-02-09 LAB — CULTURE, BLOOD (ROUTINE X 2)
CULTURE: NO GROWTH
CULTURE: NO GROWTH
SPECIAL REQUESTS: ADEQUATE
Special Requests: ADEQUATE

## 2017-02-09 NOTE — Progress Notes (Signed)
Pt d/c home in stable condition.  Wife at bedside.  All d/c paperwork and prescriptions given to pt.  He understands to fill Eliquis (using 30 day free card) and metoprolol tonight at 24 hr RX, as his is closed on Sundays.  Everything (Meds, f/u appointments, Rx)  discussed in detail and all questions answered.  [facility] has delivered pt HOme Oxygen tank.  All belongings taken with pt.

## 2017-02-11 DIAGNOSIS — R0602 Shortness of breath: Secondary | ICD-10-CM | POA: Diagnosis not present

## 2017-02-11 DIAGNOSIS — J9620 Acute and chronic respiratory failure, unspecified whether with hypoxia or hypercapnia: Secondary | ICD-10-CM | POA: Diagnosis not present

## 2017-02-11 NOTE — Telephone Encounter (Signed)
PA for Derrick Moon has been approved. Called pharmacy to make aware.

## 2017-02-19 ENCOUNTER — Ambulatory Visit: Payer: PPO | Admitting: Internal Medicine

## 2017-02-19 ENCOUNTER — Encounter: Payer: Self-pay | Admitting: Internal Medicine

## 2017-02-19 VITALS — BP 110/70 | HR 64 | Ht 71.0 in | Wt 189.2 lb

## 2017-02-19 DIAGNOSIS — R918 Other nonspecific abnormal finding of lung field: Secondary | ICD-10-CM | POA: Diagnosis not present

## 2017-02-19 DIAGNOSIS — J9601 Acute respiratory failure with hypoxia: Secondary | ICD-10-CM | POA: Diagnosis not present

## 2017-02-19 DIAGNOSIS — J449 Chronic obstructive pulmonary disease, unspecified: Secondary | ICD-10-CM | POA: Diagnosis not present

## 2017-02-19 MED ORDER — FAMOTIDINE 20 MG PO TABS: ORAL_TABLET | ORAL | 2 refills | Status: DC

## 2017-02-19 MED ORDER — PANTOPRAZOLE SODIUM 40 MG PO TBEC: 40.0000 mg | DELAYED_RELEASE_TABLET | Freq: Every day | ORAL | 2 refills | Status: DC

## 2017-02-19 NOTE — Progress Notes (Signed)
Subjective:     Patient ID: Derrick Moon, male   DOB: [birth year]   MRN: [id number]    Brief patient profile:  62 yowm quit smoking cigs 1990 / still smoking some cigars until 01/23/17  referred to pulmonary clinic 12/12/2015 by Dr   Derrick Moon for sob s/p neg cards eval by Dr Derrick Moon (though doen'st look like pt completed the myocardial perfusion study scheduled for 08/15/15) with COPD GOLD IIdx      .History of Present Illness  12/12/2015 1st [facility] office visit/ Derrick Moon   Chief Complaint  Patient presents with  . Pulmonary Consult    Referred by Dr. [name]. Pt c/o dizziness with exertion for the past 6 months. His spouse states after he has been outside working, he comes in SOB.  He has prod cough with clear sputum in the am's.   variably noisy breathing x 6 months seems to bother wife more than husband  MMRC2 = can't walk a nl pace on a flat grade s sob but does fine slow and flat eg grocery shopping  Sleeping fine off cpap but wakes up every am with throat congestion/ uses halls  spiriva not helping / hfa may help/ prednisone does not help rec avapro  150 mg one daily - if light headed when you stand up break it in half  Stop spiriva and lisinopril and the halls for now GERD diet Only use your albuterol as a rescue medication     01/09/2016  f/u ov/Derrick Moon re:  GOLD II, no maint rx/using saba bid most days / still smoking cigars  Chief Complaint  Patient presents with  . Shortness of Breath    Breathing is improved since last OV. Reports slight SOB and chest congestion. Denies  chest tightness, wheezing or coughing.   tends to needVentolin first thing am for am "congestion" Can now walk the dog s sob/ cp = MMRC2 = can't walk a nl pace on a flat grade s sob but does fine slow and flat eg shopping/ walking the dog  rec Plan A = Automatic = Symbicort 160 Take 2 puffs first thing in am and then another 2 puffs about 12 hours later.  Plan B = Backup Only use your albuterol as  a rescue medication     12/12/2016  f/u ov/Derrick Moon re: copd GOLD II/ symb 160 2bid and daily albuterol around 2 pm Chief Complaint  Patient presents with  . Follow-up    Pt states he is here for his annual followup.  He states his breathing is "pretty good".  He states he was seen by his PCP on 12/03/16 and was dxed with lung infection- txed with pred and doxy.  He has some cough with large amounts of clear sputum.  He is using his albuterol inhaler 1 x daily on average.   worse for several months cough/ congestion esp after supper, does ok at hs/noct  But wakes up most  ams with  One tbsp thick mucus while on symbicort rx pred/doxy > 50% improved / still smoking mostly cigars  Much better breathing  indoors vs outdoors but still mmrc = 2  Needing benadryl to sleep due to sense of pnds   Needing sab at least once daily  rec Plan A = Automatic = symbicort 160 x2 puffs followed by spiriva 2 puffs first thing in am then 12 hours later 2 pffs symbicort only Work on inhaler technique:  relax and gently blow all the way out then  take a nice smooth deep breath back in, triggering the inhaler at same time you start breathing in.  Hold for up to 5 seconds if you can. Blow out thru nose. Rinse and gargle with water when done Plan B = Backup Only use your albuterol as a rescue medication Please remember to go to the  x-ray department downstairs in the basement  for your tests - we will call you with the results when they are available. The key is to stop smoking completely before smoking completely stops you!     01/08/2017  f/u ov/Derrick Moon re:  GOLD II copd on just prn saba  Chief Complaint  Patient presents with  . Follow-up    Breathing has improved some.  He stopped Symbicort due to "too much congestion" and since he stopped med he feels better. He is using his albuterol inhaler once daily on average.   took ventolin x one puff x 3.5 hours prior to OV   Sleeps poorly due to shoulder pain / no am cough  /congestion  No better on symb/spiriva so stopped both  - says can't walk across parking lot s stopping due to sob x one year, no better on symb or spiriva or combo of the two but ventolin really helps (note hfa quite poor)  Doe = MMRC3 = can't walk 100 yards even at a slow pace at a flat grade s stopping due to sob   rec Plan A = Automatic = Bevespi Take 2 puffs first thing in am and then another 2 puffs about 12 hours later.   Plan B = Backup Only use your albuterol (Ventolin) as a rescue   Please schedule a follow up office visit in 6 weeks    02/02/2017 acute extended ov/Derrick Moon re:  Chief Complaint  Patient presents with  . Acute Visit    Increased SOB and cough for the past 2 wks. He is coughing up yellow sputum.    onset  01/24/17 sob/cough getting worse while on bevespi and cut down down saba  Started coughing up dark yellow mucus at onset and gradually worse sob to point where sob at rest and unable to lie in bed with severe coughing spasms/ confused with details of care ? On bevespi an spiriva? Plus saba up to every 4 hours  rec Go to er      Admit date: 02/02/2017 Discharge date: 02/08/2017     Recommendations for Outpatient Follow-up:  Patient will be discharged to home with home oxygen.  Patient will need to follow up with primary care provider within one week of discharge, discuss thyroid function testing.  Follow up with Dr. Melvyn Moon, pulmonology. Follow up with Dr. Jacinta Moon, cardiology, in 1-2 weeks. Patient should continue medications as prescribed.  Patient should follow a heart healthy diet.   Discharge Diagnoses:  Severe sepsis secondary to respiratory infection Acute on chronic respiratory failure with hypoxemia Atrial fibrillation with RVR, new Acute kidney injury Hypertension BPH Tobacco abuse Abnormal TSH/Hyperthyroidism         Filed Weights   02/05/17 0700 02/07/17 0542 02/08/17 0628  Weight: 91.9 kg (202 lb 9.6 oz) 90.9 kg (200 lb 8 oz) 93 kg  (205 lb 0.4 oz)    History of present illness:  on 02/02/2017 by Dr. [name] a 76 y.o.malewith medical history significant ofBell's palsy, bladder cancer, COPD, diverticulitis, BPH, OSA, restless leg syndrome, PAD, hypertension, hiatal hernia, colonic polyps, bronchitis/COPD.  Patient reports approximately 5-6 day history  of increasing work of breathing with intermittent productive cough and subjective fevers.   In  pulmonologists office patient was noted to be significantly tachycardic, with O2 saturations in the mid 80s, and in atrial fibrillation and  was sent emergency room via EMS.  Hospital Course:  Severe sepsis secondary to respiratory infection -Presented with leukocytosis, tachycardia, tachypnea with hypoxemia and hypotension, and AKI -Treatment plan as below -Completed levaquin course  -Blood cultures show no growth to date -sputum culture pending; gram stain shows Few GPC in clusters, rare GPR- normal resp flora  Acute on chronic respiratory failure with hypoxemia -Likely from purulent tracheobronchitis (per Dr. Gustavus Bryant outpatient note) -Seen by pulmonologist on day of admission -was placed onSolu-Medrol, nebulizer treatments, Mucinex, Levaquin, spiriva -Sputum culture as above -respiratory viral panel unremarkable  -patient feels his breathing has improved -will discharge with prednisone taper -oxygen saturations while on room air at rest 92%, room air during ambulation dropped to 87%, however after rest and a few deep breaths, up to 91%  Atrial fibrillation with RVR, new -Heart rate was running between 150s to 170s -Echocardiogram EF60-75%, wall motion was normal, no regional wall motion abnormalities -Cardiology consulted and appreciated -Was started on heparin however transitioned to Eliquis -s/p TEE/DCCV, currently in sinus rhythm -cardiology has signed off, patient will need to follow up with Dr. Jacinta Moon  Acute kidney  injury -Resolved -Creatinine at baseline 0.99, upon admission 1.47 -Currently creatinine0.87  Hypertension -Continue Avapro  BPH -Continue Flomax  Tobacco abuse -Patient smokes 3-4 cigars per day however last use was 10 days prior to admission -Discussed smoking cessation  Abnormal TSH/Hyperthyroidism -?subclinical -TSH 0.014 (low) -FT4 1.32 (slightly elevated), FT3 2.1 -given acute illness, would repeat thyroid function tests in 4-6 weeks and may need outpatient radioactive scan  Consultants Cardiology  Procedures  Echocardiogram TEE/DCCV      02/19/2017   Post hosp ext f/u ov/Kacy Conely re: transition of care / on 2lpm at hs/ one or two prednisone left in bottle  Chief Complaint  Patient presents with  . Follow-up    Breathing has improved some, but not back to baseline yet. He states "I feel like I'm suffocating" with exertion and he can not lie down flat.   doe = MMRC2 = can't walk a nl pace on a flat grade s sob but does fine slow and flat eg [employer]  Sleeping in recliner on 3lpm maybe 30 degrees x years No need for saba since d/c but sore throat on both spiriva and bevespi   No obvious day to day or daytime variability or assoc excess/ purulent sputum or mucus plugs or hemoptysis or cp or chest tightness, subjective wheeze or overt sinus or hb symptoms. No unusual exposure hx or h/o childhood pna/ asthma or knowledge of premature birth.  Sleeping ok in recliner on 2lpm nocturnal  or early am exacerbation  of respiratory  c/o's or need for noct saba. Also denies any obvious fluctuation of symptoms with weather or environmental changes or other aggravating or alleviating factors except as outlined above   Current Allergies, Complete Past Medical History, Past Surgical History, Family History, and Social History were reviewed in [employer] record.  ROS  The following are not active complaints unless bolded Hoarseness, sore throat,  dysphagia, dental problems, itching, sneezing,  nasal congestion or discharge of excess mucus or purulent secretions, ear ache,   fever, chills, sweats, unintended wt loss or wt gain, classically pleuritic or exertional cp,  orthopnea pnd or  leg swelling, presyncope, palpitations, abdominal pain, anorexia, nausea, vomiting, diarrhea  or change in bowel habits or change in bladder habits, change in stools or change in urine, dysuria, hematuria,  rash, arthralgias, visual complaints, headache, numbness, weakness or ataxia or problems with walking or coordination,  change in mood/affect or memory.        Current Meds  Medication Sig  . acetaminophen (TYLENOL) 500 MG tablet Take 1,000 mg every 6 (six) hours as needed by mouth.  [name] albuterol (PROVENTIL HFA;VENTOLIN HFA) 108 (90 BASE) MCG/ACT inhaler Inhale 1-2 puffs into the lungs every 6 (six) hours as needed for wheezing or shortness of breath. Reported on 10/03/2015  . apixaban (ELIQUIS) 5 MG TABS tablet Take 1 tablet (5 mg total) 2 (two) times daily by mouth.  . diphenhydrAMINE (BENADRYL) 25 MG tablet Take 25 mg by mouth every 6 (six) hours as needed.  . Glycopyrrolate-Formoterol (BEVESPI AEROSPHERE) 9-4.8 MCG/ACT AERO Inhale 2 puffs into the lungs 2 (two) times daily.  [name] guaiFENesin (MUCINEX) 600 MG 12 hr tablet Take 1 tablet (600 mg total) 2 (two) times daily by mouth.  . irbesartan (AVAPRO) 150 MG tablet Take 1 tablet (150 mg total) by mouth daily.  . metoprolol tartrate (LOPRESSOR) 25 MG tablet Take 1 tablet (25 mg total) 2 (two) times daily by mouth.  . OXYGEN 2lpm with sleep and if needed during the daytime  . predniSONE (DELTASONE) 10 MG tablet Take 4 tabs x 3 days, then 3 tabs x 3 days, then 2 tabs x 3days, then 1 tab x 3days  . Tamsulosin HCl (FLOMAX) 0.4 MG CAPS Take 0.4 mg by mouth 2 (two) times daily.   .   Tiotropium Bromide Monohydrate (SPIRIVA RESPIMAT) 2.5 MCG/ACT AERS Inhale 2 puffs into the lungs daily.                  Objective:   Physical Exam  Obese hoarse amb wm nad    02/20/2017     189 02/02/2017      189  12/12/2016        201  01/09/2016      198   12/12/15 198 lb (89.8 kg)  11/09/15 201 lb 12.8 oz (91.5 kg)  10/03/15 199 lb (90.3 kg)     Vital signs reviewed - Note on arrival 02 sats  94% on RA      HEENT:  poor dentition, nl  turbinates bilaterally, and oropharynx. Nl external ear canals without cough reflex   NECK :  without JVD/Nodes/TM/ nl carotid upstrokes bilaterally   LUNGS: no acc muscle use,  Nl contour chest with minimal insp/ exp rhonchi bilaterally    CV:  RRR  no s3 or murmur or increase in P2, and no edema   ABD:  soft and nontender with nl inspiratory excursion in the supine position. No bruits or organomegaly appreciated, bowel sounds nl  MS:  Nl gait/ ext warm without deformities, calf tenderness, cyanosis or clubbing No obvious joint restrictions   SKIN: warm and dry with serpiginous vericosities/ mild venous insuff changes both lower ext   NEURO:  alert, approp, nl sensorium with  no motor or cerebellar deficits apparent.     I personally reviewed images and agree with radiology impression as follows:   Chest CTa 02/02/17 1. Despite the limitations of today's examination there is no evidence to suggest central, lobar or proximal segmental sized pulmonary embolism. More distal segmental and subsegmental sized emboli cannot be entirely excluded, particularly in  the lower lobes of the lungs bilaterally. 2. However, despite the limitations of today's examination there is a plausible explanation for the patient shortness of breath. Specifically, there is evidence of probable chronic atypical infection, as discussed above. Alternatively, findings could reflect a severe acute bronchitis with early developing bronchopneumonia most evident in the right lower lobe. Further evaluation by pulmonology is recommended in the near future. 3. Extensive mediastinal  and bilateral hilar lymphadenopathy. This could be reactive in the setting of chronic atypical infection, or could be indicative of underlying lymphoproliferative disorder.        Assessment:

## 2017-02-19 NOTE — Patient Instructions (Addendum)
Goal is to keep your 02 sats above 90% at all times so continue 02 2lpm at bedtime and as needed during the day but you need to monitor it when you walk/ exert to be sure your saturations are adequate   Pantoprazole (protonix) 40 mg   Take  30-60 min before first meal of the day and Pepcid (famotidine)  20 mg one @  bedtime until return to office - this is the best way to tell whether stomach acid is contributing to your problem.    GERD (REFLUX)  is an extremely common cause of respiratory symptoms just like yours , many times with no obvious heartburn at all.    It can be treated with medication, but also with lifestyle changes including elevation of the head of your bed (ideally with 6 inch  bed blocks),  Smoking cessation, avoidance of late meals, excessive alcohol, and avoid fatty foods, chocolate, peppermint, colas, red wine, and acidic juices such as orange juice.  NO MINT OR MENTHOL PRODUCTS SO NO COUGH DROPS   USE SUGARLESS CANDY INSTEAD (Jolley ranchers or Stover's or Life Savers) or even ice chips will also do - the key is to swallow to prevent all throat clearing. NO OIL BASED VITAMINS - use powdered substitutes.    Please schedule a follow up office visit in 6 weeks, call sooner if needed with all medications /inhalers/ solutions in hand so we can verify exactly what you are taking. This includes all medications from all doctors and over the counters

## 2017-02-20 ENCOUNTER — Encounter: Payer: Self-pay | Admitting: Internal Medicine

## 2017-02-20 DIAGNOSIS — I4891 Unspecified atrial fibrillation: Secondary | ICD-10-CM | POA: Diagnosis not present

## 2017-02-20 DIAGNOSIS — R918 Other nonspecific abnormal finding of lung field: Secondary | ICD-10-CM | POA: Insufficient documentation

## 2017-02-20 DIAGNOSIS — J181 Lobar pneumonia, unspecified organism: Secondary | ICD-10-CM | POA: Diagnosis not present

## 2017-02-20 DIAGNOSIS — E0590 Thyrotoxicosis, unspecified without thyrotoxic crisis or storm: Secondary | ICD-10-CM | POA: Diagnosis not present

## 2017-02-20 NOTE — Assessment & Plan Note (Addendum)
-   PFTs  05/27/12   FEV1  2.04(62%) ratio 54  - 12/12/2015 try off spiriva since pt says not working > increased need for saba - Spirometry 01/09/2016  FEV1 1.19 (47%)  Ratio 50 p nothing prior and classic curvature on f/v loop - 01/09/2016  After extensive coaching HFA effectiveness =    75% try symbicort 160 2bid   - 12/12/2016  After extensive coaching devie effectiveness =    75% with smi > add spiriva 2 each am  - Spirometry 01/08/2017  FEV1 1.52 (48%)  Ratio 59    - 01/08/2017 trial of bevespi   - 02/19/2017  After extensive coaching HFA effectiveness =    75%    Ct c/w pna vs chf (note no BNP at admit) but clearly improving at this point back to no desat RA  Will see how he looks off the prednisone and just on bevespi and if flares change back to symb/spiriva   I had an extended discussion with the patient reviewing all relevant studies completed to date and  lasting 25 minutes of a 40  minute post hosp transition of care office  visit     re  severe non-specific but potentially very serious refractory respiratory symptoms of uncertain and potentially multiple  etiologies.   Each maintenance medication was reviewed in detail including most importantly the difference between maintenance and prns and under what circumstances the prns are to be triggered using an action plan format that is not reflected in the computer generated alphabetically organized AVS.    Please see AVS for specific instructions unique to this office visit that I personally wrote and verbalized to the the pt in detail and then reviewed with pt  by my nurse highlighting any changes in therapy/plan of care  recommended at today's visit.

## 2017-02-20 NOTE — Assessment & Plan Note (Signed)
ddx for diffuse infiltrates on cxr = MAYONIDPESHI  From the [place] [facility]    Miscellaneous:Alv microlithiasis, alv proteinosis, asp, bronchiectais, BOOP  (no ESR done/ better on prednisone clinically > repeat esr/ cxr if worse off it)  ARDS/ AIP Occupational dz/ HSP Neoplasm Infection/ esp viral (all cultures neg to date)  Drug  Pulmonary emboli> neg CTa/ now on eliquis so low risk   Protein disorders Edema  ( no bnp at admit/ improved p empirical diuresis/ f/u cards planned)  Eosinophilic dz Sarcoidosis Connective tissue dz Hist X / Hemorrhage Idiopathic    Since present on plain film just needs f/u pa and lat cxr at next ov with bnp/esr if flares in interim

## 2017-02-20 NOTE — Assessment & Plan Note (Signed)
Resolved at least on RA but needs to continue 02 hs and prn if sats < 90 with exertion

## 2017-02-24 DIAGNOSIS — L851 Acquired keratosis [keratoderma] palmaris et plantaris: Secondary | ICD-10-CM | POA: Diagnosis not present

## 2017-02-24 DIAGNOSIS — I739 Peripheral vascular disease, unspecified: Secondary | ICD-10-CM | POA: Diagnosis not present

## 2017-02-24 DIAGNOSIS — B351 Tinea unguium: Secondary | ICD-10-CM | POA: Diagnosis not present

## 2017-03-05 NOTE — Progress Notes (Signed)
Cardiology Office Note   Date:  03/06/2017   ID:  Derrick Moon, DOB 02/02/1941, MRN [id number]  PCP:  [name], MD  Cardiologist:  Bronson Ing Chief Complaint  Patient presents with  . AAA  . Hypertension  . Cardiomyopathy     History of Present Illness: Derrick Moon is a 76 y.o. male who presents for ongoing assessment and management of hypertension, history of abdominal aortic aneurysm, peripheral arterial disease with ABIs revealing bilateral superficial femoral artery occlusions with extensive collaterals in both legs and patent runoff in the right leg.  The patient is also followed by pulmonology for COPD, OSA on CPAP.  He was taken off of lisinopril due to  severe coughing.  He has not had AAA evaluated since Doppler study in 2015.  Most recent CT scan of the chest on 02/02/2017 to rule out PE was negative, however it did reveal coronary atherosclerosis of the left anterior descending right coronary artery.  He was recently discharged in November 2018 after admission for sepsis and respiratory infection, was found to have A. fib RVR during that time.  He underwent cardioversion by Dr. Marlou Porch on 02/06/2007 and has been placed on Eliquis 5 mg twice daily.  He was sent home on antibiotics and steroids.  He was noted to have evidence of hyperthyroidism, with abnormal TSH 0.009.  He is to follow-up with his primary care physician for further evaluation and treatment.  Since that time he is continued to have progressive dyspnea, usually after eating or walking greater than 100 feet.  He is having more fatigue.  He also states he feels pressure substernally after he eats.  He has been seen by pulmonary, and is being treated for COPD with steroid inhalers.[name]  He is to follow-up with pulmonology as needed only now.   Past Medical History:  Diagnosis Date  . AAA (abdominal aortic aneurysm) ([place])   . Anxiety    due to pain in leg  . Arthritis    Osteo  . Bell's palsy   . Cancer [facility])      Bladder cancer  . Cataracts, bilateral   . COPD (chronic obstructive pulmonary disease) ([facility])    Albuterol inhaler as needed  . Diverticulitis   . Enlarged prostate    takes Flomax daily  . GERD (gastroesophageal reflux disease)    was on Prilosec but has been off for yrs-no issues now with reflux  . Headache    migraines years ago  . History of bronchitis    > 49yrs ago  . History of colon polyps    benign  . History of dizziness    several yrs ago-was on Meclizine but has been off for several yrs-no problems since  . History of hiatal hernia   . Hypertension    takes Amlodipine and Lisinpril daily  . Joint pain   . Joint swelling   . Nocturia   . PAD (peripheral artery disease) ([facility])     bilateral superficial femoral artery occlusions   . Pneumonia    several times with most recent within 5 yrs ago  . Restless leg syndrome   . Rupture of left quadriceps tendon   . Shortness of breath dyspnea    with exertion  . Sleep apnea    does not use cpap  . Urinary urgency     Past Surgical History:  Procedure Laterality Date  . BICEPS TENDON REPAIR     LEFT  . CARDIOVERSION N/A 02/06/2017  Procedure: CARDIOVERSION;  Surgeon: Jerline Pain, MD;  Location: [facility] ENDOSCOPY;  Service: Cardiovascular;  Laterality: N/A;  . CARPAL TUNNEL RELEASE Right 2014  . CHOLECYSTECTOMY  July 16,2010  . COLONOSCOPY    . CYSTOSCOPY    . ESOPHAGOGASTRODUODENOSCOPY    . FEMORAL-POPLITEAL BYPASS GRAFT Right 09/19/2015   Procedure: RIGHT COMMON FEMORAL-ABOVE KNEE POPLITEAL ARTERY    BYPASS GRAFT ;  Surgeon: Conrad Monument Hills, MD;  Location: [place];  Service: Vascular;  Laterality: Right;  . FOOT SURGERY Right    x 5t  . HAND SURGERY Right   . HERNIA REPAIR Bilateral    inguinal  . PERIPHERAL VASCULAR CATHETERIZATION N/A 08/02/2015   Procedure: Abdominal Aortogram w/Lower Extremity;  Surgeon: Conrad Crockett, MD;  Location: [place] CV LAB;  Service: Cardiovascular;  Laterality: N/A;  . QUADRICEPS  TENDON REPAIR Left 08/22/2016   Procedure: LEFT OPEN QUADRICEP TENDON REPAIR;  Surgeon: Netta Cedars, MD;  Location: [place];  Service: Orthopedics;  Laterality: Left;  . REVERSE SHOULDER ARTHROPLASTY Right 07/07/2014   Procedure: RIGHT REVERSE TOTAL SHOULDER ;  Surgeon: Netta Cedars, MD;  Location: [place];  Service: Orthopedics;  Laterality: Right;  . REVERSE TOTAL SHOULDER ARTHROPLASTY Right 07/07/2014   Dr Veverly Fells  . ROTATOR CUFF REPAIR     Bilateral   . TEE WITHOUT CARDIOVERSION N/A 02/06/2017   Procedure: TRANSESOPHAGEAL ECHOCARDIOGRAM (TEE);  Surgeon: Jerline Pain, MD;  Location[facility] ENDOSCOPY;  Service: Cardiovascular;  Laterality: N/A;  . TONSILLECTOMY    . tumor removed from bladder    . VEIN HARVEST Right 09/19/2015   Procedure: WITH NON REVERSE RIGHT GREATER SAPHENOUS VEIN HARVEST;  Surgeon: Conrad Lorena, MD;  Location: [place];  Service: Vascular;  Laterality: Right;     Current Outpatient Medications  Medication Sig Dispense Refill  . acetaminophen (TYLENOL) 500 MG tablet Take 1,000 mg every 6 (six) hours as needed by mouth.    [name] albuterol (PROVENTIL HFA;VENTOLIN HFA) 108 (90 BASE) MCG/ACT inhaler Inhale 1-2 puffs into the lungs every 6 (six) hours as needed for wheezing or shortness of breath. Reported on 10/03/2015    . apixaban (ELIQUIS) 5 MG TABS tablet Take 1 tablet (5 mg total) 2 (two) times daily by mouth. 60 tablet 0  . diphenhydrAMINE (BENADRYL) 25 MG tablet Take 25 mg by mouth every 6 (six) hours as needed.    . famotidine (PEPCID) 20 MG tablet One at bedtime 30 tablet 2  . Glycopyrrolate-Formoterol (BEVESPI AEROSPHERE) 9-4.8 MCG/ACT AERO Inhale 2 puffs into the lungs 2 (two) times daily. 1 Inhaler 11  . guaiFENesin (MUCINEX) 600 MG 12 hr tablet Take 1 tablet (600 mg total) 2 (two) times daily by mouth. 14 tablet 0  . irbesartan (AVAPRO) 150 MG tablet Take 1 tablet (150 mg total) by mouth daily. 30 tablet 11  . metoprolol tartrate (LOPRESSOR) 25 MG tablet Take 1 tablet (25 mg  total) 2 (two) times daily by mouth. 60 tablet 0  . OXYGEN 2lpm with sleep and if needed during the daytime    . pantoprazole (PROTONIX) 40 MG tablet Take 1 tablet (40 mg total) by mouth daily. Take 30-60 min before first meal of the day 30 tablet 2  . predniSONE (DELTASONE) 10 MG tablet Take 4 tabs x 3 days, then 3 tabs x 3 days, then 2 tabs x 3days, then 1 tab x 3days 30 tablet 0  . Tamsulosin HCl (FLOMAX) 0.4 MG CAPS Take 0.4 mg by mouth 2 (two) times  daily.      No current facility-administered medications for this visit.     Allergies:   Aspartame and phenylalanine; Other; and Sulfonamide derivatives    Social History:  The patient  reports that he quit smoking about 6 weeks ago. His smoking use included cigars. He quit after 45.00 years of use. he has never used smokeless tobacco. He reports that he drinks alcohol. He reports that he uses drugs. Drug: Marijuana.   Family History:  The patient's family history includes AAA (abdominal aortic aneurysm) in his father; Heart defect in his sister; Heart disease in his sister; Parkinson's disease in his mother.    ROS: All other systems are reviewed and negative. Unless otherwise mentioned in H&P    PHYSICAL EXAM: VS:  BP 126/72   Pulse 69   Ht 5\' 11"  (1.803 m)   Wt 192 lb (87.1 kg)   SpO2 91%   BMI 26.78 kg/m  , BMI Body mass index is 26.78 kg/m. GEN: Well nourished, well developed, in no acute distress  HEENT: normal  Neck: no JVD, carotid bruits, or masses Cardiac: RRR; no murmurs, rubs, or gallops,no edema  Respiratory:  Clear to auscultation bilaterally, normal work of breathing, scant expiratory wheezes cleared with cough. GI: soft, nontender, nondistended, + BS MS: no deformity or atrophy  Skin: warm and dry, no rash Neuro:  Strength and sensation are intact Psych: euthymic mood, full affect   Recent Labs: 02/03/2017: ALT 51 02/05/2017: Magnesium 2.3 02/07/2017: BUN 27; Creatinine, Ser 0.87; Potassium 4.4; Sodium  139; TSH 0.009 02/08/2017: Hemoglobin 13.7; Platelets 495    Lipid Panel    Component Value Date/Time   CHOL  06/29/2008 0200    153        ATP III CLASSIFICATION:  <200     mg/dL   Desirable  200-239  mg/dL   Borderline High  >=240    mg/dL   High          TRIG 123 06/29/2008 0200   HDL 29 (L) 06/29/2008 0200   CHOLHDL 5.3 06/29/2008 0200   VLDL 25 06/29/2008 0200   LDLCALC  06/29/2008 0200    99        Total Cholesterol/HDL:CHD Risk Coronary Heart Disease Risk Table                     Men   Women  1/2 Average Risk   3.4   3.3  Average Risk       5.0   4.4  2 X Average Risk   9.6   7.1  3 X Average Risk  23.4   11.0        Use the calculated Patient Ratio above and the CHD Risk Table to determine the patient's CHD Risk.        ATP III CLASSIFICATION (LDL):  <100     mg/dL   Optimal  100-129  mg/dL   Near or Above                    Optimal  130-159  mg/dL   Borderline  160-189  mg/dL   High  >190     mg/dL   Very High      Wt Readings from Last 3 Encounters:  03/06/17 192 lb (87.1 kg)  02/19/17 189 lb 3.2 oz (85.8 kg)  02/08/17 205 lb 0.4 oz (93 kg)      Other studies Reviewed: Echocardiogram 2017/02/22 Left ventricle:  The cavity size was normal. Wall thickness was   normal. Systolic function was vigorous. The estimated ejection   fraction was in the range of 65% to 70%. Wall motion was normal;   there were no regional wall motion abnormalities. - Left atrium: The atrium was moderately dilated.  NM 5/24//2017  No diagnostic ST segment changes to indicate ischemia.  Small, mild intensity, partially reversible mid apical inferoseptal defect. Possibly associated with variable soft tissue attenuation, however mild region of ischemia is not excluded.  This is a low risk study.  Nuclear stress EF: 66%.  ASSESSMENT AND PLAN:  1.  Worsening dyspnea: He states that his breathing status has worsened the last 3 years but substantially over the last few  months.  He was recently treated as an inpatient for acute respiratory infection and sepsis.  He has finished his course of antibiotics and steroids.  His breathing status is no better subjectively.  He has been released by pulmonology to be seen as needed and continues on albuterol inhalers when necessary.  I am concerned with CT scan revealing RCA and LAD atherosclerosis that this may be his anginal equivalent.  I have spoken with Dr. Bronson Ing, his primary cardiologist, about proceeding with cardiac catheterization for definitive evaluation of coronary anatomy.  After discussion,  it is Dr. Court Joy recommendation that the patient undergo cardiac CT with FFR.  I have discussed this with the patient who is willing to have the procedure completed.  Will order BMET to eluate kidney function.  Most recent labs on 02/07/2017 revealed a creatinine of 0.87.  2.  Paroxysmal atrial fibrillation: Patient has recently had episode of atrial fibrillation during recent hospitalization November 2018.  The patient had a cardioversion by Dr. Marlou Porch on 02/06/2017 was successful in converting him to normal sinus rhythm.  He remains on Eliquis 5 mg twice daily, and metoprolol 25 mg twice daily.  He has had no further complaints of rapid heart rate or palpitations since discharge.  3.  History of abdominal aortic aneurysm: Most recent Doppler aortic ultrasound revealed proximal aorta 2.66 cm, mid aorta 2.74 cm, and distal aorta 2.66 cm.  Abdominal aorta measuring approximately 2.66 x 2.79 in diameter.  May consider repeating this after cardiac CT.  4.  Peripheral Arterial Disease: Status post common femoral artery to above-the-knee popliteal artery bypass with ipsilateral non-reversed greater saphenous vein graft on 09/19/2015, most recent ABIs revealed patent right lower extremity bypass graft with no evidence of restenosis, increased velocities and turbulence noted in the native inflow artery with minimal disease, no  significant change in comparison to last exam on 04/25/2016.  5.  Hypertension: Pressure is stable on irbesartan 150 mg daily and metoprolol.  No changes in medication regimen at this time.  Current medicines are reviewed at length with the patient today.    Labs/ tests ordered today include: Cardiac CT with FFR Phill Myron. [place] Pugh, ANP, AACC   03/06/2017 1:39 PM    [facility] [address], [place], [place] [address] Phone: [phone]; Fax: [phone]

## 2017-03-06 ENCOUNTER — Encounter: Payer: Self-pay | Admitting: Adult Health

## 2017-03-06 ENCOUNTER — Ambulatory Visit: Payer: PPO | Admitting: Adult Health

## 2017-03-06 ENCOUNTER — Other Ambulatory Visit (HOSPITAL_COMMUNITY)
Admission: RE | Admit: 2017-03-06 | Discharge: 2017-03-06 | Disposition: A | Discharge status: Home / Self Care | Payer: PPO | Source: Ambulatory Visit | Attending: Adult Health | Admitting: Adult Health

## 2017-03-06 VITALS — BP 126/72 | HR 69 | Ht 71.0 in | Wt 192.0 lb

## 2017-03-06 DIAGNOSIS — I2510 Atherosclerotic heart disease of native coronary artery without angina pectoris: Secondary | ICD-10-CM

## 2017-03-06 DIAGNOSIS — R0602 Shortness of breath: Secondary | ICD-10-CM

## 2017-03-06 DIAGNOSIS — I480 Paroxysmal atrial fibrillation: Secondary | ICD-10-CM | POA: Diagnosis not present

## 2017-03-06 DIAGNOSIS — Z79899 Other long term (current) drug therapy: Secondary | ICD-10-CM

## 2017-03-06 DIAGNOSIS — Z8679 Personal history of other diseases of the circulatory system: Secondary | ICD-10-CM | POA: Diagnosis not present

## 2017-03-06 DIAGNOSIS — R071 Chest pain on breathing: Secondary | ICD-10-CM

## 2017-03-06 LAB — BASIC METABOLIC PANEL
ANION GAP: 4 — AB (ref 5–15)
BUN: 13 mg/dL (ref 6–20)
CO2: 31 mmol/L (ref 22–32)
Calcium: 9.5 mg/dL (ref 8.9–10.3)
Chloride: 110 mmol/L (ref 101–111)
Creatinine, Ser: 1.14 mg/dL (ref 0.61–1.24)
GFR calc Af Amer: >60 mL/min (ref >60)
Glucose, Bld: 69 mg/dL (ref 65–99)
POTASSIUM: 4.4 mmol/L (ref 3.5–5.1)
SODIUM: 145 mmol/L (ref 135–145)

## 2017-03-06 NOTE — Patient Instructions (Signed)
Medication Instructions:  Your physician recommends that you continue on your current medications as directed. Please refer to the Current Medication list given to you today.   Labwork: Your physician recommends that you return for lab work in: Today    Testing/Procedures: NONE   Follow-Up: Your physician recommends that you schedule a follow-up appointment in: After Test    Any Other Special Instructions Will Be Listed Below (If Applicable).     If you need a refill on your cardiac medications before your next appointment, please call your pharmacy.  Thank you for choosing [facility]!

## 2017-03-13 DIAGNOSIS — R0602 Shortness of breath: Secondary | ICD-10-CM | POA: Diagnosis not present

## 2017-03-13 DIAGNOSIS — J9620 Acute and chronic respiratory failure, unspecified whether with hypoxia or hypercapnia: Secondary | ICD-10-CM | POA: Diagnosis not present

## 2017-03-30 ENCOUNTER — Ambulatory Visit: Payer: PPO | Admitting: Internal Medicine

## 2017-03-30 ENCOUNTER — Encounter: Payer: Self-pay | Admitting: Internal Medicine

## 2017-03-30 VITALS — BP 118/84 | HR 61 | Ht 71.0 in | Wt 194.0 lb

## 2017-03-30 DIAGNOSIS — F17210 Nicotine dependence, cigarettes, uncomplicated: Secondary | ICD-10-CM

## 2017-03-30 DIAGNOSIS — J449 Chronic obstructive pulmonary disease, unspecified: Secondary | ICD-10-CM

## 2017-03-30 DIAGNOSIS — F17200 Nicotine dependence, unspecified, uncomplicated: Secondary | ICD-10-CM

## 2017-03-30 NOTE — Progress Notes (Signed)
Subjective:     Patient ID: Derrick Moon, male   DOB: Aug 05, 1940   MRN: [id number]    Brief patient profile:  51 yowm quit smoking cigs 1990 / still smoking some cigars until 01/23/17  referred to pulmonary clinic 12/12/2015 by Dr   Willey Blade for sob s/p neg cards eval by Dr Dwana Curd (though doen'st look like pt completed the myocardial perfusion study scheduled for 08/15/15) with COPD GOLD IIdx      .History of Present Illness  12/12/2015 1st [facility] office visit/ Derrick Moon   Chief Complaint  Patient presents with  . Pulmonary Consult    Referred by Dr. [name]. Pt c/o dizziness with exertion for the past 6 months. His spouse states after he has been outside working, he comes in SOB.  He has prod cough with clear sputum in the am's.   variably noisy breathing x 6 months seems to bother wife more than husband  MMRC2 = can't walk a nl pace on a flat grade s sob but does fine slow and flat eg grocery shopping  Sleeping fine off cpap but wakes up every am with throat congestion/ uses halls  spiriva not helping / hfa may help/ prednisone does not help rec avapro  150 mg one daily - if light headed when you stand up break it in half  Stop spiriva and lisinopril and the halls for now GERD diet Only use your albuterol as a rescue medication     01/09/2016  f/u ov/Derrick Moon re:  GOLD II, no maint rx/using saba bid most days / still smoking cigars  Chief Complaint  Patient presents with  . Shortness of Breath    Breathing is improved since last OV. Reports slight SOB and chest congestion. Denies  chest tightness, wheezing or coughing.   tends to needVentolin first thing am for am "congestion" Can now walk the dog s sob/ cp = MMRC2 = can't walk a nl pace on a flat grade s sob but does fine slow and flat eg shopping/ walking the dog  rec Plan A = Automatic = Symbicort 160 Take 2 puffs first thing in am and then another 2 puffs about 12 hours later.  Plan B = Backup Only use your albuterol as  a rescue medication     12/12/2016  f/u ov/Derrick Moon re: copd GOLD II/ symb 160 2bid and daily albuterol around 2 pm Chief Complaint  Patient presents with  . Follow-up    Pt states he is here for his annual followup.  He states his breathing is "pretty good".  He states he was seen by his PCP on 12/03/16 and was dxed with lung infection- txed with pred and doxy.  He has some cough with large amounts of clear sputum.  He is using his albuterol inhaler 1 x daily on average.   worse for several months cough/ congestion esp after supper, does ok at hs/noct  But wakes up most  ams with  One tbsp thick mucus while on symbicort rx pred/doxy > 50% improved / still smoking mostly cigars  Much better breathing  indoors vs outdoors but still mmrc = 2  Needing benadryl to sleep due to sense of pnds   Needing sab at least once daily  rec Plan A = Automatic = symbicort 160 x2 puffs followed by spiriva 2 puffs first thing in am then 12 hours later 2 pffs symbicort only Work on inhaler technique:  relax and gently blow all the way out then  take a nice smooth deep breath back in, triggering the inhaler at same time you start breathing in.  Hold for up to 5 seconds if you can. Blow out thru nose. Rinse and gargle with water when done Plan B = Backup Only use your albuterol as a rescue medication Please remember to go to the  x-ray department downstairs in the basement  for your tests - we will call you with the results when they are available. The key is to stop smoking completely before smoking completely stops you!     01/08/2017  f/u ov/Derrick Moon re:  GOLD II copd on just prn saba  Chief Complaint  Patient presents with  . Follow-up    Breathing has improved some.  He stopped Symbicort due to "too much congestion" and since he stopped med he feels better. He is using his albuterol inhaler once daily on average.   took ventolin x one puff x 3.5 hours prior to OV   Sleeps poorly due to shoulder pain / no am cough  /congestion  No better on symb/spiriva so stopped both  - says can't walk across parking lot s stopping due to sob x one year, no better on symb or spiriva or combo of the two but ventolin really helps (note hfa quite poor)  Doe = MMRC3 = can't walk 100 yards even at a slow pace at a flat grade s stopping due to sob   rec Plan A = Automatic = Bevespi Take 2 puffs first thing in am and then another 2 puffs about 12 hours later.   Plan B = Backup Only use your albuterol (Ventolin) as a rescue   Please schedule a follow up office visit in 6 weeks    02/02/2017 acute extended ov/Derrick Moon re:  Chief Complaint  Patient presents with  . Acute Visit    Increased SOB and cough for the past 2 wks. He is coughing up yellow sputum.    onset  01/24/17 sob/cough getting worse while on bevespi and cut down down saba  Started coughing up dark yellow mucus at onset and gradually worse sob to point where sob at rest and unable to lie in bed with severe coughing spasms/ confused with details of care ? On bevespi an spiriva? Plus saba up to every 4 hours  rec Go to er      Admit date: 02/02/2017 Discharge date: 02/08/2017     Recommendations for Outpatient Follow-up:  Patient will be discharged to home with home oxygen.  Patient will need to follow up with primary care provider within one week of discharge, discuss thyroid function testing.  Follow up with Dr. Melvyn Novas, pulmonology. Follow up with Dr. Jacinta Shoe, cardiology, in 1-2 weeks. Patient should continue medications as prescribed.  Patient should follow a heart healthy diet.   Discharge Diagnoses:  Severe sepsis secondary to respiratory infection Acute on chronic respiratory failure with hypoxemia Atrial fibrillation with RVR, new Acute kidney injury Hypertension BPH Tobacco abuse Abnormal TSH/Hyperthyroidism         Filed Weights   02/05/17 0700 02/07/17 0542 02/08/17 0628  Weight: 91.9 kg (202 lb 9.6 oz) 90.9 kg (200 lb 8 oz) 93 kg  (205 lb 0.4 oz)    History of present illness:  on 02/02/2017 by Dr. [name] a 77 y.o.malewith medical history significant ofBell's palsy, bladder cancer, COPD, diverticulitis, BPH, OSA, restless leg syndrome, PAD, hypertension, hiatal hernia, colonic polyps, bronchitis/COPD.  Patient reports approximately 5-6 day history  of increasing work of breathing with intermittent productive cough and subjective fevers.   In  pulmonologists office patient was noted to be significantly tachycardic, with O2 saturations in the mid 80s, and in atrial fibrillation and  was sent emergency room via EMS.  Hospital Course:  Severe sepsis secondary to respiratory infection -Presented with leukocytosis, tachycardia, tachypnea with hypoxemia and hypotension, and AKI -Treatment plan as below -Completed levaquin course  -Blood cultures show no growth to date -sputum culture pending; gram stain shows Few GPC in clusters, rare GPR- normal resp flora  Acute on chronic respiratory failure with hypoxemia -Likely from purulent tracheobronchitis (per Dr. Gustavus Bryant outpatient note) -Seen by pulmonologist on day of admission -was placed onSolu-Medrol, nebulizer treatments, Mucinex, Levaquin, spiriva -Sputum culture as above -respiratory viral panel unremarkable  -patient feels his breathing has improved -will discharge with prednisone taper -oxygen saturations while on room air at rest 92%, room air during ambulation dropped to 87%, however after rest and a few deep breaths, up to 91%  Atrial fibrillation with RVR, new -Heart rate was running between 150s to 170s -Echocardiogram EF60-75%, wall motion was normal, no regional wall motion abnormalities -Cardiology consulted and appreciated -Was started on heparin however transitioned to Eliquis -s/p TEE/DCCV, currently in sinus rhythm -cardiology has signed off, patient will need to follow up with Dr. Jacinta Shoe  Acute kidney  injury -Resolved -Creatinine at baseline 0.99, upon admission 1.47 -Currently creatinine0.87  Hypertension -Continue Avapro  BPH -Continue Flomax  Tobacco abuse -Patient smokes 3-4 cigars per day however last use was 10 days prior to admission -Discussed smoking cessation  Abnormal TSH/Hyperthyroidism -?subclinical -TSH 0.014 (low) -FT4 1.32 (slightly elevated), FT3 2.1 -given acute illness, would repeat thyroid function tests in 4-6 weeks and may need outpatient radioactive scan  Consultants Cardiology  Procedures  Echocardiogram TEE/DCCV      02/19/2017   Post hosp ext f/u ov/Derrick Moon re: transition of care / on 2lpm at hs/ one or two prednisone left in bottle  Chief Complaint  Patient presents with  . Follow-up    Breathing has improved some, but not back to baseline yet. He states "I feel like I'm suffocating" with exertion and he can not lie down flat.   doe = MMRC2 = can't walk a nl pace on a flat grade s sob but does fine slow and flat eg [employer]  Sleeping in recliner on 3lpm maybe 30 degrees x years No need for saba since d/c but sore throat on both spiriva and bevespi  rec Goal is to keep your 02 sats above 90% at all times so continue 02 2lpm at bedtime and as needed during the day but you need to monitor it when you walk/ exert to be sure your saturations are adequate Pantoprazole (protonix) 40 mg   Take  30-60 min before first meal of the day and Pepcid (famotidine)  20 mg one @  bedtime until return to office - this is the best way to tell whether stomach acid is contributing to your problem.   GERD diet     03/30/2017  f/u ov/Derrick Moon re: COPD  GOLD II / 2lpm hs not using during the day/ low tsh not addressed yet  Chief Complaint  Patient presents with  . Follow-up    Breathing has improved, but still not back to his normal baseline. He is coughing with light yellow sputum, wakes him up in the early am sometimes.    cough/congestion worse in am / still  smoking  cigars  Only uses saba a few times a week   No obvious day to day or daytime variability or assoc  mucus plugs or hemoptysis or cp or chest tightness, subjective wheeze or overt sinus or hb symptoms. No unusual exposure hx or h/o childhood pna/ asthma or knowledge of premature birth.  Sleeping ok flat without nocturnal   exacerbation  of respiratory  c/o's or need for noct saba. Also denies any obvious fluctuation of symptoms with weather or environmental changes or other aggravating or alleviating factors except as outlined above   Current Allergies, Complete Past Medical History, Past Surgical History, Family History, and Social History were reviewed in [employer] record.  ROS  The following are not active complaints unless bolded Hoarseness, sore throat, dysphagia, dental problems, itching, sneezing,  nasal congestion or discharge of excess mucus or purulent secretions, ear ache,   fever, chills, sweats, unintended wt loss or wt gain, classically pleuritic or exertional cp,  orthopnea pnd or leg swelling, presyncope, palpitations, abdominal pain, anorexia, nausea, vomiting, diarrhea  or change in bowel habits or change in bladder habits, change in stools or change in urine, dysuria, hematuria,  rash, arthralgias, visual complaints, headache, numbness, weakness or ataxia or problems with walking or coordination,  change in mood/affect or memory.        Current Meds  Medication Sig  . acetaminophen (TYLENOL) 500 MG tablet Take 1,000 mg every 6 (six) hours as needed by mouth.  [name] albuterol (PROVENTIL HFA;VENTOLIN HFA) 108 (90 BASE) MCG/ACT inhaler Inhale 1-2 puffs into the lungs every 6 (six) hours as needed for wheezing or shortness of breath. Reported on 10/03/2015  . apixaban (ELIQUIS) 5 MG TABS tablet Take 1 tablet (5 mg total) 2 (two) times daily by mouth.  . diphenhydrAMINE (BENADRYL) 25 MG tablet Take 25 mg by mouth every 6 (six) hours as needed.  . famotidine  (PEPCID) 20 MG tablet One at bedtime  . Glycopyrrolate-Formoterol (BEVESPI AEROSPHERE) 9-4.8 MCG/ACT AERO Inhale 2 puffs into the lungs 2 (two) times daily.  [name] guaiFENesin (MUCINEX) 600 MG 12 hr tablet Take 1 tablet (600 mg total) 2 (two) times daily by mouth.  . metoprolol tartrate (LOPRESSOR) 25 MG tablet Take 1 tablet (25 mg total) 2 (two) times daily by mouth.  . OXYGEN 2lpm with sleep and if needed during the daytime  . pantoprazole (PROTONIX) 40 MG tablet Take 1 tablet (40 mg total) by mouth daily. Take 30-60 min before first meal of the day  . Tamsulosin HCl (FLOMAX) 0.4 MG CAPS Take 0.4 mg by mouth 2 (two) times daily.                    Objective:   Physical Exam  amb wm nad   03/30/2017          194  02/20/2017     189 02/02/2017      189  12/12/2016        201  01/09/2016      198   12/12/15 198 lb (89.8 kg)  11/09/15 201 lb 12.8 oz (91.5 kg)  10/03/15 199 lb (90.3 kg)      Vital signs reviewed - Note on arrival 02 sats  96% on RA      HEENT: nl dentition, turbinates bilaterally, and oropharynx. Nl external ear canals without cough reflex   NECK :  without JVD/Nodes/TM/ nl carotid upstrokes bilaterally   LUNGS: no acc muscle use,  Nl contour chest  which coarse somewhat junky insp/exp rhonchi bilaterally    CV:  RRR  no s3 or murmur or increase in P2, and no edema   ABD:  soft and nontender with nl inspiratory excursion in the supine position. No bruits or organomegaly appreciated, bowel sounds nl  MS:  Nl gait/ ext warm without deformities, calf tenderness, cyanosis or clubbing No obvious joint restrictions   SKIN: warm and dry with serpignious vericos veins and mild chronic venous stasis changes both lower ext   NEURO:  alert, approp, nl sensorium with  no motor or cerebellar deficits apparent.               Assessment:

## 2017-03-30 NOTE — Patient Instructions (Addendum)
No change in medications for now   Please schedule a follow up office visit in 6 weeks, call sooner if needed with all medications /inhalers/ solutions in hand so we can verify exactly what you are taking. This includes all medications from all doctors and over the counters  - needs cxr on return

## 2017-03-31 ENCOUNTER — Encounter: Payer: Self-pay | Admitting: Adult Health

## 2017-04-01 ENCOUNTER — Encounter: Payer: Self-pay | Admitting: Internal Medicine

## 2017-04-01 NOTE — Assessment & Plan Note (Signed)

## 2017-04-01 NOTE — Assessment & Plan Note (Signed)
-   PFTs  05/27/12   FEV1  2.04(62%) ratio 54  - 12/12/2015 try off spiriva since pt says not working > increased need for saba - Spirometry 01/09/2016  FEV1 1.19 (47%)  Ratio 50 p nothing prior and classic curvature on f/v loop - 01/09/2016  After extensive coaching HFA effectiveness =    75% try symbicort 160 2bid   - 12/12/2016  After extensive coaching devie effectiveness =    75% with smi > add spiriva 2 each am  - Spirometry 01/08/2017  FEV1 1.52 (48%)  Ratio 59    - 01/08/2017 trial of bevespi  - 03/30/2017  After extensive coaching HFA effectiveness =    75%  From a baseline of 25%   symptosm continue to be difficult to control in setting of poor adherence/ use of hfa lama/laba and continued cig exposure so nothing new to offer here other than be sure he returns with all meds in hand using a trust but verify approach to confirm accurate Medication  Reconciliation The principal here is that until we are certain that the  patients are doing what we've asked, it makes no sense to ask them to do more.   Each maintenance medication was reviewed in detail including most importantly the difference between maintenance and as needed and under what circumstances the prns are to be used.  Please see AVS for specific  Instructions which are unique to this visit and I personally typed out  which were reviewed in detail in writing with the patient and a copy provided.

## 2017-04-07 DIAGNOSIS — B351 Tinea unguium: Secondary | ICD-10-CM | POA: Diagnosis not present

## 2017-04-07 DIAGNOSIS — L851 Acquired keratosis [keratoderma] palmaris et plantaris: Secondary | ICD-10-CM | POA: Diagnosis not present

## 2017-04-07 DIAGNOSIS — I480 Paroxysmal atrial fibrillation: Secondary | ICD-10-CM | POA: Diagnosis not present

## 2017-04-07 DIAGNOSIS — E039 Hypothyroidism, unspecified: Secondary | ICD-10-CM | POA: Diagnosis not present

## 2017-04-07 DIAGNOSIS — I739 Peripheral vascular disease, unspecified: Secondary | ICD-10-CM | POA: Diagnosis not present

## 2017-04-07 DIAGNOSIS — J449 Chronic obstructive pulmonary disease, unspecified: Secondary | ICD-10-CM | POA: Diagnosis not present

## 2017-04-13 DIAGNOSIS — R0602 Shortness of breath: Secondary | ICD-10-CM | POA: Diagnosis not present

## 2017-04-13 DIAGNOSIS — J9620 Acute and chronic respiratory failure, unspecified whether with hypoxia or hypercapnia: Secondary | ICD-10-CM | POA: Diagnosis not present

## 2017-04-17 ENCOUNTER — Ambulatory Visit (HOSPITAL_COMMUNITY): Admission: RE | Admit: 2017-04-17 | Payer: PPO | Source: Ambulatory Visit

## 2017-04-17 ENCOUNTER — Ambulatory Visit (HOSPITAL_COMMUNITY)
Admission: RE | Admit: 2017-04-17 | Discharge: 2017-04-17 | Disposition: A | Discharge status: Home / Self Care | Payer: PPO | Source: Ambulatory Visit | Attending: Adult Health | Admitting: Adult Health

## 2017-04-17 DIAGNOSIS — R079 Chest pain, unspecified: Secondary | ICD-10-CM | POA: Diagnosis not present

## 2017-04-17 DIAGNOSIS — R0602 Shortness of breath: Secondary | ICD-10-CM | POA: Diagnosis not present

## 2017-04-17 DIAGNOSIS — R071 Chest pain on breathing: Secondary | ICD-10-CM | POA: Diagnosis not present

## 2017-04-17 DIAGNOSIS — I2510 Atherosclerotic heart disease of native coronary artery without angina pectoris: Secondary | ICD-10-CM | POA: Diagnosis not present

## 2017-04-17 DIAGNOSIS — Z8679 Personal history of other diseases of the circulatory system: Secondary | ICD-10-CM | POA: Diagnosis not present

## 2017-04-17 MED ORDER — METOPROLOL TARTRATE 5 MG/5ML IV SOLN
INTRAVENOUS | Status: AC
  Administered 2017-04-17: 25 mg
  Administered 2017-04-17: 5 mg
  Filled 2017-04-17: qty 5

## 2017-04-17 MED ORDER — IOPAMIDOL (ISOVUE-370) INJECTION 76%
INTRAVENOUS | Status: AC
  Administered 2017-04-17: 80 mL
  Filled 2017-04-17: qty 100

## 2017-04-17 MED ORDER — METOPROLOL TARTRATE 5 MG/5ML IV SOLN
INTRAVENOUS | Status: AC
  Administered 2017-04-17: 5 mg
  Filled 2017-04-17: qty 10

## 2017-04-17 MED ORDER — NITROGLYCERIN 0.4 MG SL SUBL
SUBLINGUAL_TABLET | SUBLINGUAL | Status: AC
  Administered 2017-04-17: 0.8 mg
  Filled 2017-04-17: qty 2

## 2017-04-21 ENCOUNTER — Other Ambulatory Visit: Payer: Self-pay | Admitting: Internal Medicine

## 2017-04-27 DIAGNOSIS — C672 Malignant neoplasm of lateral wall of bladder: Secondary | ICD-10-CM | POA: Diagnosis not present

## 2017-04-27 DIAGNOSIS — N401 Enlarged prostate with lower urinary tract symptoms: Secondary | ICD-10-CM | POA: Diagnosis not present

## 2017-05-05 DIAGNOSIS — M79671 Pain in right foot: Secondary | ICD-10-CM | POA: Diagnosis not present

## 2017-05-07 ENCOUNTER — Encounter: Payer: Self-pay | Admitting: "Endocrinology

## 2017-05-07 ENCOUNTER — Ambulatory Visit: Payer: PPO | Admitting: "Endocrinology

## 2017-05-07 VITALS — BP 155/78 | HR 58 | Ht 71.0 in | Wt 197.0 lb

## 2017-05-07 DIAGNOSIS — E0590 Thyrotoxicosis, unspecified without thyrotoxic crisis or storm: Secondary | ICD-10-CM

## 2017-05-07 DIAGNOSIS — R7989 Other specified abnormal findings of blood chemistry: Secondary | ICD-10-CM | POA: Insufficient documentation

## 2017-05-07 NOTE — Progress Notes (Signed)
Consult Note                                            05/07/2017, 10:22 AM   Subjective:    Patient ID: Derrick Moon, male    DOB: [birth year], PCP [name], MD   Past Medical History:  Diagnosis Date  . AAA (abdominal aortic aneurysm) ([place])   . Anxiety    due to pain in leg  . Arthritis    Osteo  . Bell's palsy   . [facility])    Bladder cancer  . Cataracts, bilateral   . COPD (chronic obstructive pulmonary disease) (H[facility])    Albuterol inhaler as needed  . Diverticulitis   . Enlarged prostate    takes Flomax daily  . GERD (gastroesophageal reflux disease)    was on Prilosec but has been off for yrs-no issues now with reflux  . Headache    migraines years ago  . History of bronchitis    > 39yrs ago  . History of colon polyps    benign  . History of dizziness    several yrs ago-was on Meclizine but has been off for several yrs-no problems since  . History of hiatal hernia   . Hypertension    takes Amlodipine and Lisinpril daily  . Joint pain   . Joint swelling   . Nocturia   . PAD (peripheral artery disease) (HCC)     bilateral superficial femoral artery occlusions   . Pneumonia    several times with most recent within 5 yrs ago  . Restless leg syndrome   . Rupture of left quadriceps tendon   . Shortness of breath dyspnea    with exertion  . Sleep apnea    does not use cpap  . Urinary urgency    Past Surgical History:  Procedure Laterality Date  . BICEPS TENDON REPAIR     LEFT  . CARDIOVERSION N/A 02/06/2017   Procedure: CARDIOVERSION;  Surgeon: Jerline Pain, MD;  Location: [facility] ENDOSCOPY;  Service: Cardiovascular;  Laterality: N/A;  . CARPAL TUNNEL RELEASE Right 2014  . CHOLECYSTECTOMY  July 16,2010  . COLONOSCOPY    . CYSTOSCOPY    . ESOPHAGOGASTRODUODENOSCOPY    . FEMORAL-POPLITEAL BYPASS GRAFT Right 09/19/2015   Procedure: RIGHT COMMON FEMORAL-ABOVE KNEE POPLITEAL ARTERY    BYPASS GRAFT ;  Surgeon: Conrad Youngwood, MD;  Location: [place];   Service: Vascular;  Laterality: Right;  . FOOT SURGERY Right    x 5t  . HAND SURGERY Right   . HERNIA REPAIR Bilateral    inguinal  . PERIPHERAL VASCULAR CATHETERIZATION N/A 08/02/2015   Procedure: Abdominal Aortogram w/Lower Extremity;  Surgeon: Conrad Schoolcraft, MD;  Location: [place] CV LAB;  Service: Cardiovascular;  Laterality: N/A;  . QUADRICEPS TENDON REPAIR Left 08/22/2016   Procedure: LEFT OPEN QUADRICEP TENDON REPAIR;  Surgeon: Netta Cedars, MD;  Location: [place];  Service: Orthopedics;  Laterality: Left;  . REVERSE SHOULDER ARTHROPLASTY Right 07/07/2014   Procedure: RIGHT REVERSE TOTAL SHOULDER ;  Surgeon: Netta Cedars, MD;  Location: [place];  Service: Orthopedics;  Laterality: Right;  . REVERSE TOTAL SHOULDER ARTHROPLASTY Right 07/07/2014   Dr Veverly Fells  . ROTATOR CUFF REPAIR     Bilateral   . TEE WITHOUT CARDIOVERSION N/A 02/06/2017   Procedure: TRANSESOPHAGEAL ECHOCARDIOGRAM (TEE);  Surgeon: Jerline Pain, MD;  Location: [facility] ENDOSCOPY;  Service: Cardiovascular;  Laterality: N/A;  . TONSILLECTOMY    . tumor removed from bladder    . VEIN HARVEST Right 09/19/2015   Procedure: WITH NON REVERSE RIGHT GREATER SAPHENOUS VEIN HARVEST;  Surgeon: Conrad [place], MD;  Location: [place];  Service: Vascular;  Laterality: Right;   Social History   Socioeconomic History  . Marital status: Married    Spouse name: None  . Number of children: None  . Years of education: None  . Highest education level: None  Social Needs  . Financial resource strain: None  . Food insecurity - worry: None  . Food insecurity - inability: None  . Transportation needs - medical: None  . Transportation needs - non-medical: None  Occupational History  . None  Tobacco Use  . Smoking status: Current Every Day Smoker    Years: 45.00    Types: Cigars  . Smokeless tobacco: Never Used  Substance and Sexual Activity  . Alcohol use: Yes    Alcohol/week: 0.0 oz    Comment: wine occasionally  . Drug use: Yes     Types: Marijuana    Comment: rare; last use 2017  . Sexual activity: Yes  Other Topics Concern  . None  Social History Narrative   Pt lives w/ wife in [place], [place]   Outpatient Encounter Medications as of 05/07/2017  Medication Sig  . albuterol (PROVENTIL HFA;VENTOLIN HFA) 108 (90 BASE) MCG/ACT inhaler Inhale 1-2 puffs into the lungs every 6 (six) hours as needed for wheezing or shortness of breath. Reported on 10/03/2015  . apixaban (ELIQUIS) 5 MG TABS tablet Take 1 tablet (5 mg total) 2 (two) times daily by mouth.  . famotidine (PEPCID) 20 MG tablet One at bedtime  . Glycopyrrolate-Formoterol (BEVESPI AEROSPHERE) 9-4.8 MCG/ACT AERO Inhale 2 puffs into the lungs 2 (two) times daily.  . metoprolol tartrate (LOPRESSOR) 25 MG tablet Take 1 tablet (25 mg total) 2 (two) times daily by mouth.  . pantoprazole (PROTONIX) 40 MG tablet TAKE 1 TABLET BY MOUTH 30 TO 60 MINUTES PRIOR TO THE FIRST MEAL OF THE DAY.  . Tamsulosin HCl (FLOMAX) 0.4 MG CAPS Take 0.4 mg by mouth 2 (two) times daily.   [name] acetaminophen (TYLENOL) 500 MG tablet Take 1,000 mg every 6 (six) hours as needed by mouth.  . diphenhydrAMINE (BENADRYL) 25 MG tablet Take 25 mg by mouth every 6 (six) hours as needed.  [name] guaiFENesin (MUCINEX) 600 MG 12 hr tablet Take 1 tablet (600 mg total) 2 (two) times daily by mouth.  . OXYGEN 2lpm with sleep and if needed during the daytime   No facility-administered encounter medications on file as of 05/07/2017.    ALLERGIES: Allergies  Allergen Reactions  . Aspartame And Phenylalanine Nausea Only  . Other Rash    Pt reports rash on back, buttocks, shoulders and stomach after arteriogram  . Sulfonamide Derivatives Nausea And Vomiting    VACCINATION STATUS: Immunization History  Administered Date(s) Administered  . Influenza, High Dose Seasonal PF 12/12/2015, 01/08/2017  . Tdap 08/15/2016    HPI Derrick Moon is 77 y.o. male who presents today with a medical history as above. he is being  seen in consultation for hyperthyroidism requested by [name], MD.  -He denies any prior history of thyroid dysfunction nor any family history of thyroid dysfunction or thyroid cancer. -He was hospitalized in November 2018 with community-acquired pneumonia, when he was found to have suppressed  TSH on 2 occasions in the span of 3 days associated with a slightly elevated free T4. -He was not initiated on any antithyroid medication.  Currently says he said he feels better except for his ongoing breathing problems related to COPD, coronary artery disease.  Although he lost weight prior to his hospitalization, after discharge he noticed he is regaining the weight he lost.   -He denies palpitations, heat intolerance.  He has long-standing history of tremors.   -He denies any exposure to medications such as amiodarone, lithium.   Review of Systems  Constitutional: + weight gain, + fatigue, no subjective hyperthermia, no subjective hypothermia Eyes: no blurry vision, no xerophthalmia ENT: no sore throat, no nodules palpated in throat, no dysphagia/odynophagia, no hoarseness Cardiovascular: no Chest Pain, + Shortness of Breath, no palpitations, no leg swelling Respiratory: + cough, +SOB Gastrointestinal: no Nausea/Vomiting/Diarhhea Musculoskeletal: no muscle/joint aches Skin: no rashes Neurological: + tremors, no numbness, no tingling, no dizziness Psychiatric: no depression, no anxiety  Objective:    BP (!) 155/78   Pulse (!) 58   Ht 5\' 11"  (1.803 m)   Wt 197 lb (89.4 kg)   SpO2 95%   BMI 27.48 kg/m   Wt Readings from Last 3 Encounters:  05/07/17 197 lb (89.4 kg)  03/30/17 194 lb (88 kg)  03/06/17 192 lb (87.1 kg)    Physical Exam  Constitutional: + Overweight for height, not in acute distress, normal state of mind Eyes: PERRLA, EOMI, no exophthalmos ENT: moist mucous membranes, no thyromegaly, no cervical lymphadenopathy Cardiovascular: normal precordial activity,  + distant heart  sounds, + bradycardic at 58, no Murmur/Rubs/Gallops Respiratory:  adequate breathing efforts, + tight chest, + scattered wheezes on all lung fields.  Gastrointestinal: abdomen soft, Non -tender, No distension, Bowel Sounds present Musculoskeletal: no gross deformities, strength intact in all four extremities Skin: moist, warm, no rashes Neurological:  + Gross tremors on outstretched hands, DTR reflexes are normal on bilateral lower extremities.    CMP ( most recent) CMP     Component Value Date/Time   NA 145 03/06/2017 1406   K 4.4 03/06/2017 1406   CL 110 03/06/2017 1406   CO2 31 03/06/2017 1406   GLUCOSE 69 03/06/2017 1406   BUN 13 03/06/2017 1406   CREATININE 1.14 03/06/2017 1406   CALCIUM 9.5 03/06/2017 1406   PROT 6.2 (L) 02/03/2017 0837   ALBUMIN 2.6 (L) 02/03/2017 0837   AST 56 (H) 02/03/2017 0837   ALT 51 02/03/2017 0837   ALKPHOS 106 02/03/2017 0837   BILITOT 0.7 02/03/2017 0837   GFRNONAA >60 03/06/2017 1406   GFRAA >60 03/06/2017 1406     Lipid Panel ( most recent) Lipid Panel     Component Value Date/Time   CHOL  06/29/2008 0200    153        ATP III CLASSIFICATION:  <200     mg/dL   Desirable  200-239  mg/dL   Borderline High  >=240    mg/dL   High          TRIG 123 06/29/2008 0200   HDL 29 (L) 06/29/2008 0200   CHOLHDL 5.3 06/29/2008 0200   VLDL 25 06/29/2008 0200   LDLCALC  06/29/2008 0200    99        Total Cholesterol/HDL:CHD Risk Coronary Heart Disease Risk Table                     Men   Women  1/2 Average  Risk   3.4   3.3  Average Risk       5.0   4.4  2 X Average Risk   9.6   7.1  3 X Average Risk  23.4   11.0        Use the calculated Patient Ratio above and the CHD Risk Table to determine the patient's CHD Risk.        ATP III CLASSIFICATION (LDL):  <100     mg/dL   Optimal  100-129  mg/dL   Near or Above                    Optimal  130-159  mg/dL   Borderline  160-189  mg/dL   High  >190     mg/dL   Very High     Results for  Derrick Moon, Derrick Moon (MRN [id number]) as of 05/07/2017 13:41  Ref. Range 02/05/2017 07:59 02/06/2017 10:49 02/07/2017 12:41  TSH Latest Ref Range: 0.350 - 4.500 uIU/mL 0.014 (L)  0.009 (L)  Triiodothyronine,Free,Serum Latest Ref Range: 2.0 - 4.4 pg/mL  2.1   T4,Free(Direct) Latest Ref Range: 0.61 - 1.12 ng/dL  1.32 (H)       Assessment & Plan:   1. Thyrotoxicosis , unspecified thyrotoxicosis type - Derrick Moon  is being seen at a kind request of [name], MD. - I have reviewed his available thyroid records and clinically evaluated the patient. -He looks clinically euthyroid, however during an acute illness , in  a hospital setting  in November 2018 he was found to have suppressed TSH on 2 occasions associated with slightly elevated free T4 suggestive of thyrotoxicosis. -He needs more confirmatory complete thyroid function tests on ambulatory basis as an outpatient. -I will send him for repeat TSH, free T4/free T3, and antithyroid antibodies. -He will return in 1 week to review these labs and if he is found to have hyperthyroidism, he will be considered for thyroid uptake and scan to confirm etiology. -If he is confirmed to have primary hyperparathyroidism, he would be considered for definitive therapy with radioactive iodine given his history of cardiac dysrhythmias, coronary artery disease. -He does not have clinical goiter, no imaging is ordered today. - I did not initiate any new prescriptions today.  -I have counseled him for smoking cessation given already established diagnosis of COPD currently complaining of shortness of breath.  He has a 75+-pack-year history of smoking.  - I advised patient to maintain close follow up with [name], MD for primary care needs.  Follow up plan: Return in about 1 week (around 05/14/2017) for labs today.   Glade Lloyd, MD [facility] [address] [facility] [address] [place], [place] [address] Phone: [phone]   Fax: [phone]     05/07/2017, 10:22 AM  This note was partially dictated with voice recognition software. Similar sounding words can be transcribed inadequately or may not  be corrected upon review.

## 2017-05-08 LAB — THYROID PEROXIDASE ANTIBODY: THYROID PEROXIDASE ANTIBODY: 1 [IU]/mL (ref <9)

## 2017-05-08 LAB — THYROGLOBULIN ANTIBODY

## 2017-05-08 LAB — TSH: TSH: 0.41 mIU/L (ref 0.40–4.50)

## 2017-05-08 LAB — T3, FREE: T3 FREE: 3.6 pg/mL (ref 2.3–4.2)

## 2017-05-08 LAB — T4, FREE: FREE T4: 1.1 ng/dL (ref 0.8–1.8)

## 2017-05-11 ENCOUNTER — Ambulatory Visit: Payer: PPO | Admitting: Internal Medicine

## 2017-05-11 ENCOUNTER — Encounter: Payer: Self-pay | Admitting: Internal Medicine

## 2017-05-11 VITALS — BP 134/80 | HR 84 | Ht 71.0 in | Wt 198.0 lb

## 2017-05-11 DIAGNOSIS — J449 Chronic obstructive pulmonary disease, unspecified: Secondary | ICD-10-CM | POA: Diagnosis not present

## 2017-05-11 DIAGNOSIS — J9601 Acute respiratory failure with hypoxia: Secondary | ICD-10-CM | POA: Diagnosis not present

## 2017-05-11 DIAGNOSIS — F17210 Nicotine dependence, cigarettes, uncomplicated: Secondary | ICD-10-CM | POA: Diagnosis not present

## 2017-05-11 MED ORDER — BUDESONIDE-FORMOTEROL FUMARATE 160-4.5 MCG/ACT IN AERO: 2.0000 | INHALATION_SPRAY | Freq: Two times a day (BID) | RESPIRATORY_TRACT | 0 refills | Status: DC

## 2017-05-11 MED ORDER — PANTOPRAZOLE SODIUM 40 MG PO TBEC: 40.0000 mg | DELAYED_RELEASE_TABLET | Freq: Every day | ORAL | 11 refills | Status: DC

## 2017-05-11 MED ORDER — FAMOTIDINE 20 MG PO TABS: ORAL_TABLET | ORAL | 11 refills | Status: DC

## 2017-05-11 MED ORDER — BUDESONIDE-FORMOTEROL FUMARATE 160-4.5 MCG/ACT IN AERO: 2.0000 | INHALATION_SPRAY | Freq: Two times a day (BID) | RESPIRATORY_TRACT | 11 refills | Status: DC

## 2017-05-11 NOTE — Patient Instructions (Addendum)
Ok to stop 02   Plan A = Automatic = change bevespi to symbicort 160  Take 2 puffs first thing in am and then another 2 puffs about 12 hours later.   Work on inhaler technique:  relax and gently blow all the way out then take a nice smooth deep breath back in, triggering the inhaler at same time you start breathing in.  Hold for up to 5 seconds if you can. Blow out thru nose. Rinse and gargle with water when done      Plan B = Backup Only use your albuterol as a rescue medication to be used if you can't catch your breath by resting or doing a relaxed purse lip breathing pattern.  - The less you use it, the better it will work when you need it. - Ok to use the inhaler up to 2 puffs  every 4 hours if you must but call for appointment if use goes up over your usual need - Don't leave home without it !!  (think of it like the spare tire for your car)    Please schedule a follow up visit in 3 months but call sooner if needed  with all medications /inhalers/ solutions in hand so we can verify exactly what you are taking. This includes all medications from all doctors and over the counters

## 2017-05-11 NOTE — Progress Notes (Signed)
Subjective:     Patient ID: Derrick Moon, male   DOB: 09-02-1940   MRN: [id number]    Brief patient profile:  32 yowm quit smoking cigs 1990 / still smoking some cigars until 01/23/17  referred to pulmonary clinic 12/12/2015 by Dr   Willey Blade for sob s/p neg cards eval by Dr Dwana Curd (though doen'st look like pt completed the myocardial perfusion study scheduled for 08/15/15) with COPD GOLD IIdx      .History of Present Illness  12/12/2015 1st [facility] office visit/ Derrick Moon   Chief Complaint  Patient presents with  . Pulmonary Consult    Referred by Dr. [name]. Pt c/o dizziness with exertion for the past 6 months. His spouse states after he has been outside working, he comes in SOB.  He has prod cough with clear sputum in the am's.   variably noisy breathing x 6 months seems to bother wife more than husband  MMRC2 = can't walk a nl pace on a flat grade s sob but does fine slow and flat eg grocery shopping  Sleeping fine off cpap but wakes up every am with throat congestion/ uses halls  spiriva not helping / hfa may help/ prednisone does not help rec avapro  150 mg one daily - if light headed when you stand up break it in half  Stop spiriva and lisinopril and the halls for now GERD diet Only use your albuterol as a rescue medication      12/12/2016  f/u ov/Derrick Moon re: copd GOLD II/ symb 160 2bid and daily albuterol around 2 pm Chief Complaint  Patient presents with  . Follow-up    Pt states he is here for his annual followup.  He states his breathing is "pretty good".  He states he was seen by his PCP on 12/03/16 and was dxed with lung infection- txed with pred and doxy.  He has some cough with large amounts of clear sputum.  He is using his albuterol inhaler 1 x daily on average.   worse for several months cough/ congestion esp after supper, does ok at hs/noct  But wakes up most  ams with  One tbsp thick mucus while on symbicort rx pred/doxy > 50% improved / still smoking mostly  cigars  Much better breathing  indoors vs outdoors but still mmrc = 2  Needing benadryl to sleep due to sense of pnds   Needing sab at least once daily  rec Plan A = Automatic = symbicort 160 x2 puffs followed by spiriva 2 puffs first thing in am then 12 hours later 2 pffs symbicort only Work on inhaler technique:    Plan B = Backup Only use your albuterol as a rescue medication Please remember to go to the  x-ray department downstairs in the basement  for your tests - we will call you with the results when they are available. The key is to stop smoking completely before smoking completely stops you!            Admit date: 02/02/2017 Discharge date: 02/08/2017   Discharge Diagnoses:  Severe sepsis secondary to respiratory infection Acute on chronic respiratory failure with hypoxemia Atrial fibrillation with RVR, new Acute kidney injury Hypertension BPH Tobacco abuse Abnormal TSH/Hyperthyroidism          02/19/2017   Post hosp ext f/u ov/Derrick Moon re: transition of care / on 2lpm at hs/ one or two prednisone left in bottle  Chief Complaint  Patient presents with  . Follow-up  Breathing has improved some, but not back to baseline yet. He states "I feel like I'm suffocating" with exertion and he can not lie down flat.   doe = MMRC2 = can't walk a nl pace on a flat grade s sob but does fine slow and flat eg [employer]  Sleeping in recliner on 3lpm maybe 30 degrees x years No need for saba since d/c but sore throat on both spiriva and bevespi  rec Goal is to keep your 02 sats above 90% at all times so continue 02 2lpm at bedtime and as needed during the day but you need to monitor it when you walk/ exert to be sure your saturations are adequate Pantoprazole (protonix) 40 mg   Take  30-60 min before first meal of the day and Pepcid (famotidine)  20 mg one @  bedtime until return to office - this is the best way to tell whether stomach acid is contributing to your problem.    GERD diet     03/30/2017  f/u ov/Derrick Moon re: COPD  GOLD II / 2lpm hs not using during the day/ low tsh not addressed yet  Chief Complaint  Patient presents with  . Follow-up    Breathing has improved, but still not back to his normal baseline. He is coughing with light yellow sputum, wakes him up in the early am sometimes.    cough/congestion worse in am / still smoking cigars  Only uses saba a few times a week  rec No change in medications for now  Please schedule a follow up office visit in 6 weeks, call sooner if needed with all medications /inhalers/ solutions in hand so we can verify exactly what you are taking. This includes all medications from all doctors and over the counters  - needs cxr on return     05/11/2017  f/u ov/Derrick Moon re:  COPD II / still smoking cigars  No meds in hand as req  Chief Complaint  Patient presents with  . Follow-up    Breathing is about the same.  He is coughing with clear sputum.  He is using his albuterol inhaler 2-3 x per wk on average. He stopped using his o2 approx 1 wk ago.   stopped 02 2 weeks prior to OV   Dyspnea:  Able to do yardwork slow pace = MMRC2 = can't walk a nl pace on a flat grade s sob but does fine slow and flat  Cough: sometimes p supper min mucoid Sleep: ok     No obvious day to day or daytime variability or assoc excess/ purulent sputum or mucus plugs or hemoptysis or cp or chest tightness, subjective wheeze or overt sinus or hb symptoms. No unusual exposure hx or h/o childhood pna/ asthma or knowledge of premature birth.  Sleeping ok flat without nocturnal  or early am exacerbation  of respiratory  c/o's or need for noct saba. Also denies any obvious fluctuation of symptoms with weather or environmental changes or other aggravating or alleviating factors except as outlined above   Current Allergies, Complete Past Medical History, Past Surgical History, Family History, and Social History were reviewed in [employer] record.  ROS  The following are not active complaints unless bolded Hoarseness, sore throat, dysphagia, dental problems, itching, sneezing,  nasal congestion or discharge of excess mucus or purulent secretions, ear ache,   fever, chills, sweats, unintended wt loss or wt gain, classically pleuritic or exertional cp,  orthopnea pnd or leg  swelling, presyncope, palpitations, abdominal pain, anorexia, nausea, vomiting, diarrhea  or change in bowel habits or change in bladder habits, change in stools or change in urine, dysuria, hematuria,  rash, arthralgias, visual complaints, headache, numbness, weakness or ataxia or problems with walking or coordination,  change in mood/affect or memory.        Current Meds  Medication Sig  . acetaminophen (TYLENOL) 500 MG tablet Take 1,000 mg every 6 (six) hours as needed by mouth.  [name] albuterol (PROVENTIL HFA;VENTOLIN HFA) 108 (90 BASE) MCG/ACT inhaler Inhale 1-2 puffs into the lungs every 6 (six) hours as needed for wheezing or shortness of breath. Reported on 10/03/2015  . apixaban (ELIQUIS) 5 MG TABS tablet Take 1 tablet (5 mg total) 2 (two) times daily by mouth.  . diphenhydrAMINE (BENADRYL) 25 MG tablet Take 25 mg by mouth every 6 (six) hours as needed.  . famotidine (PEPCID) 20 MG tablet One at bedtime  . guaiFENesin (MUCINEX) 600 MG 12 hr tablet Take 1 tablet (600 mg total) 2 (two) times daily by mouth.  . metoprolol tartrate (LOPRESSOR) 25 MG tablet Take 1 tablet (25 mg total) 2 (two) times daily by mouth.  . OXYGEN 2lpm with sleep and if needed during the daytime  . pantoprazole (PROTONIX) 40 MG tablet Take 1 tablet (40 mg total) by mouth daily.  . Tamsulosin HCl (FLOMAX) 0.4 MG CAPS Take 0.4 mg by mouth 2 (two) times daily.   .     . [  Glycopyrrolate-Formoterol (BEVESPI AEROSPHERE) 9-4.8 MCG/ACT AERO Inhale 2 puffs into the lungs 2 (two) times daily.  .                    Objective:   Physical Exam  amb wm nad   05/11/2017         198  03/30/2017          194  02/20/2017     189 02/02/2017      189  12/12/2016        201  01/09/2016      198   12/12/15 198 lb (89.8 kg)  11/09/15 201 lb 12.8 oz (91.5 kg)  10/03/15 199 lb (90.3 kg)     Vital signs reviewed - Note on arrival 02 sats  94% on  RA     HEENT: nl dentition, turbinates bilaterally, and oropharynx. Nl external ear canals without cough reflex   NECK :  without JVD/Nodes/TM/ nl carotid upstrokes bilaterally   LUNGS: no acc muscle use,  Nl contour chest with minimal insp / exp rhonchi bilaterally   CV:  RRR  no s3 or murmur or increase in P2, and no edema   ABD:  soft and nontender with nl inspiratory excursion in the supine position. No bruits or organomegaly appreciated, bowel sounds nl  MS:  Nl gait/ ext warm without deformities, calf tenderness, cyanosis or clubbing No obvious joint restrictions   SKIN: warm and dry without lesions other than chronic venous changes both legs     NEURO:  alert, approp, nl sensorium with  no motor or cerebellar deficits apparent.       I personally reviewed images and agree with radiology impression as follows:   Chest CTa coronary   04/17/17 No acute or significant extracardiac abnormality.         Assessment:

## 2017-05-13 ENCOUNTER — Encounter: Payer: Self-pay | Admitting: Internal Medicine

## 2017-05-13 ENCOUNTER — Encounter: Payer: Self-pay | Admitting: "Endocrinology

## 2017-05-13 ENCOUNTER — Ambulatory Visit: Payer: PPO | Admitting: "Endocrinology

## 2017-05-13 VITALS — BP 134/72 | HR 78 | Ht 71.0 in | Wt 198.0 lb

## 2017-05-13 DIAGNOSIS — R7989 Other specified abnormal findings of blood chemistry: Secondary | ICD-10-CM | POA: Diagnosis not present

## 2017-05-13 NOTE — Assessment & Plan Note (Signed)
>   3 min Discussed the risks and costs (both direct and indirect)  of smoking relative to the benefits of quitting but patient unwilling to commit at this point to a specific quit date.       

## 2017-05-13 NOTE — Assessment & Plan Note (Signed)
05/11/2017  Walked RA x 3 laps @ 185 ft each stopped due to  End of study, nl pace, no sob or desat    - d/c 02 05/11/2017

## 2017-05-13 NOTE — Progress Notes (Signed)
Consult Note                                            05/13/2017, 1:37 PM   Subjective:    Patient ID: Derrick Moon, male    DOB: Sep 22, 1940, PCP [name], MD   Past Medical History:  Diagnosis Date  . AAA (abdominal aortic aneurysm) ([place])   . Anxiety    due to pain in leg  . Arthritis    Osteo  . Bell's palsy   . Cancer [facility])    Bladder cancer  . Cataracts, bilateral   . COPD (chronic obstructive pulmonary disease) ([facility])    Albuterol inhaler as needed  . Diverticulitis   . Enlarged prostate    takes Flomax daily  . GERD (gastroesophageal reflux disease)    was on Prilosec but has been off for yrs-no issues now with reflux  . Headache    migraines years ago  . History of bronchitis    > 35yrs ago  . History of colon polyps    benign  . History of dizziness    several yrs ago-was on Meclizine but has been off for several yrs-no problems since  . History of hiatal hernia   . Hypertension    takes Amlodipine and Lisinpril daily  . Joint pain   . Joint swelling   . Nocturia   . PAD (peripheral artery disease) (HCC)     bilateral superficial femoral artery occlusions   . Pneumonia    several times with most recent within 5 yrs ago  . Restless leg syndrome   . Rupture of left quadriceps tendon   . Shortness of breath dyspnea    with exertion  . Sleep apnea    does not use cpap  . Urinary urgency    Past Surgical History:  Procedure Laterality Date  . BICEPS TENDON REPAIR     LEFT  . CARDIOVERSION N/A 02/06/2017   Procedure: CARDIOVERSION;  Surgeon: Jerline Pain, MD;  Location: [facility] END[facility];  Service: Cardiovascular;  Laterality: N/A;  . CARPAL TUNNEL RELEASE Right 2014  . CHOLECYSTECTOMY  July 16,2010  . COLONOSCOPY    . CYSTOSCOPY    . ESOPHAGOGASTRODUODENOSCOPY    . FEMORAL-POPLITEAL BYPASS GRAFT Right 09/19/2015   Procedure: RIGHT COMMON FEMORAL-ABOVE KNEE POPLITEAL ARTERY    BYPASS GRAFT ;  Surgeon: Conrad Hightsville, MD;  Location: [place];   Service: Vascular;  Laterality: Right;  . FOOT SURGERY Right    x 5t  . HAND SURGERY Right   . HERNIA REPAIR Bilateral    inguinal  . PERIPHERAL VASCULAR CATHETERIZATION N/A 08/02/2015   Procedure: Abdominal Aortogram w/Lower Extremity;  Surgeon: Conrad Watonwan, MD;  Location: [facility];  Service: Cardiovascular;  Laterality: N/A;  . QUADRICEPS TENDON REPAIR Left 08/22/2016   Procedure: LEFT OPEN QUADRICEP TENDON REPAIR;  Surgeon: Netta Cedars, MD;  Location: [place];  Service: Orthopedics;  Laterality: Left;  . REVERSE SHOULDER ARTHROPLASTY Right 07/07/2014   Procedure: RIGHT REVERSE TOTAL SHOULDER ;  Surgeon: Netta Cedars, MD;  Location: [place] [place];  Service: Orthopedics;  Laterality: Right;  . REVERSE TOTAL SHOULDER ARTHROPLASTY Right 07/07/2014   Dr Veverly Fells  . ROTATOR CUFF REPAIR     Bilateral   . TEE WITHOUT CARDIOVERSION N/A 02/06/2017   Procedure: TRANSESOPHAGEAL ECHOCARDIOGRAM (TEE);  Surgeon: Jerline Pain, MD;  Location: [facility] ENDOSCOPY;  Service: Cardiovascular;  Laterality: N/A;  . TONSILLECTOMY    . tumor removed from bladder    . VEIN HARVEST Right 09/19/2015   Procedure: WITH NON REVERSE RIGHT GREATER SAPHENOUS VEIN HARVEST;  Surgeon: Conrad Tyrone, MD;  Location: [place];  Service: Vascular;  Laterality: Right;   Social History   Socioeconomic History  . Marital status: Married    Spouse name: None  . Number of children: None  . Years of education: None  . Highest education level: None  Social Needs  . Financial resource strain: None  . Food insecurity - worry: None  . Food insecurity - inability: None  . Transportation needs - medical: None  . Transportation needs - non-medical: None  Occupational History  . None  Tobacco Use  . Smoking status: Current Every Day Smoker    Years: 45.00    Types: Cigars  . Smokeless tobacco: Never Used  Substance and Sexual Activity  . Alcohol use: Yes    Alcohol/week: 0.0 oz    Comment: wine occasionally  . Drug use: Yes     Types: Marijuana    Comment: rare; last use 2017  . Sexual activity: Yes  Other Topics Concern  . None  Social History Narrative   Pt lives w/ wife in [place], [place]   Outpatient Encounter Medications as of 05/13/2017  Medication Sig  . acetaminophen (TYLENOL) 500 MG tablet Take 1,000 mg every 6 (six) hours as needed by mouth.  [name] albuterol (PROVENTIL HFA;VENTOLIN HFA) 108 (90 BASE) MCG/ACT inhaler Inhale 1-2 puffs into the lungs every 6 (six) hours as needed for wheezing or shortness of breath. Reported on 10/03/2015  . apixaban (ELIQUIS) 5 MG TABS tablet Take 1 tablet (5 mg total) 2 (two) times daily by mouth.  . budesonide-formoterol (SYMBICORT) 160-4.5 MCG/ACT inhaler Inhale 2 puffs into the lungs 2 (two) times daily.  . diphenhydrAMINE (BENADRYL) 25 MG tablet Take 25 mg by mouth every 6 (six) hours as needed.  . famotidine (PEPCID) 20 MG tablet One at bedtime  . guaiFENesin (MUCINEX) 600 MG 12 hr tablet Take 1 tablet (600 mg total) 2 (two) times daily by mouth.  . metoprolol tartrate (LOPRESSOR) 25 MG tablet Take 1 tablet (25 mg total) 2 (two) times daily by mouth.  . OXYGEN 2lpm with sleep and if needed during the daytime  . pantoprazole (PROTONIX) 40 MG tablet Take 1 tablet (40 mg total) by mouth daily.  . Tamsulosin HCl (FLOMAX) 0.4 MG CAPS Take 0.4 mg by mouth 2 (two) times daily.    No facility-administered encounter medications on file as of 05/13/2017.    ALLERGIES: Allergies  Allergen Reactions  . Aspartame And Phenylalanine Nausea Only  . Other Rash    Pt reports rash on back, buttocks, shoulders and stomach after arteriogram  . Sulfonamide Derivatives Nausea And Vomiting    VACCINATION STATUS: Immunization History  Administered Date(s) Administered  . Influenza, High Dose Seasonal PF 12/12/2015, 01/08/2017  . Tdap 08/15/2016    HPI Derrick Moon is 77 y.o. male who presents today with a medical history as above. he is here to follow-up for abnormal thyroid  function test with a repeat full profile thyroid function tests.  -He denies any prior history of thyroid dysfunction nor any family history of thyroid dysfunction or thyroid cancer. -He was hospitalized in November 2018 with community-acquired pneumonia, when he was found to have suppressed TSH on 2 occasions  in the span of 3 days associated with a slightly elevated free T4. -He was not initiated on any antithyroid medication.  Currently says he said he feels better except for his ongoing breathing problems related to COPD, coronary artery disease.  Although he lost weight prior to his hospitalization, after discharge he noticed he is regaining the weight he lost.   -He denies palpitations, heat intolerance.  He has long-standing history of tremors.   -He denies any exposure to medications such as amiodarone, lithium. -His repeat labs show normal thyroid function test.   Review of Systems  Constitutional: + steady weight , + fatigue, no subjective hyperthermia, no subjective hypothermia Eyes: no blurry vision, no xerophthalmia ENT: no sore throat, no nodules palpated in throat, no dysphagia/odynophagia, no hoarseness Cardiovascular: no Chest Pain, + Shortness of Breath, no palpitations, no leg swelling Respiratory: + cough, +SOB Gastrointestinal: no Nausea/Vomiting/Diarhhea Musculoskeletal: no muscle/joint aches Skin: no rashes Neurological: + tremors, no numbness, no tingling, no dizziness Psychiatric: no depression, no anxiety  Objective:    BP 134/72   Pulse 78   Ht 5\' 11"  (1.803 m)   Wt 198 lb (89.8 kg)   BMI 27.62 kg/m   Wt Readings from Last 3 Encounters:  05/13/17 198 lb (89.8 kg)  05/11/17 198 lb (89.8 kg)  05/07/17 197 lb (89.4 kg)    Physical Exam  Constitutional: + Overweight for height, not in acute distress, normal state of mind Eyes: PERRLA, EOMI, no exophthalmos ENT: moist mucous membranes, no thyromegaly, no cervical lymphadenopathy Cardiovascular: normal  precordial activity,  + distant heart sounds, + RRR, no Murmur/Rubs/Gallops Respiratory:  adequate breathing efforts, + tight chest, + scattered wheezes on all lung fields.  Gastrointestinal: abdomen soft, Non -tender, No distension, Bowel Sounds present Musculoskeletal: no gross deformities, strength intact in all four extremities Skin: moist, warm, no rashes Neurological:  + Gross tremors on outstretched hands, DTR reflexes are normal on bilateral lower extremities.    CMP ( most recent) CMP     Component Value Date/Time   NA 145 03/06/2017 1406   K 4.4 03/06/2017 1406   CL 110 03/06/2017 1406   CO2 31 03/06/2017 1406   GLUCOSE 69 03/06/2017 1406   BUN 13 03/06/2017 1406   CREATININE 1.14 03/06/2017 1406   CALCIUM 9.5 03/06/2017 1406   PROT 6.2 (L) 02/03/2017 0837   ALBUMIN 2.6 (L) 02/03/2017 0837   AST 56 (H) 02/03/2017 0837   ALT 51 02/03/2017 0837   ALKPHOS 106 02/03/2017 0837   BILITOT 0.7 02/03/2017 0837   GFRNONAA >60 03/06/2017 1406   GFRAA >60 03/06/2017 1406     Lipid Panel ( most recent) Lipid Panel     Component Value Date/Time   CHOL  06/29/2008 0200    153        ATP III CLASSIFICATION:  <200     mg/dL   Desirable  200-239  mg/dL   Borderline High  >=240    mg/dL   High          TRIG 123 06/29/2008 0200   HDL 29 (L) 06/29/2008 0200   CHOLHDL 5.3 06/29/2008 0200   VLDL 25 06/29/2008 0200   LDLCALC  06/29/2008 0200    99        Total Cholesterol/HDL:CHD Risk Coronary Heart Disease Risk Table                     Men   Women  1/2 Average Risk  3.4   3.3  Average Risk       5.0   4.4  2 X Average Risk   9.6   7.1  3 X Average Risk  23.4   11.0        Use the calculated Patient Ratio above and the CHD Risk Table to determine the patient's CHD Risk.        ATP III CLASSIFICATION (LDL):  <100     mg/dL   Optimal  100-129  mg/dL   Near or Above                    Optimal  130-159  mg/dL   Borderline  160-189  mg/dL   High  >190     mg/dL   Very  High     Results for MARSTON, MCCADDEN (MRN [id number]) as of 05/13/2017 13:39  Ref. Range 05/07/2017 10:13  TSH Latest Ref Range: 0.40 - 4.50 mIU/L 0.41  Triiodothyronine,Free,Serum Latest Ref Range: 2.3 - 4.2 pg/mL 3.6  T4,Free(Direct) Latest Ref Range: 0.8 - 1.8 ng/dL 1.1  Thyroglobulin Ab Latest Ref Range: < or = 1 IU/mL <1  Thyroperoxidase Ab SerPl-aCnc Latest Ref Range: <9 IU/mL 1      Assessment & Plan:   1.  Abnormal thyroid function tests -resolved  -His repeat full profile thyroid function test completely normal, and no antithyroid antibodies detected.  And he is clinically and biochemically euthyroid. -His symptoms including ongoing fatigue are likely due to his COPD. -He will not require thyroid intervention at this time. -I have counseled him for smoking cessation given already established diagnosis of COPD currently complaining of shortness of breath.  He has a 75+-pack-year history of smoking. -I have advised him to continue follow-up with his cardiologist, pulmonologist. -He does not have clinical goiter, hence no need for thyroid imaging at this time. - I advised patient to maintain close follow up with [name], MD for primary care needs. -  Follow up plan: Return in about 1 year (around 05/13/2018), or if symptoms worsen or fail to improve.   Glade Lloyd, MD [facility] [address] [place], [place] [address] Phone: [phone]  Fax: [phone]     05/13/2017, 1:37 PM  This note was partially dictated with voice recognition software. Similar sounding words can be transcribed inadequately or may not  be corrected upon review.

## 2017-05-13 NOTE — Assessment & Plan Note (Signed)
-   PFTs  05/27/12   FEV1  2.04(62%) ratio 54  - 12/12/2015 try off spiriva since pt says not working > increased need for saba - Spirometry 01/09/2016  FEV1 1.19 (47%)  Ratio 50 p nothing prior and classic curvature on f/v loop - 01/09/2016  After extensive coaching HFA effectiveness =    75% try symbicort 160 2bid   - 12/12/2016  After extensive coaching devie effectiveness =    75% with smi > add spiriva 2 each am  - Spirometry 01/08/2017  FEV1 1.52 (48%)  Ratio 59    - 01/08/2017 trial of bevespi    - 05/11/2017  After extensive coaching inhaler device  effectiveness =    90% baseline 25% > pt req change back to symb form bevespi   Despite active smoking doing much better and no longer using 02 or as limited from desired activities on bevespi   Advised if loses ground with ex tol can add spiriva to symbicort as this would suggest Pt is Group B in terms of symptom/risk and laba/lama therefore would be appropriate rx at this point.   Formulary restrictions will be an ongoing challenge for the forseable future and I would be happy to pick an alternative if the pt will first  provide me a list of them but pt  will need to return here for training for any new device that is required eg dpi vs hfa vs respimat.    In meantime we can always provide samples so the patient never runs out of any needed respiratory medications.   I had an extended discussion with the patient reviewing all relevant studies completed to date and  lasting 15 to 20 minutes of a 25 minute visit    Each maintenance medication was reviewed in detail including most importantly the difference between maintenance and prns and under what circumstances the prns are to be triggered using an action plan format that is not reflected in the computer generated alphabetically organized AVS.    Please see AVS for specific instructions unique to this visit that I personally wrote and verbalized to the the pt in detail and then reviewed with pt   by my nurse highlighting any  changes in therapy recommended at today's visit to their plan of care.

## 2017-05-14 ENCOUNTER — Ambulatory Visit: Payer: PPO | Admitting: "Endocrinology

## 2017-05-22 DIAGNOSIS — H01024 Squamous blepharitis left upper eyelid: Secondary | ICD-10-CM | POA: Diagnosis not present

## 2017-05-22 DIAGNOSIS — H01021 Squamous blepharitis right upper eyelid: Secondary | ICD-10-CM | POA: Diagnosis not present

## 2017-05-22 DIAGNOSIS — H01025 Squamous blepharitis left lower eyelid: Secondary | ICD-10-CM | POA: Diagnosis not present

## 2017-05-22 DIAGNOSIS — H01022 Squamous blepharitis right lower eyelid: Secondary | ICD-10-CM | POA: Diagnosis not present

## 2017-05-22 DIAGNOSIS — H2513 Age-related nuclear cataract, bilateral: Secondary | ICD-10-CM | POA: Diagnosis not present

## 2017-07-17 ENCOUNTER — Encounter: Payer: Self-pay | Admitting: Internal Medicine

## 2017-07-17 ENCOUNTER — Other Ambulatory Visit (INDEPENDENT_AMBULATORY_CARE_PROVIDER_SITE_OTHER): Payer: PPO

## 2017-07-17 ENCOUNTER — Ambulatory Visit: Payer: PPO | Admitting: Internal Medicine

## 2017-07-17 VITALS — BP 140/90 | HR 60 | Ht 71.0 in | Wt 198.0 lb

## 2017-07-17 DIAGNOSIS — J441 Chronic obstructive pulmonary disease with (acute) exacerbation: Secondary | ICD-10-CM

## 2017-07-17 DIAGNOSIS — J45991 Cough variant asthma: Secondary | ICD-10-CM | POA: Diagnosis not present

## 2017-07-17 LAB — NITRIC OXIDE: Nitric Oxide: 7

## 2017-07-17 LAB — CBC WITH DIFFERENTIAL/PLATELET
BASOS ABS: 0 10*3/uL (ref 0.0–0.1)
Basophils Relative: 0.2 % (ref 0.0–3.0)
EOS PCT: 1.1 % (ref 0.0–5.0)
Eosinophils Absolute: 0.1 10*3/uL (ref 0.0–0.7)
HCT: 48 % (ref 39.0–52.0)
Hemoglobin: 15.8 g/dL (ref 13.0–17.0)
LYMPHS PCT: 29.4 % (ref 12.0–46.0)
Lymphs Abs: 3.5 10*3/uL (ref 0.7–4.0)
MCHC: 32.8 g/dL (ref 30.0–36.0)
MCV: 86.5 fl (ref 78.0–100.0)
MONO ABS: 1 10*3/uL (ref 0.1–1.0)
Monocytes Relative: 8.3 % (ref 3.0–12.0)
NEUTROS ABS: 7.3 10*3/uL (ref 1.4–7.7)
Neutrophils Relative %: 61 % (ref 43.0–77.0)
Platelets: 369 10*3/uL (ref 150.0–400.0)
RBC: 5.55 Mil/uL (ref 4.22–5.81)
RDW: 15.6 % — ABNORMAL HIGH (ref 11.5–15.5)
WBC: 11.9 10*3/uL — ABNORMAL HIGH (ref 4.0–10.5)

## 2017-07-17 MED ORDER — PREDNISONE 10 MG PO TABS
ORAL_TABLET | ORAL | 0 refills | Status: DC
Start: 2017-07-17 — End: 2017-08-10

## 2017-07-17 NOTE — Patient Instructions (Addendum)
Prednisone 10 mg take  4 each am x 2 days,   2 each am x 2 days,  1 each am x 2 days and stop    Plan A = Automatic = continue  symbicort 160  Take 2 puffs first thing in am and then another 2 puffs about 12 hours later and be sure you take your pepcid (famotidine) at bedtime   Work on inhaler technique:  relax and gently blow all the way out then take a nice smooth deep breath back in, triggering the inhaler at same time you start breathing in.  Hold for up to 5 seconds if you can. Blow out thru nose. Rinse and gargle with water when done   Plan B = Backup Only use your albuterol as a rescue medication to be used if you can't catch your breath by resting or doing a relaxed purse lip breathing pattern.  - The less you use it, the better it will work when you need it. - Ok to use the inhaler up to 2 puffs  every 4 hours if you must but call for appointment if use goes up over your usual need - Don't leave home without it !!  (think of it like the spare tire for your car)     keep your appt for may - call sooner if needed

## 2017-07-17 NOTE — Progress Notes (Signed)
Subjective:     Patient ID: Derrick Moon, male   DOB: [birth year]   MRN: [id number]    Brief patient profile:  33 yowm quit smoking cigs 1990 / still smoking some cigars until 01/23/17  referred to pulmonary clinic 12/12/2015 by Dr   Willey Blade for sob s/p neg cards eval by Dr Dwana Curd (though doen'st look like pt completed the myocardial perfusion study scheduled for 08/15/15) with COPD GOLD II dx      .History of Present Illness  12/12/2015 1st [facility] office visit/ Sybil Shrader   Chief Complaint  Patient presents with  . Pulmonary Consult    Referred by Dr. [name]. Pt c/o dizziness with exertion for the past 6 months. His spouse states after he has been outside working, he comes in SOB.  He has prod cough with clear sputum in the am's.   variably noisy breathing x 6 months seems to bother wife more than husband  MMRC2 = can't walk a nl pace on a flat grade s sob but does fine slow and flat eg grocery shopping  Sleeping fine off cpap but wakes up every am with throat congestion/ uses halls  spiriva not helping / hfa may help/ prednisone does not help rec avapro  150 mg one daily - if light headed when you stand up break it in half  Stop spiriva and lisinopril and the halls for now GERD diet Only use your albuterol as a rescue medication      12/12/2016  f/u ov/Oliwia Berzins re: copd GOLD II/ symb 160 2bid and daily albuterol around 2 pm Chief Complaint  Patient presents with  . Follow-up    Pt states he is here for his annual followup.  He states his breathing is "pretty good".  He states he was seen by his PCP on 12/03/16 and was dxed with lung infection- txed with pred and doxy.  He has some cough with large amounts of clear sputum.  He is using his albuterol inhaler 1 x daily on average.   worse for several months cough/ congestion esp after supper, does ok at hs/noct  But wakes up most  ams with  One tbsp thick mucus while on symbicort rx pred/doxy > 50% improved / still smoking mostly  cigars  Much better breathing  indoors vs outdoors but still mmrc = 2  Needing benadryl to sleep due to sense of pnds   Needing sab at least once daily  rec Plan A = Automatic = symbicort 160 x2 puffs followed by spiriva 2 puffs first thing in am then 12 hours later 2 pffs symbicort only Work on inhaler technique:    Plan B = Backup Only use your albuterol as a rescue medication Please remember to go to the  x-ray department downstairs in the basement  for your tests - we will call you with the results when they are available. The key is to stop smoking completely before smoking completely stops you!            Admit date: 02/02/2017 Discharge date: 02/08/2017   Discharge Diagnoses:  Severe sepsis secondary to respiratory infection Acute on chronic respiratory failure with hypoxemia Atrial fibrillation with RVR, new Acute kidney injury Hypertension BPH Tobacco abuse Abnormal TSH/Hyperthyroidism          02/19/2017   Post hosp ext f/u ov/Akiya Morr re: transition of care / on 2lpm at hs/ one or two prednisone left in bottle  Chief Complaint  Patient presents with  .  Follow-up    Breathing has improved some, but not back to baseline yet. He states "I feel like I'm suffocating" with exertion and he can not lie down flat.   doe = MMRC2 = can't walk a nl pace on a flat grade s sob but does fine slow and flat eg [employer]  Sleeping in recliner on 3lpm maybe 30 degrees x years No need for saba since d/c but sore throat on both spiriva and bevespi  rec Goal is to keep your 02 sats above 90% at all times so continue 02 2lpm at bedtime and as needed during the day but you need to monitor it when you walk/ exert to be sure your saturations are adequate Pantoprazole (protonix) 40 mg   Take  30-60 min before first meal of the day and Pepcid (famotidine)  20 mg one @  bedtime until return to office - this is the best way to tell whether stomach acid is contributing to your problem.    GERD diet      05/11/2017  f/u ov/Eniyah Eastmond re:  COPD II / still smoking cigars  No meds in hand as req  Chief Complaint  Patient presents with  . Follow-up    Breathing is about the same.  He is coughing with clear sputum.  He is using his albuterol inhaler 2-3 x per wk on average. He stopped using his o2 approx 1 wk ago.   stopped 02 2 weeks prior to OV   Dyspnea:  Able to do yardwork slow pace = MMRC2 = can't walk a nl pace on a flat grade s sob but does fine slow and flat  Cough: sometimes p supper min mucoid Sleep: ok   rec Ok to stop 02  Plan A = Automatic = change bevespi to symbicort 160  Take 2 puffs first thing in am and then another 2 puffs about 12 hours later.  Work on inhaler technique:  relax and gently Plan B = Backup Only use your albuterol as a rescue medication      07/17/2017 acute extended ov/Kemyra August re:  Copd II / symb 160 bid >> says no longer smoking / brought meds as req Chief Complaint  Patient presents with  . Acute Visit    Increased SOB for approx 2 wks- started after he was mowing for several hours.  He states he has had occ wheezing and congestion in his chest. He is coughing up some clear sputum.   prior to 07/07/17 was doing so much better s saba decided to work in the yard for 6 hours but  Noted need for saba while mowing and downhill since then with  cough/ wheezing sob worse in afternoons  And using saba 2 x 3 x daily at most Sleeps 1 pillow, no need for saba noct   No obvious day to day or daytime variability or assoc excess/ purulent sputum or mucus plugs or hemoptysis or cp or chest tightness, or overt sinus or hb symptoms. No unusual exposure hx or h/o childhood pna/ asthma or knowledge of premature birth.  Sleeping  1 pillow  without nocturnal  or early am exacerbation  of respiratory  c/o's or need for noct saba. Also denies any obvious fluctuation of symptoms with weather or environmental changes or other aggravating or alleviating factors except as  outlined above   Current Allergies, Complete Past Medical History, Past Surgical History, Family History, and Social History were reviewed in [employer] record.  ROS  The following are not active complaints unless bolded Hoarseness, sore throat, dysphagia, dental problems, itching, sneezing,  nasal congestion or discharge of excess mucus or purulent secretions, ear ache,   fever, chills, sweats, unintended wt loss or wt gain, classically pleuritic or exertional cp,  orthopnea pnd or arm/hand swelling  or leg swelling, presyncope, palpitations, abdominal pain, anorexia, nausea, vomiting, diarrhea  or change in bowel habits or change in bladder habits, change in stools or change in urine, dysuria, hematuria,  rash, arthralgias, visual complaints, headache, numbness, weakness or ataxia or problems with walking or coordination,  change in mood or  memory.        Current Meds  Medication Sig  . acetaminophen (TYLENOL) 500 MG tablet Take 1,000 mg every 6 (six) hours as needed by mouth.  [name] albuterol (PROVENTIL HFA;VENTOLIN HFA) 108 (90 BASE) MCG/ACT inhaler Inhale 1-2 puffs into the lungs every 6 (six) hours as needed for wheezing or shortness of breath. Reported on 10/03/2015  . apixaban (ELIQUIS) 5 MG TABS tablet Take 1 tablet (5 mg total) 2 (two) times daily by mouth.  . budesonide-formoterol (SYMBICORT) 160-4.5 MCG/ACT inhaler Inhale 2 puffs into the lungs 2 (two) times daily.  . diphenhydrAMINE (BENADRYL) 25 MG tablet Take 25 mg by mouth every 6 (six) hours as needed.  . famotidine (PEPCID) 20 MG tablet One at bedtime  . guaifenesin (HUMIBID E) 400 MG TABS tablet Take 400 mg by mouth every 4 (four) hours.  . metoprolol tartrate (LOPRESSOR) 25 MG tablet Take 1 tablet (25 mg total) 2 (two) times daily by mouth.  . OXYGEN 2lpm with sleep and if needed during the daytime  . pantoprazole (PROTONIX) 40 MG tablet Take 1 tablet (40 mg total) by mouth daily.  . Tamsulosin HCl (FLOMAX)  0.4 MG CAPS Take 0.4 mg by mouth daily.                   Objective:   Physical Exam  amb wm nad    07/17/2017       198  05/11/2017        198  03/30/2017          194  02/20/2017     189 02/02/2017      189  12/12/2016        201  01/09/2016      198   12/12/15 198 lb (89.8 kg)  11/09/15 201 lb 12.8 oz (91.5 kg)  10/03/15 199 lb (90.3 kg)     Vital signs reviewed - Note on arrival 02 sats  93% on RA       HEENT: nl dentition, turbinates bilaterally, and oropharynx. Nl external ear canals without cough reflex   NECK :  without JVD/Nodes/TM/ nl carotid upstrokes bilaterally   LUNGS: no acc muscle use,  Nl contour chest  insp and exp sonorous rhonchi bilaterally without cough on insp or exp maneuvers   CV:  RRR  no s3 or murmur or increase in P2, and no edema   ABD:  soft and nontender with nl inspiratory excursion in the supine position. No bruits or organomegaly appreciated, bowel sounds nl  MS:  Nl gait/ ext warm without deformities, calf tenderness, cyanosis or clubbing No obvious joint restrictions   SKIN: warm and dry with mild chronic venous changes both le's  NEURO:  alert, approp, nl sensorium with  no motor or cerebellar deficits apparent.        Labs ordered 07/17/2017  Allergy  profile            Assessment:

## 2017-07-18 ENCOUNTER — Encounter: Payer: Self-pay | Admitting: Internal Medicine

## 2017-07-18 NOTE — Assessment & Plan Note (Signed)
FENO 07/17/2017  =   7 - Allergy profile 07/17/2017 >  Eos 0.1 /  IgE    Suspect the deterioration from mowing was non-specific but will complete the w/u with allergy profile and rx is the same = pred/ symbicort/ consider adding singulair empirically if not improving.

## 2017-07-18 NOTE — Assessment & Plan Note (Addendum)
-   PFTs  05/27/12   FEV1  2.04(62%) ratio 54  - 12/12/2015 try off spiriva since pt says not working > increased need for saba - Spirometry 01/09/2016  FEV1 1.19 (47%)  Ratio 50 p nothing prior and classic curvature on f/v loop - 01/09/2016  After extensive coaching HFA effectiveness =    75% try symbicort 160 2bid   - 12/12/2016  After extensive coaching devie effectiveness =    75% with smi > add spiriva 2 each am  - Spirometry 01/08/2017  FEV1 1.52 (48%)  Ratio 59    - 01/08/2017 trial of bevespi   - 05/11/2017    > pt req change back to symb from bevespi    - 07/17/2017  After extensive coaching inhaler device  effectiveness =    75% (short Ti)    Acute flare p exp to dust/grass mid April 2019  > ov 07/17/2017 related to grass exposure   rec allergy profile/ pred x 6 days/ work oh hfa     I had an extended discussion with the patient reviewing all relevant studies completed to date and  lasting 15 to 20 minutes of a 25 minute acute office visit    Each maintenance medication was reviewed in detail including most importantly the difference between maintenance and prns and under what circumstances the prns are to be triggered using an action plan format that is not reflected in the computer generated alphabetically organized AVS.    Please see AVS for specific instructions unique to this visit that I personally wrote and verbalized to the the pt in detail and then reviewed with pt  by my nurse highlighting any  changes in therapy recommended at today's visit to their plan of care.

## 2017-07-20 LAB — RESPIRATORY ALLERGY PROFILE REGION II ~~LOC~~
Allergen, Cedar tree, t12: <0.1 kU/L
Allergen, Mulberry, t76: <0.1 kU/L
Allergen, Oak,t7: <0.1 kU/L
Allergen, P. notatum, m1: <0.1 kU/L
Aspergillus fumigatus, m3: <0.1 kU/L
CLADOSPORIUM HERBARUM (M2) IGE: <0.1 kU/L
CLASS: 0
CLASS: 0
CLASS: 0
CLASS: 0
CLASS: 0
CLASS: 0
CLASS: 0
COMMON RAGWEED (SHORT) (W1) IGE: <0.1 kU/L
Cat Dander: 0.32 kU/L — ABNORMAL HIGH
Class: 0
Class: 0
Class: 0
Class: 0
Class: 0
Class: 0
Class: 0
Class: 0
Class: 0
Class: 0
Class: 0
Class: 0
Class: 0
Class: 0
Class: 0
Class: 0
Class: 0
Cockroach: <0.1 kU/L
D. farinae: <0.1 kU/L
DOG DANDER: 0.32 kU/L — AB
IGE (IMMUNOGLOBULIN E), SERUM: 30 kU/L (ref <=114)
Pecan/Hickory Tree IgE: <0.1 kU/L
Rough Pigweed  IgE: <0.1 kU/L
Timothy Grass: <0.1 kU/L

## 2017-07-20 LAB — INTERPRETATION:

## 2017-07-21 DIAGNOSIS — I480 Paroxysmal atrial fibrillation: Secondary | ICD-10-CM | POA: Diagnosis not present

## 2017-07-21 DIAGNOSIS — I714 Abdominal aortic aneurysm, without rupture: Secondary | ICD-10-CM | POA: Diagnosis not present

## 2017-07-21 DIAGNOSIS — J449 Chronic obstructive pulmonary disease, unspecified: Secondary | ICD-10-CM | POA: Diagnosis not present

## 2017-07-21 NOTE — Progress Notes (Signed)
Spoke with pt and notified of results per Dr. Wert. Pt verbalized understanding and denied any questions. 

## 2017-08-10 ENCOUNTER — Ambulatory Visit: Payer: PPO | Admitting: Internal Medicine

## 2017-08-10 ENCOUNTER — Encounter: Payer: Self-pay | Admitting: Internal Medicine

## 2017-08-10 ENCOUNTER — Ambulatory Visit (INDEPENDENT_AMBULATORY_CARE_PROVIDER_SITE_OTHER)
Admission: RE | Admit: 2017-08-10 | Discharge: 2017-08-10 | Disposition: A | Discharge status: Home / Self Care | Payer: PPO | Source: Ambulatory Visit | Attending: Internal Medicine | Admitting: Internal Medicine

## 2017-08-10 VITALS — BP 122/80 | HR 56 | Ht 71.0 in | Wt 202.0 lb

## 2017-08-10 DIAGNOSIS — I10 Essential (primary) hypertension: Secondary | ICD-10-CM

## 2017-08-10 DIAGNOSIS — R059 Cough, unspecified: Secondary | ICD-10-CM

## 2017-08-10 DIAGNOSIS — R05 Cough: Secondary | ICD-10-CM

## 2017-08-10 DIAGNOSIS — J45991 Cough variant asthma: Secondary | ICD-10-CM | POA: Diagnosis not present

## 2017-08-10 DIAGNOSIS — J449 Chronic obstructive pulmonary disease, unspecified: Secondary | ICD-10-CM | POA: Diagnosis not present

## 2017-08-10 DIAGNOSIS — R918 Other nonspecific abnormal finding of lung field: Secondary | ICD-10-CM

## 2017-08-10 MED ORDER — PREDNISONE 10 MG PO TABS: ORAL_TABLET | ORAL | 0 refills | Status: DC

## 2017-08-10 MED ORDER — MONTELUKAST SODIUM 10 MG PO TABS: 10.0000 mg | ORAL_TABLET | Freq: Every day | ORAL | 11 refills | Status: DC

## 2017-08-10 NOTE — Patient Instructions (Addendum)
Add singulair (montelukast) 10 mg one daily at bedtime to see if helps night time cough but this is not an antedote to the dog you are sleeping with   Reduce the lopressor (metaprolol) to one half tablet twice daily   Change pepcid to take right after supper to see if helps the cough after supper   Continue symbicort 160(same as dulera 200)  Take 2 puffs first thing in am and then another 2 puffs about 12 hours later.   Work on inhaler technique:  relax and gently blow all the way out then take a nice smooth deep breath back in, triggering the inhaler at same time you start breathing in.  Hold for up to 5 seconds if you can. Blow out thru nose. Rinse and gargle with water when done      If no better > Prednisone 10 mg take  4 each am x 2 days,   2 each am x 2 days,  1 each am x 2 days and stop   Please see patient coordinator before you leave today  to schedule sinus CT   Please remember to go to the  x-ray department downstairs in the basement  for your tests - we will call you with the results when they are available.   Please schedule a follow up office visit in 6 weeks, call sooner if needed with all medications /inhalers/ solutions in hand so we can verify exactly what you are taking. This includes all medications from all doctors and over the counters

## 2017-08-10 NOTE — Progress Notes (Signed)
Subjective:    Patient ID: Derrick Moon, male   DOB: 08/10/1940   MRN: [id number]    Brief patient profile:  63 yowm quit smoking cigs 1990 / still smoking some cigars until 01/23/17  referred to pulmonary clinic 12/12/2015 by Dr   Willey Blade for sob s/p neg cards eval by Dr Dwana Curd (though doen'st look like pt completed the myocardial perfusion study scheduled for 08/15/15) with COPD GOLD II dx      .History of Present Illness  12/12/2015 1st [facility] office visit/ Derrick Moon   Chief Complaint  Patient presents with  . Pulmonary Consult    Referred by Dr. [name]. Pt c/o dizziness with exertion for the past 6 months. His spouse states after he has been outside working, he comes in SOB.  He has prod cough with clear sputum in the am's.   variably noisy breathing x 6 months seems to bother wife more than husband  MMRC2 = can't walk a nl pace on a flat grade s sob but does fine slow and flat eg grocery shopping  Sleeping fine off cpap but wakes up every am with throat congestion/ uses halls  spiriva not helping / hfa may help/ prednisone does not help rec avapro  150 mg one daily - if light headed when you stand up break it in half  Stop spiriva and lisinopril and the halls for now GERD diet Only use your albuterol as a rescue medication      12/12/2016  f/u ov/Derrick Moon re: copd GOLD II/ symb 160 2bid and daily albuterol around 2 pm Chief Complaint  Patient presents with  . Follow-up    Pt states he is here for his annual followup.  He states his breathing is "pretty good".  He states he was seen by his PCP on 12/03/16 and was dxed with lung infection- txed with pred and doxy.  He has some cough with large amounts of clear sputum.  He is using his albuterol inhaler 1 x daily on average.   worse for several months cough/ congestion esp after supper, does ok at hs/noct  But wakes up most  ams with  One tbsp thick mucus while on symbicort rx pred/doxy > 50% improved / still smoking mostly  cigars  Much better breathing  indoors vs outdoors but still mmrc = 2  Needing benadryl to sleep due to sense of pnds   Needing sab at least once daily  rec Plan A = Automatic = symbicort 160 x2 puffs followed by spiriva 2 puffs first thing in am then 12 hours later 2 pffs symbicort only Work on inhaler technique:    Plan B = Backup Only use your albuterol as a rescue medication Please remember to go to the  x-ray department downstairs in the basement  for your tests - we will call you with the results when they are available. The key is to stop smoking completely before smoking completely stops you!            Admit date: 02/02/2017 Discharge date: 02/08/2017   Discharge Diagnoses:  Severe sepsis secondary to respiratory infection Acute on chronic respiratory failure with hypoxemia Atrial fibrillation with RVR, new Acute kidney injury Hypertension BPH Tobacco abuse Abnormal TSH/Hyperthyroidism       02/19/2017   Post hosp ext f/u ov/Derrick Moon re: transition of care / on 2lpm at hs/ one or two prednisone left in bottle  Chief Complaint  Patient presents with  . Follow-up  Breathing has improved some, but not back to baseline yet. He states "I feel like I'm suffocating" with exertion and he can not lie down flat.   doe = MMRC2 = can't walk a nl pace on a flat grade s sob but does fine slow and flat eg [employer]  Sleeping in recliner on 3lpm maybe 30 degrees x years No need for saba since d/c but sore throat on both spiriva and bevespi  rec Goal is to keep your 02 sats above 90% at all times so continue 02 2lpm at bedtime and as needed during the day but you need to monitor it when you walk/ exert to be sure your saturations are adequate Pantoprazole (protonix) 40 mg   Take  30-60 min before first meal of the day and Pepcid (famotidine)  20 mg one @  bedtime until return to office - this is the best way to tell whether stomach acid is contributing to your problem.   GERD  diet      05/11/2017  f/u ov/Derrick Moon re:  COPD II / still smoking cigars  No meds in hand as req  Chief Complaint  Patient presents with  . Follow-up    Breathing is about the same.  He is coughing with clear sputum.  He is using his albuterol inhaler 2-3 x per wk on average. He stopped using his o2 approx 1 wk ago.   stopped 02 2 weeks prior to OV   Dyspnea:  Able to do yardwork slow pace = MMRC2 = can't walk a nl pace on a flat grade s sob but does fine slow and flat  Cough: sometimes p supper min mucoid Sleep: ok   rec Ok to stop 02  Plan A = Automatic = change bevespi to symbicort 160  Take 2 puffs first thing in am and then another 2 puffs about 12 hours later.  Work on inhaler technique:  relax and gently Plan B = Backup Only use your albuterol as a rescue medication      07/17/2017 acute extended ov/Derrick Moon re:  Copd II / symb 160 bid >> says no longer smoking / brought meds as req Chief Complaint  Patient presents with  . Acute Visit    Increased SOB for approx 2 wks- started after he was mowing for several hours.  He states he has had occ wheezing and congestion in his chest. He is coughing up some clear sputum.   prior to 07/07/17 was doing so much better s saba decided to work in the yard for 6 hours but  Noted need for saba while mowing and downhill since then with  cough/ wheezing sob worse in afternoons  And using saba 2 x 3 x daily at most Sleeps 1 pillow, no need for saba noct  rec Prednisone 10 mg take  4 each am x 2 days,   2 each am x 2 days,  1 each am x 2 days and stop  Plan A = Automatic = continue  symbicort 160  Take 2 puffs first thing in am and then another 2 puffs about 12 hours later and be sure you take your pepcid (famotidine) at bedtime  Work on inhaler technique:   Plan B = Backup Only use your albuterol as a rescue medication      08/10/2017  f/u ov/Derrick Moon re:  Copd II/ AB component / dog allergy  Chief Complaint  Patient presents with  . Follow-up     Breathing has  improved slightly. He states he feels worse when he goes outside in the heat.   Dyspnea: MMRC2 = can't walk a nl pace on a flat grade s sob but does fine slow and flat  Cough: every noct arount 3-4 am cough congestion maybe a tsp of white mucus Typically after supper same problem with cough  Sleep: L side horizontal with dog/ coughs worse if faces dog side  SABA use:  Def helps / using it twice daily if goes outside  / not typically needing noct   No obvious day to day or daytime variability or assoc excess/ purulent sputum or mucus plugs or hemoptysis or cp or chest tightness, subjective wheeze or overt sinus or hb symptoms. No unusual exposure hx or h/o childhood pna/ asthma or knowledge of premature birth.    Also denies any obvious fluctuation of symptoms with weather or environmental changes or other aggravating or alleviating factors except as outlined above   Current Allergies, Complete Past Medical History, Past Surgical History, Family History, and Social History were reviewed in [employer] record.  ROS  The following are not active complaints unless bolded Hoarseness, sore throat, dysphagia, dental problems, itching, sneezing,  nasal congestion or discharge of excess mucus or purulent secretions, ear ache,   fever, chills, sweats, unintended wt loss or wt gain, classically pleuritic or exertional cp,  orthopnea pnd or arm/hand swelling  or leg swelling, presyncope, palpitations, abdominal pain, anorexia, nausea, vomiting, diarrhea  or change in bowel habits or change in bladder habits, change in stools or change in urine, dysuria, hematuria,  rash, arthralgias, visual complaints, headache, numbness, weakness or ataxia or problems with walking or coordination,  change in mood or  memory.        Current Meds  Medication Sig  . acetaminophen (TYLENOL) 500 MG tablet Take 1,000 mg every 6 (six) hours as needed by mouth.  [name] albuterol (PROVENTIL  HFA;VENTOLIN HFA) 108 (90 BASE) MCG/ACT inhaler Inhale 1-2 puffs into the lungs every 6 (six) hours as needed for wheezing or shortness of breath. Reported on 10/03/2015  . apixaban (ELIQUIS) 5 MG TABS tablet Take 1 tablet (5 mg total) 2 (two) times daily by mouth.  . budesonide-formoterol (SYMBICORT) 160-4.5 MCG/ACT inhaler Inhale 2 puffs into the lungs 2 (two) times daily.  . diphenhydrAMINE (BENADRYL) 25 MG tablet Take 25 mg by mouth every 6 (six) hours as needed.  . famotidine (PEPCID) 20 MG tablet One at bedtime  . guaifenesin (HUMIBID E) 400 MG TABS tablet Take 400 mg by mouth every 4 (four) hours.  . metoprolol tartrate (LOPRESSOR) 25 MG tablet Take 1 tablet (25 mg total) 2 (two) times daily by mouth.  . OXYGEN 2lpm with sleep and if needed during the daytime  . pantoprazole (PROTONIX) 40 MG tablet Take 1 tablet (40 mg total) by mouth daily.  . Tamsulosin HCl (FLOMAX) 0.4 MG CAPS Take 0.4 mg by mouth daily.                        Objective:   Physical Exam  amb wm nad  08/10/2017        202   07/17/2017       198  05/11/2017        198  03/30/2017          194  02/20/2017     189 02/02/2017      189  12/12/2016  201  01/09/2016      198   12/12/15 198 lb (89.8 kg)  11/09/15 201 lb 12.8 oz (91.5 kg)  10/03/15 199 lb (90.3 kg)      Vital signs reviewed - Note on arrival 02 sats  95% on RA  And pulse 56 on lopressor 25 bid      HEENT: nl dentition, turbinates bilaterally, and oropharynx. Nl external ear canals without cough reflex   NECK :  without JVD/Nodes/TM/ nl carotid upstrokes bilaterally   LUNGS: no acc muscle use,  Nl contour chest with mild late exp rhonchi bilaterally without cough on insp or exp maneuvers   CV:  RRR  no s3 or murmur or increase in P2, and no edema   ABD:  Mod obese/ soft and nontender with nl inspiratory excursion in the supine position. No bruits or organomegaly appreciated, bowel sounds nl  MS:  Nl gait/ ext warm without  deformities, calf tenderness, cyanosis or clubbing No obvious joint restrictions   SKIN: warm and dry with mild chronic venous insuff changes both le's    NEURO:  alert, approp, nl sensorium with  no motor or cerebellar deficits apparent.            CXR PA and Lateral:   08/10/2017 :    I personally reviewed images and  impression as follows:   Mild copd changes only       Assessment:

## 2017-08-11 ENCOUNTER — Encounter: Payer: Self-pay | Admitting: Internal Medicine

## 2017-08-11 NOTE — Assessment & Plan Note (Signed)
Trial off acei 12/12/2015 >> improved symptoms as of 01/09/2016 > stay off acei indefinitely   Also may have component of BB induced cough/ airflow obst > try reduce lopressor by half on a trial basis  As pulse /bp low today on 25 bid

## 2017-08-11 NOTE — Progress Notes (Signed)
Spoke with pt and notified of results per Dr. Wert. Pt verbalized understanding and denied any questions. 

## 2017-08-11 NOTE — Assessment & Plan Note (Signed)
All acute changes have resolved, no directed f/u needed

## 2017-08-11 NOTE — Assessment & Plan Note (Signed)
FENO 07/17/2017  =   7 - Allergy profile 07/17/2017 >  Eos 0.1 /  IgE  30 RAST pos cat = dog - added singulair 08/10/2017 and reduced lopressor rx trial basis   DDX of  difficult airways management almost all start with A and  include Adherence, Ace Inhibitors, Acid Reflux, Active Sinus Disease, Alpha 1 Antitripsin deficiency, Anxiety masquerading as Airways dz,  ABPA,  Allergy(esp in young), Aspiration (esp in elderly), Adverse effects of meds,  Active smokers, A bunch of PE's (a small clot burden can't cause this syndrome unless there is already severe underlying pulm or vascular dz with poor reserve) plus two Bs  = Bronchiectasis and Beta blocker use..and one C= CHF  Adherence is always the initial "prime suspect" and is a multilayered concern that requires a "trust but verify" approach in every patient - starting with knowing how to use medications, especially inhalers, correctly, keeping up with refills and understanding the fundamental difference between maintenance and prns vs those medications only taken for a very short course and then stopped and not refilled.  - see hfa teaching  ? Acid (or non-acid) GERD > always difficult to exclude as up to 75% of pts in some series report no assoc GI/ Heartburn symptoms> rec max (24h)  acid suppression and diet restrictions/ reviewed and instructions given in writing.   Changed pepcid to p supper to see if helps the cough p supper  ? Allergy to dog suggested by RAST and hx > continue high dose ics and add singulair, to allergist next if not better   ? Active sinus dz > sinus ct next    ? BB > appears over Beta blocked > rec trial of decrease lopressor

## 2017-08-11 NOTE — Assessment & Plan Note (Addendum)
-   PFTs  05/27/12   FEV1  2.04(62%) ratio 54  - 12/12/2015 try off spiriva since pt says not working > increased need for saba - Spirometry 01/09/2016  FEV1 1.19 (47%)  Ratio 50 p nothing prior and classic curvature on f/v loop - 01/09/2016  After extensive coaching HFA effectiveness =    75% try symbicort 160 2bid   - 12/12/2016  After extensive coaching devie effectiveness =    75% with smi > add spiriva 2 each am  - Spirometry 01/08/2017  FEV1 1.52 (48%)  Ratio 59    - 01/08/2017 trial of bevespi    - 05/11/2017 pt req change back to symb from bevespi   - Spirometry 08/10/2017  FEV1 1.54 (49%)  Ratio 64   - 08/10/2017  After extensive coaching inhaler device  effectiveness =    90% from baseline of 75%    Pt continues to act more like AB than COPDe in terms of symptoms and no real flares on just laba/ics so no change in rx needed

## 2017-08-19 ENCOUNTER — Encounter (INDEPENDENT_AMBULATORY_CARE_PROVIDER_SITE_OTHER): Payer: Self-pay

## 2017-08-19 ENCOUNTER — Ambulatory Visit (INDEPENDENT_AMBULATORY_CARE_PROVIDER_SITE_OTHER)
Admission: RE | Admit: 2017-08-19 | Discharge: 2017-08-19 | Disposition: A | Discharge status: Home / Self Care | Payer: PPO | Source: Ambulatory Visit | Attending: Internal Medicine | Admitting: Internal Medicine

## 2017-08-19 DIAGNOSIS — J45991 Cough variant asthma: Secondary | ICD-10-CM | POA: Diagnosis not present

## 2017-08-19 DIAGNOSIS — J45909 Unspecified asthma, uncomplicated: Secondary | ICD-10-CM | POA: Diagnosis not present

## 2017-08-19 DIAGNOSIS — R05 Cough: Secondary | ICD-10-CM

## 2017-08-19 DIAGNOSIS — R059 Cough, unspecified: Secondary | ICD-10-CM

## 2017-08-20 ENCOUNTER — Other Ambulatory Visit: Payer: Self-pay | Admitting: Internal Medicine

## 2017-08-20 DIAGNOSIS — J0100 Acute maxillary sinusitis, unspecified: Secondary | ICD-10-CM | POA: Insufficient documentation

## 2017-08-20 MED ORDER — AMOXICILLIN-POT CLAVULANATE 875-125 MG PO TABS: 1.0000 | ORAL_TABLET | Freq: Two times a day (BID) | ORAL | 0 refills | Status: DC

## 2017-08-20 NOTE — Progress Notes (Signed)
Spoke with pt and notified of results per Dr. Wert. Pt verbalized understanding and denied any questions. 

## 2017-08-31 DIAGNOSIS — C4441 Basal cell carcinoma of skin of scalp and neck: Secondary | ICD-10-CM | POA: Diagnosis not present

## 2017-08-31 DIAGNOSIS — X32XXXD Exposure to sunlight, subsequent encounter: Secondary | ICD-10-CM | POA: Diagnosis not present

## 2017-08-31 DIAGNOSIS — L570 Actinic keratosis: Secondary | ICD-10-CM | POA: Diagnosis not present

## 2017-08-31 DIAGNOSIS — D225 Melanocytic nevi of trunk: Secondary | ICD-10-CM | POA: Diagnosis not present

## 2017-09-17 ENCOUNTER — Ambulatory Visit (INDEPENDENT_AMBULATORY_CARE_PROVIDER_SITE_OTHER)
Admission: RE | Admit: 2017-09-17 | Discharge: 2017-09-17 | Disposition: A | Discharge status: Home / Self Care | Payer: PPO | Source: Ambulatory Visit | Attending: Internal Medicine | Admitting: Internal Medicine

## 2017-09-17 DIAGNOSIS — J0100 Acute maxillary sinusitis, unspecified: Secondary | ICD-10-CM | POA: Diagnosis not present

## 2017-09-18 NOTE — Progress Notes (Signed)
Spoke with pt and notified of results per Dr. Wert. Pt verbalized understanding and denied any questions. 

## 2017-09-21 ENCOUNTER — Ambulatory Visit: Payer: PPO | Admitting: Internal Medicine

## 2017-09-21 ENCOUNTER — Encounter: Payer: Self-pay | Admitting: Internal Medicine

## 2017-09-21 VITALS — BP 132/82 | HR 61 | Ht 71.0 in | Wt 206.0 lb

## 2017-09-21 DIAGNOSIS — J449 Chronic obstructive pulmonary disease, unspecified: Secondary | ICD-10-CM | POA: Diagnosis not present

## 2017-09-21 DIAGNOSIS — J45991 Cough variant asthma: Secondary | ICD-10-CM

## 2017-09-21 NOTE — Patient Instructions (Signed)
Work on maintaining perfect  inhaler technique:  relax and gently blow all the way out then take a nice smooth deep breath back in, triggering the inhaler at same time you start breathing in.  Hold for up to 5 seconds if you can. Blow out thru nose. Rinse and gargle with water when done      Only use your albuterol as a rescue medication to be used if you can't catch your breath by resting or doing a relaxed purse lip breathing pattern.  - The less you use it, the better it will work when you need it. - Ok to use up to 2 puffs  every 4 hours if you must but call for immediate appointment if use goes up over your usual need - Don't leave home without it !!  (think of it like the spare tire for your car)    Please schedule a follow up visit in 3 months but call sooner if needed

## 2017-09-21 NOTE — Progress Notes (Signed)
Subjective:    Patient ID: Derrick Moon, male   DOB: 07/08/1940   MRN: [id number]    Brief patient profile:  55 yowm quit smoking cigarettes 1990 / still smoking some cigars    referred to pulmonary clinic 12/12/2015 by Dr   Willey Blade for sob s/p neg cards eval by Dr Dwana Curd (though ? If pt completed the myocardial perfusion study scheduled for 08/15/15) with COPD GOLD II dx      .History of Present Illness  12/12/2015 1st [facility] office visit/ [name]   Chief Complaint  Patient presents with  . Pulmonary Consult    Referred by Dr. [name]. Pt c/o dizziness with exertion for the past 6 months. His spouse states after he has been outside working, he comes in SOB.  He has prod cough with clear sputum in the am's.   variably noisy breathing x 6 months seems to bother wife more than husband  MMRC2 = can't walk a nl pace on a flat grade s sob but does fine slow and flat eg grocery shopping  Sleeping fine off cpap but wakes up every am with throat congestion/ uses halls  spiriva not helping / hfa may help/ prednisone does not help rec avapro  150 mg one daily - if light headed when you stand up break it in half  Stop spiriva and lisinopril and the halls for now GERD diet Only use your albuterol as a rescue medication      12/12/2016  f/u ov/[name] re: copd GOLD II/ symb 160 2bid and daily albuterol around 2 pm Chief Complaint  Patient presents with  . Follow-up    Pt states he is here for his annual followup.  He states his breathing is "pretty good".  He states he was seen by his PCP on 12/03/16 and was dxed with lung infection- txed with pred and doxy.  He has some cough with large amounts of clear sputum.  He is using his albuterol inhaler 1 x daily on average.   worse for several months cough/ congestion esp after supper, does ok at hs/noct  But wakes up most  ams with  One tbsp thick mucus while on symbicort rx pred/doxy > 50% improved / still smoking mostly cigars  Much better  breathing  indoors vs outdoors but still mmrc = 2  Needing benadryl to sleep due to sense of pnds   Needing sab at least once daily  rec Plan A = Automatic = symbicort 160 x2 puffs followed by spiriva 2 puffs first thing in am then 12 hours later 2 pffs symbicort only Work on inhaler technique:    Plan B = Backup Only use your albuterol as a rescue medication Please remember to go to the  x-ray department downstairs in the basement  for your tests - we will call you with the results when they are available. The key is to stop smoking completely before smoking completely stops you!            Admit date: 02/02/2017 Discharge date: 02/08/2017   Discharge Diagnoses:  Severe sepsis secondary to respiratory infection Acute on chronic respiratory failure with hypoxemia Atrial fibrillation with RVR, new Acute kidney injury Hypertension BPH Tobacco abuse Abnormal TSH/Hyperthyroidism       02/19/2017   Post hosp ext f/u ov/[name] re: transition of care / on 2lpm at hs/ one or two prednisone left in bottle  Chief Complaint  Patient presents with  . Follow-up    Breathing  has improved some, but not back to baseline yet. He states "I feel like I'm suffocating" with exertion and he can not lie down flat.   doe = MMRC2 = can't walk a nl pace on a flat grade s sob but does fine slow and flat eg [employer]  Sleeping in recliner on 3lpm maybe 30 degrees x years No need for saba since d/c but sore throat on both spiriva and bevespi  rec Goal is to keep your 02 sats above 90% at all times so continue 02 2lpm at bedtime and as needed during the day but you need to monitor it when you walk/ exert to be sure your saturations are adequate Pantoprazole (protonix) 40 mg   Take  30-60 min before first meal of the day and Pepcid (famotidine)  20 mg one @  bedtime until return to office - this is the best way to tell whether stomach acid is contributing to your problem.   GERD diet      05/11/2017   f/u ov/[name] re:  COPD II / still smoking cigars  No meds in hand as req  Chief Complaint  Patient presents with  . Follow-up    Breathing is about the same.  He is coughing with clear sputum.  He is using his albuterol inhaler 2-3 x per wk on average. He stopped using his o2 approx 1 wk ago.   stopped 02 2 weeks prior to OV   Dyspnea:  Able to do yardwork slow pace = MMRC2 = can't walk a nl pace on a flat grade s sob but does fine slow and flat  Cough: sometimes p supper min mucoid Sleep: ok   rec Ok to stop 02  Plan A = Automatic = change bevespi to symbicort 160  Take 2 puffs first thing in am and then another 2 puffs about 12 hours later.  Work on inhaler technique:  relax and gently Plan B = Backup Only use your albuterol as a rescue medication      07/17/2017 acute extended ov/[name] re:  Copd II / symb 160 bid >> says no longer smoking / brought meds as req Chief Complaint  Patient presents with  . Acute Visit    Increased SOB for approx 2 wks- started after he was mowing for several hours.  He states he has had occ wheezing and congestion in his chest. He is coughing up some clear sputum.   prior to 07/07/17 was doing so much better s saba decided to work in the yard for 6 hours but  Noted need for saba while mowing and downhill since then with  cough/ wheezing sob worse in afternoons  And using saba 2 x 3 x daily at most Sleeps 1 pillow, no need for saba noct  rec Prednisone 10 mg take  4 each am x 2 days,   2 each am x 2 days,  1 each am x 2 days and stop  Plan A = Automatic = continue  symbicort 160  Take 2 puffs first thing in am and then another 2 puffs about 12 hours later and be sure you take your pepcid (famotidine) at bedtime  Work on inhaler technique:   Plan B = Backup Only use your albuterol as a rescue medication      08/10/2017  f/u ov/[name] re:  Copd II/ AB component / dog allergy  Chief Complaint  Patient presents with  . Follow-up    Breathing has improved  slightly. He states he feels worse when he goes outside in the heat.   Dyspnea: MMRC2 = can't walk a nl pace on a flat grade s sob but does fine slow and flat  Cough: every noct arount 3-4 am cough congestion maybe a tsp of white mucus Typically after supper same problem with cough  Sleep: L side horizontal with dog/ coughs worse if faces dog side  SABA use:  Def helps / using it twice daily if goes outside  / not typically needing noct rec Add singulair (montelukast) 10 mg one daily at bedtime to see if helps night time cough but this is not an antedote to the dog you are sleeping with  Reduce the lopressor (metaprolol) to one half tablet twice daily  Change pepcid to take right after supper to see if helps the cough after supper  Continue symbicort 160(same as dulera 200)  Take 2 puffs first thing in am and then another 2 puffs about 12 hours later.  Work on inhaler technique:  relax and gently blow all the way out then take a nice smooth deep breath back in, triggering the inhaler at same time you start breathing in.  Hold for up to 5 seconds if you can. Blow out thru nose. Rinse and gargle with water when done   Sinus CT 08/19/17  L max sinusitis > augmentin x 20 days > cleared 09/17/17      09/21/2017  f/u ov/Remedios Mckone re:  Copd II/ AB component / dog allergy  Chief Complaint  Patient presents with  . Follow-up    breathing has improved some. He is using his albuterol inhaler 4 x per wk on average.  Dyspnea:  MMRC2 also held back by knees and back  Cough: very little s/p rx for sinusitis   SABA use: rarely needing   No obvious day to day or daytime variability or assoc excess/ purulent sputum or mucus plugs or hemoptysis or cp or chest tightness, subjective wheeze or overt sinus or hb symptoms.   Sleeping: L side horizontal with dog  without nocturnal  or early am exacerbation  of respiratory  c/o's or need for noct saba. Also denies any obvious fluctuation of symptoms with weather or  environmental changes or other aggravating or alleviating factors except as outlined above   No unusual exposure hx or h/o childhood pna/ asthma or knowledge of premature birth.  Current Allergies, Complete Past Medical History, Past Surgical History, Family History, and Social History were reviewed in [employer] [facility] record.  ROS  The following are not active complaints unless bolded Hoarseness, sore throat, dysphagia, dental problems, itching, sneezing,  nasal congestion or discharge of excess mucus or purulent secretions, ear ache,   fever, chills, sweats, unintended wt loss or wt gain, classically pleuritic or exertional cp,  orthopnea pnd or arm/hand swelling  or leg swelling, presyncope, palpitations, abdominal pain, anorexia, nausea, vomiting, diarrhea  or change in bowel habits or change in bladder habits, change in stools or change in urine, dysuria, hematuria,  rash, arthralgias, visual complaints, headache, numbness, weakness or ataxia or problems with walking or coordination,  change in mood or  memory.        Current Meds  Medication Sig  . acetaminophen (TYLENOL) 500 MG tablet Take 1,000 mg every 6 (six) hours as needed by mouth.  [name] albuterol (PROVENTIL HFA;VENTOLIN HFA) 108 (90 BASE) MCG/ACT inhaler Inhale 1-2 puffs into the lungs every 6 (six) hours as needed for wheezing or  shortness of breath. Reported on 10/03/2015  . apixaban (ELIQUIS) 5 MG TABS tablet Take 1 tablet (5 mg total) 2 (two) times daily by mouth.  . budesonide-formoterol (SYMBICORT) 160-4.5 MCG/ACT inhaler Inhale 2 puffs into the lungs 2 (two) times daily.  . diphenhydrAMINE (BENADRYL) 25 MG tablet Take 25 mg by mouth every 6 (six) hours as needed.  . famotidine (PEPCID) 20 MG tablet One at bedtime  . guaifenesin (HUMIBID E) 400 MG TABS tablet Take 400 mg by mouth every 4 (four) hours.  . metoprolol tartrate (LOPRESSOR) 25 MG tablet Take 1 tablet (25 mg total) 2 (two) times daily by mouth.  .  montelukast (SINGULAIR) 10 MG tablet Take 1 tablet (10 mg total) by mouth at bedtime.  . pantoprazole (PROTONIX) 40 MG tablet Take 1 tablet (40 mg total) by mouth daily.  . Tamsulosin HCl (FLOMAX) 0.4 MG CAPS Take 0.4 mg by mouth daily.                  Objective:   Physical Exam  amb wm nad  09/21/2017          206  08/10/2017        202  07/17/2017       198  05/11/2017        198  03/30/2017          194  02/20/2017     189 02/02/2017      189  12/12/2016        201  01/09/2016      198   12/12/15 198 lb (89.8 kg)  11/09/15 201 lb 12.8 oz (91.5 kg)  10/03/15 199 lb (90.3 kg)      Vital signs reviewed - Note on arrival 02 sats  94% on RA     HEENT: nl dentition / oropharynx. Nl external ear canals without cough reflex - mild bilateral non-specific turbinate edema     NECK :  without JVD/Nodes/TM/ nl carotid upstrokes bilaterally   LUNGS: no acc muscle use,  Mild barrel  contour chest wall with bilateral  Distant bs s audible wheeze and  without cough on insp or exp maneuver and mild   Hyperresonant  to  percussion bilaterally     CV:  RRR  no s3 or murmur or increase in P2, and no edema   ABD:  soft and nontender with pos mid insp Hoover's  in the supine position. No bruits or organomegaly appreciated, bowel sounds nl  MS:   Nl gait/  ext warm without deformities, calf tenderness, cyanosis or clubbing No obvious joint restrictions   SKIN: warm and dry without lesions    NEURO:  alert, approp, nl sensorium with  no motor or cerebellar deficits apparent.       I personally reviewed images and agree with radiology impression as follows:  CXR:   08/10/17  Chronic bronchitic changes without acute infiltrate      Assessment:

## 2017-09-22 ENCOUNTER — Encounter: Payer: Self-pay | Admitting: Internal Medicine

## 2017-09-22 NOTE — Assessment & Plan Note (Signed)
FENO 07/17/2017  =   7 - Allergy profile 07/17/2017 >  Eos 0.1 /  IgE  30 RAST pos cat = dog - added singulair 08/10/2017 and reduced lopressor rx trial basis    - Sinus CT 08/19/2017   >  air-fluid level left maxillary sinus. rec augmentin 875 bid x 20 days then repeat sinus CT > done 09/17/2017 and clear - reviewed with pt no further rx

## 2017-09-22 NOTE — Assessment & Plan Note (Addendum)
-   PFTs  05/27/12   FEV1  2.04(62%) ratio 54  - 12/12/2015 try off spiriva since pt says not working > increased need for saba - Spirometry 01/09/2016  FEV1 1.19 (47%)  Ratio 50 p nothing prior and classic curvature on f/v loop - 01/09/2016  After extensive coaching HFA effectiveness =    75% try symbicort 160 2bid   - 12/12/2016  After extensive coaching devie effectiveness =    75% with smi > add spiriva 2 each am  - Spirometry 01/08/2017  FEV1 1.52 (48%)  Ratio 59    - 01/08/2017 trial of bevespi   - 05/11/2017    Changed  back to symb from bevespi -pt preferred  - Spirometry 08/10/2017  FEV1 1.54 (49%)  Ratio 64    - 09/21/2017  After extensive coaching inhaler device  effectiveness =    90%    Preference of symb 160 over bevespi is strong support for COPD/AB / partially related to dog allergy but he refuses to consider even removing dog from bedroom so nothing else needed for now - would also like him to also get rid of the cigars completely which he says is more likely than the dog.  I had an extended discussion with the patient reviewing all relevant studies completed to date and  lasting 15 to 20 minutes of a 25 minute visit    See device teaching which extended face to face time for this visit.  Each maintenance medication was reviewed in detail including emphasizing most importantly the difference between maintenance and prns and under what circumstances the prns are to be triggered using an action plan format that is not reflected in the computer generated alphabetically organized AVS which I have not found useful in most complex patients, especially with respiratory illnesses  Please see AVS for specific instructions unique to this visit that I personally wrote and verbalized to the the pt in detail and then reviewed with pt  by my nurse highlighting any  changes in therapy recommended at today's visit to their plan of care.

## 2017-10-01 DIAGNOSIS — T63461A Toxic effect of venom of wasps, accidental (unintentional), initial encounter: Secondary | ICD-10-CM | POA: Diagnosis not present

## 2017-10-01 DIAGNOSIS — Z85828 Personal history of other malignant neoplasm of skin: Secondary | ICD-10-CM | POA: Diagnosis not present

## 2017-10-01 DIAGNOSIS — Z08 Encounter for follow-up examination after completed treatment for malignant neoplasm: Secondary | ICD-10-CM | POA: Diagnosis not present

## 2017-11-17 DIAGNOSIS — I700 Atherosclerosis of aorta: Secondary | ICD-10-CM | POA: Diagnosis not present

## 2017-11-17 DIAGNOSIS — J449 Chronic obstructive pulmonary disease, unspecified: Secondary | ICD-10-CM | POA: Diagnosis not present

## 2017-11-17 DIAGNOSIS — I480 Paroxysmal atrial fibrillation: Secondary | ICD-10-CM | POA: Diagnosis not present

## 2017-11-26 DIAGNOSIS — R0602 Shortness of breath: Secondary | ICD-10-CM | POA: Diagnosis not present

## 2017-12-22 ENCOUNTER — Other Ambulatory Visit: Payer: PPO

## 2017-12-22 ENCOUNTER — Ambulatory Visit: Payer: PPO | Admitting: Internal Medicine

## 2017-12-22 ENCOUNTER — Encounter: Payer: Self-pay | Admitting: Internal Medicine

## 2017-12-22 VITALS — BP 130/80 | HR 61 | Ht 71.0 in | Wt 200.6 lb

## 2017-12-22 DIAGNOSIS — J479 Bronchiectasis, uncomplicated: Secondary | ICD-10-CM

## 2017-12-22 DIAGNOSIS — J449 Chronic obstructive pulmonary disease, unspecified: Secondary | ICD-10-CM

## 2017-12-22 DIAGNOSIS — R05 Cough: Secondary | ICD-10-CM

## 2017-12-22 DIAGNOSIS — J45991 Cough variant asthma: Secondary | ICD-10-CM | POA: Diagnosis not present

## 2017-12-22 DIAGNOSIS — R059 Cough, unspecified: Secondary | ICD-10-CM

## 2017-12-22 DIAGNOSIS — Z23 Encounter for immunization: Secondary | ICD-10-CM | POA: Diagnosis not present

## 2017-12-22 NOTE — Progress Notes (Signed)
Subjective:    Patient ID: Derrick Moon, male   DOB: [birth year]   MRN: [id number]    Brief patient profile:  49 yowm quit smoking cigarettes 1990 / still smoking some cigars    referred to pulmonary clinic 12/12/2015 by Dr   Willey Blade for sob s/p neg cards eval by Dr Dwana Curd (though ? If pt completed the myocardial perfusion study scheduled for 08/15/15) with COPD GOLD II dx      .History of Present Illness  12/12/2015 1st [facility] office visit/ Marisol Glazer   Chief Complaint  Patient presents with  . Pulmonary Consult    Referred by Dr. [name]. Pt c/o dizziness with exertion for the past 6 months. His spouse states after he has been outside working, he comes in SOB.  He has prod cough with clear sputum in the am's.   variably noisy breathing x 6 months seems to bother wife more than husband  MMRC2 = can't walk a nl pace on a flat grade s sob but does fine slow and flat eg grocery shopping  Sleeping fine off cpap but wakes up every am with throat congestion/ uses halls  spiriva not helping / hfa may help/ prednisone does not help rec avapro  150 mg one daily - if light headed when you stand up break it in half  Stop spiriva and lisinopril and the halls for now GERD diet Only use your albuterol as a rescue medication      12/12/2016  f/u ov/Ashayla Subia re: copd GOLD II/ symb 160 2bid and daily albuterol around 2 pm Chief Complaint  Patient presents with  . Follow-up    Pt states he is here for his annual followup.  He states his breathing is "pretty good".  He states he was seen by his PCP on 12/03/16 and was dxed with lung infection- txed with pred and doxy.  He has some cough with large amounts of clear sputum.  He is using his albuterol inhaler 1 x daily on average.   worse for several months cough/ congestion esp after supper, does ok at hs/noct  But wakes up most  ams with  One tbsp thick mucus while on symbicort rx pred/doxy > 50% improved / still smoking mostly cigars  Much better  breathing  indoors vs outdoors but still mmrc = 2  Needing benadryl to sleep due to sense of pnds   Needing sab at least once daily  rec Plan A = Automatic = symbicort 160 x2 puffs followed by spiriva 2 puffs first thing in am then 12 hours later 2 pffs symbicort only Work on inhaler technique:    Plan B = Backup Only use your albuterol as a rescue medication Please remember to go to the  x-ray department downstairs in the basement  for your tests - we will call you with the results when they are available. The key is to stop smoking completely before smoking completely stops you!       Admit date: 02/02/2017 Discharge date: 02/08/2017   Discharge Diagnoses:  Severe sepsis secondary to respiratory infection Acute on chronic respiratory failure with hypoxemia Atrial fibrillation with RVR, new Acute kidney injury Hypertension BPH Tobacco abuse Abnormal TSH/Hyperthyroidism       02/19/2017   Post hosp ext f/u ov/Christianjames Soule re: transition of care / on 2lpm at hs/ one or two prednisone left in bottle  Chief Complaint  Patient presents with  . Follow-up    Breathing has improved some, but not  back to baseline yet. He states "I feel like I'm suffocating" with exertion and he can not lie down flat.   doe = MMRC2 = can't walk a nl pace on a flat grade s sob but does fine slow and flat eg [employer]  Sleeping in recliner on 3lpm maybe 30 degrees x years No need for saba since d/c but sore throat on both spiriva and bevespi  rec Goal is to keep your 02 sats above 90% at all times so continue 02 2lpm at bedtime and as needed during the day but you need to monitor it when you walk/ exert to be sure your saturations are adequate Pantoprazole (protonix) 40 mg   Take  30-60 min before first meal of the day and Pepcid (famotidine)  20 mg one @  bedtime until return to office - this is the best way to tell whether stomach acid is contributing to your problem.   GERD diet      05/11/2017  f/u  ov/Takumi Din re:  COPD II / still smoking cigars  No meds in hand as req  Chief Complaint  Patient presents with  . Follow-up    Breathing is about the same.  He is coughing with clear sputum.  He is using his albuterol inhaler 2-3 x per wk on average. He stopped using his o2 approx 1 wk ago.   stopped 02 2 weeks prior to OV   Dyspnea:  Able to do yardwork slow pace = MMRC2 = can't walk a nl pace on a flat grade s sob but does fine slow and flat  Cough: sometimes p supper min mucoid Sleep: ok   rec Ok to stop 02  Plan A = Automatic = change bevespi to symbicort 160  Take 2 puffs first thing in am and then another 2 puffs about 12 hours later.  Work on inhaler technique:  relax and gently Plan B = Backup Only use your albuterol as a rescue medication      07/17/2017 acute extended ov/Shayana Hornstein re:  Copd II / symb 160 bid >> says no longer smoking / brought meds as req Chief Complaint  Patient presents with  . Acute Visit    Increased SOB for approx 2 wks- started after he was mowing for several hours.  He states he has had occ wheezing and congestion in his chest. He is coughing up some clear sputum.   prior to 07/07/17 was doing so much better s saba decided to work in the yard for 6 hours but  Noted need for saba while mowing and downhill since then with  cough/ wheezing sob worse in afternoons  And using saba 2 x 3 x daily at most Sleeps 1 pillow, no need for saba noct  rec Prednisone 10 mg take  4 each am x 2 days,   2 each am x 2 days,  1 each am x 2 days and stop  Plan A = Automatic = continue  symbicort 160  Take 2 puffs first thing in am and then another 2 puffs about 12 hours later and be sure you take your pepcid (famotidine) at bedtime  Work on inhaler technique:   Plan B = Backup Only use your albuterol as a rescue medication      08/10/2017  f/u ov/Remingtyn Depaola re:  Copd II/ AB component / dog allergy  Chief Complaint  Patient presents with  . Follow-up    Breathing has improved  slightly. He states he feels  worse when he goes outside in the heat.   Dyspnea: MMRC2 = can't walk a nl pace on a flat grade s sob but does fine slow and flat  Cough: every noct arount 3-4 am cough congestion maybe a tsp of white mucus Typically after supper same problem with cough  Sleep: L side horizontal with dog/ coughs worse if faces dog side  SABA use:  Def helps / using it twice daily if goes outside  / not typically needing noct rec Add singulair (montelukast) 10 mg one daily at bedtime to see if helps night time cough but this is not an antedote to the dog you are sleeping with  Reduce the lopressor (metaprolol) to one half tablet twice daily  Change pepcid to take right after supper to see if helps the cough after supper  Continue symbicort 160(same as dulera 200)  Take 2 puffs first thing in am and then another 2 puffs about 12 hours later.  Work on inhaler technique:  relax and gently blow all the way out then take a nice smooth deep breath back in, triggering the inhaler at same time you start breathing in.  Hold for up to 5 seconds if you can. Blow out thru nose. Rinse and gargle with water when done   Sinus CT 08/19/17  L max sinusitis > augmentin x 20 days > cleared 09/17/17      09/21/2017  f/u ov/Justice Aguirre re:  Copd II/ AB component / dog allergy  Chief Complaint  Patient presents with  . Follow-up    breathing has improved some. He is using his albuterol inhaler 4 x per wk on average.  Dyspnea:  MMRC2 also held back by knees and back  Cough: very little s/p rx for sinusitis  SABA use: rarely needing rec Work on maintaining perfect  inhaler technique:  Only use your albuterol    12/22/2017  Extended  f/u ov/Takina Busser re: GOLD II/III AB/ ? Bronchiectasis/ Sinusitis component refractory cough/ worse sob Chief Complaint  Patient presents with  . Follow-up    Breathing seems worse recently. He gets out of breath walking short distances such as from parking lot to building today.    Dyspnea:  [employer] pushing feels like need to stop and sit down before finish shopping  Cough: congested/ sinus drainage esp in am with variable yellow mucus/ using benadryl to control pnds  Sleeping: bed flat/ one pillow once a week wakes up due to cough  SABA use: up  3 x daily but never rechallenges  02: no     No obvious day to day or daytime variability or assoc  mucus plugs or hemoptysis or cp or chest tightness, subjective wheeze or overt sinus or hb symptoms.   Sleeping most noct as above  without nocturnal  or early am exacerbation  of respiratory  c/o's or need for noct saba. Also denies any obvious fluctuation of symptoms with weather or environmental changes or other aggravating or alleviating factors except as outlined above   No unusual exposure hx or h/o childhood pna/ asthma or knowledge of premature birth.  Current Allergies, Complete Past Medical History, Past Surgical History, Family History, and Social History were reviewed in [employer] record.  ROS  The following are not active complaints unless bolded Hoarseness, sore throat, dysphagia, dental problems, itching, sneezing,  nasal congestion or discharge of excess mucus or purulent secretions, ear ache,   fever, chills, sweats, unintended wt loss or wt gain, classically  pleuritic or exertional cp,  orthopnea pnd or arm/hand swelling  or leg swelling, presyncope, palpitations, abdominal pain, anorexia, nausea, vomiting, diarrhea  or change in bowel habits or change in bladder habits, change in stools or change in urine, dysuria, hematuria,  rash, arthralgias, visual complaints, headache, numbness, weakness or ataxia or problems with walking or coordination,  change in mood or  memory.        Current Meds  Medication Sig  . acetaminophen (TYLENOL) 500 MG tablet Take 1,000 mg every 6 (six) hours as needed by mouth.  [name] albuterol (PROVENTIL HFA;VENTOLIN HFA) 108 (90 BASE) MCG/ACT inhaler Inhale 1-2  puffs into the lungs every 6 (six) hours as needed for wheezing or shortness of breath. Reported on 10/03/2015  . apixaban (ELIQUIS) 5 MG TABS tablet Take 1 tablet (5 mg total) 2 (two) times daily by mouth.  . budesonide-formoterol (SYMBICORT) 160-4.5 MCG/ACT inhaler Inhale 2 puffs into the lungs 2 (two) times daily.  . diphenhydrAMINE (BENADRYL) 25 MG tablet Take 25 mg by mouth every 6 (six) hours as needed.  . famotidine (PEPCID) 20 MG tablet One at bedtime  . guaifenesin (HUMIBID E) 400 MG TABS tablet Take 400 mg by mouth every 4 (four) hours.  . metoprolol tartrate (LOPRESSOR) 25 MG tablet Take 1 tablet (25 mg total) 2 (two) times daily by mouth.  . montelukast (SINGULAIR) 10 MG tablet Take 1 tablet (10 mg total) by mouth at bedtime.  . pantoprazole (PROTONIX) 40 MG tablet Take 1 tablet (40 mg total) by mouth daily.  . Tamsulosin HCl (FLOMAX) 0.4 MG CAPS Take 0.4 mg by mouth daily.                     Objective:   Physical Exam  amb wm with very negative attitude re his care   12/22/2017        200  09/21/2017          206  08/10/2017        202  07/17/2017       198  05/11/2017        198  03/30/2017          194  02/20/2017     189 02/02/2017      189  12/12/2016        201  01/09/2016      198   12/12/15 198 lb (89.8 kg)  11/09/15 201 lb 12.8 oz (91.5 kg)  10/03/15 199 lb (90.3 kg)      Vital signs reviewed - Note on arrival 02 sats  98% on RA      HEENT: nl dentition / oropharynx. Nl external ear canals without cough reflex -  Severe  bilateral non-specific turbinate edema  s secretions   NECK :  without JVD/Nodes/TM/ nl carotid upstrokes bilaterally   LUNGS: no acc muscle use,  Mild barrel  contour chest wall with bilateral  Distant bs with mostly pseudowheeze  and  without cough on insp or exp maneuver and mild  Hyperresonant  to  percussion bilaterally     CV:  RRR  no s3 or murmur or increase in P2, and no edema   ABD:  soft and nontender with pos mid insp  Hoover's  in the supine position. No bruits or organomegaly appreciated, bowel sounds nl  MS:   Nl gait/  ext warm without deformities, calf tenderness, cyanosis or clubbing No obvious joint restrictions   SKIN: warm and dry without  lesions    NEURO:  alert, approp, nl sensorium with  no motor or cerebellar deficits apparent.               Labs ordered 12/22/2017   Alpha one AT         Assessment:

## 2017-12-22 NOTE — Patient Instructions (Addendum)
Pace yourself but continue to be as active as you can   Only use your albuterol as a rescue medication to be used if you can't catch your breath by resting or doing a relaxed purse lip breathing pattern.  - The less you use it, the better it will work when you need it. - Ok to use up to 2 puffs  every 4 hours if you must but call for immediate appointment if use goes up over your usual need - Don't leave home without it !!  (think of it like the spare tire for your car)   Work on inhaler technique:  relax and gently blow all the way out then take a nice smooth deep breath back in, triggering the inhaler at same time you start breathing in.  Hold for up to 5 seconds if you can. Blow symbicort out thru nose. Rinse and gargle with water when done     Please see patient coordinator before you leave today  to schedule sinus CT and I will refer you to a sinus specialist if indicated    Please remember to go to the lab department downstairs in the basement  for your tests - we will call you with the results when they are available.       Please schedule a follow up office visit in 6 weeks, call sooner if needed with pfts on return

## 2017-12-22 NOTE — Assessment & Plan Note (Addendum)
- PFTs  05/27/12   FEV1  2.04(62%) ratio 54  - 12/12/2015 try off spiriva since pt says not working > increased need for saba - Spirometry 01/09/2016  FEV1 1.19 (47%)  Ratio 50 p nothing prior and classic curvature on f/v loop - 01/09/2016  After extensive coaching HFA effectiveness =    75% try symbicort 160 2bid   - 12/12/2016  After extensive coaching devie effectiveness =    75% with smi > add spiriva 2 each am  - Spirometry 01/08/2017  FEV1 1.52 (48%)  Ratio 59    - 01/08/2017 trial of bevespi   - 05/11/2017    Changed  back to symb from bevespi -pt preferred  - Spirometry 08/10/2017  FEV1 1.54 (49%)  Ratio 64     - 12/22/2017  After extensive coaching inhaler device,  effectiveness =    75% (short Ti) - 12/22/2017  Walked RA x 3 laps @ 185 ft each stopped due to  End of study, moderately fast pace, sat 89% but    knees/back gave out > sob  -  Alpha one AT screen 12/22/2017    DDX of  difficult airways management almost all start with A and  include Adherence, Ace Inhibitors, Acid Reflux, Active Sinus Disease, Alpha 1 Antitripsin deficiency, Anxiety masquerading as Airways dz,  ABPA,  Allergy(esp in young), Aspiration (esp in elderly), Adverse effects of meds,  Active smokers, A bunch of PE's (a small clot burden can't cause this syndrome unless there is already severe underlying pulm or vascular dz with poor reserve) plus two Bs  = Bronchiectasis and Beta blocker use..and one C= CHF   Adherence is always the initial "prime suspect" and is a multilayered concern that requires a "trust but verify" approach in every patient - starting with knowing how to use medications, especially inhalers, correctly, keeping up with refills and understanding the fundamental difference between maintenance and prns vs those medications only taken for a very short course and then stopped and not refilled.  - see hfa teaching  Active smoking > rec stop all cigars   ? Active sinus dz > recheck sinus CT and refer to ent  as pt "does not just want to keep taking all these abx"   ? Allergy/ asthma > continue high dose symb/ singulair, work on optimize hfa  ? Alpha one > send level    ? Anxiety/depression/ deconditioning  > usually at the bottom of this list of usual suspects but should be much higher on this pt's based on H and P and note already on psychotropics and may interfere with adherence and also interpretation of response or lack thereof to symptom management which can be quite subjective.   ? Bronchiectasis > see CT chest 02/02/17 > no change rx x needs alpha one check as above and aggressive rx of any sinus dz as above  ? chf > echo reviewed from 02/04/17 ok though Mod LAE noted / no PH    I had an extended discussion with the patient reviewing all relevant studies completed to date and  lasting 25 minutes of a 40  minute office visit addressing  severe non-specific but potentially very serious refractory respiratory symptoms of uncertain and potentially multiple  Etiologies.  See device teaching which extended face to face time for this visit   Cigar smoking also addressed    Each maintenance medication was reviewed in detail including most importantly the difference between maintenance and prns and under what  circumstances the prns are to be triggered using an action plan format that is not reflected in the computer generated alphabetically organized AVS.    Please see AVS for specific instructions unique to this office visit that I personally wrote and verbalized to the the pt in detail and then reviewed with pt  by my nurse highlighting any changes in therapy/plan of care  recommended at today's visit.

## 2017-12-22 NOTE — Assessment & Plan Note (Addendum)
FENO 07/17/2017  =   7 - Allergy profile 07/17/2017 >  Eos 0.1 /  IgE  30 RAST pos cat = dog - added singulair 08/10/2017 and reduced lopressor rx trial basis  - Sinus CT 08/19/2017   >  air-fluid level left maxillary sinus. rec augmentin 875 bid x 20 days then repeat sinus CT > done 09/17/2017 and clear  - worse 12/22/2017 >  Continue singulair// repeat sinus ct and refer to ent prn

## 2017-12-23 ENCOUNTER — Encounter: Payer: Self-pay | Admitting: Internal Medicine

## 2017-12-23 DIAGNOSIS — J479 Bronchiectasis, uncomplicated: Secondary | ICD-10-CM | POA: Insufficient documentation

## 2017-12-23 NOTE — Assessment & Plan Note (Signed)
See CTa chest  Not hrct  02/02/17  Diffuse bronchial wall thickening with scattered areas of more severe thickening of the peribronchovascular interstitium with some mild cylindrical bronchiectasis and extensive regions of mucoid impaction, most evident in the lower lobes of the lungs bilaterally.

## 2017-12-30 ENCOUNTER — Ambulatory Visit (INDEPENDENT_AMBULATORY_CARE_PROVIDER_SITE_OTHER)
Admission: RE | Admit: 2017-12-30 | Discharge: 2017-12-30 | Disposition: A | Discharge status: Home / Self Care | Payer: PPO | Source: Ambulatory Visit | Attending: Internal Medicine | Admitting: Internal Medicine

## 2017-12-30 DIAGNOSIS — R059 Cough, unspecified: Secondary | ICD-10-CM

## 2017-12-30 DIAGNOSIS — J329 Chronic sinusitis, unspecified: Secondary | ICD-10-CM | POA: Diagnosis not present

## 2017-12-30 DIAGNOSIS — J45991 Cough variant asthma: Secondary | ICD-10-CM

## 2017-12-30 DIAGNOSIS — R05 Cough: Secondary | ICD-10-CM | POA: Diagnosis not present

## 2017-12-30 LAB — ALPHA-1 ANTITRYPSIN PHENOTYPE: A-1 Antitrypsin, Ser: 109 mg/dL (ref 83–199)

## 2017-12-30 NOTE — Progress Notes (Signed)
Spoke with pt and notified of results per Dr. Wert. Pt verbalized understanding and denied any questions. 

## 2017-12-31 ENCOUNTER — Inpatient Hospital Stay (HOSPITAL_COMMUNITY)
Admission: EM | Admit: 2017-12-31 | Discharge: 2018-01-04 | DRG: 190 | Disposition: A | Discharge status: Home Health | Payer: PPO | Attending: Internal Medicine | Admitting: Internal Medicine

## 2017-12-31 ENCOUNTER — Ambulatory Visit: Payer: PPO | Admitting: Internal Medicine

## 2017-12-31 ENCOUNTER — Other Ambulatory Visit: Payer: Self-pay

## 2017-12-31 ENCOUNTER — Emergency Department (HOSPITAL_COMMUNITY): Payer: PPO

## 2017-12-31 ENCOUNTER — Encounter (HOSPITAL_COMMUNITY): Payer: Self-pay

## 2017-12-31 ENCOUNTER — Encounter: Payer: Self-pay | Admitting: Internal Medicine

## 2017-12-31 VITALS — BP 110/68 | HR 42 | Ht 71.0 in | Wt 196.0 lb

## 2017-12-31 DIAGNOSIS — Z8551 Personal history of malignant neoplasm of bladder: Secondary | ICD-10-CM | POA: Diagnosis not present

## 2017-12-31 DIAGNOSIS — N400 Enlarged prostate without lower urinary tract symptoms: Secondary | ICD-10-CM | POA: Diagnosis present

## 2017-12-31 DIAGNOSIS — Z7901 Long term (current) use of anticoagulants: Secondary | ICD-10-CM

## 2017-12-31 DIAGNOSIS — I739 Peripheral vascular disease, unspecified: Secondary | ICD-10-CM | POA: Diagnosis not present

## 2017-12-31 DIAGNOSIS — J9600 Acute respiratory failure, unspecified whether with hypoxia or hypercapnia: Secondary | ICD-10-CM

## 2017-12-31 DIAGNOSIS — I10 Essential (primary) hypertension: Secondary | ICD-10-CM | POA: Diagnosis not present

## 2017-12-31 DIAGNOSIS — J449 Chronic obstructive pulmonary disease, unspecified: Secondary | ICD-10-CM | POA: Diagnosis not present

## 2017-12-31 DIAGNOSIS — F17290 Nicotine dependence, other tobacco product, uncomplicated: Secondary | ICD-10-CM | POA: Diagnosis present

## 2017-12-31 DIAGNOSIS — J9620 Acute and chronic respiratory failure, unspecified whether with hypoxia or hypercapnia: Secondary | ICD-10-CM | POA: Diagnosis not present

## 2017-12-31 DIAGNOSIS — J441 Chronic obstructive pulmonary disease with (acute) exacerbation: Principal | ICD-10-CM | POA: Diagnosis present

## 2017-12-31 DIAGNOSIS — J479 Bronchiectasis, uncomplicated: Secondary | ICD-10-CM | POA: Diagnosis not present

## 2017-12-31 DIAGNOSIS — H6693 Otitis media, unspecified, bilateral: Secondary | ICD-10-CM | POA: Diagnosis present

## 2017-12-31 DIAGNOSIS — I714 Abdominal aortic aneurysm, without rupture: Secondary | ICD-10-CM | POA: Diagnosis present

## 2017-12-31 DIAGNOSIS — Z7951 Long term (current) use of inhaled steroids: Secondary | ICD-10-CM | POA: Diagnosis not present

## 2017-12-31 DIAGNOSIS — J204 Acute bronchitis due to parainfluenza virus: Secondary | ICD-10-CM | POA: Diagnosis present

## 2017-12-31 DIAGNOSIS — R0609 Other forms of dyspnea: Secondary | ICD-10-CM

## 2017-12-31 DIAGNOSIS — R0602 Shortness of breath: Secondary | ICD-10-CM | POA: Diagnosis not present

## 2017-12-31 DIAGNOSIS — J9601 Acute respiratory failure with hypoxia: Secondary | ICD-10-CM | POA: Diagnosis present

## 2017-12-31 DIAGNOSIS — Z720 Tobacco use: Secondary | ICD-10-CM | POA: Diagnosis not present

## 2017-12-31 DIAGNOSIS — R6510 Systemic inflammatory response syndrome (SIRS) of non-infectious origin without acute organ dysfunction: Secondary | ICD-10-CM | POA: Diagnosis not present

## 2017-12-31 DIAGNOSIS — H6690 Otitis media, unspecified, unspecified ear: Secondary | ICD-10-CM | POA: Diagnosis not present

## 2017-12-31 DIAGNOSIS — J440 Chronic obstructive pulmonary disease with acute lower respiratory infection: Secondary | ICD-10-CM | POA: Diagnosis present

## 2017-12-31 DIAGNOSIS — G4733 Obstructive sleep apnea (adult) (pediatric): Secondary | ICD-10-CM | POA: Diagnosis not present

## 2017-12-31 DIAGNOSIS — I480 Paroxysmal atrial fibrillation: Secondary | ICD-10-CM | POA: Diagnosis not present

## 2017-12-31 DIAGNOSIS — I4891 Unspecified atrial fibrillation: Secondary | ICD-10-CM | POA: Diagnosis not present

## 2017-12-31 DIAGNOSIS — K219 Gastro-esophageal reflux disease without esophagitis: Secondary | ICD-10-CM | POA: Diagnosis not present

## 2017-12-31 HISTORY — DX: Paroxysmal atrial fibrillation: I48.0

## 2017-12-31 LAB — COMPREHENSIVE METABOLIC PANEL
ALK PHOS: 88 U/L (ref 38–126)
ALT: 52 U/L — AB (ref 0–44)
AST: 52 U/L — ABNORMAL HIGH (ref 15–41)
Albumin: 3.1 g/dL — ABNORMAL LOW (ref 3.5–5.0)
Anion gap: 8 (ref 5–15)
BILIRUBIN TOTAL: 0.7 mg/dL (ref 0.3–1.2)
BUN: 10 mg/dL (ref 8–23)
CO2: 27 mmol/L (ref 22–32)
CREATININE: 1.12 mg/dL (ref 0.61–1.24)
Calcium: 9.2 mg/dL (ref 8.9–10.3)
Chloride: 104 mmol/L (ref 98–111)
Glucose, Bld: 113 mg/dL — ABNORMAL HIGH (ref 70–99)
Potassium: 3.7 mmol/L (ref 3.5–5.1)
Sodium: 139 mmol/L (ref 135–145)
TOTAL PROTEIN: 6.4 g/dL — AB (ref 6.5–8.1)

## 2017-12-31 LAB — PROCALCITONIN: Procalcitonin: 0.16 ng/mL

## 2017-12-31 LAB — CBC
HCT: 51 % (ref 39.0–52.0)
Hemoglobin: 15.4 g/dL (ref 13.0–17.0)
MCH: 27.5 pg (ref 26.0–34.0)
MCHC: 30.2 g/dL (ref 30.0–36.0)
MCV: 91.1 fL (ref 80.0–100.0)
PLATELETS: 233 10*3/uL (ref 150–400)
RBC: 5.6 MIL/uL (ref 4.22–5.81)
RDW: 14.6 % (ref 11.5–15.5)
WBC: 15.1 10*3/uL — ABNORMAL HIGH (ref 4.0–10.5)
nRBC: 0 % (ref 0.0–0.2)

## 2017-12-31 LAB — I-STAT CG4 LACTIC ACID, ED
LACTIC ACID, VENOUS: 1.56 mmol/L (ref 0.5–1.9)
LACTIC ACID, VENOUS: 0.94 mmol/L (ref 0.5–1.9)

## 2017-12-31 LAB — BASIC METABOLIC PANEL
ANION GAP: 7 (ref 5–15)
BUN: 11 mg/dL (ref 8–23)
CALCIUM: 9.5 mg/dL (ref 8.9–10.3)
CO2: 28 mmol/L (ref 22–32)
Chloride: 106 mmol/L (ref 98–111)
Creatinine, Ser: 1.14 mg/dL (ref 0.61–1.24)
GLUCOSE: 109 mg/dL — AB (ref 70–99)
Potassium: 3.8 mmol/L (ref 3.5–5.1)
Sodium: 141 mmol/L (ref 135–145)

## 2017-12-31 LAB — I-STAT TROPONIN, ED: Troponin i, poc: 0.03 ng/mL (ref 0.00–0.08)

## 2017-12-31 LAB — BRAIN NATRIURETIC PEPTIDE: B Natriuretic Peptide: 152 pg/mL — ABNORMAL HIGH (ref 0.0–100.0)

## 2017-12-31 LAB — TSH: TSH: 0.203 u[IU]/mL — AB (ref 0.350–4.500)

## 2017-12-31 MED ORDER — ACETAMINOPHEN 325 MG PO TABS
650.0000 mg | ORAL_TABLET | Freq: Four times a day (QID) | ORAL | Status: DC | PRN
  Administered 2018-01-03: 650 mg via ORAL
  Filled 2017-12-31: qty 2

## 2017-12-31 MED ORDER — SODIUM CHLORIDE 0.9 % IV SOLN
500.0000 mg | INTRAVENOUS | Status: DC
  Administered 2017-12-31 – 2018-01-01 (×2): 500 mg via INTRAVENOUS
  Filled 2017-12-31 (×2): qty 500

## 2017-12-31 MED ORDER — TAMSULOSIN HCL 0.4 MG PO CAPS
0.4000 mg | ORAL_CAPSULE | Freq: Every day | ORAL | Status: DC
  Administered 2018-01-01 – 2018-01-03 (×3): 0.4 mg via ORAL
  Filled 2017-12-31 (×3): qty 1

## 2017-12-31 MED ORDER — IPRATROPIUM-ALBUTEROL 0.5-2.5 (3) MG/3ML IN SOLN
3.0000 mL | Freq: Once | RESPIRATORY_TRACT | Status: AC
  Administered 2017-12-31: 3 mL via RESPIRATORY_TRACT
  Filled 2017-12-31: qty 3

## 2017-12-31 MED ORDER — ALBUTEROL SULFATE (2.5 MG/3ML) 0.083% IN NEBU: 2.5000 mg | INHALATION_SOLUTION | RESPIRATORY_TRACT | Status: DC | PRN

## 2017-12-31 MED ORDER — IPRATROPIUM-ALBUTEROL 0.5-2.5 (3) MG/3ML IN SOLN
3.0000 mL | Freq: Four times a day (QID) | RESPIRATORY_TRACT | Status: DC
  Administered 2018-01-01 – 2018-01-02 (×5): 3 mL via RESPIRATORY_TRACT
  Filled 2017-12-31 (×6): qty 3

## 2017-12-31 MED ORDER — SODIUM CHLORIDE 0.9 % IV SOLN
1.0000 g | Freq: Three times a day (TID) | INTRAVENOUS | Status: DC
  Administered 2018-01-01: 1 g via INTRAVENOUS
  Filled 2017-12-31 (×4): qty 1

## 2017-12-31 MED ORDER — METHYLPREDNISOLONE SODIUM SUCC 125 MG IJ SOLR
60.0000 mg | Freq: Three times a day (TID) | INTRAMUSCULAR | Status: DC
  Administered 2018-01-01 – 2018-01-03 (×7): 60 mg via INTRAVENOUS
  Filled 2017-12-31 (×7): qty 2

## 2017-12-31 MED ORDER — ARFORMOTEROL TARTRATE 15 MCG/2ML IN NEBU
15.0000 ug | INHALATION_SOLUTION | Freq: Two times a day (BID) | RESPIRATORY_TRACT | Status: DC
  Administered 2017-12-31 – 2018-01-04 (×7): 15 ug via RESPIRATORY_TRACT
  Filled 2017-12-31 (×8): qty 2

## 2017-12-31 MED ORDER — SODIUM CHLORIDE 0.9 % IV SOLN: INTRAVENOUS | Status: DC

## 2017-12-31 MED ORDER — ONDANSETRON HCL 4 MG PO TABS: 4.0000 mg | ORAL_TABLET | Freq: Four times a day (QID) | ORAL | Status: DC | PRN

## 2017-12-31 MED ORDER — APIXABAN 5 MG PO TABS
5.0000 mg | ORAL_TABLET | Freq: Two times a day (BID) | ORAL | Status: DC
  Administered 2018-01-01 – 2018-01-04 (×7): 5 mg via ORAL
  Filled 2017-12-31 (×8): qty 1

## 2017-12-31 MED ORDER — MONTELUKAST SODIUM 10 MG PO TABS
10.0000 mg | ORAL_TABLET | Freq: Every day | ORAL | Status: DC
  Administered 2018-01-01 – 2018-01-03 (×3): 10 mg via ORAL
  Filled 2017-12-31 (×3): qty 1

## 2017-12-31 MED ORDER — PANTOPRAZOLE SODIUM 40 MG PO TBEC
40.0000 mg | DELAYED_RELEASE_TABLET | Freq: Every day | ORAL | Status: DC
  Administered 2018-01-01 – 2018-01-04 (×4): 40 mg via ORAL
  Filled 2017-12-31 (×6): qty 1

## 2017-12-31 MED ORDER — SODIUM CHLORIDE 0.9 % IV SOLN
Freq: Once | INTRAVENOUS | Status: AC
  Administered 2018-01-01: 01:00:00 via INTRAVENOUS

## 2017-12-31 MED ORDER — SODIUM CHLORIDE 0.9 % IV SOLN
2.0000 g | Freq: Once | INTRAVENOUS | Status: AC
  Administered 2017-12-31: 2 g via INTRAVENOUS
  Filled 2017-12-31: qty 2

## 2017-12-31 MED ORDER — FAMOTIDINE 20 MG PO TABS: 20.0000 mg | ORAL_TABLET | Freq: Every day | ORAL | Status: DC

## 2017-12-31 MED ORDER — GUAIFENESIN ER 600 MG PO TB12
600.0000 mg | ORAL_TABLET | Freq: Two times a day (BID) | ORAL | Status: DC
  Administered 2018-01-01 – 2018-01-04 (×7): 600 mg via ORAL
  Filled 2017-12-31 (×8): qty 1

## 2017-12-31 MED ORDER — METOPROLOL TARTRATE 25 MG PO TABS
25.0000 mg | ORAL_TABLET | Freq: Two times a day (BID) | ORAL | Status: DC
  Filled 2017-12-31: qty 1

## 2017-12-31 MED ORDER — DIPHENHYDRAMINE HCL 25 MG PO CAPS
25.0000 mg | ORAL_CAPSULE | Freq: Four times a day (QID) | ORAL | Status: DC | PRN
  Administered 2018-01-03: 25 mg via ORAL
  Filled 2017-12-31: qty 1

## 2017-12-31 MED ORDER — FAMOTIDINE 20 MG PO TABS
20.0000 mg | ORAL_TABLET | Freq: Every day | ORAL | Status: DC
  Administered 2018-01-01 – 2018-01-03 (×3): 20 mg via ORAL
  Filled 2017-12-31 (×3): qty 1

## 2017-12-31 MED ORDER — FAMOTIDINE IN NACL 20-0.9 MG/50ML-% IV SOLN: 20.0000 mg | Freq: Once | INTRAVENOUS | Status: DC

## 2017-12-31 MED ORDER — ACETAMINOPHEN 650 MG RE SUPP: 650.0000 mg | Freq: Four times a day (QID) | RECTAL | Status: DC | PRN

## 2017-12-31 MED ORDER — BUDESONIDE 0.5 MG/2ML IN SUSP
0.5000 mg | Freq: Two times a day (BID) | RESPIRATORY_TRACT | Status: DC
  Administered 2017-12-31 – 2018-01-04 (×7): 0.5 mg via RESPIRATORY_TRACT
  Filled 2017-12-31 (×8): qty 2

## 2017-12-31 MED ORDER — ONDANSETRON HCL 4 MG/2ML IJ SOLN: 4.0000 mg | Freq: Four times a day (QID) | INTRAMUSCULAR | Status: DC | PRN

## 2017-12-31 MED ORDER — METHYLPREDNISOLONE SODIUM SUCC 125 MG IJ SOLR
125.0000 mg | Freq: Once | INTRAMUSCULAR | Status: AC
  Administered 2017-12-31: 125 mg via INTRAVENOUS
  Filled 2017-12-31: qty 2

## 2017-12-31 NOTE — Progress Notes (Addendum)
Pt requested to be taken  Off Bipap.  Pt placed on 4L [place], Spo2 is 94%.  Pt looks comfortable,. NAD noted. Will continue to monitor.

## 2017-12-31 NOTE — H&P (Addendum)
History and Physical    Derrick Moon ERX:[id number] DOB: 08-26-1940 DOA: 12/31/2017  Referring MD/NP/PA: Fabian November, MD(Resident) PCP: [name], MD  Patient coming from:   Chief Complaint: Shortness of breath  I have personally briefly reviewed patient's old medical records in [place]   HPI: Derrick Moon is a 77 y.o. male with medical history significant of PAF on Eliquis s/p TEE/DCCVfollowed by Dr. Jacinta Shoe, AAA, COPD Gold II, HTN, Bladder Ca s/p resection, tobacco use, and BPH; who presents with complaints of progressively worsening shortness of breath over the last 3 to 4 days.  Symptoms started with complaints of nasal congestion postnasal drip and cough with large amounts of clear thick sputum.  Associated symptoms included complaints of headache, bilateral ear pain, wheezing, and chills.  He has been using his Symbicort and albuterol inhaler with temporary relief of symptoms.  He became increasingly more winded even walking a few feet.  At home his O2 saturations have been in the 80s for most of the week.  He is not normally on oxygen at home. Denies having any chest pain, nausea, vomiting, diarrhea, dysuria, leg swelling, calf pain, or known recent sick contacts.  Patient had just had a limited CT of the paranasal sinuses without contrast yesterday that revealed clear paranasal sinuses.  He had followed up at Dr. Melvyn Novas office of pulmonology today after being referred from Dr. Dwana Curd and was noted to be in atrial fibrillation and was sent to the hospital for further evaluation.  Patient apparently quit smoking cigarettes in 1990, but still smokes cigars.  He previously was admitted into the hospital in 01/2017, after being found to be in A. fib with RVR secondary to atypical pneumonia and ultimately had to be sent home on oxygen although cultures did not grow out specific organism.  ED Course: Upon admission to the emergency department patient was noted to be afebrile,  pulse 42-88, respirations 15-27, blood pressures maintained, and O2 saturations noted as low as 84% initially on room air.  Patient was placed on BiPAP due to respiratory distress maintaining O2 sats at this time.  No initial ABG was obtained.  Labs revealed WBC 15.1, creatinine 1.12, AST 52, ALT 52, albumin 3.1, and lactic acid 1.56.  Chest x-ray was otherwise noted to be clear.  Sepsis protocol was initiated without fluid bolus.  In the emergency department patient was given DuoNeb breathing treatment, 125 mg of Solu-Medrol, cefepime, and azithromycin.  TRH called to admit for COPD exacerbation.  Review of Systems  Constitutional: Positive for chills and malaise/fatigue. Negative for fever.  HENT: Positive for congestion and ear pain.   Eyes: Negative for photophobia and pain.  Respiratory: Positive for cough, sputum production, shortness of breath and wheezing.   Cardiovascular: Negative for chest pain and leg swelling.  Gastrointestinal: Negative for abdominal pain, diarrhea, nausea and vomiting.  Genitourinary: Negative for dysuria and frequency.  Musculoskeletal: Negative for falls.  Skin: Negative for itching and rash.  Neurological: Positive for headaches. Negative for focal weakness and loss of consciousness.  Psychiatric/Behavioral: Negative for memory loss. The patient has insomnia.     Past Medical History:  Diagnosis Date  . AAA (abdominal aortic aneurysm) ([place])   . Anxiety    due to pain in leg  . Arthritis    Osteo  . Bell's palsy   . Cancer [facility])    Bladder cancer  . Cataracts, bilateral   . COPD (chronic obstructive pulmonary disease) ([facility])    Albuterol  inhaler as needed  . Diverticulitis   . Enlarged prostate    takes Flomax daily  . GERD (gastroesophageal reflux disease)    was on Prilosec but has been off for yrs-no issues now with reflux  . Headache    migraines years ago  . History of bronchitis    > 72yrs ago  . History of colon polyps    benign  .  History of dizziness    several yrs ago-was on Meclizine but has been off for several yrs-no problems since  . History of hiatal hernia   . Hypertension    takes Amlodipine and Lisinpril daily  . Joint pain   . Joint swelling   . Nocturia   . PAD (peripheral artery disease) ([facility])     bilateral superficial femoral artery occlusions   . Pneumonia    several times with most recent within 5 yrs ago  . Restless leg syndrome   . Rupture of left quadriceps tendon   . Shortness of breath dyspnea    with exertion  . Sleep apnea    does not use cpap  . Urinary urgency     Past Surgical History:  Procedure Laterality Date  . BICEPS TENDON REPAIR     LEFT  . CARDIOVERSION N/A 02/06/2017   Procedure: CARDIOVERSION;  Surgeon: Jerline Pain, MD;  Location[facility] ENDOSCOPY;  Service: Cardiovascular;  Laterality: N/A;  . CARPAL TUNNEL RELEASE Right 2014  . CHOLECYSTECTOMY  July 16,2010  . COLONOSCOPY    . CYSTOSCOPY    . ESOPHAGOGASTRODUODENOSCOPY    . FEMORAL-POPLITEAL BYPASS GRAFT Right 09/19/2015   Procedure: RIGHT COMMON FEMORAL-ABOVE KNEE POPLITEAL ARTERY    BYPASS GRAFT ;  Surgeon: Conrad Clarissa, MD;  Location: [place];  Service: Vascular;  Laterality: Right;  . FOOT SURGERY Right    x 5t  . HAND SURGERY Right   . HERNIA REPAIR Bilateral    inguinal  . PERIPHERAL VASCULAR CATHETERIZATION N/A 08/02/2015   Procedure: Abdominal Aortogram w/Lower Extremity;  Surgeon: Conrad Vera Cruz, MD;  Location: [place] [facility];  Service: Cardiovascular;  Laterality: N/A;  . QUADRICEPS TENDON REPAIR Left 08/22/2016   Procedure: LEFT OPEN QUADRICEP TENDON REPAIR;  Surgeon: Netta Cedars, MD;  Location: [place];  Service: Orthopedics;  Laterality: Left;  . REVERSE SHOULDER ARTHROPLASTY Right 07/07/2014   Procedure: RIGHT REVERSE TOTAL SHOULDER ;  Surgeon: Netta Cedars, MD;  Location: [place];  Service: Orthopedics;  Laterality: Right;  . REVERSE TOTAL SHOULDER ARTHROPLASTY Right 07/07/2014   Dr Veverly Fells  . ROTATOR  CUFF REPAIR     Bilateral   . TEE WITHOUT CARDIOVERSION N/A 02/06/2017   Procedure: TRANSESOPHAGEAL ECHOCARDIOGRAM (TEE);  Surgeon: Jerline Pain, MD;  Location: [facility] ENDOSCOPY;  Service: Cardiovascular;  Laterality: N/A;  . TONSILLECTOMY    . tumor removed from bladder    . VEIN HARVEST Right 09/19/2015   Procedure: WITH NON REVERSE RIGHT GREATER SAPHENOUS VEIN HARVEST;  Surgeon: Conrad Berryville, MD;  Location: [place];  Service: Vascular;  Laterality: Right;     reports that he has been smoking cigars. He has a 45.00 pack-year smoking history. He has never used smokeless tobacco. He reports that he drinks alcohol. He reports that he has current or past drug history. Drug: Marijuana.  Allergies  Allergen Reactions  . Adhesive [Tape] Other (See Comments)    SKIN IS VERY THIN AND IT TEARS VERY EASILY!!! Please use an alternative  . Aspartame And Phenylalanine Nausea  Only  . Other Rash    Pt reports rash on back, buttocks, shoulders and stomach after arteriogram  . Sulfonamide Derivatives Nausea And Vomiting    Family History  Problem Relation Age of Onset  . Parkinson's disease Mother   . AAA (abdominal aortic aneurysm) Father   . Heart defect Sister        Congenital Heart Disease  . Heart disease Sister     Prior to Admission medications   Medication Sig Start Date End Date Taking? Authorizing Provider  acetaminophen (TYLENOL) 500 MG tablet Take 1,000 mg by mouth every 6 (six) hours as needed (for pain or headaches).    Yes [provider]  albuterol (PROVENTIL HFA;VENTOLIN HFA) 108 (90 BASE) MCG/ACT inhaler Inhale 1-2 puffs into the lungs every 6 (six) hours as needed for wheezing or shortness of breath. Reported on 10/03/2015   Yes [provider]  apixaban (ELIQUIS) 5 MG TABS tablet Take 1 tablet (5 mg total) 2 (two) times daily by mouth. 02/08/17  Yes Mikhail, Velta Addison, DO  budesonide-formoterol (SYMBICORT) 160-4.5 MCG/ACT inhaler Inhale 2 puffs into the lungs 2  (two) times daily. 05/11/17  Yes Tanda Rockers, MD  diphenhydrAMINE (BENADRYL) 25 MG tablet Take 25 mg by mouth every 6 (six) hours as needed for itching or allergies.    Yes [provider]  famotidine (PEPCID) 20 MG tablet One at bedtime Patient taking differently: Take 20 mg by mouth at bedtime.  05/11/17  Yes Tanda Rockers, MD  metoprolol tartrate (LOPRESSOR) 25 MG tablet Take 1 tablet (25 mg total) 2 (two) times daily by mouth. 02/08/17  Yes Mikhail, Maryann, DO  montelukast (SINGULAIR) 10 MG tablet Take 1 tablet (10 mg total) by mouth at bedtime. 08/10/17  Yes Tanda Rockers, MD  pantoprazole (PROTONIX) 40 MG tablet Take 1 tablet (40 mg total) by mouth daily. Patient taking differently: Take 40 mg by mouth daily before breakfast.  05/11/17  Yes Tanda Rockers, MD  Tamsulosin HCl (FLOMAX) 0.4 MG CAPS Take 0.4 mg by mouth daily after supper.    Yes [provider]  guaifenesin (HUMIBID E) 400 MG TABS tablet Take 400 mg by mouth every 4 (four) hours.    [provider]    Physical Exam:  Constitutional: Elderly male appears to be in some respiratory distress, but able to follow commands Vitals:   12/31/17 1815 12/31/17 1830 12/31/17 1845 12/31/17 1900  BP: 133/69 132/71 (!) 143/74 138/70  Pulse: 70 72 68 68  Resp: 18 (!) 26 20 (!) 26  Temp:      TempSrc:      SpO2: 93% 94% 92% 95%  Weight:      Height:       Eyes: PERRL, lids and conjunctivae normal ENMT: Mucous membranes are moist.  Erythematous ear canals with bulging tympanic membranes bilaterally. Neck: normal, supple, no masses, no thyromegaly Respiratory: Patient currently on BiPAP, but able to talk in short sentences.  Bilateral expiratory wheezes heard throughout both lung fields. Cardiovascular: Regular rate and rhythm, no murmurs / rubs / gallops. No extremity edema. 2+ pedal pulses. No carotid bruits.  Abdomen: no tenderness, no masses palpated. No hepatosplenomegaly. Bowel sounds positive.    Musculoskeletal: no clubbing / cyanosis. No joint deformity upper and lower extremities. Good ROM, no contractures. Normal muscle tone.  Skin: no rashes, lesions, ulcers. No induration Neurologic: CN 2-12 grossly intact. Sensation intact, DTR normal. Strength 5/5 in all 4.  Psychiatric: Normal judgment and  insight. Alert and oriented x 3. Normal mood.     Labs on Admission: I have personally reviewed following labs and imaging studies  CBC: Recent Labs  Lab 12/31/17 1512  WBC 15.1*  HGB 15.4  HCT 51.0  MCV 91.1  PLT 397   Basic Metabolic Panel: Recent Labs  Lab 12/31/17 1512 12/31/17 1700  NA 141 139  K 3.8 3.7  CL 106 104  CO2 28 27  GLUCOSE 109* 113*  BUN 11 10  CREATININE 1.14 1.12  CALCIUM 9.5 9.2   GFR: Estimated Creatinine Clearance: 58.8 mL/min (by C-G formula based on SCr of 1.12 mg/dL). Liver Function Tests: Recent Labs  Lab 12/31/17 1700  AST 52*  ALT 52*  ALKPHOS 88  BILITOT 0.7  PROT 6.4*  ALBUMIN 3.1*   No results for input(s): LIPASE, AMYLASE in the last 168 hours. No results for input(s): AMMONIA in the last 168 hours. Coagulation Profile: No results for input(s): INR, PROTIME in the last 168 hours. Cardiac Enzymes: No results for input(s): CKTOTAL, CKMB, CKMBINDEX, TROPONINI in the last 168 hours. BNP (last 3 results) No results for input(s): PROBNP in the last 8760 hours. HbA1C: No results for input(s): HGBA1C in the last 72 hours. CBG: No results for input(s): GLUCAP in the last 168 hours. Lipid Profile: No results for input(s): CHOL, HDL, LDLCALC, TRIG, CHOLHDL, LDLDIRECT in the last 72 hours. Thyroid Function Tests: No results for input(s): TSH, T4TOTAL, FREET4, T3FREE, THYROIDAB in the last 72 hours. Anemia Panel: No results for input(s): VITAMINB12, FOLATE, FERRITIN, TIBC, IRON, RETICCTPCT in the last 72 hours. Urine analysis:    Component Value Date/Time   COLORURINE YELLOW 09/14/2015 [place] 09/14/2015  0934   LABSPEC 1.028 09/14/2015 0934   PHURINE 5.0 09/14/2015 0934   GLUCOSEU NEGATIVE 09/14/2015 0934   HGBUR NEGATIVE 09/14/2015 0934   BILIRUBINUR NEGATIVE 09/14/2015 0934   KETONESUR NEGATIVE 09/14/2015 0934   PROTEINUR NEGATIVE 09/14/2015 0934   UROBILINOGEN 0.2 10/06/2008 0800   NITRITE NEGATIVE 09/14/2015 0934   LEUKOCYTESUR NEGATIVE 09/14/2015 0934   Sepsis Labs: No results found for this or any previous visit (from the past 240 hour(s)).   Radiological Exams on Admission: Dg Chest 2 View  Result Date: 12/31/2017 CLINICAL DATA:  Shortness of breath, AFib EXAM: CHEST - 2 VIEW COMPARISON:  08/10/2017 FINDINGS: There is bilateral chronic interstitial thickening. The lungs are hyperinflated likely secondary to COPD. There is no focal consolidation. There is no pleural effusion or pneumothorax. The heart and mediastinal contours are unremarkable. There is no acute osseous abnormality. IMPRESSION: No active cardiopulmonary disease. Electronically Signed   By: Kathreen Devoid   On: 12/31/2017 15:28   [place] Cm  Result Date: 12/31/2017 CLINICAL DATA:  Recurrent sinusitis EXAM: CT PARANASAL SINUS LIMITED WITHOUT CONTRAST TECHNIQUE: Non-contiguous multidetector CT images of the paranasal sinuses were obtained in a single plane without contrast. COMPARISON:  CT sinus 09/17/2017 FINDINGS: The paranasal sinuses are free of fluid.  No mucosal thickening. IMPRESSION: Clear paranasal sinuses. Electronically Signed   By: Ulyses Jarred M.D.   On: 12/31/2017 05:01    EKG: Independently reviewed.  Sinus rhythm at 81 bpm with minimal ST depressions noted in the inferior leads  Assessment/Plan Respiratory failure with hypoxia, COPD exacerbation: Acute on chronic.  Patient presents with planes of progressively worsening shortness of breath with cough.  He reports O2 saturations in the 80s at home.  Patient not on oxygen at baseline.  Chest x-ray  otherwise noted to be clear.  Due to  respiratory distress patient had been placed on BiPAP with improvement of respirations.  Patient was seen by Dr. Melvyn Novas of pulmonology today.  He received 125 mg of Solu-Medrol IV in the ED and DuoNeb breathing treatment.  - Admit to telemetry bed - Focus COPD order set initiated - Continuous pulse oximetry with nasal cannula oxygen to maintain O2 saturation  - Duonebs QID  - Budesonide and Brovana nebs - Solu-Medrol 60 mg IV every 8 hours - Continue Singulair  - Mucinex - Continue outpatient follow-up with Dr. Melvyn Novas once discharge  SIRS: Acute.  Patient was noted to be tachypneic with elevated white blood cell count of 15.1.  Lactic acid was found to be reassuring at 1.56.  Patient complains of ear pain found to have erythematous auditory canals with bulging turbinates.  Chest x-ray was otherwise noted to be clear, but patient reportedly had similar presentation with pneumonia approximately 1 year ago.  Blood cultures were obtained and the patient has been started on empiric antibiotics of cefepime and azithromycin. - Follow-up blood and sputum studies - Follow-up procalcitonin and respiratory virus panel - Continue empiric antibiotics of cefepime and azithromycin  Otitis media: Acute.  As seen above patient with signs of otitis media. - Continue antibiotics as noted above. - De-escalate when medically appropriate  Paroxysmal atrial fibrillation on chronic anticoagulation: Patient appears to be currently in sinus rhythm.  Previous history of atrial fibrillation past for which she was placed on Eliquis.  Patient followed by Dr. Eulis Manly outpatient setting. - Continue Eliquis - Continue outpatient follow-up with Dr. Jacinta Shoe  Transaminitis: Chronic.  Patient with mild elevation in AST and ALT at 52.  Levels appears similar to hospitalization in 01/2017. - May consider recommending rechecking LFTs in the outpatient setting once discharged  Essential hypertension - Continue  metoprolol  BPH - Continue Flomax  GERD - Pepcid IV x1 dose now - Continue home regimen of tonics and Pepcid tomorrow if off BiPAP  Tobacco use: Patient previously quit smoking cigarettes in 1990, but continues to smoke cigars. - Counsel need of cessation of cigarettes  DVT prophylaxis: Eliquis Code Status: Full Family Communication: No family present at bedside Disposition Plan: To be discharged home in 2 to 3 days Consults called: none  Admission status: inpatient  Norval Morton MD [facility] Pager [phone]   If 7PM-7AM, please contact night-coverage [email or web address] Password [email or web address]  12/31/2017, 8:08 PM

## 2017-12-31 NOTE — Progress Notes (Signed)
Pharmacy Antibiotic Note  Derrick Moon is a 77 y.o. male admitted on 12/31/2017 with sepsis.  Pharmacy has been consulted for Cefepime dosing. WBC - 15.1, afebrile, lactic acid WNL  Plan: -Cefepime 2g x1, then cefepime 1g q8h -Azithromycin per MD -Monitor and adjust abx pending C&S  Height: 5\' 11"  (180.3 cm) Weight: 195 lb 15.8 oz (88.9 kg) IBW/kg (Calculated) : 75.3  Temp (24hrs), Avg:97.6 F (36.4 C), Min:97.6 F (36.4 C), Max:97.6 F (36.4 C)  Recent Labs  Lab 12/31/17 1512  WBC 15.1*  CREATININE 1.14    Estimated Creatinine Clearance: 57.8 mL/min (by C-G formula based on SCr of 1.14 mg/dL).    Allergies  Allergen Reactions  . Aspartame And Phenylalanine Nausea Only  . Other Rash    Pt reports rash on back, buttocks, shoulders and stomach after arteriogram  . Sulfonamide Derivatives Nausea And Vomiting    Antimicrobials this admission: Cefepime 10/10 >>  Azithromycin 10/10 >>   Dose adjustments this admission: N/A  Microbiology results: Pending  Thank you for allowing pharmacy to be a part of this patient's care.  Tyson Babinski 12/31/2017 5:10 PM

## 2017-12-31 NOTE — Care Management (Signed)
EDCM reviewed CM consult. Patient is being admitted. Unit CM to follow for discharge needs. Zakk Borgen RN CCM  

## 2017-12-31 NOTE — Progress Notes (Signed)
Subjective:    Patient ID: ARLIE RIKER, male   DOB: [birth year]   MRN: [id number]    Brief patient profile:  49 yowm quit smoking cigarettes 1990 / still smoking some cigars    referred to pulmonary clinic 12/12/2015 by Dr   Willey Blade for sob s/p neg cards eval by Dr Dwana Curd (though ? If pt completed the myocardial perfusion study scheduled for 08/15/15) with COPD GOLD II dx      .History of Present Illness  12/12/2015 1st [facility] office visit/ Maevyn Riordan   Chief Complaint  Patient presents with  . Pulmonary Consult    Referred by Dr. [name]. Pt c/o dizziness with exertion for the past 6 months. His spouse states after he has been outside working, he comes in SOB.  He has prod cough with clear sputum in the am's.   variably noisy breathing x 6 months seems to bother wife more than husband  MMRC2 = can't walk a nl pace on a flat grade s sob but does fine slow and flat eg grocery shopping  Sleeping fine off cpap but wakes up every am with throat congestion/ uses halls  spiriva not helping / hfa may help/ prednisone does not help rec avapro  150 mg one daily - if light headed when you stand up break it in half  Stop spiriva and lisinopril and the halls for now GERD diet Only use your albuterol as a rescue medication      12/12/2016  f/u ov/Keymiah Lyles re: copd GOLD II/ symb 160 2bid and daily albuterol around 2 pm Chief Complaint  Patient presents with  . Follow-up    Pt states he is here for his annual followup.  He states his breathing is "pretty good".  He states he was seen by his PCP on 12/03/16 and was dxed with lung infection- txed with pred and doxy.  He has some cough with large amounts of clear sputum.  He is using his albuterol inhaler 1 x daily on average.   worse for several months cough/ congestion esp after supper, does ok at hs/noct  But wakes up most  ams with  One tbsp thick mucus while on symbicort rx pred/doxy > 50% improved / still smoking mostly cigars  Much better  breathing  indoors vs outdoors but still mmrc = 2  Needing benadryl to sleep due to sense of pnds   Needing sab at least once daily  rec Plan A = Automatic = symbicort 160 x2 puffs followed by spiriva 2 puffs first thing in am then 12 hours later 2 pffs symbicort only Work on inhaler technique:    Plan B = Backup Only use your albuterol as a rescue medication Please remember to go to the  x-ray department downstairs in the basement  for your tests - we will call you with the results when they are available. The key is to stop smoking completely before smoking completely stops you!       Admit date: 02/02/2017 Discharge date: 02/08/2017   Discharge Diagnoses:  Severe sepsis secondary to respiratory infection Acute on chronic respiratory failure with hypoxemia Atrial fibrillation with RVR, new Acute kidney injury Hypertension BPH Tobacco abuse Abnormal TSH/Hyperthyroidism       02/19/2017   Post hosp ext f/u ov/Donaciano Range re: transition of care / on 2lpm at hs/ one or two prednisone left in bottle  Chief Complaint  Patient presents with  . Follow-up    Breathing has improved some, but not  back to baseline yet. He states "I feel like I'm suffocating" with exertion and he can not lie down flat.   doe = MMRC2 = can't walk a nl pace on a flat grade s sob but does fine slow and flat eg [employer]  Sleeping in recliner on 3lpm maybe 30 degrees x years No need for saba since d/c but sore throat on both spiriva and bevespi  rec Goal is to keep your 02 sats above 90% at all times so continue 02 2lpm at bedtime and as needed during the day but you need to monitor it when you walk/ exert to be sure your saturations are adequate Pantoprazole (protonix) 40 mg   Take  30-60 min before first meal of the day and Pepcid (famotidine)  20 mg one @  bedtime until return to office - this is the best way to tell whether stomach acid is contributing to your problem.   GERD diet      05/11/2017  f/u  ov/Jenny Lai re:  COPD II / still smoking cigars  No meds in hand as req  Chief Complaint  Patient presents with  . Follow-up    Breathing is about the same.  He is coughing with clear sputum.  He is using his albuterol inhaler 2-3 x per wk on average. He stopped using his o2 approx 1 wk ago.   stopped 02 2 weeks prior to OV   Dyspnea:  Able to do yardwork slow pace = MMRC2 = can't walk a nl pace on a flat grade s sob but does fine slow and flat  Cough: sometimes p supper min mucoid Sleep: ok   rec Ok to stop 02  Plan A = Automatic = change bevespi to symbicort 160  Take 2 puffs first thing in am and then another 2 puffs about 12 hours later.  Work on inhaler technique:  relax and gently Plan B = Backup Only use your albuterol as a rescue medication      07/17/2017 acute extended ov/Nirvaan Frett re:  Copd II / symb 160 bid >> says no longer smoking / brought meds as req Chief Complaint  Patient presents with  . Acute Visit    Increased SOB for approx 2 wks- started after he was mowing for several hours.  He states he has had occ wheezing and congestion in his chest. He is coughing up some clear sputum.   prior to 07/07/17 was doing so much better s saba decided to work in the yard for 6 hours but  Noted need for saba while mowing and downhill since then with  cough/ wheezing sob worse in afternoons  And using saba 2 x 3 x daily at most Sleeps 1 pillow, no need for saba noct  rec Prednisone 10 mg take  4 each am x 2 days,   2 each am x 2 days,  1 each am x 2 days and stop  Plan A = Automatic = continue  symbicort 160  Take 2 puffs first thing in am and then another 2 puffs about 12 hours later and be sure you take your pepcid (famotidine) at bedtime  Work on inhaler technique:   Plan B = Backup Only use your albuterol as a rescue medication      08/10/2017  f/u ov/Valaree Fresquez re:  Copd II/ AB component / dog allergy  Chief Complaint  Patient presents with  . Follow-up    Breathing has improved  slightly. He states he feels  worse when he goes outside in the heat.   Dyspnea: MMRC2 = can't walk a nl pace on a flat grade s sob but does fine slow and flat  Cough: every noct arount 3-4 am cough congestion maybe a tsp of white mucus Typically after supper same problem with cough  Sleep: L side horizontal with dog/ coughs worse if faces dog side  SABA use:  Def helps / using it twice daily if goes outside  / not typically needing noct rec Add singulair (montelukast) 10 mg one daily at bedtime to see if helps night time cough but this is not an antedote to the dog you are sleeping with  Reduce the lopressor (metaprolol) to one half tablet twice daily  Change pepcid to take right after supper to see if helps the cough after supper  Continue symbicort 160(same as dulera 200)  Take 2 puffs first thing in am and then another 2 puffs about 12 hours later.  Work on inhaler technique:  relax and gently blow all the way out then take a nice smooth deep breath back in, triggering the inhaler at same time you start breathing in.  Hold for up to 5 seconds if you can. Blow out thru nose. Rinse and gargle with water when done   Sinus CT 08/19/17  L max sinusitis > augmentin x 20 days > cleared 09/17/17     09/21/2017  f/u ov/Kerria Sapien re:  Copd II/ AB component / dog allergy  Chief Complaint  Patient presents with  . Follow-up    breathing has improved some. He is using his albuterol inhaler 4 x per wk on average.  Dyspnea:  MMRC2 also held back by knees and back  Cough: very little s/p rx for sinusitis  SABA use: rarely needing rec Work on maintaining perfect  inhaler technique:  Only use your albuterol    12/22/2017  Extended  f/u ov/Rayven Rettig re: GOLD II/III AB/ ? Bronchiectasis/ Sinusitis component refractory cough/ worse sob Chief Complaint  Patient presents with  . Follow-up    Breathing seems worse recently. He gets out of breath walking short distances such as from parking lot to building today.    Dyspnea:  [employer] pushing feels like need to stop and sit down before finish shopping  Cough: congested/ sinus drainage esp in am with variable yellow mucus/ using benadryl to control pnds  Sleeping: bed flat/ one pillow once a week wakes up due to cough  SABA use: up  3 x daily but never rechallenges  02: no rec Pace yourself but continue to be as active as you can  Only use your albuterol as a rescue medication  Work on inhaler technique.     12/31/2017  Acute extended  ov/Tobey Schmelzle re:  Aecopd/ RAF  Chief Complaint  Patient presents with  . Acute Visit    Increased SOB and cough over the past 3-4 days. He is coughing up some yellow sputum.    Dyspnea:  Worse x 3-4 days now across the room Cough: congested, rattling thick yellow, not using flutter  Sleeping: flat bed, one pillow used  SABA use: up to twice daily, not helping 02: no longer has it / says sats running in 80s for days    No obvious day to day or daytime variability or assoc   mucus plugs or hemoptysis or cp or chest tightness, subjective wheeze or overt sinus or hb symptoms.   Sleeping  without nocturnal  or early am exacerbation  of respiratory  c/o's or need for noct saba. Also denies any obvious fluctuation of symptoms with weather or environmental changes or other aggravating or alleviating factors except as outlined above   No unusual exposure hx or h/o childhood pna/ asthma or knowledge of premature birth.  Current Allergies, Complete Past Medical History, Past Surgical History, Family History, and Social History were reviewed in [employer] record.  ROS  The following are not active complaints unless bolded Hoarseness, sore throat, dysphagia, dental problems, itching, sneezing,  nasal congestion or discharge of excess mucus or purulent secretions, ear ache,   fever, chills, sweats, unintended wt loss or wt gain, classically pleuritic or exertional cp,  orthopnea pnd or arm/hand swelling   or leg swelling, presyncope, palpitations, abdominal pain, anorexia, nausea, vomiting, diarrhea  or change in bowel habits or change in bladder habits, change in stools or change in urine, dysuria, hematuria,  rash, arthralgias, visual complaints, headache, numbness, weakness or ataxia or problems with walking or coordination,  change in mood or  memory.        Current Meds  Medication Sig  . acetaminophen (TYLENOL) 500 MG tablet Take 1,000 mg every 6 (six) hours as needed by mouth.  [name] albuterol (PROVENTIL HFA;VENTOLIN HFA) 108 (90 BASE) MCG/ACT inhaler Inhale 1-2 puffs into the lungs every 6 (six) hours as needed for wheezing or shortness of breath. Reported on 10/03/2015  . apixaban (ELIQUIS) 5 MG TABS tablet Take 1 tablet (5 mg total) 2 (two) times daily by mouth.  . budesonide-formoterol (SYMBICORT) 160-4.5 MCG/ACT inhaler Inhale 2 puffs into the lungs 2 (two) times daily.  . diphenhydrAMINE (BENADRYL) 25 MG tablet Take 25 mg by mouth every 6 (six) hours as needed.  . famotidine (PEPCID) 20 MG tablet One at bedtime  . guaifenesin (HUMIBID E) 400 MG TABS tablet Take 400 mg by mouth every 4 (four) hours.  . metoprolol tartrate (LOPRESSOR) 25 MG tablet Take 1 tablet (25 mg total) 2 (two) times daily by mouth.  . montelukast (SINGULAIR) 10 MG tablet Take 1 tablet (10 mg total) by mouth at bedtime.  . pantoprazole (PROTONIX) 40 MG tablet Take 1 tablet (40 mg total) by mouth daily.  . Tamsulosin HCl (FLOMAX) 0.4 MG CAPS Take 0.4 mg by mouth daily.                    Objective:   Physical Exam  amb wm with audible pseudowheeze better with plm / unusual affect, appears to be in denial re dx of copd  "you said my lungs were fine"    12/31/2017      196  12/22/2017        200  09/21/2017          206  08/10/2017        202  07/17/2017       198  05/11/2017        198  03/30/2017          194  02/20/2017     189 02/02/2017      189  12/12/2016        201  01/09/2016      198   12/12/15 198  lb (89.8 kg)  11/09/15 201 lb 12.8 oz (91.5 kg)  10/03/15 199 lb (90.3 kg)      Vital signs reviewed - Note on arrival 02 sats  91% on 3lpm continuous       HEENT: nl dentition /  oropharynx. Nl external ear canals without cough reflex -  Mild bilateral non-specific turbinate edema     NECK :  without JVD/Nodes/TM/ nl carotid upstrokes bilaterally   LUNGS: no acc muscle use,  Mod barrel  contour chest wall with bilateral  Distant bs s audible wheeze and  without cough on insp or exp maneuver and mod  Hyperresonant  to  percussion bilaterally     CV:  RRR  no s3 or murmur or increase in P2, and no edema   ABD:  soft and nontender with pos mid insp Hoover's  in the supine position. No bruits or organomegaly appreciated, bowel sounds nl  MS:   Nl gait/  ext warm without deformities, calf tenderness, cyanosis or clubbing No obvious joint restrictions   SKIN: warm and dry without lesions    NEURO:  alert, approp, nl sensorium with  no motor or cerebellar deficits apparent.      ekg   Intermittent ? RAF   to 150 then pauses down to 60      Assessment:

## 2017-12-31 NOTE — Progress Notes (Signed)
Spoke with pt and notified of results per Dr. Wert. Pt verbalized understanding and denied any questions. 

## 2017-12-31 NOTE — Assessment & Plan Note (Signed)
05/11/2017  Walked RA x 3 laps @ 185 ft each stopped due to  End of study, nl pace, no sob or desat    - d/c 02 05/11/2017    sats in mid 80s on RA > increased to 91% on 3lpm > to ER for eval

## 2017-12-31 NOTE — ED Provider Notes (Signed)
[facility] EMERGENCY DEPARTMENT Provider Note   CSN: [id number] Arrival date & time: 12/31/17  1451     History   Chief Complaint Chief Complaint  Patient presents with  . Shortness of Breath    HPI Derrick Moon is a 77 y.o. male.  HPI   Patient is a 77 year old male with PMHx of COPD Gold Stage II, HTN, bladder cancer, OSA, and Afib (on Eliquis) who presents with worsening shortness of breath and productive cough since Sunday (5 days).  Patient states his SOB is worth with exertion and when taking deep breaths.  He complains of associated chills, fatigue, nausea, and decreased p.o. intake.  Wife states the last time he presented this way he had pneumonia and A. fib with RVR.  He denies chest pain, palpitations, edema, vomiting, diarrhea, abdominal pain, and urinary complaints.  He was seen in PCP today who referred patient to ED for further evaluation.    Past Medical History:  Diagnosis Date  . AAA (abdominal aortic aneurysm) ([place])   . Anxiety    due to pain in leg  . Arthritis    Osteo  . Bell's palsy   . Cancer [facility])    Bladder cancer  . Cataracts, bilateral   . COPD (chronic obstructive pulmonary disease) (H[facility])    Albuterol inhaler as needed  . Diverticulitis   . Enlarged prostate    takes Flomax daily  . GERD (gastroesophageal reflux disease)    was on Prilosec but has been off for yrs-no issues now with reflux  . Headache    migraines years ago  . History of bronchitis    > 75yrs ago  . History of colon polyps    benign  . History of dizziness    several yrs ago-was on Meclizine but has been off for several yrs-no problems since  . History of hiatal hernia   . Hypertension    takes Amlodipine and Lisinpril daily  . Joint pain   . Joint swelling   . Nocturia   . PAD (peripheral artery disease) (HCC)     bilateral superficial femoral artery occlusions   . Paroxysmal atrial fibrillation (HCC)   . Pneumonia    several times with most  recent within 5 yrs ago  . Restless leg syndrome   . Rupture of left quadriceps tendon   . Shortness of breath dyspnea    with exertion  . Sleep apnea    does not use cpap  . Urinary urgency     Patient Active Problem List   Diagnosis Date Noted  . Tobacco use 01/01/2018  . COPD exacerbation ([place]) 12/31/2017  . Otitis media 12/31/2017  . Bronchiectasis without complication ([place]) 40/98/1191  . Acute maxillary sinusitis 08/20/2017  . Cough variant asthma vs uacs  07/17/2017  . Abnormal thyroid blood test 05/07/2017  . Pulmonary infiltrates 02/20/2017  . PAF (paroxysmal atrial fibrillation) ([place]) 02/02/2017  . COPD with acute exacerbation ([place]) 02/02/2017  . Acute respiratory failure with hypoxemia ([place]) 02/02/2017  . Atrial fibrillation with RVR ([place]) 02/02/2017  . Acute on chronic respiratory failure ([place]) 02/02/2017  . SIRS (systemic inflammatory response syndrome) ([place]) 02/02/2017  . Cigarette smoker 12/14/2016  . DOE (dyspnea on exertion) 12/12/2015  . COPD GOLD III 12/12/2015  . Atherosclerosis of native arteries of right leg with ulceration of other part of foot ([place]) 09/19/2015  . Atherosclerosis of native arteries of the extremities with ulceration ([place]) 07/27/2015  . S/P shoulder replacement 07/07/2014  .  Aortic ectasia, abdominal ([place]) 02/26/2012  . Essential hypertension 02/26/2009  . DIVERTICULITIS, HX OF 02/26/2009    Past Surgical History:  Procedure Laterality Date  . BICEPS TENDON REPAIR     LEFT  . CARDIOVERSION N/A 02/06/2017   Procedure: CARDIOVERSION;  Surgeon: Jerline Pain, MD;  Location[facility] ENDOSCOPY;  Service: Cardiovascular;  Laterality: N/A;  . CARPAL TUNNEL RELEASE Right 2014  . CHOLECYSTECTOMY  July 16,2010  . COLONOSCOPY    . CYSTOSCOPY    . ESOPHAGOGASTRODUODENOSCOPY    . FEMORAL-POPLITEAL BYPASS GRAFT Right 09/19/2015   Procedure: RIGHT COMMON FEMORAL-ABOVE KNEE POPLITEAL ARTERY    BYPASS GRAFT ;  Surgeon: Conrad Union, MD;  Location: [place];  Service: Vascular;  Laterality: Right;  . FOOT SURGERY Right    x 5t  . HAND SURGERY Right   . HERNIA REPAIR Bilateral    inguinal  . PERIPHERAL VASCULAR CATHETERIZATION N/A 08/02/2015   Procedure: Abdominal Aortogram w/Lower Extremity;  Surgeon: Conrad [place], MD;  Location: [place] CV LAB;  Service: Cardiovascular;  Laterality: N/A;  . QUADRICEPS TENDON REPAIR Left 08/22/2016   Procedure: LEFT OPEN QUADRICEP TENDON REPAIR;  Surgeon: Netta Cedars, MD;  Location: [place];  Service: Orthopedics;  Laterality: Left;  . REVERSE SHOULDER ARTHROPLASTY Right 07/07/2014   Procedure: RIGHT REVERSE TOTAL SHOULDER ;  Surgeon: Netta Cedars, MD;  Location: [place];  Service: Orthopedics;  Laterality: Right;  . REVERSE TOTAL SHOULDER ARTHROPLASTY Right 07/07/2014   Dr Veverly Fells  . ROTATOR CUFF REPAIR     Bilateral   . TEE WITHOUT CARDIOVERSION N/A 02/06/2017   Procedure: TRANSESOPHAGEAL ECHOCARDIOGRAM (TEE);  Surgeon: Jerline Pain, MD;  Location: [facility] ENDOSCOPY;  Service: Cardiovascular;  Laterality: N/A;  . TONSILLECTOMY    . tumor removed from bladder    . VEIN HARVEST Right 09/19/2015   Procedure: WITH NON REVERSE RIGHT GREATER SAPHENOUS VEIN HARVEST;  Surgeon: Conrad Bates, MD;  Location: [place];  Service: Vascular;  Laterality: Right;        Home Medications    Prior to Admission medications   Medication Sig Start Date End Date Taking? Authorizing Provider  acetaminophen (TYLENOL) 500 MG tablet Take 1,000 mg by mouth every 6 (six) hours as needed (for pain or headaches).    Yes [provider]  albuterol (PROVENTIL HFA;VENTOLIN HFA) 108 (90 BASE) MCG/ACT inhaler Inhale 1-2 puffs into the lungs every 6 (six) hours as needed for wheezing or shortness of breath. Reported on 10/03/2015   Yes [provider]  apixaban (ELIQUIS) 5 MG TABS tablet Take 1 tablet (5 mg total) 2 (two) times daily by mouth. 02/08/17  Yes Mikhail, Velta Addison, DO  budesonide-formoterol (SYMBICORT) 160-4.5  MCG/ACT inhaler Inhale 2 puffs into the lungs 2 (two) times daily. 05/11/17  Yes Tanda Rockers, MD  diphenhydrAMINE (BENADRYL) 25 MG tablet Take 25 mg by mouth every 6 (six) hours as needed for itching or allergies.    Yes [provider]  famotidine (PEPCID) 20 MG tablet One at bedtime Patient taking differently: Take 20 mg by mouth at bedtime.  05/11/17  Yes Tanda Rockers, MD  metoprolol tartrate (LOPRESSOR) 25 MG tablet Take 1 tablet (25 mg total) 2 (two) times daily by mouth. 02/08/17  Yes Mikhail, Maryann, DO  montelukast (SINGULAIR) 10 MG tablet Take 1 tablet (10 mg total) by mouth at bedtime. 08/10/17  Yes Tanda Rockers, MD  pantoprazole (PROTONIX) 40 MG tablet Take 1 tablet (40 mg total) by  mouth daily. Patient taking differently: Take 40 mg by mouth daily before breakfast.  05/11/17  Yes Tanda Rockers, MD  Tamsulosin HCl (FLOMAX) 0.4 MG CAPS Take 0.4 mg by mouth daily after supper.    Yes [provider]  guaifenesin (HUMIBID E) 400 MG TABS tablet Take 400 mg by mouth every 4 (four) hours.    [provider]    Family History Family History  Problem Relation Age of Onset  . Parkinson's disease Mother   . AAA (abdominal aortic aneurysm) Father   . Heart defect Sister        Congenital Heart Disease  . Heart disease Sister     Social History Social History   Tobacco Use  . Smoking status: Current Some Day Smoker    Packs/day: 1.00    Years: 45.00    Pack years: 45.00    Types: Cigars  . Smokeless tobacco: Never Used  Substance Use Topics  . Alcohol use: Yes    Alcohol/week: 0.0 standard drinks    Comment: wine occasionally  . Drug use: Yes    Types: Marijuana    Comment: rare; last use 2017     Allergies   Adhesive [tape]; Aspartame and phenylalanine; Other; and Sulfonamide derivatives   Review of Systems Review of Systems  Constitutional: Positive for activity change, appetite change, chills and fatigue. Negative for fever.    HENT: Negative for sore throat.   Eyes: Negative for pain and visual disturbance.  Respiratory: Positive for cough, shortness of breath and wheezing.   Cardiovascular: Negative for chest pain and palpitations.  Gastrointestinal: Positive for nausea. Negative for abdominal pain, diarrhea and vomiting.  Genitourinary: Negative for dysuria and hematuria.  Musculoskeletal: Negative for arthralgias and back pain.  Skin: Negative for color change and rash.  Neurological: Negative for seizures and syncope.  All other systems reviewed and are negative.    Physical Exam Updated Vital Signs BP (!) 143/72 (BP Location: Right Arm)   Pulse 82   Temp 97.9 F (36.6 C) (Oral)   Resp 16   Ht 5\' 11"  (1.803 m)   Wt 87.9 kg   SpO2 91%   BMI 27.03 kg/m   Physical Exam  Constitutional: He is oriented to person, place, and time. He appears well-developed and well-nourished. He appears distressed (mild).  HENT:  Head: Normocephalic and atraumatic.  Mouth/Throat: Oropharynx is clear and moist.  Eyes: Pupils are equal, round, and reactive to light. Conjunctivae and EOM are normal.  Neck: Normal range of motion. Neck supple.  Cardiovascular: Normal rate and regular rhythm.  No murmur heard. Pulmonary/Chest: Tachypnea noted. He is in respiratory distress (mild). He has wheezes (expiratory). He has rhonchi (diffuse).  Abdominal: Soft. He exhibits no distension. There is no tenderness. There is no guarding.  Musculoskeletal: Normal range of motion. He exhibits no edema.       Right lower leg: Normal.       Left lower leg: Normal.  Neurological: He is alert and oriented to person, place, and time.  Skin: Skin is warm and dry. Capillary refill takes less than 2 seconds.  Psychiatric: He has a normal mood and affect.  Nursing note and vitals reviewed.    ED Treatments / Results  Labs (all labs ordered are listed, but only abnormal results are displayed) Labs Reviewed  BASIC METABOLIC PANEL -  Abnormal; Notable for the following components:      Result Value   Glucose, Bld 109 (*)  All other components within normal limits  CBC - Abnormal; Notable for the following components:   WBC 15.1 (*)    All other components within normal limits  BRAIN NATRIURETIC PEPTIDE - Abnormal; Notable for the following components:   B Natriuretic Peptide 152.0 (*)    All other components within normal limits  COMPREHENSIVE METABOLIC PANEL - Abnormal; Notable for the following components:   Glucose, Bld 113 (*)    Total Protein 6.4 (*)    Albumin 3.1 (*)    AST 52 (*)    ALT 52 (*)    All other components within normal limits  CBC - Abnormal; Notable for the following components:   WBC 11.0 (*)    All other components within normal limits  BASIC METABOLIC PANEL - Abnormal; Notable for the following components:   Glucose, Bld 175 (*)    All other components within normal limits  TSH - Abnormal; Notable for the following components:   TSH 0.203 (*)    All other components within normal limits  CULTURE, BLOOD (ROUTINE X 2)  CULTURE, BLOOD (ROUTINE X 2)  MRSA PCR SCREENING  URINE CULTURE  RESPIRATORY PANEL BY PCR  PROCALCITONIN  PROCALCITONIN  HIV ANTIBODY (ROUTINE TESTING W REFLEX)  T4, FREE  URINALYSIS, ROUTINE W REFLEX MICROSCOPIC  I-STAT TROPONIN, ED  I-STAT CG4 LACTIC ACID, ED  I-STAT CG4 LACTIC ACID, ED    EKG EKG Interpretation  Date/Time:  Thursday December 31 2017 16:54:40 EDT Ventricular Rate:  81 PR Interval:  152 QRS Duration: 80 QT Interval:  365 QTC Calculation: 424 R Axis:   69 Text Interpretation:  Sinus rhythm Minimal ST depression, inferior leads No acute changes Nonspecific ST and T wave abnormality Confirmed by Varney Biles ([id number]) on 12/31/2017 5:30:39 PM   Radiology Dg Chest 2 View  Result Date: 12/31/2017 CLINICAL DATA:  Shortness of breath, AFib EXAM: CHEST - 2 VIEW COMPARISON:  08/10/2017 FINDINGS: There is bilateral chronic interstitial  thickening. The lungs are hyperinflated likely secondary to COPD. There is no focal consolidation. There is no pleural effusion or pneumothorax. The heart and mediastinal contours are unremarkable. There is no acute osseous abnormality. IMPRESSION: No active cardiopulmonary disease. Electronically Signed   By: Kathreen Devoid   On: 12/31/2017 15:28   [place] Cm  Result Date: 12/31/2017 CLINICAL DATA:  Recurrent sinusitis EXAM: CT PARANASAL SINUS LIMITED WITHOUT CONTRAST TECHNIQUE: Non-contiguous multidetector CT images of the paranasal sinuses were obtained in a single plane without contrast. COMPARISON:  CT sinus 09/17/2017 FINDINGS: The paranasal sinuses are free of fluid.  No mucosal thickening. IMPRESSION: Clear paranasal sinuses. Electronically Signed   By: Ulyses Jarred M.D.   On: 12/31/2017 05:01    Procedures Procedures (including critical care time)  Medications Ordered in ED Medications  azithromycin (ZITHROMAX) 500 mg in sodium chloride 0.9 % 250 mL IVPB (0 mg Intravenous Stopped 12/31/17 1848)  pantoprazole (PROTONIX) EC tablet 40 mg (40 mg Oral Given 01/01/18 0838)  tamsulosin (FLOMAX) capsule 0.4 mg (has no administration in time range)  budesonide (PULMICORT) nebulizer solution 0.5 mg (0.5 mg Nebulization Given 01/01/18 1120)  arformoterol (BROVANA) nebulizer solution 15 mcg (15 mcg Nebulization Given 01/01/18 1125)  apixaban (ELIQUIS) tablet 5 mg (5 mg Oral Given 01/01/18 0838)  diphenhydrAMINE (BENADRYL) capsule 25 mg (has no administration in time range)  montelukast (SINGULAIR) tablet 10 mg (10 mg Oral Not Given 12/31/17 2250)  ipratropium-albuterol (DUONEB) 0.5-2.5 (3) MG/3ML nebulizer solution 3 mL (3 mLs Nebulization  Given 01/01/18 1115)  ondansetron (ZOFRAN) tablet 4 mg (has no administration in time range)    Or  ondansetron (ZOFRAN) injection 4 mg (has no administration in time range)  acetaminophen (TYLENOL) tablet 650 mg (has no administration in time  range)    Or  acetaminophen (TYLENOL) suppository 650 mg (has no administration in time range)  guaiFENesin (MUCINEX) 12 hr tablet 600 mg (600 mg Oral Given 01/01/18 0838)  albuterol (PROVENTIL) (2.5 MG/3ML) 0.083% nebulizer solution 2.5 mg (has no administration in time range)  methylPREDNISolone sodium succinate (SOLU-MEDROL) 125 mg/2 mL injection 60 mg (60 mg Intravenous Given 01/01/18 1317)  famotidine (PEPCID) tablet 20 mg (has no administration in time range)  amLODipine (NORVASC) tablet 5 mg (5 mg Oral Given 01/01/18 0839)  hydrALAZINE (APRESOLINE) injection 10 mg (10 mg Intravenous Given 01/01/18 1112)  hydrALAZINE (APRESOLINE) tablet 25 mg (25 mg Oral Given 01/01/18 1317)  ipratropium-albuterol (DUONEB) 0.5-2.5 (3) MG/3ML nebulizer solution 3 mL (3 mLs Nebulization Given 12/31/17 1702)  ceFEPIme (MAXIPIME) 2 g in sodium chloride 0.9 % 100 mL IVPB (0 g Intravenous Stopped 12/31/17 1809)  methylPREDNISolone sodium succinate (SOLU-MEDROL) 125 mg/2 mL injection 125 mg (125 mg Intravenous Given 12/31/17 1736)  0.9 %  sodium chloride infusion ( Intravenous New Bag/Given 01/01/18 0037)     Initial Impression / Assessment and Plan / ED Course  I have reviewed the triage vital signs and the nursing notes.  Pertinent labs & imaging results that were available during my care of the patient were reviewed by me and considered in my medical decision making (see chart for details).     Patient is a 77 year old male with PMHx of COPD, HTN, bladder cancer, OSA, and Afib (on Eliquis) who presents with worsening shortness of breath, productive cough, chills, nausea and fatigue x 1 week. Patient admitted for the same 1 year ago for PNA and Afib with RVR.  On arrival patient hypoxic to 83% with improvement to mid 90s on 4 L nasal cannula.  Concern for COPD exacerbation versus pneumonia.  Patient with 2 out of 4 SIRS criteria given leukocytosis 15 and new hypoxia.  Code sepsis initiated and patient  covered with azithromycin and cefepime given history of structural lung disease as COPD Gold stage II criteria.  Will give duo nebs and steroids to cover for COPD exacerbation.  Patient placed on as needed BiPAP as he had no improvement with DuoNeb.  Lactic acid 1.56.  CXR with COPD however no focal consolidation to suggest PNA.  Discussed case with hospitalist who will admit.  RVP and procal added.  Final Clinical Impressions(s) / ED Diagnoses   Final diagnoses:  COPD exacerbation ([place])  SIRS (systemic inflammatory response syndrome) [facility])    ED Discharge Orders    None       Fabian November, MD 01/01/18 [place], [place], MD 01/02/18 1610

## 2017-12-31 NOTE — Assessment & Plan Note (Signed)
Recurrent RAF up to 150  ? Related to hypoxemia/ aecopd > refuses ambulance so send to [facility] by w/c with wife to transport

## 2017-12-31 NOTE — ED Provider Notes (Signed)
Patient placed in Quick Look pathway, seen and evaluated   Chief Complaint: Shortness of breath  HPI:   Patient presents with worsening shortness of breath.  He reports is worse on exertion.  He has history of COPD and age atrial fibrillation, however this is worse than normal.  It got worse 5 days ago.  He denies any chest pain, new leg pain or swelling.   ROS: Shortness of breath, negative for chest pain  Physical Exam:   Gen: No distress  Neuro: Awake and Alert  Skin: Warm    Focused Exam: Scattered expiratory wheezing, rhonchi in the left base, heart normal rate and rhythm   Initiation of care has begun. The patient has been counseled on the process, plan, and necessity for staying for the completion/evaluation, and the remainder of the medical screening examination    Frederica Kuster, PA-C 12/31/17 1532    Drenda Freeze, MD 01/01/18 1524

## 2017-12-31 NOTE — ED Notes (Signed)
Patient's O2 is down to 83% on room air, he is placed on 3 lit [place]

## 2017-12-31 NOTE — Assessment & Plan Note (Signed)
-   PFTs  05/27/12   FEV1  2.04(62%) ratio 54  - 12/12/2015 try off spiriva since pt says not working > increased need for saba - Spirometry 01/09/2016  FEV1 1.19 (47%)  Ratio 50 p nothing prior and classic curvature on f/v loop - 01/09/2016  After extensive coaching HFA effectiveness =    75% try symbicort 160 2bid   - 12/12/2016  After extensive coaching devie effectiveness =    75% with smi > add spiriva 2 each am  - Spirometry 01/08/2017  FEV1 1.52 (48%)  Ratio 59    - 01/08/2017 trial of bevespi   - 05/11/2017    Changed  back to symb from bevespi -pt preferred  - Spirometry 08/10/2017  FEV1 1.54 (49%)  Ratio 64    - 12/22/2017  After extensive coaching inhaler device,  effectiveness =    75% (short Ti) - 12/22/2017  Walked RA x 3 laps @ 185 ft each stopped due to  End of study, moderately fast pace, sat 89% but    knees/back gave out > sob  -  Alpha one AT screen 12/22/2017  SS  Level 109    Acute flare assoc with atrial arrythmia and ? new aflutter? And hypoxemia > referred to [facility] ER

## 2017-12-31 NOTE — Patient Instructions (Signed)
Go directly to [facility] ER - the triage nurse knows you are coming

## 2017-12-31 NOTE — Assessment & Plan Note (Signed)
-   PFTs  05/27/12   FEV1  2.04(62%) ratio 54  - 12/12/2015  Walked RA x 3 laps @ 185 ft each stopped due to  Leg cramps, no sob, no desats fast pace  Trial off acei 12/12/2015 > improved - 01/08/2017  Walked RA x 3 laps @ 185 ft each stopped due to  End of study, nl pace, no sob or desat   But walked with slt limp and slowed by hip, not breathing    Much worse since last ov >>> Probably mulitfactorial > to ER for expedient w/u

## 2017-12-31 NOTE — ED Triage Notes (Signed)
Pt was seen at his PCP today who called to report that pt was in A Fib. Pt also c/o SOB.

## 2018-01-01 ENCOUNTER — Other Ambulatory Visit: Payer: Self-pay

## 2018-01-01 DIAGNOSIS — Z720 Tobacco use: Secondary | ICD-10-CM | POA: Diagnosis present

## 2018-01-01 LAB — RESPIRATORY PANEL BY PCR
Adenovirus: NOT DETECTED
BORDETELLA PERTUSSIS-RVPCR: NOT DETECTED
CHLAMYDOPHILA PNEUMONIAE-RVPPCR: NOT DETECTED
CORONAVIRUS HKU1-RVPPCR: NOT DETECTED
Coronavirus 229E: NOT DETECTED
Coronavirus NL63: NOT DETECTED
Coronavirus OC43: NOT DETECTED
INFLUENZA A H1-RVPPCR: NOT DETECTED
INFLUENZA A H3-RVPPCR: NOT DETECTED
Influenza A H1 2009: NOT DETECTED
Influenza A: NOT DETECTED
Influenza B: NOT DETECTED
MYCOPLASMA PNEUMONIAE-RVPPCR: NOT DETECTED
Metapneumovirus: NOT DETECTED
Parainfluenza Virus 1: DETECTED — AB
Parainfluenza Virus 2: NOT DETECTED
Parainfluenza Virus 3: NOT DETECTED
Parainfluenza Virus 4: NOT DETECTED
RESPIRATORY SYNCYTIAL VIRUS-RVPPCR: NOT DETECTED
RHINOVIRUS / ENTEROVIRUS - RVPPCR: NOT DETECTED

## 2018-01-01 LAB — CBC
HCT: 48.5 % (ref 39.0–52.0)
Hemoglobin: 14.9 g/dL (ref 13.0–17.0)
MCH: 27.5 pg (ref 26.0–34.0)
MCHC: 30.7 g/dL (ref 30.0–36.0)
MCV: 89.6 fL (ref 80.0–100.0)
PLATELETS: 224 10*3/uL (ref 150–400)
RBC: 5.41 MIL/uL (ref 4.22–5.81)
RDW: 14.8 % (ref 11.5–15.5)
WBC: 11 10*3/uL — AB (ref 4.0–10.5)
nRBC: 0 % (ref 0.0–0.2)

## 2018-01-01 LAB — BASIC METABOLIC PANEL
Anion gap: 9 (ref 5–15)
BUN: 13 mg/dL (ref 8–23)
CALCIUM: 9 mg/dL (ref 8.9–10.3)
CO2: 26 mmol/L (ref 22–32)
CREATININE: 0.98 mg/dL (ref 0.61–1.24)
Chloride: 106 mmol/L (ref 98–111)
GFR calc non Af Amer: >60 mL/min (ref >60)
Glucose, Bld: 175 mg/dL — ABNORMAL HIGH (ref 70–99)
Potassium: 4.2 mmol/L (ref 3.5–5.1)
SODIUM: 141 mmol/L (ref 135–145)

## 2018-01-01 LAB — PROCALCITONIN: PROCALCITONIN: 0.18 ng/mL

## 2018-01-01 LAB — MRSA PCR SCREENING: MRSA BY PCR: NEGATIVE

## 2018-01-01 LAB — T4, FREE: Free T4: 1.3 ng/dL (ref 0.82–1.77)

## 2018-01-01 LAB — HIV ANTIBODY (ROUTINE TESTING W REFLEX): HIV SCREEN 4TH GENERATION: NONREACTIVE

## 2018-01-01 MED ORDER — HYDRALAZINE HCL 20 MG/ML IJ SOLN
10.0000 mg | INTRAMUSCULAR | Status: DC | PRN
  Administered 2018-01-01: 10 mg via INTRAVENOUS
  Filled 2018-01-01: qty 1

## 2018-01-01 MED ORDER — AMLODIPINE BESYLATE 5 MG PO TABS
5.0000 mg | ORAL_TABLET | Freq: Every day | ORAL | Status: DC
  Administered 2018-01-01 – 2018-01-02 (×2): 5 mg via ORAL
  Filled 2018-01-01 (×2): qty 1

## 2018-01-01 MED ORDER — METOPROLOL TARTRATE 5 MG/5ML IV SOLN
5.0000 mg | Freq: Four times a day (QID) | INTRAVENOUS | Status: DC | PRN
  Administered 2018-01-01: 5 mg via INTRAVENOUS

## 2018-01-01 MED ORDER — METOPROLOL TARTRATE 25 MG PO TABS
25.0000 mg | ORAL_TABLET | Freq: Two times a day (BID) | ORAL | Status: DC
  Administered 2018-01-02 – 2018-01-04 (×5): 25 mg via ORAL
  Filled 2018-01-01 (×6): qty 1

## 2018-01-01 MED ORDER — HYDRALAZINE HCL 25 MG PO TABS
25.0000 mg | ORAL_TABLET | Freq: Four times a day (QID) | ORAL | Status: DC
  Administered 2018-01-01 – 2018-01-03 (×9): 25 mg via ORAL
  Filled 2018-01-01 (×9): qty 1

## 2018-01-01 MED ORDER — METOPROLOL TARTRATE 5 MG/5ML IV SOLN
INTRAVENOUS | Status: AC
  Filled 2018-01-01: qty 5

## 2018-01-01 NOTE — Care Management Note (Signed)
Case Management Note  Patient Details  Name: Derrick Moon MRN: [id number] Date of Birth: 20-Jan-1941  Subjective/Objective:   From home with spouse, presents with resp failure with hypoxia, copd ex, sirs, otitis media, paf (eliquis pta), transminitis, htn.                  Action/Plan: NCM will follow for transition of care needs.  Expected Discharge Date:  01/04/18               Expected Discharge Plan:     In-House Referral:     Discharge planning Services  CM Consult  Post Acute Care Choice:    Choice offered to:     DME Arranged:    DME Agency:     HH Arranged:    HH Agency:     Status of Service:  In process, will continue to follow  If discussed at Long Length of Stay Meetings, dates discussed:    Additional Comments:  Zenon Mayo, RN 01/01/2018, 10:16 AM

## 2018-01-01 NOTE — Evaluation (Signed)
Physical Therapy Evaluation Patient Details Name: Derrick Moon MRN: [id number] DOB: [birth year] Today's Date: 01/01/2018   History of Present Illness  77 y.o. male with medical history significant of PAF on Eliquis s/p TEE/DCCV followed by Dr. Jacinta Shoe, AAA, COPD Gold II, HTN, Bladder Ca s/p resection, tobacco use, and BPH; who presents with complaints of progressively worsening shortness of breath over the last 3 to 4 days.      Clinical Impression  Pt admitted with above diagnosis. Pt currently with functional limitations due to the deficits listed below (see PT Problem List). PTA, pt independent with all mobility not wearing home O2, no use of AD. Today pt walking hallway without AD, desats on RA, remains above 91% on 4L with activity. DOE 3/4 quickly. Feel patient would benefit from HHPT with progression to OP cardiac rehab. Pt will benefit from skilled PT to increase their independence and safety with mobility to allow discharge to the venue listed below.    BP: 144/72 start, 163/86 after 125' walking.      Follow Up Recommendations Home health PT    Equipment Recommendations  None recommended by PT    Recommendations for Other Services       Precautions / Restrictions Restrictions Weight Bearing Restrictions: No      Mobility  Bed Mobility Overal bed mobility: Modified Independent                Transfers Overall transfer level: Modified independent Equipment used: None Transfers: Sit to/from Stand Sit to Stand: Min guard         General transfer comment: min guard for safety especially with lines  Ambulation/Gait Ambulation/Gait assistance: Min guard;Supervision Gait Distance (Feet): 125 Feet Assistive device: None Gait Pattern/deviations: Step-through pattern Gait velocity: decreases   General Gait Details: walking without RW, DOE 3/4 vey quickly. 93% on 4L while walking, desats on RA  Stairs            Wheelchair Mobility    Modified  Rankin (Stroke Patients Only)       Balance Overall balance assessment: Needs assistance         Standing balance support: No upper extremity supported Standing balance-Leahy Scale: Fair                               Pertinent Vitals/Pain Pain Assessment: No/denies pain    Home Living Family/patient expects to be discharged to:: Private residence Living Arrangements: Spouse/significant other Available Help at Discharge: Family;Available 24 hours/day Type of Home: House Home Access: Stairs to enter Entrance Stairs-Rails: Psychiatric nurse of Steps: 3 Home Layout: One level Home Equipment: Walker - 2 wheels;Cane - single point;Bedside commode;Shower seat;Toilet riser;Grab bars - toilet;Grab bars - tub/shower;Wheelchair - manual      Prior Function Level of Independence: Independent         Comments: Drives, works on his farm     Journalist, newspaper   Dominant Hand: Right    Extremity/Trunk Assessment   Upper Extremity Assessment Upper Extremity Assessment: Overall WFL for tasks assessed;Generalized weakness    Lower Extremity Assessment Lower Extremity Assessment: Defer to PT evaluation       Communication   Communication: No difficulties  Cognition Arousal/Alertness: Awake/alert Behavior During Therapy: WFL for tasks assessed/performed Overall Cognitive Status: Within Functional Limits for tasks assessed  General Comments General comments (skin integrity, edema, etc.): 4L O2 with activity    Exercises     Assessment/Plan    PT Assessment    PT Problem List         PT Treatment Interventions      PT Goals (Current goals can be found in the Care Plan section)  Acute Rehab PT Goals Patient Stated Goal: not stated    Frequency Min 3X/week   Barriers to discharge        Co-evaluation               AM-PAC PT "6 Clicks" Daily Activity  Outcome Measure  Difficulty turning over in bed (including adjusting bedclothes, sheets and blankets)?: None Difficulty moving from lying on back to sitting on the side of the bed? : None Difficulty sitting down on and standing up from a chair with arms (e.g., wheelchair, bedside commode, etc,.)?: None Help needed moving to and from a bed to chair (including a wheelchair)?: None Help needed walking in hospital room?: None Help needed climbing 3-5 steps with a railing? : None 6 Click Score: 24    End of Session Equipment Utilized During Treatment: Gait belt;Oxygen Activity Tolerance: Patient tolerated treatment well   Nurse Communication: Mobility status PT Visit Diagnosis: Unsteadiness on feet (R26.81);Other abnormalities of gait and mobility (R26.89);Muscle weakness (generalized) (M62.81)    Time: 4503-8882 PT Time Calculation (min) (ACUTE ONLY): 27 min   Charges:   PT Evaluation $PT Eval Low Complexity: 1 Low PT Treatments $Gait Training: 8-22 mins        Reinaldo Berber, PT, DPT Acute Rehabilitation Services Pager: [phone] Office: [phone]    Reinaldo Berber 01/01/2018, 3:05 PM

## 2018-01-01 NOTE — Progress Notes (Signed)
[facility] Progress Note  Patient: Derrick Moon LDJ:[id number]   PCP: [name], MD DOB: 12-12-1940   DOA: 12/31/2017   DOS: 01/01/2018   Date of Service: the patient was seen and examined on 01/01/2018  Brief hospital course: Pt. with PMH of PAF on Eliquis S/P TEE DCCV, AAA, COPD, HTN, bladder cancer S/P resection, active smoker, BPH; admitted on 12/31/2017, presented with complaint of shortness of breath, was found to have parainfluenza bronchitis causing COPD exacerbation with acute hypoxic respiratory failure. Currently further plan is continue current care.  Subjective: He is feeling better breathing better.  Still has tachypnea.  Denies any chest pain but feels chest is tight.  No nausea no vomiting.  Still has cough without any expectoration.  No diarrhea.  No aspiration. 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  Assessment and Plan: 1.  Acute hypoxic respiratory failure. Parainfluenza bronchitis. COPD exacerbation. Continue IV steroids, continue antibiotics. Follow-up on cultures. Continue nebulizers.  2.  SIRS. Acute otitis media. On admission patient was found to have acute otitis media with SIRS-like picture. Follow-up on cultures. Procalcitonin is not significantly elevated. We will discontinue cefepime and continue azithromycin for now.  3.  Paroxysmal A. fib. On chronic anticoagulation. Currently rate controlled. Continue Eliquis.  4.  Mild elevation of AST ALT. Monitor outpatient.   Diet: Cardiac diet DVT Prophylaxis: subcutaneous Heparin  Advance goals of care discussion: Full code  Family Communication: no family was present at bedside, at the time of interview.   Disposition:  Discharge to home.  Consultants: none Procedures: none  Scheduled Meds: . amLODipine  5 mg Oral Daily  . apixaban  5 mg Oral BID  . arformoterol  15 mcg  Nebulization BID  . budesonide (PULMICORT) nebulizer solution  0.5 mg Nebulization BID  . famotidine  20 mg Oral QHS  . guaiFENesin  600 mg Oral BID  . hydrALAZINE  25 mg Oral Q6H  . ipratropium-albuterol  3 mL Nebulization QID  . methylPREDNISolone (SOLU-MEDROL) injection  60 mg Intravenous Q8H  . montelukast  10 mg Oral QHS  . pantoprazole  40 mg Oral QAC breakfast  . tamsulosin  0.4 mg Oral QPC supper   Continuous Infusions: . azithromycin Stopped (12/31/17 1848)   PRN Meds: acetaminophen **OR** acetaminophen, albuterol, diphenhydrAMINE, hydrALAZINE, ondansetron **OR** ondansetron (ZOFRAN) IV Antibiotics: Anti-infectives (From admission, onward)   Start     Dose/Rate Route Frequency Ordered Stop   01/01/18 0200  ceFEPIme (MAXIPIME) 1 g in sodium chloride 0.9 % 100 mL IVPB  Status:  Discontinued     1 g 200 mL/hr over 30 Minutes Intravenous Every 8 hours 12/31/17 1739 01/01/18 1156   12/31/17 1715  ceFEPIme (MAXIPIME) 2 g in sodium chloride 0.9 % 100 mL IVPB     2 g 200 mL/hr over 30 Minutes Intravenous  Once 12/31/17 1702 12/31/17 1809   12/31/17 1715  azithromycin (ZITHROMAX) 500 mg in sodium chloride 0.9 % 250 mL IVPB     500 mg 250 mL/hr over 60 Minutes Intravenous Every 24 hours 12/31/17 1702         Objective: Physical Exam: Vitals:   01/01/18 1115 01/01/18 1158 01/01/18 1606 01/01/18 1620  BP:  (!) 143/72  (!) 159/71  Pulse:  82  70  Resp:  16  20  Temp:  97.9 F (36.6 C)  97.7  F (36.5 C)  TempSrc:  Oral  Oral  SpO2: 92% 91% 93% 92%  Weight:      Height:        Intake/Output Summary (Last 24 hours) at 01/01/2018 1710 Last data filed at 01/01/2018 1300 Gross per 24 hour  Intake 480 ml  Output 425 ml  Net 55 ml   Filed Weights   12/31/17 1458 12/31/17 2203  Weight: 88.9 kg 87.9 kg   General: Alert, Awake and Oriented to Time, Place and Person. Appear in moderate distress, affect appropriate Eyes: PERRL, Conjunctiva normal ENT: Oral Mucosa clear  moist. Neck: no JVD, no Abnormal Mass Or lumps Cardiovascular: S1 and S2 Present, no Murmur, Peripheral Pulses Present Respiratory: increased respiratory effort, Bilateral Air entry equal and Decreased, no use of accessory muscle, bilateral Crackles, bilateral wheezes Abdomen: Bowel Sound present, Soft and no tenderness, no hernia Skin: no redness, no Rash, no induration Extremities: no Pedal edema, no calf tenderness Neurologic: Grossly no focal neuro deficit. Bilaterally Equal motor strength  Data Reviewed: CBC: Recent Labs  Lab 12/31/17 1512 01/01/18 0322  WBC 15.1* 11.0*  HGB 15.4 14.9  HCT 51.0 48.5  MCV 91.1 89.6  PLT 233 242   Basic Metabolic Panel: Recent Labs  Lab 12/31/17 1512 12/31/17 1700 01/01/18 0322  NA 141 139 141  K 3.8 3.7 4.2  CL 106 104 106  CO2 28 27 26   GLUCOSE 109* 113* 175*  BUN 11 10 13   CREATININE 1.14 1.12 0.98  CALCIUM 9.5 9.2 9.0    Liver Function Tests: Recent Labs  Lab 12/31/17 1700  AST 52*  ALT 52*  ALKPHOS 88  BILITOT 0.7  PROT 6.4*  ALBUMIN 3.1*   No results for input(s): LIPASE, AMYLASE in the last 168 hours. No results for input(s): AMMONIA in the last 168 hours. Coagulation Profile: No results for input(s): INR, PROTIME in the last 168 hours. Cardiac Enzymes: No results for input(s): CKTOTAL, CKMB, CKMBINDEX, TROPONINI in the last 168 hours. BNP (last 3 results) No results for input(s): PROBNP in the last 8760 hours. CBG: No results for input(s): GLUCAP in the last 168 hours. Studies: No results found.   Time spent: 35 minutes  Author: Berle Mull, MD [facility] Pager: [phone] 01/01/2018 5:10 PM  If 7PM-7AM, please contact night-coverage at [email or web address], password [facility]

## 2018-01-01 NOTE — Progress Notes (Signed)
EKG obtained for sustained HR of 120-150. Metoprolol 5mg  given during EKG. MD notified.  Kinnie Scales, RN 01/01/18 6:54 PM

## 2018-01-01 NOTE — Evaluation (Signed)
Occupational Therapy Evaluation Patient Details Name: Derrick Moon MRN: [id number] DOB: 1940-11-12 Today's Date: 01/01/2018    History of Present Illness 77 y.o. male with medical history significant of PAF on Eliquis s/p TEE/DCCV followed by Dr. Jacinta Shoe, AAA, COPD Gold II, HTN, Bladder Ca s/p resection, tobacco use, and BPH; who presents with complaints of progressively worsening shortness of breath over the last 3 to 4 days.     Clinical Impression   Pt admitted with the above diagnoses and presents with below problem list. Pt will benefit from continued acute OT to address the below listed deficits and maximize independence with basic ADLs prior to d/c home. 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 Pt presents with decreased activity tolerance. Currently pt is setup to min guard with ADLs and functional transfers/mobility.      Follow Up Recommendations  No OT follow up    Equipment Recommendations  None recommended by OT    Recommendations for Other Services PT consult     Precautions / Restrictions Restrictions Weight Bearing Restrictions: No      Mobility Bed Mobility Overal bed mobility: Modified Independent                Transfers Overall transfer level: Needs assistance Equipment used: None Transfers: Sit to/from Stand Sit to Stand: Min guard         General transfer comment: min guard for safety especially with lines    Balance Overall balance assessment: Needs assistance         Standing balance support: No upper extremity supported Standing balance-Leahy Scale: Fair                             ADL either performed or assessed with clinical judgement   ADL Overall ADL's : Needs assistance/impaired Eating/Feeding: Set up;Sitting   Grooming: Set up;Min guard;Sitting;Standing   Upper Body Bathing: Set up;Sitting   Lower Body Bathing: Min guard;Sit to/from stand   Upper Body Dressing : Set  up;Sitting   Lower Body Dressing: Min guard;Sit to/from stand   Toilet Transfer: Min guard;Ambulation   Toileting- Clothing Manipulation and Hygiene: Set up;Min guard;Sitting/lateral lean;Sit to/from stand   Tub/ Shower Transfer: Min guard   Functional mobility during ADLs: Min guard General ADL Comments: Pt completed bed mobility and in-room functional mobility to recliner.      Vision         Perception     Praxis      Pertinent Vitals/Pain Pain Assessment: No/denies pain     Hand Dominance Right   Extremity/Trunk Assessment Upper Extremity Assessment Upper Extremity Assessment: Overall WFL for tasks assessed;Generalized weakness   Lower Extremity Assessment Lower Extremity Assessment: Defer to PT evaluation       Communication Communication Communication: No difficulties   Cognition Arousal/Alertness: Awake/alert Behavior During Therapy: WFL for tasks assessed/performed Overall Cognitive Status: Within Functional Limits for tasks assessed                                     General Comments  On 4L supplemental O2 throughout session with sats ranging 86-92.     Exercises     Shoulder Instructions      Home Living Family/patient expects to be discharged to:: Private residence Living Arrangements: Spouse/significant other Available Help at Discharge: Family;Available 24 hours/day Type of Home: House  Home Access: Stairs to enter Entrance Stairs-Number of Steps: 3 Entrance Stairs-Rails: Right;Left Home Layout: One level     Bathroom Shower/Tub: Walk-in shower         Home Equipment: Environmental consultant - 2 wheels;Cane - single point;Bedside commode;Shower seat;Toilet riser;Grab bars - toilet;Grab bars - tub/shower;Wheelchair - manual          Prior Functioning/Environment Level of Independence: Independent        Comments: Drives, works on his farm        OT Problem List: Decreased activity tolerance;Impaired balance (sitting and/or  standing);Decreased knowledge of use of DME or AE;Decreased knowledge of precautions;Cardiopulmonary status limiting activity      OT Treatment/Interventions: Self-care/ADL training;Energy conservation;DME and/or AE instruction;Therapeutic activities;Patient/family education;Balance training    OT Goals(Current goals can be found in the care plan section) Acute Rehab OT Goals Patient Stated Goal: not stated OT Goal Formulation: With patient Time For Goal Achievement: 01/08/18 Potential to Achieve Goals: Good ADL Goals Pt Will Perform Lower Body Bathing: with modified independence;sit to/from stand Pt Will Perform Lower Body Dressing: with modified independence;sit to/from stand Pt Will Transfer to Toilet: with modified independence;ambulating Pt Will Perform Toileting - Clothing Manipulation and hygiene: with modified independence;sit to/from stand Pt Will Perform Tub/Shower Transfer: Shower transfer;with modified independence;ambulating Additional ADL Goal #1: Pt will independently verbalize 2 energy conservation strategies for ADLs.  OT Frequency: Min 2X/week   Barriers to D/C:            Co-evaluation              AM-PAC PT "6 Clicks" Daily Activity     Outcome Measure Help from another person eating meals?: None Help from another person taking care of personal grooming?: None Help from another person toileting, which includes using toliet, bedpan, or urinal?: A Little Help from another person bathing (including washing, rinsing, drying)?: None Help from another person to put on and taking off regular upper body clothing?: None Help from another person to put on and taking off regular lower body clothing?: None 6 Click Score: 23   End of Session Equipment Utilized During Treatment: Oxygen  Activity Tolerance: Patient tolerated treatment well;Patient limited by fatigue Patient left: in chair;with call bell/phone within reach;Other (comment)(with [facility])  OT  Visit Diagnosis: Unsteadiness on feet (R26.81);Muscle weakness (generalized) (M62.81)                Time: 5809-9833 OT Time Calculation (min): 22 min Charges:  OT General Charges $OT Visit: 1 Visit OT Evaluation $OT Eval Low Complexity: [place], OT Acute Rehabilitation Services Pager: [phone] Office: [phone]   Hortencia Pilar 01/01/2018, 1:56 PM

## 2018-01-01 NOTE — Care Management Note (Addendum)
Case Management Note  Patient Details  Name: Derrick Moon MRN: [id number] Date of Birth: [birth year]  Subjective/Objective:   From home with spouse, presents with resp failure with hypoxia, copd ex, sirs, otitis media, paf (eliquis pta), transminitis, htn.  NCM spoke with wife, offered choice for [facility], HHPT, she chose [facility], referral made to [facility], soc will begin 24-48hrs post dc. Patient has rolling walker, cane and w/chair at home.  Will need HHRN, HHPT orders with face to face prior to dc.    10/14 Tomi Bamberger RN, BSN- patient dc home today with neb machine and home oxygen with [facility] services.           Action/Plan: DC home when ready.   Expected Discharge Date:  01/04/18               Expected Discharge Plan:  [place]  In-House Referral:     Discharge planning Services  CM Consult  Post Acute Care Choice:  Home Health Choice offered to:  Spouse  DME Arranged:   oxygen, neb machine [facility]  HH Arranged:  RN, Disease Management, PT HH Agency:   [facility]  Status of Service:  Completed, signed off  If discussed at [place] of Stay Meetings, dates discussed:    Additional Comments:  Zenon Mayo, RN 01/01/2018, 4:12 PM

## 2018-01-01 NOTE — Discharge Instructions (Signed)
Information on my medicine - ELIQUIS (apixaban)  You were taking this medication prior to this hospital admission.   This medication education was reviewed with me or my healthcare representative as part of my discharge preparation.   Why was Eliquis prescribed for you?- You were taking this medication prior to this hospital admission.   Eliquis was prescribed for you to reduce the risk of a blood clot forming that can cause a stroke if you have a medical condition called atrial fibrillation (a type of irregular heartbeat).  What do You need to know about Eliquis ? Take your Eliquis TWICE DAILY - one tablet in the morning and one tablet in the evening with or without food. If you have difficulty swallowing the tablet whole please discuss with your pharmacist how to take the medication safely.  Take Eliquis exactly as prescribed by your doctor and DO NOT stop taking Eliquis without talking to the doctor who prescribed the medication.  Stopping may increase your risk of developing a stroke.  Refill your prescription before you run out.  After discharge, you should have regular check-up appointments with your healthcare provider that is prescribing your Eliquis.  In the future your dose may need to be changed if your kidney function or weight changes by a significant amount or as you get older.  What do you do if you miss a dose? If you miss a dose, take it as soon as you remember on the same day and resume taking twice daily.  Do not take more than one dose of ELIQUIS at the same time to make up a missed dose.  Important Safety Information A possible side effect of Eliquis is bleeding. You should call your healthcare provider right away if you experience any of the following: ? Bleeding from an injury or your nose that does not stop. ? Unusual colored urine (red or dark brown) or unusual colored stools (red or black). ? Unusual bruising for unknown reasons. ? A serious fall or if you  hit your head (even if there is no bleeding).  Some medicines may interact with Eliquis and might increase your risk of bleeding or clotting while on Eliquis. To help avoid this, consult your healthcare provider or pharmacist prior to using any new prescription or non-prescription medications, including herbals, vitamins, non-steroidal anti-inflammatory drugs (NSAIDs) and supplements.  This website has more information on Eliquis (apixaban): http://www.eliquis.com/eliquis/home

## 2018-01-02 LAB — BASIC METABOLIC PANEL
ANION GAP: 9 (ref 5–15)
BUN: 17 mg/dL (ref 8–23)
CO2: 23 mmol/L (ref 22–32)
Calcium: 9.3 mg/dL (ref 8.9–10.3)
Chloride: 110 mmol/L (ref 98–111)
Creatinine, Ser: 1.22 mg/dL (ref 0.61–1.24)
GFR calc Af Amer: >60 mL/min (ref >60)
GFR, EST NON AFRICAN AMERICAN: 55 mL/min — AB (ref >60)
GLUCOSE: 181 mg/dL — AB (ref 70–99)
Potassium: 3.7 mmol/L (ref 3.5–5.1)
Sodium: 142 mmol/L (ref 135–145)

## 2018-01-02 LAB — CBC
HCT: 48.2 % (ref 39.0–52.0)
HEMOGLOBIN: 14.6 g/dL (ref 13.0–17.0)
MCH: 26.9 pg (ref 26.0–34.0)
MCHC: 30.3 g/dL (ref 30.0–36.0)
MCV: 88.9 fL (ref 80.0–100.0)
NRBC: 0 % (ref 0.0–0.2)
Platelets: 270 10*3/uL (ref 150–400)
RBC: 5.42 MIL/uL (ref 4.22–5.81)
RDW: 14.6 % (ref 11.5–15.5)
WBC: 20 10*3/uL — ABNORMAL HIGH (ref 4.0–10.5)

## 2018-01-02 LAB — PROCALCITONIN: Procalcitonin: 0.11 ng/mL

## 2018-01-02 LAB — MAGNESIUM: MAGNESIUM: 2.3 mg/dL (ref 1.7–2.4)

## 2018-01-02 MED ORDER — AZITHROMYCIN 250 MG PO TABS
500.0000 mg | ORAL_TABLET | Freq: Every day | ORAL | Status: DC
  Administered 2018-01-02 – 2018-01-04 (×3): 500 mg via ORAL
  Filled 2018-01-02 (×3): qty 2

## 2018-01-02 MED ORDER — BISACODYL 10 MG RE SUPP
10.0000 mg | Freq: Once | RECTAL | Status: AC
  Administered 2018-01-02: 10 mg via RECTAL
  Filled 2018-01-02: qty 1

## 2018-01-02 MED ORDER — POTASSIUM CHLORIDE CRYS ER 20 MEQ PO TBCR
20.0000 meq | EXTENDED_RELEASE_TABLET | Freq: Once | ORAL | Status: AC
  Administered 2018-01-02: 20 meq via ORAL
  Filled 2018-01-02: qty 1

## 2018-01-02 MED ORDER — IPRATROPIUM-ALBUTEROL 0.5-2.5 (3) MG/3ML IN SOLN
3.0000 mL | Freq: Three times a day (TID) | RESPIRATORY_TRACT | Status: DC
  Administered 2018-01-02 – 2018-01-03 (×2): 3 mL via RESPIRATORY_TRACT
  Filled 2018-01-02 (×2): qty 3

## 2018-01-02 MED ORDER — DOCUSATE SODIUM 100 MG PO CAPS
100.0000 mg | ORAL_CAPSULE | Freq: Two times a day (BID) | ORAL | Status: DC
  Administered 2018-01-02 – 2018-01-04 (×5): 100 mg via ORAL
  Filled 2018-01-02 (×5): qty 1

## 2018-01-02 NOTE — Progress Notes (Signed)
[facility] Progress Note  Patient: Derrick Moon BWI:[id number]   PCP: [name], MD DOB: 1941-02-07   DOA: 12/31/2017   DOS: 01/02/2018   Date of Service: the patient was seen and examined on 01/02/2018  Brief hospital course: Pt. with PMH of PAF on Eliquis S/P TEE DCCV, AAA, COPD, HTN, bladder cancer S/P resection, active smoker, BPH; admitted on 12/31/2017, presented with complaint of shortness of breath, was found to have parainfluenza bronchitis causing COPD exacerbation with acute hypoxic respiratory failure. Currently further plan is continue current care.  Subjective: Still feeling short of breath.  No nausea no vomiting no fever no chills.  No chest pain no abdominal pain.  Assessment and Plan: 1.  Acute hypoxic respiratory failure. Parainfluenza bronchitis. COPD exacerbation. Continue IV steroids, continue antibiotics. Follow-up on cultures. Continue nebulizers.  2.  SIRS. Acute otitis media. On admission patient was found to have acute otitis media with SIRS-like picture. Follow-up on cultures. Procalcitonin is not significantly elevated. We will discontinue cefepime and continue azithromycin for now.  3.  Paroxysmal A. fib. On chronic anticoagulation. RVR overnight.  IV Lopressor. Now better Continue Eliquis.  4.  Mild elevation of AST ALT. Monitor outpatient.  5.  Essential hypertension. Blood pressure significantly elevated. New medications added. Monitor.  Diet: Cardiac diet DVT Prophylaxis: subcutaneous Heparin  Advance goals of care discussion: Full code  Family Communication: no family was present at bedside, at the time of interview.   Disposition:  Discharge to home.  Consultants: none Procedures: none  Scheduled Meds: . amLODipine  5 mg Oral Daily  . apixaban  5 mg Oral BID  . arformoterol  15 mcg Nebulization BID  . azithromycin  500 mg Oral Daily  . budesonide (PULMICORT) nebulizer solution  0.5 mg Nebulization BID  .  docusate sodium  100 mg Oral BID  . famotidine  20 mg Oral QHS  . guaiFENesin  600 mg Oral BID  . hydrALAZINE  25 mg Oral Q6H  . ipratropium-albuterol  3 mL Nebulization TID  . methylPREDNISolone (SOLU-MEDROL) injection  60 mg Intravenous Q8H  . metoprolol tartrate  25 mg Oral BID  . montelukast  10 mg Oral QHS  . pantoprazole  40 mg Oral QAC breakfast  . tamsulosin  0.4 mg Oral QPC supper   Continuous Infusions:  PRN Meds: acetaminophen **OR** acetaminophen, albuterol, diphenhydrAMINE, hydrALAZINE, metoprolol tartrate, ondansetron **OR** ondansetron (ZOFRAN) IV Antibiotics: Anti-infectives (From admission, onward)   Start     Dose/Rate Route Frequency Ordered Stop   01/02/18 1000  azithromycin (ZITHROMAX) tablet 500 mg     500 mg Oral Daily 01/02/18 0826     01/01/18 0200  ceFEPIme (MAXIPIME) 1 g in sodium chloride 0.9 % 100 mL IVPB  Status:  Discontinued     1 g 200 mL/hr over 30 Minutes Intravenous Every 8 hours 12/31/17 1739 01/01/18 1156   12/31/17 1715  ceFEPIme (MAXIPIME) 2 g in sodium chloride 0.9 % 100 mL IVPB     2 g 200 mL/hr over 30 Minutes Intravenous  Once 12/31/17 1702 12/31/17 1809   12/31/17 1715  azithromycin (ZITHROMAX) 500 mg in sodium chloride 0.9 % 250 mL IVPB  Status:  Discontinued     500 mg 250 mL/hr over 60 Minutes Intravenous Every 24 hours 12/31/17 1702 01/02/18 0825       Objective: Physical Exam: Vitals:   01/02/18 0627 01/02/18 0700 01/02/18 0848 01/02/18 1126  BP: (!) 147/72 (!) 145/74    Pulse:  71  Resp:  16    Temp:  97.7 F (36.5 C)    TempSrc:  Oral    SpO2:  96% 95% 96%  Weight:      Height:        Intake/Output Summary (Last 24 hours) at 01/02/2018 1650 Last data filed at 01/02/2018 1200 Gross per 24 hour  Intake 539.88 ml  Output 300 ml  Net 239.88 ml   Filed Weights   12/31/17 1458 12/31/17 2203  Weight: 88.9 kg 87.9 kg   General: Alert, Awake and Oriented to Time, Place and Person. Appear in moderate distress, affect  appropriate Eyes: PERRL, Conjunctiva normal ENT: Oral Mucosa clear moist. Neck: no JVD, no Abnormal Mass Or lumps Cardiovascular: S1 and S2 Present, no Murmur, Peripheral Pulses Present Respiratory: increased respiratory effort, Bilateral Air entry equal and Decreased, no use of accessory muscle, bilateral Crackles, bilateral wheezes Abdomen: Bowel Sound present, Soft and no tenderness, no hernia Skin: no redness, no Rash, no induration Extremities: no Pedal edema, no calf tenderness Neurologic: Grossly no focal neuro deficit. Bilaterally Equal motor strength  Data Reviewed: CBC: Recent Labs  Lab 12/31/17 1512 01/01/18 0322 01/02/18 0236  WBC 15.1* 11.0* 20.0*  HGB 15.4 14.9 14.6  HCT 51.0 48.5 48.2  MCV 91.1 89.6 88.9  PLT 233 224 168   Basic Metabolic Panel: Recent Labs  Lab 12/31/17 1512 12/31/17 1700 01/01/18 0322 01/02/18 0236  NA 141 139 141 142  K 3.8 3.7 4.2 3.7  CL 106 104 106 110  CO2 28 27 26 23   GLUCOSE 109* 113* 175* 181*  BUN 11 10 13 17   CREATININE 1.14 1.12 0.98 1.22  CALCIUM 9.5 9.2 9.0 9.3  MG  --   --   --  2.3    Liver Function Tests: Recent Labs  Lab 12/31/17 1700  AST 52*  ALT 52*  ALKPHOS 88  BILITOT 0.7  PROT 6.4*  ALBUMIN 3.1*   No results for input(s): LIPASE, AMYLASE in the last 168 hours. No results for input(s): AMMONIA in the last 168 hours. Coagulation Profile: No results for input(s): INR, PROTIME in the last 168 hours. Cardiac Enzymes: No results for input(s): CKTOTAL, CKMB, CKMBINDEX, TROPONINI in the last 168 hours. BNP (last 3 results) No results for input(s): PROBNP in the last 8760 hours. CBG: No results for input(s): GLUCAP in the last 168 hours. Studies: No results found.   Time spent: 35 minutes  Author: Berle Mull, MD [facility] Pager: [phone] 01/02/2018 4:50 PM  If 7PM-7AM, please contact night-coverage at [email or web address], password [facility]

## 2018-01-03 LAB — BASIC METABOLIC PANEL
Anion gap: 8 (ref 5–15)
BUN: 19 mg/dL (ref 8–23)
CHLORIDE: 109 mmol/L (ref 98–111)
CO2: 24 mmol/L (ref 22–32)
CREATININE: 0.98 mg/dL (ref 0.61–1.24)
Calcium: 9.4 mg/dL (ref 8.9–10.3)
GFR calc Af Amer: >60 mL/min (ref >60)
GLUCOSE: 182 mg/dL — AB (ref 70–99)
Potassium: 3.9 mmol/L (ref 3.5–5.1)
SODIUM: 141 mmol/L (ref 135–145)

## 2018-01-03 LAB — CBC
HEMATOCRIT: 46.8 % (ref 39.0–52.0)
HEMOGLOBIN: 14.7 g/dL (ref 13.0–17.0)
MCH: 28.2 pg (ref 26.0–34.0)
MCHC: 31.4 g/dL (ref 30.0–36.0)
MCV: 89.8 fL (ref 80.0–100.0)
Platelets: 294 10*3/uL (ref 150–400)
RBC: 5.21 MIL/uL (ref 4.22–5.81)
RDW: 14.8 % (ref 11.5–15.5)
WBC: 23.9 10*3/uL — ABNORMAL HIGH (ref 4.0–10.5)
nRBC: 0 % (ref 0.0–0.2)

## 2018-01-03 MED ORDER — PREDNISONE 20 MG PO TABS
50.0000 mg | ORAL_TABLET | Freq: Every day | ORAL | Status: DC
  Administered 2018-01-03 – 2018-01-04 (×2): 50 mg via ORAL
  Filled 2018-01-03 (×2): qty 2

## 2018-01-03 MED ORDER — HYDRALAZINE HCL 50 MG PO TABS
50.0000 mg | ORAL_TABLET | Freq: Three times a day (TID) | ORAL | Status: DC
  Administered 2018-01-03 (×3): 50 mg via ORAL
  Filled 2018-01-03 (×4): qty 1

## 2018-01-03 MED ORDER — AMLODIPINE BESYLATE 5 MG PO TABS
7.5000 mg | ORAL_TABLET | Freq: Every day | ORAL | Status: DC
  Administered 2018-01-03: 7.5 mg via ORAL
  Filled 2018-01-03 (×2): qty 2

## 2018-01-03 MED ORDER — IPRATROPIUM-ALBUTEROL 0.5-2.5 (3) MG/3ML IN SOLN
3.0000 mL | Freq: Three times a day (TID) | RESPIRATORY_TRACT | Status: DC
  Administered 2018-01-03 – 2018-01-04 (×2): 3 mL via RESPIRATORY_TRACT
  Filled 2018-01-03 (×2): qty 3

## 2018-01-03 MED ORDER — HYDRALAZINE HCL 20 MG/ML IJ SOLN
10.0000 mg | INTRAMUSCULAR | Status: DC | PRN
  Administered 2018-01-03: 10 mg via INTRAVENOUS
  Filled 2018-01-03: qty 1

## 2018-01-03 MED ORDER — SALINE SPRAY 0.65 % NA SOLN
1.0000 | NASAL | Status: DC | PRN
  Filled 2018-01-03: qty 44

## 2018-01-03 MED ORDER — BUTALBITAL-APAP-CAFFEINE 50-325-40 MG PO TABS
1.0000 | ORAL_TABLET | Freq: Four times a day (QID) | ORAL | Status: DC | PRN
  Administered 2018-01-03: 1 via ORAL
  Filled 2018-01-03: qty 1

## 2018-01-03 MED ORDER — IPRATROPIUM-ALBUTEROL 0.5-2.5 (3) MG/3ML IN SOLN: 3.0000 mL | Freq: Three times a day (TID) | RESPIRATORY_TRACT | Status: DC

## 2018-01-03 NOTE — Progress Notes (Signed)
SATURATION QUALIFICATIONS: (This note is used to comply with regulatory documentation for home oxygen)  Patient Saturations on Room Air at Rest = 92%  Patient Saturations on Room Air while Ambulating = 87%  Patient Saturations on 4L Liters of oxygen while Ambulating = 90%  Please briefly explain why patient needs home oxygen: patient did not c/o having shortness of breath, but saturation dropped while ambulating in hallway

## 2018-01-03 NOTE — Progress Notes (Signed)
[facility] Progress Note  Patient: Derrick Moon YWV:[id number]   PCP: [name], MD DOB: 14-Jan-1941   DOA: 12/31/2017   DOS: 01/03/2018   Date of Service: the patient was seen and examined on 01/03/2018  Brief hospital course: Pt. with PMH of PAF on Eliquis S/P TEE DCCV, AAA, COPD, HTN, bladder cancer S/P resection, active smoker, BPH; admitted on 12/31/2017, presented with complaint of shortness of breath, was found to have parainfluenza bronchitis causing COPD exacerbation with acute hypoxic respiratory failure. Currently further plan is continue current care.  Subjective: When short of breath.  No chest pain no chest tightness.  Complain about having headache this morning as well as flushed face.  Assessment and Plan: 1.  Acute hypoxic respiratory failure. Parainfluenza bronchitis. COPD exacerbation. Continue steroids, continue antibiotics. Follow-up on cultures. Continue nebulizers.  2.  SIRS. Acute otitis media. On admission patient was found to have acute otitis media with SIRS-like picture. Follow-up on cultures. Procalcitonin is not significantly elevated. We will discontinue cefepime and continue azithromycin for now.  3.  Paroxysmal A. fib. On chronic anticoagulation. RVR overnight.  IV Lopressor. Now better Continue Eliquis.  4.  Mild elevation of AST ALT. Monitor outpatient.  5.  Essential hypertension. Blood pressure significantly elevated. New medications added. Monitor.  Diet: Cardiac diet DVT Prophylaxis: subcutaneous Heparin  Advance goals of care discussion: Full code  Family Communication: no family was present at bedside, at the time of interview.   Disposition:  Discharge to home.  Currently needing 4 L of oxygen from none at his baseline.  Also has significant bronchospasm needing bronchodilator therapy.  Consultants: none Procedures: none  Scheduled Meds: . amLODipine  7.5 mg Oral Daily  . apixaban  5 mg Oral BID  .  arformoterol  15 mcg Nebulization BID  . azithromycin  500 mg Oral Daily  . budesonide (PULMICORT) nebulizer solution  0.5 mg Nebulization BID  . docusate sodium  100 mg Oral BID  . famotidine  20 mg Oral QHS  . guaiFENesin  600 mg Oral BID  . hydrALAZINE  50 mg Oral TID  . ipratropium-albuterol  3 mL Nebulization TID  . metoprolol tartrate  25 mg Oral BID  . montelukast  10 mg Oral QHS  . pantoprazole  40 mg Oral QAC breakfast  . predniSONE  50 mg Oral Q breakfast  . tamsulosin  0.4 mg Oral QPC supper   Continuous Infusions:  PRN Meds: acetaminophen **OR** acetaminophen, albuterol, butalbital-acetaminophen-caffeine, diphenhydrAMINE, hydrALAZINE, ondansetron **OR** ondansetron (ZOFRAN) IV, sodium chloride Antibiotics: Anti-infectives (From admission, onward)   Start     Dose/Rate Route Frequency Ordered Stop   01/02/18 1000  azithromycin (ZITHROMAX) tablet 500 mg     500 mg Oral Daily 01/02/18 0826     01/01/18 0200  ceFEPIme (MAXIPIME) 1 g in sodium chloride 0.9 % 100 mL IVPB  Status:  Discontinued     1 g 200 mL/hr over 30 Minutes Intravenous Every 8 hours 12/31/17 1739 01/01/18 1156   12/31/17 1715  ceFEPIme (MAXIPIME) 2 g in sodium chloride 0.9 % 100 mL IVPB     2 g 200 mL/hr over 30 Minutes Intravenous  Once 12/31/17 1702 12/31/17 1809   12/31/17 1715  azithromycin (ZITHROMAX) 500 mg in sodium chloride 0.9 % 250 mL IVPB  Status:  Discontinued     500 mg 250 mL/hr over 60 Minutes Intravenous Every 24 hours 12/31/17 1702 01/02/18 0825       Objective: Physical Exam: Vitals:  01/03/18 1047 01/03/18 1407 01/03/18 1639 01/03/18 1744  BP: (!) 175/93  (!) 179/83 (!) 172/83  Pulse:   70   Resp: 20  20   Temp: 97.6 F (36.4 C)  97.6 F (36.4 C)   TempSrc: Oral  Oral   SpO2: 97% 96% 98% 97%  Weight:      Height:       No intake or output data in the 24 hours ending 01/03/18 1802 Filed Weights   12/31/17 1458 12/31/17 2203  Weight: 88.9 kg 87.9 kg   General: Alert,  Awake and Oriented to Time, Place and Person. Appear in moderate distress, affect appropriate Eyes: PERRL, Conjunctiva normal ENT: Oral Mucosa clear moist. Neck: no JVD, no Abnormal Mass Or lumps Cardiovascular: S1 and S2 Present, no Murmur, Peripheral Pulses Present Respiratory: increased respiratory effort, Bilateral Air entry equal and Decreased, no use of accessory muscle, bilateral Crackles, bilateral wheezes Abdomen: Bowel Sound present, Soft and no tenderness, no hernia Skin: no redness, no Rash, no induration Extremities: no Pedal edema, no calf tenderness Neurologic: Grossly no focal neuro deficit. Bilaterally Equal motor strength  Data Reviewed: CBC: Recent Labs  Lab 12/31/17 1512 01/01/18 0322 01/02/18 0236 01/03/18 0324  WBC 15.1* 11.0* 20.0* 23.9*  HGB 15.4 14.9 14.6 14.7  HCT 51.0 48.5 48.2 46.8  MCV 91.1 89.6 88.9 89.8  PLT 233 224 270 174   Basic Metabolic Panel: Recent Labs  Lab 12/31/17 1512 12/31/17 1700 01/01/18 0322 01/02/18 0236 01/03/18 0324  NA 141 139 141 142 141  K 3.8 3.7 4.2 3.7 3.9  CL 106 104 106 110 109  CO2 28 27 26 23 24   GLUCOSE 109* 113* 175* 181* 182*  BUN 11 10 13 17 19   CREATININE 1.14 1.12 0.98 1.22 0.98  CALCIUM 9.5 9.2 9.0 9.3 9.4  MG  --   --   --  2.3  --     Liver Function Tests: Recent Labs  Lab 12/31/17 1700  AST 52*  ALT 52*  ALKPHOS 88  BILITOT 0.7  PROT 6.4*  ALBUMIN 3.1*   No results for input(s): LIPASE, AMYLASE in the last 168 hours. No results for input(s): AMMONIA in the last 168 hours. Coagulation Profile: No results for input(s): INR, PROTIME in the last 168 hours. Cardiac Enzymes: No results for input(s): CKTOTAL, CKMB, CKMBINDEX, TROPONINI in the last 168 hours. BNP (last 3 results) No results for input(s): PROBNP in the last 8760 hours. CBG: No results for input(s): GLUCAP in the last 168 hours. Studies: No results found.   Time spent: 35 minutes  Author: Berle Mull, MD [facility] Pager: [phone] 01/03/2018 6:02 PM  If 7PM-7AM, please contact night-coverage at [email or web address], password [facility]

## 2018-01-03 NOTE — Progress Notes (Signed)
10/13 [id number] Central monitor called patient had 8 beat run v tach. Patient denied shortness of breath or palpitation. Vital signs 172/83, 69, 97% 4L [place]. Dr Posey Pronto was made aware [facility] RN.

## 2018-01-04 LAB — CBC
HCT: 45.9 % (ref 39.0–52.0)
Hemoglobin: 14 g/dL (ref 13.0–17.0)
MCH: 27.3 pg (ref 26.0–34.0)
MCHC: 30.5 g/dL (ref 30.0–36.0)
MCV: 89.6 fL (ref 80.0–100.0)
PLATELETS: 273 10*3/uL (ref 150–400)
RBC: 5.12 MIL/uL (ref 4.22–5.81)
RDW: 14.6 % (ref 11.5–15.5)
WBC: 20 10*3/uL — ABNORMAL HIGH (ref 4.0–10.5)
nRBC: 0 % (ref 0.0–0.2)

## 2018-01-04 LAB — BASIC METABOLIC PANEL
Anion gap: 8 (ref 5–15)
BUN: 17 mg/dL (ref 8–23)
CHLORIDE: 108 mmol/L (ref 98–111)
CO2: 26 mmol/L (ref 22–32)
Calcium: 9.2 mg/dL (ref 8.9–10.3)
Creatinine, Ser: 0.88 mg/dL (ref 0.61–1.24)
GFR calc non Af Amer: >60 mL/min (ref >60)
Glucose, Bld: 108 mg/dL — ABNORMAL HIGH (ref 70–99)
POTASSIUM: 3.9 mmol/L (ref 3.5–5.1)
SODIUM: 142 mmol/L (ref 135–145)

## 2018-01-04 MED ORDER — LISINOPRIL 20 MG PO TABS: 20.0000 mg | ORAL_TABLET | Freq: Every day | ORAL | 0 refills | Status: DC

## 2018-01-04 MED ORDER — HYDRALAZINE HCL 50 MG PO TABS
50.0000 mg | ORAL_TABLET | Freq: Three times a day (TID) | ORAL | Status: DC
  Administered 2018-01-04 (×2): 50 mg via ORAL
  Filled 2018-01-04: qty 1

## 2018-01-04 MED ORDER — AMLODIPINE BESYLATE 10 MG PO TABS: 10.0000 mg | ORAL_TABLET | Freq: Every day | ORAL | 0 refills | Status: DC

## 2018-01-04 MED ORDER — AMLODIPINE BESYLATE 10 MG PO TABS
10.0000 mg | ORAL_TABLET | Freq: Every day | ORAL | Status: DC
  Administered 2018-01-04: 10 mg via ORAL

## 2018-01-04 MED ORDER — HYDRALAZINE HCL 50 MG PO TABS: 100.0000 mg | ORAL_TABLET | Freq: Three times a day (TID) | ORAL | Status: DC

## 2018-01-04 MED ORDER — BUDESONIDE-FORMOTEROL FUMARATE 160-4.5 MCG/ACT IN AERO: 2.0000 | INHALATION_SPRAY | Freq: Two times a day (BID) | RESPIRATORY_TRACT | 0 refills | Status: DC

## 2018-01-04 MED ORDER — HYDRALAZINE HCL 50 MG PO TABS: 50.0000 mg | ORAL_TABLET | Freq: Three times a day (TID) | ORAL | 0 refills | Status: DC

## 2018-01-04 MED ORDER — ALBUTEROL SULFATE (2.5 MG/3ML) 0.083% IN NEBU: 2.5000 mg | INHALATION_SOLUTION | RESPIRATORY_TRACT | Status: DC | PRN

## 2018-01-04 MED ORDER — LISINOPRIL 20 MG PO TABS
20.0000 mg | ORAL_TABLET | Freq: Every day | ORAL | Status: DC
  Administered 2018-01-04: 20 mg via ORAL
  Filled 2018-01-04: qty 1

## 2018-01-04 MED ORDER — DOCUSATE SODIUM 100 MG PO CAPS: 100.0000 mg | ORAL_CAPSULE | Freq: Two times a day (BID) | ORAL | 0 refills | Status: DC

## 2018-01-04 MED ORDER — PREDNISONE 10 MG PO TABS: ORAL_TABLET | ORAL | 0 refills | Status: DC

## 2018-01-04 MED ORDER — IPRATROPIUM-ALBUTEROL 0.5-2.5 (3) MG/3ML IN SOLN: 3.0000 mL | Freq: Two times a day (BID) | RESPIRATORY_TRACT | Status: DC

## 2018-01-04 MED ORDER — IPRATROPIUM-ALBUTEROL 0.5-2.5 (3) MG/3ML IN SOLN: 3.0000 mL | RESPIRATORY_TRACT | 0 refills | Status: DC | PRN

## 2018-01-04 NOTE — Care Management Important Message (Signed)
Important Message  Patient Details  Name: Derrick Moon MRN: [id number] Date of Birth: 06/10/1940   Medicare Important Message Given:  Yes    Zenon Mayo, RN 01/04/2018, 12:36 PM

## 2018-01-05 DIAGNOSIS — J9601 Acute respiratory failure with hypoxia: Secondary | ICD-10-CM | POA: Diagnosis not present

## 2018-01-05 DIAGNOSIS — Z9981 Dependence on supplemental oxygen: Secondary | ICD-10-CM | POA: Diagnosis not present

## 2018-01-05 DIAGNOSIS — R7989 Other specified abnormal findings of blood chemistry: Secondary | ICD-10-CM | POA: Diagnosis not present

## 2018-01-05 DIAGNOSIS — Z7901 Long term (current) use of anticoagulants: Secondary | ICD-10-CM | POA: Diagnosis not present

## 2018-01-05 DIAGNOSIS — J9620 Acute and chronic respiratory failure, unspecified whether with hypoxia or hypercapnia: Secondary | ICD-10-CM | POA: Diagnosis not present

## 2018-01-05 DIAGNOSIS — J479 Bronchiectasis, uncomplicated: Secondary | ICD-10-CM | POA: Diagnosis not present

## 2018-01-05 DIAGNOSIS — I10 Essential (primary) hypertension: Secondary | ICD-10-CM | POA: Diagnosis not present

## 2018-01-05 DIAGNOSIS — I480 Paroxysmal atrial fibrillation: Secondary | ICD-10-CM | POA: Diagnosis not present

## 2018-01-05 DIAGNOSIS — J204 Acute bronchitis due to parainfluenza virus: Secondary | ICD-10-CM | POA: Diagnosis not present

## 2018-01-05 DIAGNOSIS — R0602 Shortness of breath: Secondary | ICD-10-CM | POA: Diagnosis not present

## 2018-01-05 DIAGNOSIS — J441 Chronic obstructive pulmonary disease with (acute) exacerbation: Secondary | ICD-10-CM | POA: Diagnosis not present

## 2018-01-05 DIAGNOSIS — H60509 Unspecified acute noninfective otitis externa, unspecified ear: Secondary | ICD-10-CM | POA: Diagnosis not present

## 2018-01-05 DIAGNOSIS — J440 Chronic obstructive pulmonary disease with acute lower respiratory infection: Secondary | ICD-10-CM | POA: Diagnosis not present

## 2018-01-05 DIAGNOSIS — Z7951 Long term (current) use of inhaled steroids: Secondary | ICD-10-CM | POA: Diagnosis not present

## 2018-01-05 LAB — CULTURE, BLOOD (ROUTINE X 2)
CULTURE: NO GROWTH
Culture: NO GROWTH
Special Requests: ADEQUATE
Special Requests: ADEQUATE

## 2018-01-05 NOTE — Discharge Summary (Signed)
[facility] Discharge Summary   Patient: Derrick Moon PXT:[id number]   PCP: [name], MD DOB: 1940/12/04   Date of admission: 12/31/2017   Date of discharge: 01/04/2018     Discharge Diagnoses:  Principal Problem:   COPD exacerbation ([place]) Active Problems:   Essential hypertension   PAF (paroxysmal atrial fibrillation) (H[facility])   SIRS (systemic inflammatory response syndrome) ([place])   Otitis media   Tobacco use   Admitted From: home Disposition:  Home with home health  Recommendations for Outpatient Follow-up:  1. Please follow up with PCP in [place], [facility].   Specialty:  Home Health Services Why:  [facility], HHPT Contact information: [address] [place] [address] [phone]        [name], MD. Schedule an appointment as soon as possible for a visit in 1 week(s).   Specialty:  Internal Medicine Contact information: [address] [place] [address] [place] Follow up.   Why:  oxygen, neb machine Contact information: [place] [address] [phone]          Diet recommendation: cardiac diet  Activity: The patient is advised to gradually reintroduce usual activities.  Discharge Condition: good  Code Status: full code  History of present illness: As per the H and P dictated on admission, "Derrick Moon is a 77 y.o. male with medical history significant of PAF on Eliquis s/p TEE/DCCVfollowed by Dr. Jacinta Shoe, AAA, COPD Gold II, HTN, Bladder Ca s/p resection, tobacco use, and BPH; who presents with complaints of progressively worsening shortness of breath over the last 3 to 4 days.  Symptoms started with complaints of nasal congestion postnasal drip and cough with large amounts of clear thick sputum.  Associated symptoms included complaints of headache, bilateral ear pain, wheezing, and chills.  He  has been using his Symbicort and albuterol inhaler with temporary relief of symptoms.  He became increasingly more winded even walking a few feet.  At home his O2 saturations have been in the 80s for most of the week.  He is not normally on oxygen at home. Denies having any chest pain, nausea, vomiting, diarrhea, dysuria, leg swelling, calf pain, or known recent sick contacts.  Patient had just had a limited CT of the paranasal sinuses without contrast yesterday that revealed clear paranasal sinuses.  He had followed up at Dr. Melvyn Novas office of pulmonology today after being referred from Dr. Dwana Curd and was noted to be in atrial fibrillation and was sent to the hospital for further evaluation.  Patient apparently quit smoking cigarettes in 1990, but still smokes cigars.  He previously was admitted into the hospital in 01/2017, after being found to be in A. fib with RVR secondary to atypical pneumonia and ultimately had to be sent home on oxygen although cultures did not grow out specific organism. "  Hospital Course:  Summary of his active problems in the hospital is as following.   1.  Acute hypoxic respiratory failure. Parainfluenza bronchitis. COPD exacerbation. Continue steroids, completed antibiotics. No growth on cultures. Continue nebulizers.  2.  SIRS. Acute otitis media. On admission patient was found to have acute otitis media with SIRS-like picture. Procalcitonin is not significantly elevated. continue azithromycin for now.  3.  Paroxysmal A. fib. On chronic anticoagulation. RVR overnight.  IV Lopressor. Now better Continue Eliquis.  4.  Mild elevation of AST ALT. Monitor outpatient.  5.  Essential hypertension. Blood pressure significantly elevated. New medications added. Monitor.  All other chronic medical condition were stable during the hospitalization.  Patient was seen by physical therapy, who recommended home health, which was arranged by Education officer, museum and case  Freight forwarder. On the day of the discharge the patient's vitals were stable , and no other acute medical condition were reported by patient. the patient was felt safe to be discharge at home with outpatient PT.  Consultants: none Procedures: none  DISCHARGE MEDICATION: Allergies as of 01/04/2018      Reactions   Adhesive [tape] Other (See Comments)   SKIN IS VERY THIN AND IT TEARS VERY EASILY!!! Please use an alternative   Aspartame And Phenylalanine Nausea Only   Other Rash   Pt reports rash on back, buttocks, shoulders and stomach after arteriogram   Sulfonamide Derivatives Nausea And Vomiting      Medication List    TAKE these medications   acetaminophen 500 MG tablet Commonly known as:  TYLENOL Take 1,000 mg by mouth every 6 (six) hours as needed (for pain or headaches).   albuterol 108 (90 Base) MCG/ACT inhaler Commonly known as:  PROVENTIL HFA;VENTOLIN HFA Inhale 1-2 puffs into the lungs every 6 (six) hours as needed for wheezing or shortness of breath. Reported on 10/03/2015   amLODipine 10 MG tablet Commonly known as:  NORVASC Take 1 tablet (10 mg total) by mouth daily.   apixaban 5 MG Tabs tablet Commonly known as:  ELIQUIS Take 1 tablet (5 mg total) 2 (two) times daily by mouth.   budesonide-formoterol 160-4.5 MCG/ACT inhaler Commonly known as:  SYMBICORT Inhale 2 puffs into the lungs 2 (two) times daily.   diphenhydrAMINE 25 MG tablet Commonly known as:  BENADRYL Take 25 mg by mouth every 6 (six) hours as needed for itching or allergies.   docusate sodium 100 MG capsule Commonly known as:  COLACE Take 1 capsule (100 mg total) by mouth 2 (two) times daily.   famotidine 20 MG tablet Commonly known as:  PEPCID One at bedtime What changed:    how much to take  how to take this  when to take this  additional instructions   guaifenesin 400 MG Tabs tablet Commonly known as:  HUMIBID E Take 400 mg by mouth every 4 (four) hours.   hydrALAZINE 50 MG  tablet Commonly known as:  APRESOLINE Take 1 tablet (50 mg total) by mouth 3 (three) times daily.   ipratropium-albuterol 0.5-2.5 (3) MG/3ML Soln Commonly known as:  DUONEB Take 3 mLs by nebulization every 4 (four) hours as needed.   lisinopril 20 MG tablet Commonly known as:  PRINIVIL,ZESTRIL Take 1 tablet (20 mg total) by mouth daily.   metoprolol tartrate 25 MG tablet Commonly known as:  LOPRESSOR Take 1 tablet (25 mg total) 2 (two) times daily by mouth.   montelukast 10 MG tablet Commonly known as:  SINGULAIR Take 1 tablet (10 mg total) by mouth at bedtime.   pantoprazole 40 MG tablet Commonly known as:  PROTONIX Take 1 tablet (40 mg total) by mouth daily. What changed:  when to take this   predniSONE 10 MG tablet Commonly known as:  DELTASONE Take 40mg  daily for 3days,Take 30mg  daily for 3days,Take 20mg  daily for 3days,Take 10mg  daily for 3days, then stop.   tamsulosin 0.4 MG Caps capsule Commonly known as:  FLOMAX Take 0.4 mg by mouth daily after supper.  Allergies  Allergen Reactions  . Adhesive [Tape] Other (See Comments)    SKIN IS VERY THIN AND IT TEARS VERY EASILY!!! Please use an alternative  . Aspartame And Phenylalanine Nausea Only  . Other Rash    Pt reports rash on back, buttocks, shoulders and stomach after arteriogram  . Sulfonamide Derivatives Nausea And Vomiting   Discharge Instructions    Diet - low sodium heart healthy   Complete by:  As directed    Discharge instructions   Complete by:  As directed    It is important that you read following instructions as well as go over your medication list with RN to help you understand your care after this hospitalization.  Discharge Instructions: Please follow-up with PCP in one week  Please request your primary care physician to go over all Hospital Tests and Procedure/Radiological results at the follow up,  Please get all Hospital records sent to your PCP by signing hospital release before you  go home.   Do not take more than prescribed Pain, Sleep and Anxiety Medications. You were cared for by a hospitalist during your hospital stay. If you have any questions about your discharge medications or the care you received while you were in the hospital after you are discharged, you can call the unit and ask to speak with the hospitalist on call if the hospitalist that took care of you is not available.  Once you are discharged, your primary care physician will handle any further medical issues. Please note that NO REFILLS for any discharge medications will be authorized once you are discharged, as it is imperative that you return to your primary care physician (or establish a relationship with a primary care physician if you do not have one) for your aftercare needs so that they can reassess your need for medications and monitor your lab values. You Must read complete instructions/literature along with all the possible adverse reactions/side effects for all the Medicines you take and that have been prescribed to you. Take any new Medicines after you have completely understood and accept all the possible adverse reactions/side effects. Wear Seat belts while driving. If you have smoked or chewed Tobacco in the last 2 yrs please stop smoking and/or stop any Recreational drug use.   Increase activity slowly   Complete by:  As directed      Discharge Exam: Filed Weights   12/31/17 1458 12/31/17 2203  Weight: 88.9 kg 87.9 kg   Vitals:   01/04/18 0821 01/04/18 1251  BP:  140/72  Pulse:  72  Resp:  20  Temp:    SpO2: 95% 97%   General: Appear in mild distress, no Rash; Oral Mucosa moist. Cardiovascular: S1 and S2 Present, no Murmur, no JVD Respiratory: Bilateral Air entry present and no Crackles, bilateral  wheezes Abdomen: Bowel Sound present, Soft and no tenderness Extremities: no Pedal edema, no calf tenderness Neurology: Grossly no focal neuro deficit.  The results of significant  diagnostics from this hospitalization (including imaging, microbiology, ancillary and laboratory) are listed below for reference.    Significant Diagnostic Studies: Dg Chest 2 View  Result Date: 12/31/2017 CLINICAL DATA:  Shortness of breath, AFib EXAM: CHEST - 2 VIEW COMPARISON:  08/10/2017 FINDINGS: There is bilateral chronic interstitial thickening. The lungs are hyperinflated likely secondary to COPD. There is no focal consolidation. There is no pleural effusion or pneumothorax. The heart and mediastinal contours are unremarkable. There is no acute osseous abnormality. IMPRESSION: No active cardiopulmonary disease. Electronically Signed  By: Kathreen Devoid   On: 12/31/2017 15:28   [place] Wo Cm  Result Date: 12/31/2017 CLINICAL DATA:  Recurrent sinusitis EXAM: CT PARANASAL SINUS LIMITED WITHOUT CONTRAST TECHNIQUE: Non-contiguous multidetector CT images of the paranasal sinuses were obtained in a single plane without contrast. COMPARISON:  CT sinus 09/17/2017 FINDINGS: The paranasal sinuses are free of fluid.  No mucosal thickening. IMPRESSION: Clear paranasal sinuses. Electronically Signed   By: Ulyses Jarred M.D.   On: 12/31/2017 05:01    Microbiology: Recent Results (from the past 240 hour(s))  Blood Culture (routine x 2)     Status: None   Collection Time: 12/31/17  5:05 PM  Result Value Ref Range Status   Specimen Description BLOOD RIGHT ANTECUBITAL  Final   Special Requests   Final    BOTTLES DRAWN AEROBIC AND ANAEROBIC Blood Culture adequate volume   Culture   Final    NO GROWTH 5 DAYS Performed at [facility], [address], [place], [place] [address]    Report Status 01/05/2018 FINAL  Final  Blood Culture (routine x 2)     Status: None   Collection Time: 12/31/17  5:36 PM  Result Value Ref Range Status   Specimen Description BLOOD LEFT ANTECUBITAL  Final   Special Requests   Final    BOTTLES DRAWN AEROBIC AND ANAEROBIC Blood Culture adequate volume    Culture   Final    NO GROWTH 5 DAYS Performed at [facility], [place] [address], [place], [place] [address]    Report Status 01/05/2018 FINAL  Final  Respiratory Panel by PCR     Status: Abnormal   Collection Time: 12/31/17 10:55 PM  Result Value Ref Range Status   Adenovirus NOT DETECTED NOT DETECTED Final   Coronavirus 229E NOT DETECTED NOT DETECTED Final   Coronavirus HKU1 NOT DETECTED NOT DETECTED Final   Coronavirus NL63 NOT DETECTED NOT DETECTED Final   Coronavirus OC43 NOT DETECTED NOT DETECTED Final   Metapneumovirus NOT DETECTED NOT DETECTED Final   Rhinovirus / Enterovirus NOT DETECTED NOT DETECTED Final   Influenza A NOT DETECTED NOT DETECTED Final   Influenza A H1 NOT DETECTED NOT DETECTED Final   Influenza A H1 2009 NOT DETECTED NOT DETECTED Final   Influenza A H3 NOT DETECTED NOT DETECTED Final   Influenza B NOT DETECTED NOT DETECTED Final   Parainfluenza Virus 1 DETECTED (A) NOT DETECTED Final   Parainfluenza Virus 2 NOT DETECTED NOT DETECTED Final   Parainfluenza Virus 3 NOT DETECTED NOT DETECTED Final   Parainfluenza Virus 4 NOT DETECTED NOT DETECTED Final   Respiratory Syncytial Virus NOT DETECTED NOT DETECTED Final   Bordetella pertussis NOT DETECTED NOT DETECTED Final   Chlamydophila pneumoniae NOT DETECTED NOT DETECTED Final   Mycoplasma pneumoniae NOT DETECTED NOT DETECTED Final    Comment: Performed at [facility], [place] [address] [place], [place] [address]  MRSA PCR Screening     Status: None   Collection Time: 12/31/17 10:55 PM  Result Value Ref Range Status   MRSA by PCR NEGATIVE NEGATIVE Final    Comment:        The GeneXpert MRSA Assay (FDA approved for NASAL specimens only), is one component of a comprehensive MRSA colonization surveillance program. It is not intended to diagnose MRSA infection nor to guide or monitor treatment for MRSA infections. Performed at [facility], [place] [address], [place],  [address]  Labs: CBC: Recent Labs  Lab 12/31/17 1512 01/01/18 0322 01/02/18 0236 01/03/18 0324 01/04/18 0348  WBC 15.1* 11.0* 20.0* 23.9* 20.0*  HGB 15.4 14.9 14.6 14.7 14.0  HCT 51.0 48.5 48.2 46.8 45.9  MCV 91.1 89.6 88.9 89.8 89.6  PLT 233 224 270 294 094   Basic Metabolic Panel: Recent Labs  Lab 12/31/17 1700 01/01/18 0322 01/02/18 0236 01/03/18 0324 01/04/18 0348  NA 139 141 142 141 142  K 3.7 4.2 3.7 3.9 3.9  CL 104 106 110 109 108  CO2 27 26 23 24 26   GLUCOSE 113* 175* 181* 182* 108*  BUN 10 13 17 19 17   CREATININE 1.12 0.98 1.22 0.98 0.88  CALCIUM 9.2 9.0 9.3 9.4 9.2  MG  --   --  2.3  --   --    Liver Function Tests: Recent Labs  Lab 12/31/17 1700  AST 52*  ALT 52*  ALKPHOS 88  BILITOT 0.7  PROT 6.4*  ALBUMIN 3.1*   No results for input(s): LIPASE, AMYLASE in the last 168 hours. No results for input(s): AMMONIA in the last 168 hours. Cardiac Enzymes: No results for input(s): CKTOTAL, CKMB, CKMBINDEX, TROPONINI in the last 168 hours. BNP (last 3 results) Recent Labs    12/31/17 1512  BNP 152.0*   CBG: No results for input(s): GLUCAP in the last 168 hours. Time spent: 35 minutes  Signed:  Berle Mull  [facility] 01/04/2018   , 10:58 PM

## 2018-01-05 NOTE — Consult Note (Signed)
            [facility] CM Primary Care Navigator  01/05/2018  Derrick Moon 1940/08/17 [id number]   Went to seepatient at the bedside to identify possible discharge needsbut he was already discharged per staff. Patient went home with home health services.  Per MD note,patientpresented with shortness of breath, was found to have parainfluenza bronchitis causing COPD exacerbation with acute hypoxic respiratory failure.  Primary care provider's office is listed as providing transition of care (TOC) follow-up.   Patient has discharge instruction to follow-up withprimary care provider in 1 week.  Primary care provider's office called Kenney Houseman) to notify of discharge; need for post hospital follow-up and transition of care (TOC). Notified ofpatient'shealth issues needing close follow-up and made aware to refer patient to [facility] care management if deemed necessary and appropriate for any services.   For additional questions please contact:  Edwena Felty A. Era Parr, BSN, RN-BC [facility] PRIMARY CARE Navigator Cell: [phone]

## 2018-01-11 DIAGNOSIS — H6690 Otitis media, unspecified, unspecified ear: Secondary | ICD-10-CM | POA: Diagnosis not present

## 2018-01-11 DIAGNOSIS — J9601 Acute respiratory failure with hypoxia: Secondary | ICD-10-CM | POA: Diagnosis not present

## 2018-01-11 DIAGNOSIS — J204 Acute bronchitis due to parainfluenza virus: Secondary | ICD-10-CM | POA: Diagnosis not present

## 2018-01-11 DIAGNOSIS — I10 Essential (primary) hypertension: Secondary | ICD-10-CM | POA: Diagnosis not present

## 2018-02-02 ENCOUNTER — Encounter: Payer: Self-pay | Admitting: Internal Medicine

## 2018-02-02 ENCOUNTER — Ambulatory Visit: Payer: PPO | Admitting: Internal Medicine

## 2018-02-02 VITALS — BP 106/84 | HR 66 | Ht 71.0 in | Wt 200.0 lb

## 2018-02-02 DIAGNOSIS — J449 Chronic obstructive pulmonary disease, unspecified: Secondary | ICD-10-CM

## 2018-02-02 DIAGNOSIS — I10 Essential (primary) hypertension: Secondary | ICD-10-CM

## 2018-02-02 DIAGNOSIS — J9601 Acute respiratory failure with hypoxia: Secondary | ICD-10-CM

## 2018-02-02 MED ORDER — METHYLPREDNISOLONE ACETATE 80 MG/ML IJ SUSP
120.0000 mg | Freq: Once | INTRAMUSCULAR | Status: AC
  Administered 2018-02-02: 120 mg via INTRAMUSCULAR

## 2018-02-02 MED ORDER — AMOXICILLIN-POT CLAVULANATE 875-125 MG PO TABS: 1.0000 | ORAL_TABLET | Freq: Two times a day (BID) | ORAL | 0 refills | Status: AC

## 2018-02-02 MED ORDER — METOPROLOL TARTRATE 25 MG PO TABS: ORAL_TABLET | ORAL | Status: DC

## 2018-02-02 NOTE — Progress Notes (Signed)
Subjective:    Patient ID: Derrick Moon, male   DOB: [birth year]   MRN: [id number]    Brief patient profile:  77 yowm quit smoking cigarettes 1990 / still smoking some cigars    referred to pulmonary clinic 12/12/2015 by Dr   Willey Blade for sob s/p neg cards eval by Dr Dwana Curd (though ? If pt completed the myocardial perfusion study scheduled for 08/15/15) with COPD GOLD II dx      .History of Present Illness  12/12/2015 1st [facility] office visit/ W[facility]   Chief Complaint  Patient presents with  . Pulmonary Consult    Referred by Dr. [name]. Pt c/o dizziness with exertion for the past 6 months. His spouse states after he has been outside working, he comes in SOB.  He has prod cough with clear sputum in the am's.   variably noisy breathing x 6 months seems to bother wife more than husband  MMRC2 = can't walk a nl pace on a flat grade s sob but does fine slow and flat eg grocery shopping  Sleeping fine off cpap but wakes up every am with throat congestion/ uses halls  spiriva not helping / hfa may help/ prednisone does not help rec avapro  150 mg one daily - if light headed when you stand up break it in half  Stop spiriva and lisinopril and the halls for now GERD diet Only use your albuterol as a rescue medication      12/12/2016  f/u ov/Wert re: copd GOLD II/ symb 160 2bid and daily albuterol around 2 pm Chief Complaint  Patient presents with  . Follow-up    Pt states he is here for his annual followup.  He states his breathing is "pretty good".  He states he was seen by his PCP on 12/03/16 and was dxed with lung infection- txed with pred and doxy.  He has some cough with large amounts of clear sputum.  He is using his albuterol inhaler 1 x daily on average.   worse for several months cough/ congestion esp after supper, does ok at hs/noct  But wakes up most  ams with  One tbsp thick mucus while on symbicort rx pred/doxy > 50% improved / still smoking mostly cigars  Much better  breathing  indoors vs outdoors but still mmrc = 2  Needing benadryl to sleep due to sense of pnds   Needing sab at least once daily  rec Plan A = Automatic = symbicort 160 x2 puffs followed by spiriva 2 puffs first thing in am then 12 hours later 2 pffs symbicort only Work on inhaler technique:    Plan B = Backup Only use your albuterol as a rescue medication Please remember to go to the  x-ray department downstairs in the basement  for your tests - we will call you with the results when they are available. The key is to stop smoking completely before smoking completely stops you!       Admit date: 02/02/2017 Discharge date: 02/08/2017   Discharge Diagnoses:  Severe sepsis secondary to respiratory infection Acute on chronic respiratory failure with hypoxemia Atrial fibrillation with RVR, new Acute kidney injury Hypertension BPH Tobacco abuse Abnormal TSH/Hyperthyroidism       02/19/2017   Post hosp ext f/u ov/Wert re: transition of care / on 2lpm at hs/ one or two prednisone left in bottle  Chief Complaint  Patient presents with  . Follow-up    Breathing has improved some, but not  back to baseline yet. He states "I feel like I'm suffocating" with exertion and he can not lie down flat.   doe = MMRC2 = can't walk a nl pace on a flat grade s sob but does fine slow and flat eg [employer]  Sleeping in recliner on 3lpm maybe 30 degrees x years No need for saba since d/c but sore throat on both spiriva and bevespi  rec Goal is to keep your 02 sats above 90% at all times so continue 02 2lpm at bedtime and as needed during the day but you need to monitor it when you walk/ exert to be sure your saturations are adequate Pantoprazole (protonix) 40 mg   Take  30-60 min before first meal of the day and Pepcid (famotidine)  20 mg one @  bedtime until return to office - this is the best way to tell whether stomach acid is contributing to your problem.   GERD diet      05/11/2017  f/u  ov/Wert re:  COPD II / still smoking cigars  No meds in hand as req  Chief Complaint  Patient presents with  . Follow-up    Breathing is about the same.  He is coughing with clear sputum.  He is using his albuterol inhaler 2-3 x per wk on average. He stopped using his o2 approx 1 wk ago.   stopped 02 2 weeks prior to OV   Dyspnea:  Able to do yardwork slow pace = MMRC2 = can't walk a nl pace on a flat grade s sob but does fine slow and flat  Cough: sometimes p supper min mucoid Sleep: ok   rec Ok to stop 02  Plan A = Automatic = change bevespi to symbicort 160  Take 2 puffs first thing in am and then another 2 puffs about 12 hours later.  Work on inhaler technique:  relax and gently Plan B = Backup Only use your albuterol as a rescue medication      07/17/2017 acute extended ov/Wert re:  Copd II / symb 160 bid >> says no longer smoking / brought meds as req Chief Complaint  Patient presents with  . Acute Visit    Increased SOB for approx 2 wks- started after he was mowing for several hours.  He states he has had occ wheezing and congestion in his chest. He is coughing up some clear sputum.   prior to 07/07/17 was doing so much better s saba decided to work in the yard for 6 hours but  Noted need for saba while mowing and downhill since then with  cough/ wheezing sob worse in afternoons  And using saba 2 x 3 x daily at most Sleeps 1 pillow, no need for saba noct  rec Prednisone 10 mg take  4 each am x 2 days,   2 each am x 2 days,  1 each am x 2 days and stop  Plan A = Automatic = continue  symbicort 160  Take 2 puffs first thing in am and then another 2 puffs about 12 hours later and be sure you take your pepcid (famotidine) at bedtime  Work on inhaler technique:   Plan B = Backup Only use your albuterol as a rescue medication      08/10/2017  f/u ov/Wert re:  Copd II/ AB component / dog allergy  Chief Complaint  Patient presents with  . Follow-up    Breathing has improved  slightly. He states he feels  worse when he goes outside in the heat.   Dyspnea: MMRC2 = can't walk a nl pace on a flat grade s sob but does fine slow and flat  Cough: every noct arount 3-4 am cough congestion maybe a tsp of white mucus Typically after supper same problem with cough  Sleep: L side horizontal with dog/ coughs worse if faces dog side  SABA use:  Def helps / using it twice daily if goes outside  / not typically needing noct rec Add singulair (montelukast) 10 mg one daily at bedtime to see if helps night time cough but this is not an antedote to the dog you are sleeping with  Reduce the lopressor (metaprolol) to one half tablet twice daily  Change pepcid to take right after supper to see if helps the cough after supper  Continue symbicort 160(same as dulera 200)  Take 2 puffs first thing in am and then another 2 puffs about 12 hours later.  Work on inhaler technique:  relax and gently blow all the way out then take a nice smooth deep breath back in, triggering the inhaler at same time you start breathing in.  Hold for up to 5 seconds if you can. Blow out thru nose. Rinse and gargle with water when done   Sinus CT 08/19/17  L max sinusitis > augmentin x 20 days > cleared 09/17/17     12/22/2017  Extended  f/u ov/Wert re: GOLD II/III AB/ ? Bronchiectasis/ Sinusitis component refractory cough/ worse sob Chief Complaint  Patient presents with  . Follow-up    Breathing seems worse recently. He gets out of breath walking short distances such as from parking lot to building today.   Dyspnea:  [employer] pushing feels like need to stop and sit down before finish shopping  Cough: congested/ sinus drainage esp in am with variable yellow mucus/ using benadryl to control pnds  Sleeping: bed flat/ one pillow once a week wakes up due to cough  SABA use: up  3 x daily but never rechallenges  02: no rec Pace yourself but continue to be as active as you can  Only use your albuterol as a rescue  medication  Work on inhaler technique.     12/31/2017  Acute extended  ov/Wert re:  Aecopd/ RAF  Chief Complaint  Patient presents with  . Acute Visit    Increased SOB and cough over the past 3-4 days. He is coughing up some yellow sputum.   Dyspnea:  Worse x 3-4 days now across the room Cough: congested, rattling thick yellow, not using flutter  Sleeping: flat bed, one pillow used  SABA use: up to twice daily, not helping 02: no longer has it / says sats running in 80s for days rec   To ER   Date of admission: 12/31/2017             Date of discharge: 01/04/2018     Discharge Diagnoses:  Principal Problem:   COPD exacerbation ([facility])   Essential hypertension   PAF (paroxysmal atrial fibrillation) (HCC)   SIRS (systemic inflammatory response syndrome) (HCC)   Otitis media   Tobacco use   History of present illness: As per the H and P dictated on admission, "Stephane H Brinsonis a 11 y.o.malewith medical history significant ofPAFon Eliquis s/pTEE/DCCVfollowed by Dr.Koneswaren, AAA, COPDGold II, HTN, Bladder Cas/p resection,tobacco use, and BPH;who presents with complaints of progressively worsening shortness of breath over the last 3 to 4 days.Symptoms started with complaints  of nasal congestion postnasal drip and cough with large amounts of clear thick sputum. Associated symptoms included complaints of headache, bilateral ear pain, wheezing, and chills. He has been using his Symbicort and albuterol inhaler with temporary relief of symptoms. He became increasingly more winded even walking a few feet. At home his O2 saturations have been in the 80s for most of the week. He is not normally on oxygen at home. Denies having any chest pain, nausea, vomiting, diarrhea, dysuria, leg swelling, calf pain, or known recent sick contacts. Patient had just had a limited CT of the paranasal sinuses without contrast yesterday that revealed clear paranasal sinuses. He had followed up  at Dr. Melvyn Novas office of pulmonology today after being referred from Dr. Sydell Axon was noted to be in atrial fibrillation and was sent to the hospital for further evaluation. Patient apparently quit smoking cigarettes in 1990,but still smokes cigars. He previously was admitted into the hospital in 01/2017, after being found to be in A. fib with RVR secondary to atypical pneumonia and ultimately had to be sent home on oxygen although cultures did not grow out specific organism.    Hospital Course:  Summary of his active problems in the hospital is as following.   1. Acute hypoxic respiratory failure. Parainfluenza bronchitis. COPD exacerbation. Continue steroids, completed antibiotics. No growth on cultures. Continue nebulizers.  2. SIRS. Acute otitis media. On admission patient was found to have acute otitis media with SIRS-like picture. Procalcitonin is not significantly elevated. continue azithromycin for now.  3. Paroxysmal A. fib. On chronic anticoagulation. RVR overnight. IV Lopressor. Now better Continue Eliquis.  4. Mild elevation of AST ALT. Monitor outpatient.  5. Essential hypertension. Blood pressure significantly elevated. New medications added. Monitor.  All other chronic medical condition were stable during the hospitalization.  Patient was seen by physical therapy, who recommended home health, which was arranged by Education officer, museum and case Freight forwarder. On the day of the discharge the patient's vitals were stable , and no other acute medical condition were reported by patient. the patient was felt safe to be discharge at home with outpatient PT.    02/02/2018 extended f/u ov/Wert re:  Transition of care for GOLD II/III AB / 02 prn Chief Complaint  Patient presents with  . Follow-up    Sob is worse,coughing up yellow/green mucus.  Dyspnea: MMRC3 = can't walk 100 yards even at a slow pace at a flat grade s stopping due to sob  Doe fine leaning on  buggy off 02 [employer]  Cough: turned color x last few days prior to OV  /variable, not necessarily worse in am  Sleeping: on back/ flat bed / one pillow - sleeps in recliner once  A week  SABA use: twice a week  02: not using at hs  And rarely prn    No obvious day to day or daytime variability or assoc excess/ purulent sputum or mucus plugs or hemoptysis or cp or chest tightness, subjective wheeze or overt sinus or hb symptoms.   Sleeping as above  without nocturnal  or early am exacerbation  of respiratory  c/o's or need for noct saba. Also denies any obvious fluctuation of symptoms with weather or environmental changes or other aggravating or alleviating factors except as outlined above   No unusual exposure hx or h/o childhood pna/ asthma or knowledge of premature birth.  Current Allergies, Complete Past Medical History, Past Surgical History, Family History, and Social History were reviewed in [employer]  medical record.  ROS  The following are not active complaints unless bolded Hoarseness, sore throat, dysphagia, dental problems, itching, sneezing,  nasal congestion or discharge of excess mucus or purulent secretions, ear ache,   fever, chills, sweats, unintended wt loss or wt gain, classically pleuritic or exertional cp,  orthopnea pnd or arm/hand swelling  or leg swelling, presyncope, palpitations, abdominal pain, anorexia, nausea, vomiting, diarrhea  or change in bowel habits or change in bladder habits, change in stools or change in urine, dysuria, hematuria,  rash, arthralgias, visual complaints, headache, numbness, weakness or ataxia or problems with walking or coordination,  change in mood or  memory.        Current Meds  Medication Sig  . acetaminophen (TYLENOL) 500 MG tablet Take 1,000 mg by mouth every 6 (six) hours as needed (for pain or headaches).   [name] albuterol (PROVENTIL HFA;VENTOLIN HFA) 108 (90 BASE) MCG/ACT inhaler Inhale 1-2 puffs into the lungs every 6  (six) hours as needed for wheezing or shortness of breath. Reported on 10/03/2015  . amLODipine (NORVASC) 10 MG tablet Take 1 tablet (10 mg total) by mouth daily.  [name] apixaban (ELIQUIS) 5 MG TABS tablet Take 1 tablet (5 mg total) 2 (two) times daily by mouth.  . budesonide-formoterol (SYMBICORT) 160-4.5 MCG/ACT inhaler Inhale 2 puffs into the lungs 2 (two) times daily.  . diphenhydrAMINE (BENADRYL) 25 MG tablet Take 25 mg by mouth every 6 (six) hours as needed for itching or allergies.   [name] docusate sodium (COLACE) 100 MG capsule Take 1 capsule (100 mg total) by mouth 2 (two) times daily.  . famotidine (PEPCID) 20 MG tablet One at bedtime (Patient taking differently: Take 20 mg by mouth at bedtime. )  . hydrALAZINE (APRESOLINE) 50 MG tablet Take 1 tablet (50 mg total) by mouth 3 (three) times daily.  [name] ipratropium-albuterol (DUONEB) 0.5-2.5 (3) MG/3ML SOLN Take 3 mLs by nebulization every 4 (four) hours as needed.  . metoprolol tartrate (LOPRESSOR) 25 MG tablet One half twice daily  . montelukast (SINGULAIR) 10 MG tablet Take 1 tablet (10 mg total) by mouth at bedtime.  . pantoprazole (PROTONIX) 40 MG tablet Take 1 tablet (40 mg total) by mouth daily. (Patient taking differently: Take 40 mg by mouth daily before breakfast. )  . Tamsulosin HCl (FLOMAX) 0.4 MG CAPS Take 0.4 mg by mouth daily after supper.   . [ ]  guaifenesin (HUMIBID E) 400 MG TABS tablet Take 400 mg by mouth every 4 (four) hours.  .     .   metoprolol tartrate (LOPRESSOR) 25 MG tablet Take 1 tablet (25 mg total) 2 (two) times daily by mouth.                  Objective:   Physical Exam   amb wm nad   02/02/2018      200 12/31/2017      196  12/22/2017        200  09/21/2017          206  08/10/2017        202  07/17/2017       198  05/11/2017        198  03/30/2017          194  02/20/2017     189 02/02/2017      189  12/12/2016        201  01/09/2016      198   12/12/15 198 lb (89.8 kg)  11/09/15 201 lb 12.8 oz (91.5  kg)  10/03/15 199 lb (90.3 kg)      Vital signs reviewed - Note on arrival 02 sats  91% on RA      HEENT: nl dentition / oropharynx. Nl external ear canals without cough reflex -  Mild bilateral non-specific turbinate edema     NECK :  without JVD/Nodes/TM/ nl carotid upstrokes bilaterally   LUNGS: no acc muscle use,  Mod barrel  contour chest wall with bilateral  Insp/exp rhonchi    With  cough on  exp maneuvers and mod  Hyperresonant  to  percussion bilaterally     CV:  RRR  no s3 or murmur or increase in P2, and no edema   ABD:  soft and nontender with pos mid insp Hoover's  in the supine position. No bruits or organomegaly appreciated, bowel sounds nl  MS:   Nl gait/  ext warm without deformities, calf tenderness, cyanosis or clubbing No obvious joint restrictions   SKIN: warm and dry without lesions    NEURO:  alert, approp, nl sensorium with  no motor or cerebellar deficits apparent.            I personally reviewed images and agree with radiology impression as follows:  CXR:   12/31/17 No active cardiopulmonary disease.              Assessment:

## 2018-02-02 NOTE — Patient Instructions (Addendum)
Augmentin 875 mg take one pill twice daily  X 10 days - take at breakfast and supper with large glass of water.  It would help reduce the usual side effects (diarrhea and yeast infections) if you ate cultured yogurt at lunch.   depomedrol 120 mg IM today   Reduce lopressor  25mg  To 12.5mg   twice  daily (so break the 25 mg  in half)   For cough mucinex or mucinex dm up to 1200 mg every 12 hours as needed with flutter valve as much as possible   For sats below 90% with activity, use  02 2lpm with activity but otherwise no need  Please schedule a follow up office visit in 4 weeks, sooner if needed  with all medications /inhalers/ solutions in hand so we can verify exactly what you are taking. This includes all medications from all doctors and over the counters  - PFT's on return

## 2018-02-03 ENCOUNTER — Telehealth: Payer: Self-pay | Admitting: *Deleted

## 2018-02-03 ENCOUNTER — Encounter: Payer: Self-pay | Admitting: Internal Medicine

## 2018-02-03 MED ORDER — BUDESONIDE-FORMOTEROL FUMARATE 160-4.5 MCG/ACT IN AERO: INHALATION_SPRAY | RESPIRATORY_TRACT | Status: DC

## 2018-02-03 NOTE — Addendum Note (Signed)
Addended by: Christinia Gully B on: 02/03/2018 05:21 AM   Modules accepted: Orders

## 2018-02-03 NOTE — Assessment & Plan Note (Addendum)
05/11/2017  Walked RA x 3 laps @ 185 ft each stopped due to  End of study, nl pace, no sob or desat    - d/c 02 05/11/2017  - discharged on 02 again 12/31/17  But not using   - 02/02/2018   Walked RA  2 laps @ 185 ft each stopped due to  Sob and legs tired but no desat semi-fast pace so rec 02 2lpm just with heavy exertion

## 2018-02-03 NOTE — Assessment & Plan Note (Addendum)
-   PFTs  05/27/12   FEV1  2.04(62%) ratio 54  - 12/12/2015 try off spiriva since pt says not working > increased need for saba - Spirometry 01/09/2016  FEV1 1.19 (47%)  Ratio 50 p nothing prior and classic curvature on f/v loop - 01/09/2016  After extensive coaching HFA effectiveness =    75% try symbicort 160 2bid   - 12/12/2016  After extensive coaching devie effectiveness =    75% with smi > add spiriva 2 each am  - Spirometry 01/08/2017  FEV1 1.52 (48%)  Ratio 59    - 01/08/2017 trial of bevespi   - 05/11/2017    Changed  back to symb from bevespi -pt preferred  - Spirometry 08/10/2017  FEV1 1.54 (49%)  Ratio 64    - 12/22/2017  After extensive coaching inhaler device,  effectiveness =    75% (short Ti) - 12/22/2017  Walked RA x 3 laps @ 185 ft each stopped due to  End of study, moderately fast pace, sat 89% but    knees/back gave out > sob  -  Alpha one AT screen 12/22/2017  SS  Level 109    DDX of  difficult airways management almost all start with A and  include Adherence, Ace Inhibitors, Acid Reflux, Active Sinus Disease, Alpha 1 Antitripsin deficiency, Anxiety masquerading as Airways dz,  ABPA,  Allergy(esp in young), Aspiration (esp in elderly), Adverse effects of meds,  Active smoking or vaping, A bunch of PE's (a small clot burden can't cause this syndrome unless there is already severe underlying pulm or vascular dz with poor reserve) plus two Bs  = Bronchiectasis and Beta blocker use..and one C= CHF   Adherence is always the initial "prime suspect" and is a multilayered concern that requires a "trust but verify" approach in every patient - starting with knowing how to use medications, especially inhalers, correctly, keeping up with refills and understanding the fundamental difference between maintenance and prns vs those medications only taken for a very short course and then stopped and not refilled.  - 02/02/2018  After extensive coaching inhaler device,  effectiveness =    75% >  Continue  symb 160 2bid and duoneb is prn   ? Active smoking > admit notes say still smoking cigars > discouraged strongly   ? Acid (or non-acid) GERD > always difficult to exclude as up to 75% of pts in some series report no assoc GI/ Heartburn symptoms> rec continue max (24h)  acid suppression and diet restrictions/ reviewed     ? Active sinus dz > rx augmentin x 10 day  ? Allergy / asthma >  depomedrol 120 mg IM and continue singulair  ? Anxiety/depression  > usually at the bottom of this list of usual suspects but should be much higher on this pt's based on H and P and may interfere with adherence and also interpretation of response or lack thereof to symptom management which can be quite subjective.  ?  A bunch of PE's > on eliquis  ? BB effects > see essential hbp  ? Bronchiectasis > use fluttter, consider HRCT next if not improving   ? CHF   >   bnp only 152 with RAF last admit so doubt

## 2018-02-03 NOTE — Assessment & Plan Note (Addendum)
Trial off acei 12/12/2015 >> improved symptoms as of 01/09/2016 > stay off acei indefinitely  - restarted acei at d/c 12/31/17 but d/c'd by Dr Willey Blade for low bp at post hosp f/u   Re acei, agree with Dr Willey Blade, Although even in retrospect it may not be clear the ACEi contributed to the pt's symptoms,  Pt improved off them and adding them back at this point or in the future would risk confusion in interpretation of non-specific respiratory symptoms to which this patient is prone  ie  Better not to muddy the waters here.   bp still too low today with relatively low pulse on lopressor which is not the best choice either given AB component > will try 12.5 mg bid but if pulse /afib recurrent on lower dose lopressor In the setting of respiratory symptoms of unknown etiology,  It would be preferable to use bisoprolol a trial basis, to make sure the spillover Beta 2 effects of the less specific Beta blockers are not contributing to this patient's symptoms.   If bp too low on bisoprolol 5 mg daily then reduce/wean off amlodipine next     I had an extended discussion with the patient reviewing all relevant studies completed to date and  lasting 25 minutes of a 40  minute transition of care office visit addressing reccurrent severe non-specific but potentially very serious refractory respiratory symptoms of uncertain and potentially multiple  etiologies.   Each maintenance medication was reviewed in detail including most importantly the difference between maintenance and prns and under what circumstances the prns are to be triggered using an action plan format that is not reflected in the computer generated alphabetically organized AVS.    Please see AVS for specific instructions unique to this office visit that I personally wrote and verbalized to the the pt in detail and then reviewed with pt  by my nurse highlighting any changes in therapy/plan of care  recommended at today's visit.      See device teaching  which extended face to face time for this visit

## 2018-02-03 NOTE — Telephone Encounter (Signed)
-----   Message from Tanda Rockers, MD sent at 02/03/2018  5:20 AM EST ----- Cancel the call re symbicort, it was there on the list Let him know if still running low bp next step is reduce /wean off amlodipine (one half daily then stop if still too low)

## 2018-02-03 NOTE — Telephone Encounter (Signed)
Called and spoke with the pt's spouse and notified of recs per MW  She verbalized understanding and states will inform the pt

## 2018-02-04 ENCOUNTER — Encounter: Payer: Self-pay | Admitting: Cardiovascular Disease

## 2018-02-04 ENCOUNTER — Ambulatory Visit: Payer: PPO | Admitting: Cardiovascular Disease

## 2018-02-04 VITALS — BP 124/74 | HR 58 | Ht 71.0 in | Wt 195.0 lb

## 2018-02-04 DIAGNOSIS — I739 Peripheral vascular disease, unspecified: Secondary | ICD-10-CM

## 2018-02-04 DIAGNOSIS — I2583 Coronary atherosclerosis due to lipid rich plaque: Secondary | ICD-10-CM | POA: Diagnosis not present

## 2018-02-04 DIAGNOSIS — R7989 Other specified abnormal findings of blood chemistry: Secondary | ICD-10-CM | POA: Diagnosis not present

## 2018-02-04 DIAGNOSIS — I480 Paroxysmal atrial fibrillation: Secondary | ICD-10-CM | POA: Diagnosis not present

## 2018-02-04 DIAGNOSIS — I2510 Atherosclerotic heart disease of native coronary artery without angina pectoris: Secondary | ICD-10-CM

## 2018-02-04 DIAGNOSIS — R0602 Shortness of breath: Secondary | ICD-10-CM

## 2018-02-04 DIAGNOSIS — J449 Chronic obstructive pulmonary disease, unspecified: Secondary | ICD-10-CM

## 2018-02-04 DIAGNOSIS — I10 Essential (primary) hypertension: Secondary | ICD-10-CM

## 2018-02-04 DIAGNOSIS — J479 Bronchiectasis, uncomplicated: Secondary | ICD-10-CM | POA: Diagnosis not present

## 2018-02-04 DIAGNOSIS — J9620 Acute and chronic respiratory failure, unspecified whether with hypoxia or hypercapnia: Secondary | ICD-10-CM | POA: Diagnosis not present

## 2018-02-04 DIAGNOSIS — J441 Chronic obstructive pulmonary disease with (acute) exacerbation: Secondary | ICD-10-CM | POA: Diagnosis not present

## 2018-02-04 NOTE — Progress Notes (Signed)
SUBJECTIVE: The patient presents to reestablish care in our [place] office.  I evaluated him once before in May 2017 in preoperative consultation prior to undergoing surgery for peripheral vascular disease.  Additional past medical history includes paroxysmal atrial fibrillation (requiring direct-current cardioversion on 02/06/2017), hypertension, hyperthyroidism, nonobstructive coronary artery disease, and obstructive sleep apnea.  TEE on 02/06/2017 demonstrated normal left ventricular systolic function, LVEF 55 to 60%.  There was moderate left atrial dilatation by TTE on 02/04/2017.  Most recent TSH low at 0.203 on 12/31/2017.  However, free T4 was normal at 1.3 on 01/01/2018.  He was hospitalized for a COPD exacerbation in October 2019.  He is followed by pulmonary for his COPD most recently seen in the office on 02/02/2018.  He underwent coronary CT angiography on 04/17/2017 which demonstrated nonobstructive coronary artery disease.  There was less than 30% proximal LAD stenosis, mid LAD stenosis of 50%, and less than 30% distal LAD stenosis.  There was less than 30% RCA stenosis.  He smoked cigarettes for over 50 years and then began smoking cigars.  He now smokes about 1 cigar a month.  He recently received a steroid injection at his pulmonologist's office and is also on antibiotics.  He is frustrated by his shortness of breath.  [place] and has been active all of his life until the past 3 years.  He denies chest pain and palpitations.    Review of Systems: As per "subjective", otherwise negative.  Allergies  Allergen Reactions  . Adhesive [Tape] Other (See Comments)    SKIN IS VERY THIN AND IT TEARS VERY EASILY!!! Please use an alternative  . Aspartame And Phenylalanine Nausea Only  . Other Rash    Pt reports rash on back, buttocks, shoulders and stomach after arteriogram  . Sulfonamide Derivatives Nausea And Vomiting    Current Outpatient  Medications  Medication Sig Dispense Refill  . acetaminophen (TYLENOL) 500 MG tablet Take 1,000 mg by mouth every 6 (six) hours as needed (for pain or headaches).     [name] albuterol (PROVENTIL HFA;VENTOLIN HFA) 108 (90 BASE) MCG/ACT inhaler Inhale 1-2 puffs into the lungs every 6 (six) hours as needed for wheezing or shortness of breath. Reported on 10/03/2015    . amLODipine (NORVASC) 10 MG tablet Take 1 tablet (10 mg total) by mouth daily. 30 tablet 0  . amoxicillin-clavulanate (AUGMENTIN) 875-125 MG tablet Take 1 tablet by mouth 2 (two) times daily for 10 days. 20 tablet 0  . apixaban (ELIQUIS) 5 MG TABS tablet Take 1 tablet (5 mg total) 2 (two) times daily by mouth. 60 tablet 0  . budesonide-formoterol (SYMBICORT) 160-4.5 MCG/ACT inhaler Inhale 2 puffs into the lungs 2 (two) times daily. 1 Inhaler 0  . diphenhydrAMINE (BENADRYL) 25 MG tablet Take 25 mg by mouth every 6 (six) hours as needed for itching or allergies.     [name] docusate sodium (COLACE) 100 MG capsule Take 1 capsule (100 mg total) by mouth 2 (two) times daily. 10 capsule 0  . famotidine (PEPCID) 20 MG tablet One at bedtime (Patient taking differently: Take 20 mg by mouth at bedtime. ) 30 tablet 11  . ipratropium-albuterol (DUONEB) 0.5-2.5 (3) MG/3ML SOLN Take 3 mLs by nebulization every 4 (four) hours as needed. 360 mL 0  . metoprolol tartrate (LOPRESSOR) 25 MG tablet One half twice daily    . montelukast (SINGULAIR) 10 MG tablet Take 1 tablet (10 mg total) by mouth at bedtime. [place]  tablet 11  . pantoprazole (PROTONIX) 40 MG tablet Take 1 tablet (40 mg total) by mouth daily. (Patient taking differently: Take 40 mg by mouth daily before breakfast. ) 30 tablet 11  . Tamsulosin HCl (FLOMAX) 0.4 MG CAPS Take 0.4 mg by mouth daily after supper.      No current facility-administered medications for this visit.     Past Medical History:  Diagnosis Date  . AAA (abdominal aortic aneurysm) ([place])   . Anxiety    due to pain in leg  . Arthritis      Osteo  . Bell's palsy   . Cancer [facility])    Bladder cancer  . Cataracts, bilateral   . COPD (chronic obstructive pulmonary disease) ([facility])    Albuterol inhaler as needed  . Diverticulitis   . Enlarged prostate    takes Flomax daily  . GERD (gastroesophageal reflux disease)    was on Prilosec but has been off for yrs-no issues now with reflux  . Headache    migraines years ago  . History of bronchitis    > 4yrs ago  . History of colon polyps    benign  . History of dizziness    several yrs ago-was on Meclizine but has been off for several yrs-no problems since  . History of hiatal hernia   . Hypertension    takes Amlodipine and Lisinpril daily  . Joint pain   . Joint swelling   . Nocturia   . PAD (peripheral artery disease) ([facility])     bilateral superficial femoral artery occlusions   . Paroxysmal atrial fibrillation ([facility])   . Pneumonia    several times with most recent within 5 yrs ago  . Restless leg syndrome   . Rupture of left quadriceps tendon   . Shortness of breath dyspnea    with exertion  . Sleep apnea    does not use cpap  . Urinary urgency     Past Surgical History:  Procedure Laterality Date  . BICEPS TENDON REPAIR     LEFT  . CARDIOVERSION N/A 02/06/2017   Procedure: CARDIOVERSION;  Surgeon: Jerline Pain, MD;  Location[facility] ENDOSCOPY;  Service: Cardiovascular;  Laterality: N/A;  . CARPAL TUNNEL RELEASE Right 2014  . CHOLECYSTECTOMY  July 16,2010  . COLONOSCOPY    . CYSTOSCOPY    . ESOPHAGOGASTRODUODENOSCOPY    . FEMORAL-POPLITEAL BYPASS GRAFT Right 09/19/2015   Procedure: RIGHT COMMON FEMORAL-ABOVE KNEE POPLITEAL ARTERY    BYPASS GRAFT ;  Surgeon: Conrad [place]chard, MD;  Location: [place];  Service: Vascular;  Laterality: Right;  . FOOT SURGERY Right    x 5t  . HAND SURGERY Right   . HERNIA REPAIR Bilateral    inguinal  . PERIPHERAL VASCULAR CATHETERIZATION N/A 08/02/2015   Procedure: Abdominal Aortogram w/Lower Extremity;  Surgeon: Conrad Dodge, MD;   Location: [place] CV LAB;  Service: Cardiovascular;  Laterality: N/A;  . QUADRICEPS TENDON REPAIR Left 08/22/2016   Procedure: LEFT OPEN QUADRICEP TENDON REPAIR;  Surgeon: Netta Cedars, MD;  Location: [place];  Service: Orthopedics;  Laterality: Left;  . REVERSE SHOULDER ARTHROPLASTY Right 07/07/2014   Procedure: RIGHT REVERSE TOTAL SHOULDER ;  Surgeon: Netta Cedars, MD;  Location: [place];  Service: Orthopedics;  Laterality: Right;  . REVERSE TOTAL SHOULDER ARTHROPLASTY Right 07/07/2014   Dr Veverly Fells  . ROTATOR CUFF REPAIR     Bilateral   . TEE WITHOUT CARDIOVERSION N/A 02/06/2017   Procedure: TRANSESOPHAGEAL ECHOCARDIOGRAM (TEE);  Surgeon:  Jerline Pain, MD;  Location: [facility] ENDOSCOPY;  Service: Cardiovascular;  Laterality: N/A;  . TONSILLECTOMY    . tumor removed from bladder    . VEIN HARVEST Right 09/19/2015   Procedure: WITH NON REVERSE RIGHT GREATER SAPHENOUS VEIN HARVEST;  Surgeon: Conrad , MD;  Location: [place];  Service: Vascular;  Laterality: Right;    Social History   Socioeconomic History  . Marital status: Married    Spouse name: Not on file  . Number of children: Not on file  . Years of education: Not on file  . Highest education level: Not on file  Occupational History  . Not on file  Social Needs  . Financial resource strain: Not on file  . Food insecurity:    Worry: Not on file    Inability: Not on file  . Transportation needs:    Medical: Not on file    Non-medical: Not on file  Tobacco Use  . Smoking status: Current Some Day Smoker    Packs/day: 1.00    Years: 45.00    Pack years: 45.00    Types: Cigars  . Smokeless tobacco: Never Used  Substance and Sexual Activity  . Alcohol use: Yes    Alcohol/week: 0.0 standard drinks    Comment: wine occasionally  . Drug use: Yes    Types: Marijuana    Comment: rare; last use 2017  . Sexual activity: Yes  Lifestyle  . Physical activity:    Days per week: Not on file    Minutes per session: Not on file  .  Stress: Not on file  Relationships  . Social connections:    Talks on phone: Not on file    Gets together: Not on file    Attends religious service: Not on file    Active member of club or organization: Not on file    Attends meetings of clubs or organizations: Not on file    Relationship status: Not on file  . Intimate partner violence:    Fear of current or ex partner: Not on file    Emotionally abused: Not on file    Physically abused: Not on file    Forced sexual activity: Not on file  Other Topics Concern  . Not on file  Social History Narrative   Pt lives w/ wife in [place], [place]:   02/04/18 1258  BP: 124/74  Pulse: (!) 58  SpO2: 95%  Weight: 195 lb (88.5 kg)  Height: 5\' 11"  (1.803 m)    Wt Readings from Last 3 Encounters:  02/04/18 195 lb (88.5 kg)  02/02/18 200 lb (90.7 kg)  12/31/17 193 lb 12.6 oz (87.9 kg)     PHYSICAL EXAM General: NAD HEENT: Normal. Neck: No JVD, no thyromegaly. Lungs: Diminished sounds throughout, no crackles or wheezes. CV: Regular rate and rhythm, normal S1/S2, no S3/S4, no murmur. No pretibial or periankle edema.  Abdomen: Soft, nontender, no distention.  Neurologic: Alert and oriented.  Psych: Normal affect. Skin: Normal. Musculoskeletal: No gross deformities.    ECG: Reviewed above under Subjective   Labs: Lab Results  Component Value Date/Time   K 3.9 01/04/2018 03:48 AM   BUN 17 01/04/2018 03:48 AM   CREATININE 0.88 01/04/2018 03:48 AM   ALT 52 (H) 12/31/2017 05:00 PM   TSH 0.203 (L) 12/31/2017 10:15 PM   TSH 0.41 05/07/2017 10:13 AM   HGB 14.0 01/04/2018 03:48 AM     Lipids: Lab Results  Component Value Date/Time   LDLCALC  06/29/2008 02:00 AM    99        Total Cholesterol/HDL:CHD Risk Coronary Heart Disease Risk Table                     Men   Women  1/2 Average Risk   3.4   3.3  Average Risk       5.0   4.4  2 X Average Risk   9.6   7.1  3 X Average Risk  23.4   11.0        Use the  calculated Patient Ratio above and the CHD Risk Table to determine the patient's CHD Risk.        ATP III CLASSIFICATION (LDL):  <100     mg/dL   Optimal  100-129  mg/dL   Near or Above                    Optimal  130-159  mg/dL   Borderline  160-189  mg/dL   High  >190     mg/dL   Very High   CHOL  06/29/2008 02:00 AM    153        ATP III CLASSIFICATION:  <200     mg/dL   Desirable  200-239  mg/dL   Borderline High  >=240    mg/dL   High          TRIG 123 06/29/2008 02:00 AM   HDL 29 (L) 06/29/2008 02:00 AM       ASSESSMENT AND PLAN:  1.  Paroxysmal atrial fibrillation: Symptomatically stable.  Anticoagulated with apixaban.  Currently on Lopressor 12.5 mg twice daily.  No changes to therapy.  2.  Exertional dyspnea/COPD: He has a long history of tobacco abuse but has since quit.  He has COPD.  He is followed by pulmonology.  3.  Low TSH: Labs reviewed above with low TSH and normal free T4 in October 2019.  Followed by endocrinology in [place].  4.  Hypertension: Controlled on present therapy.  No changes.  5.  Peripheral arterial disease: Not on aspirin as he is on Eliquis.  He would benefit from statin therapy.  6.  Coronary artery disease: This is nonobstructive with coronary CT angiography results detailed above.   Disposition: Follow up 1 year   Time spent: 40 minutes, of which greater than 50% was spent reviewing symptoms, relevant blood tests and studies, and discussing management plan with the patient.    Kate Sable, M.D., F.A.C.C.

## 2018-02-04 NOTE — Patient Instructions (Signed)
Medication Instructions:  Your physician recommends that you continue on your current medications as directed. Please refer to the Current Medication list given to you today.  If you need a refill on your cardiac medications before your next appointment, please call your pharmacy.   Lab work: none If you have labs (blood work) drawn today and your tests are completely normal, you will receive your results only by: [name] MyChart Message (if you have MyChart) OR . A paper copy in the mail If you have any lab test that is abnormal or we need to change your treatment, we will call you to review the results.  Testing/Procedures: none  Follow-Up: At [facility], you and your health needs are our priority.  As part of our continuing mission to provide you with exceptional heart care, we have created designated Provider Care Teams.  These Care Teams include your primary Cardiologist (physician) and Advanced Practice Providers (APPs -  Physician Assistants and Nurse Practitioners) who all work together to provide you with the care you need, when you need it. You will need a follow up appointment in 12 months.  Please call our office 2 months in advance to schedule this appointment.  You may see Kate Sable, MD or one of the following Advanced Practice Providers on your designated Care Team:   Bernerd Pho, PA-C [facility]) . Ermalinda Barrios, PA-C ([place])  Any Other Special Instructions Will Be Listed Below (If Applicable). None

## 2018-02-05 DIAGNOSIS — J441 Chronic obstructive pulmonary disease with (acute) exacerbation: Secondary | ICD-10-CM | POA: Diagnosis not present

## 2018-02-05 DIAGNOSIS — R0602 Shortness of breath: Secondary | ICD-10-CM | POA: Diagnosis not present

## 2018-02-05 DIAGNOSIS — J479 Bronchiectasis, uncomplicated: Secondary | ICD-10-CM | POA: Diagnosis not present

## 2018-02-05 DIAGNOSIS — J9620 Acute and chronic respiratory failure, unspecified whether with hypoxia or hypercapnia: Secondary | ICD-10-CM | POA: Diagnosis not present

## 2018-02-23 DIAGNOSIS — L851 Acquired keratosis [keratoderma] palmaris et plantaris: Secondary | ICD-10-CM | POA: Diagnosis not present

## 2018-02-23 DIAGNOSIS — B351 Tinea unguium: Secondary | ICD-10-CM | POA: Diagnosis not present

## 2018-02-23 DIAGNOSIS — I739 Peripheral vascular disease, unspecified: Secondary | ICD-10-CM | POA: Diagnosis not present

## 2018-03-06 DIAGNOSIS — J479 Bronchiectasis, uncomplicated: Secondary | ICD-10-CM | POA: Diagnosis not present

## 2018-03-06 DIAGNOSIS — J9620 Acute and chronic respiratory failure, unspecified whether with hypoxia or hypercapnia: Secondary | ICD-10-CM | POA: Diagnosis not present

## 2018-03-06 DIAGNOSIS — R0602 Shortness of breath: Secondary | ICD-10-CM | POA: Diagnosis not present

## 2018-03-06 DIAGNOSIS — J441 Chronic obstructive pulmonary disease with (acute) exacerbation: Secondary | ICD-10-CM | POA: Diagnosis not present

## 2018-03-07 DIAGNOSIS — J441 Chronic obstructive pulmonary disease with (acute) exacerbation: Secondary | ICD-10-CM | POA: Diagnosis not present

## 2018-03-07 DIAGNOSIS — J9620 Acute and chronic respiratory failure, unspecified whether with hypoxia or hypercapnia: Secondary | ICD-10-CM | POA: Diagnosis not present

## 2018-03-07 DIAGNOSIS — J479 Bronchiectasis, uncomplicated: Secondary | ICD-10-CM | POA: Diagnosis not present

## 2018-03-07 DIAGNOSIS — R0602 Shortness of breath: Secondary | ICD-10-CM | POA: Diagnosis not present

## 2018-03-10 DIAGNOSIS — I10 Essential (primary) hypertension: Secondary | ICD-10-CM | POA: Diagnosis not present

## 2018-03-10 DIAGNOSIS — I480 Paroxysmal atrial fibrillation: Secondary | ICD-10-CM | POA: Diagnosis not present

## 2018-03-10 DIAGNOSIS — J449 Chronic obstructive pulmonary disease, unspecified: Secondary | ICD-10-CM | POA: Diagnosis not present

## 2018-03-12 ENCOUNTER — Ambulatory Visit: Payer: PPO | Admitting: Internal Medicine

## 2018-03-12 ENCOUNTER — Encounter: Payer: Self-pay | Admitting: Internal Medicine

## 2018-03-12 ENCOUNTER — Ambulatory Visit (INDEPENDENT_AMBULATORY_CARE_PROVIDER_SITE_OTHER): Payer: PPO | Admitting: Internal Medicine

## 2018-03-12 VITALS — BP 122/68 | HR 63 | Ht 71.0 in | Wt 198.0 lb

## 2018-03-12 DIAGNOSIS — J449 Chronic obstructive pulmonary disease, unspecified: Secondary | ICD-10-CM

## 2018-03-12 DIAGNOSIS — J9601 Acute respiratory failure with hypoxia: Secondary | ICD-10-CM

## 2018-03-12 LAB — PULMONARY FUNCTION TEST
DL/VA % pred: 50 %
DL/VA: 2.32 ml/min/mmHg/L
DLCO UNC % PRED: 42 %
DLCO unc: 14.19 ml/min/mmHg
FEF 25-75 Post: 1.26 L/sec
FEF 25-75 Pre: 0.86 L/sec
FEF2575-%CHANGE-POST: 46 %
FEF2575-%PRED-POST: 57 %
FEF2575-%Pred-Pre: 39 %
FEV1-%CHANGE-POST: 13 %
FEV1-%PRED-PRE: 58 %
FEV1-%Pred-Post: 66 %
FEV1-Post: 2.05 L
FEV1-Pre: 1.81 L
FEV1FVC-%CHANGE-POST: -4 %
FEV1FVC-%Pred-Pre: 81 %
FEV6-%Change-Post: 19 %
FEV6-%Pred-Post: 89 %
FEV6-%Pred-Pre: 74 %
FEV6-PRE: 3 L
FEV6-Post: 3.58 L
FEV6FVC-%Change-Post: 0 %
FEV6FVC-%Pred-Post: 104 %
FEV6FVC-%Pred-Pre: 103 %
FVC-%CHANGE-POST: 18 %
FVC-%PRED-POST: 85 %
FVC-%Pred-Pre: 72 %
FVC-Post: 3.66 L
FVC-Pre: 3.08 L
POST FEV1/FVC RATIO: 56 %
PRE FEV6/FVC RATIO: 97 %
Post FEV6/FVC ratio: 98 %
Pre FEV1/FVC ratio: 59 %
RV % PRED: 178 %
RV: 4.75 L
TLC % pred: 109 %
TLC: 7.95 L

## 2018-03-12 NOTE — Progress Notes (Signed)
Subjective:    Patient ID: Derrick Moon, male   DOB: 1941/01/17   MRN: [id number]    Brief patient profile:  79 yowm quit smoking cigarettes 1990 / still smoking some cigars    referred to pulmonary clinic 12/12/2015 by Dr   Willey Blade for sob s/p neg cards eval by Dr Dwana Curd (though ? If pt completed the myocardial perfusion study scheduled for 08/15/15) with COPD GOLD II dx      History of Present Illness  12/12/2015 1st [facility] office visit/ Derrick Moon   Chief Complaint  Patient presents with  . Pulmonary Consult    Referred by Dr. [name]. Pt c/o dizziness with exertion for the past 6 months. His spouse states after he has been outside working, he comes in SOB.  He has prod cough with clear sputum in the am's.   variably noisy breathing x 6 months seems to bother wife more than husband  MMRC2 = can't walk a nl pace on a flat grade s sob but does fine slow and flat eg grocery shopping  Sleeping fine off cpap but wakes up every am with throat congestion/ uses halls  spiriva not helping / hfa may help/ prednisone does not help rec avapro  150 mg one daily - if light headed when you stand up break it in half  Stop spiriva and lisinopril and the halls for now GERD diet Only use your albuterol as a rescue medication      12/12/2016  f/u ov/Derrick Moon re: copd GOLD II/ symb 160 2bid and daily albuterol around 2 pm Chief Complaint  Patient presents with  . Follow-up    Pt states he is here for his annual followup.  He states his breathing is "pretty good".  He states he was seen by his PCP on 12/03/16 and was dxed with lung infection- txed with pred and doxy.  He has some cough with large amounts of clear sputum.  He is using his albuterol inhaler 1 x daily on average.   worse for several months cough/ congestion esp after supper, does ok at hs/noct  But wakes up most  ams with  One tbsp thick mucus while on symbicort rx pred/doxy > 50% improved / still smoking mostly cigars  Much better  breathing  indoors vs outdoors but still mmrc = 2  Needing benadryl to sleep due to sense of pnds   Needing sab at least once daily  rec Plan A = Automatic = symbicort 160 x2 puffs followed by spiriva 2 puffs first thing in am then 12 hours later 2 pffs symbicort only Work on inhaler technique:    Plan B = Backup Only use your albuterol as a rescue medication Please remember to go to the  x-ray department downstairs in the basement  for your tests - we will call you with the results when they are available. The key is to stop smoking completely before smoking completely stops you!       Admit date: 02/02/2017 Discharge date: 02/08/2017   Discharge Diagnoses:  Severe sepsis secondary to respiratory infection Acute on chronic respiratory failure with hypoxemia Atrial fibrillation with RVR, new Acute kidney injury Hypertension BPH Tobacco abuse Abnormal TSH/Hyperthyroidism       02/19/2017   Post hosp ext f/u ov/Derrick Moon re: transition of care / on 2lpm at hs/ one or two prednisone left in bottle  Chief Complaint  Patient presents with  . Follow-up    Breathing has improved some, but not  back to baseline yet. He states "I feel like I'm suffocating" with exertion and he can not lie down flat.   doe = MMRC2 = can't walk a nl pace on a flat grade s sob but does fine slow and flat eg [employer]  Sleeping in recliner on 3lpm maybe 30 degrees x years No need for saba since d/c but sore throat on both spiriva and bevespi  rec Goal is to keep your 02 sats above 90% at all times so continue 02 2lpm at bedtime and as needed during the day but you need to monitor it when you walk/ exert to be sure your saturations are adequate Pantoprazole (protonix) 40 mg   Take  30-60 min before first meal of the day and Pepcid (famotidine)  20 mg one @  bedtime until return to office - this is the best way to tell whether stomach acid is contributing to your problem.   GERD diet      05/11/2017  f/u  ov/Derrick Moon re:  COPD II / still smoking cigars  No meds in hand as req  Chief Complaint  Patient presents with  . Follow-up    Breathing is about the same.  He is coughing with clear sputum.  He is using his albuterol inhaler 2-3 x per wk on average. He stopped using his o2 approx 1 wk ago.   stopped 02 2 weeks prior to OV   Dyspnea:  Able to do yardwork slow pace = MMRC2 = can't walk a nl pace on a flat grade s sob but does fine slow and flat  Cough: sometimes p supper min mucoid Sleep: ok   rec Ok to stop 02  Plan A = Automatic = change bevespi to symbicort 160  Take 2 puffs first thing in am and then another 2 puffs about 12 hours later.  Work on inhaler technique:  relax and gently Plan B = Backup Only use your albuterol as a rescue medication     Sinus CT 08/19/17  L max sinusitis > augmentin x 20 days > cleared 09/17/17     12/31/2017  Acute extended  ov/Derrick Moon re:  Aecopd/ RAF  Chief Complaint  Patient presents with  . Acute Visit    Increased SOB and cough over the past 3-4 days. He is coughing up some yellow sputum.   Dyspnea:  Worse x 3-4 days now across the room Cough: congested, rattling thick yellow, not using flutter  Sleeping: flat bed, one pillow used  SABA use: up to twice daily, not helping 02: no longer has it / says sats running in 80s for days rec   To ER   Date of admission: 12/31/2017             Date of discharge: 01/04/2018     Discharge Diagnoses:  Principal Problem:   COPD exacerbation ([place])   Essential hypertension   PAF (paroxysmal atrial fibrillation) ([facility])   SIRS (systemic inflammatory response syndrome) ([facility])   Otitis media   Tobacco use    1. Acute hypoxic respiratory failure. Parainfluenza bronchitis. COPD exacerbation. Continue steroids, completed antibiotics. No growth on cultures. Continue nebulizers.  2. SIRS. Acute otitis media. On admission patient was found to have acute otitis media with SIRS-like picture. Procalcitonin  is not significantly elevated. continue azithromycin for now.  3. Paroxysmal A. fib. On chronic anticoagulation. RVR overnight. IV Lopressor. Now better Continue Eliquis.  4. Mild elevation of AST ALT. Monitor outpatient.  5. Essential hypertension.  Blood pressure significantly elevated. New medications added. Monitor.     02/02/2018 extended f/u ov/Derrick Moon re:  Transition of care for GOLD II/III AB / 02 prn Chief Complaint  Patient presents with  . Follow-up    Sob is worse,coughing up yellow/green mucus.  Dyspnea: MMRC3 = can't walk 100 yards even at a slow pace at a flat grade s stopping due to sob  Doe fine leaning on buggy off 02 [employer]  Cough: turned color x last few days prior to OV  /variable, not necessarily worse in am  Sleeping: on back/ flat bed / one pillow - sleeps in recliner once  A week  SABA use: twice a week  02: not using at hs  And rarely prn  rec Augmentin 875 mg take one pill twice daily  X 10 days   depomedrol 120 mg IM today  Reduce lopressor  25mg  To 12.5mg   twice  daily (so break the 25 mg  in half)  For cough mucinex or mucinex dm up to 1200 mg every 12 hours as needed with flutter valve as much as possible  For sats below 90% with activity, use  02 2lpm with activity but otherwise no need    03/12/2018  f/u ov/Derrick Moon re: COPD II  Chief Complaint  Patient presents with  . Follow-up    PFT's done today. His breathing is improving. He is using his albuterol inhaler and neb both about 2 x per wk.   Dyspnea:  Improved = MMRC2 = can't walk a nl pace on a flat grade s sob but does fine slow and flat eg  / back and knees more likely to limit Cough: very little - first thing in am  Sleeping: flat bed  SABA use: 2 x weekly 02: just prn p ex   At hs feels throat feels "congested" / better p mucinex    No obvious day to day or daytime variability or assoc excess/ purulent sputum or mucus plugs or hemoptysis or cp or chest tightness,  subjective wheeze or overt sinus or hb symptoms.   Sleeping as above without nocturnal  or early am exacerbation  of respiratory  c/o's or need for noct saba. Also denies any obvious fluctuation of symptoms with weather or environmental changes or other aggravating or alleviating factors except as outlined above   No unusual exposure hx or h/o childhood pna/ asthma or knowledge of premature birth.  Current Allergies, Complete Past Medical History, Past Surgical History, Family History, and Social History were reviewed in [employer] [employer] record.  ROS  The following are not active complaints unless bolded Hoarseness, sore throat, dysphagia, dental problems, itching, sneezing,  nasal congestion or discharge of excess mucus or purulent secretions, ear ache,   fever, chills, sweats, unintended wt loss or wt gain, classically pleuritic or exertional cp,  orthopnea pnd or arm/hand swelling  or leg swelling, presyncope, palpitations, abdominal pain, anorexia, nausea, vomiting, diarrhea  or change in bowel habits or change in bladder habits, change in stools or change in urine, dysuria, hematuria,  rash, arthralgias, visual complaints, headache, numbness, weakness or ataxia or problems with walking or coordination,  change in mood or  memory.        Current Meds  Medication Sig  . acetaminophen (TYLENOL) 500 MG tablet Take 1,000 mg by mouth every 6 (six) hours as needed (for pain or headaches).   [name] albuterol (PROVENTIL HFA;VENTOLIN HFA) 108 (90 BASE) MCG/ACT inhaler Inhale 1-2 puffs into  the lungs every 6 (six) hours as needed for wheezing or shortness of breath. Reported on 10/03/2015  . amLODipine (NORVASC) 10 MG tablet Take 1 tablet (10 mg total) by mouth daily. (Patient taking differently: 1/2 every am and 1 at bedtime)  . apixaban (ELIQUIS) 5 MG TABS tablet Take 1 tablet (5 mg total) 2 (two) times daily by mouth.  . budesonide-formoterol (SYMBICORT) 160-4.5 MCG/ACT inhaler Inhale 2  puffs into the lungs 2 (two) times daily.  . diphenhydrAMINE (BENADRYL) 25 MG tablet Take 25 mg by mouth every 6 (six) hours as needed for itching or allergies.   [name] docusate sodium (COLACE) 100 MG capsule Take 1 capsule (100 mg total) by mouth 2 (two) times daily.  . famotidine (PEPCID) 20 MG tablet One at bedtime (Patient taking differently: Take 20 mg by mouth at bedtime. )  . ipratropium-albuterol (DUONEB) 0.5-2.5 (3) MG/3ML SOLN Take 3 mLs by nebulization every 4 (four) hours as needed.  . metoprolol tartrate (LOPRESSOR) 25 MG tablet One half twice daily  . montelukast (SINGULAIR) 10 MG tablet Take 1 tablet (10 mg total) by mouth at bedtime.  . pantoprazole (PROTONIX) 40 MG tablet Take 1 tablet (40 mg total) by mouth daily. (Patient taking differently: Take 40 mg by mouth daily before breakfast. )  . Tamsulosin HCl (FLOMAX) 0.4 MG CAPS Take 0.4 mg by mouth daily after supper.                      Objective:   Physical Exam   amb wm nad   03/12/2018      198  02/02/2018      200 12/31/2017      196  12/22/2017        200  09/21/2017          206  08/10/2017        202  07/17/2017       198  05/11/2017        198  03/30/2017          194  02/20/2017     189 02/02/2017      189  12/12/2016        201  01/09/2016      198   12/12/15 198 lb (89.8 kg)  11/09/15 201 lb 12.8 oz (91.5 kg)  10/03/15 199 lb (90.3 kg)       Vital signs reviewed - Note on arrival 02 sats  97% on    RA    HEENT: nl dentition / oropharynx. Nl external ear canals without cough reflex -  Mild bilateral non-specific turbinate edema     NECK :  without JVD/Nodes/TM/ nl carotid upstrokes bilaterally   LUNGS: no acc muscle use,  Mild/mod barrel  contour chest wall with bilateral slt diminished bs s audible wheeze and  without cough on insp or exp maneuver and mild/mod  Hyperresonant  to  percussion bilaterally     CV:  RRR  no s3 or murmur or increase in P2, and no edema   ABD:  soft and nontender  with pos mid  insp Hoover's  in the supine position. No bruits or organomegaly appreciated, bowel sounds nl  MS:   Nl gait/  ext warm without deformities, calf tenderness, cyanosis or clubbing No obvious joint restrictions   SKIN: warm and dry without lesions    NEURO:  alert, approp, nl sensorium with  no motor or cerebellar deficits apparent.  Assessment:

## 2018-03-12 NOTE — Patient Instructions (Signed)
No change in medications  Work on inhaler technique:  relax and gently blow all the way out then take a nice smooth deep breath back in, triggering the inhaler at same time you start breathing in.  Hold for up to 5 seconds if you can. Blow out thru nose. Rinse and gargle with water when done   Please schedule a follow up visit in 3 months but call sooner if needed

## 2018-03-12 NOTE — Progress Notes (Signed)
PFT done today. 

## 2018-03-14 ENCOUNTER — Encounter: Payer: Self-pay | Admitting: Internal Medicine

## 2018-03-14 NOTE — Assessment & Plan Note (Signed)
05/11/2017  Walked RA x 3 laps @ 185 ft each stopped due to  End of study, nl pace, no sob or desat    - d/c 02 05/11/2017  - discharged on 02 again 12/31/17  But not using  - 02/02/2018   Walked RA  2 laps @ 185 ft each stopped due to  Sob and legs tired but no desat semi-fast pace so rec 02 2lpm just with heavy exertion   Doing fine with just 02 prn > no change rx    I had an extended discussion with the patient reviewing all relevant studies completed to date and  lasting 15 to 20 minutes of a 25 minute visit    See device teaching which extended face to face time for this visit.  Each maintenance medication was reviewed in detail including emphasizing most importantly the difference between maintenance and prns and under what circumstances the prns are to be triggered using an action plan format that is not reflected in the computer generated alphabetically organized AVS which I have not found useful in most complex patients, especially with respiratory illnesses  Please see AVS for specific instructions unique to this visit that I personally wrote and verbalized to the the pt in detail and then reviewed with pt  by my nurse highlighting any  changes in therapy recommended at today's visit to their plan of care.

## 2018-04-06 DIAGNOSIS — J441 Chronic obstructive pulmonary disease with (acute) exacerbation: Secondary | ICD-10-CM | POA: Diagnosis not present

## 2018-04-06 DIAGNOSIS — J9620 Acute and chronic respiratory failure, unspecified whether with hypoxia or hypercapnia: Secondary | ICD-10-CM | POA: Diagnosis not present

## 2018-04-06 DIAGNOSIS — J479 Bronchiectasis, uncomplicated: Secondary | ICD-10-CM | POA: Diagnosis not present

## 2018-04-06 DIAGNOSIS — R0602 Shortness of breath: Secondary | ICD-10-CM | POA: Diagnosis not present

## 2018-04-07 DIAGNOSIS — J9620 Acute and chronic respiratory failure, unspecified whether with hypoxia or hypercapnia: Secondary | ICD-10-CM | POA: Diagnosis not present

## 2018-04-07 DIAGNOSIS — J441 Chronic obstructive pulmonary disease with (acute) exacerbation: Secondary | ICD-10-CM | POA: Diagnosis not present

## 2018-04-07 DIAGNOSIS — J479 Bronchiectasis, uncomplicated: Secondary | ICD-10-CM | POA: Diagnosis not present

## 2018-04-07 DIAGNOSIS — R0602 Shortness of breath: Secondary | ICD-10-CM | POA: Diagnosis not present

## 2018-04-20 ENCOUNTER — Other Ambulatory Visit: Payer: Self-pay | Admitting: Internal Medicine

## 2018-04-26 DIAGNOSIS — Z8551 Personal history of malignant neoplasm of bladder: Secondary | ICD-10-CM | POA: Diagnosis not present

## 2018-04-26 DIAGNOSIS — N400 Enlarged prostate without lower urinary tract symptoms: Secondary | ICD-10-CM | POA: Diagnosis not present

## 2018-05-04 DIAGNOSIS — L851 Acquired keratosis [keratoderma] palmaris et plantaris: Secondary | ICD-10-CM | POA: Diagnosis not present

## 2018-05-04 DIAGNOSIS — I739 Peripheral vascular disease, unspecified: Secondary | ICD-10-CM | POA: Diagnosis not present

## 2018-05-04 DIAGNOSIS — B351 Tinea unguium: Secondary | ICD-10-CM | POA: Diagnosis not present

## 2018-05-07 DIAGNOSIS — J9620 Acute and chronic respiratory failure, unspecified whether with hypoxia or hypercapnia: Secondary | ICD-10-CM | POA: Diagnosis not present

## 2018-05-07 DIAGNOSIS — R0602 Shortness of breath: Secondary | ICD-10-CM | POA: Diagnosis not present

## 2018-05-07 DIAGNOSIS — J441 Chronic obstructive pulmonary disease with (acute) exacerbation: Secondary | ICD-10-CM | POA: Diagnosis not present

## 2018-05-07 DIAGNOSIS — J479 Bronchiectasis, uncomplicated: Secondary | ICD-10-CM | POA: Diagnosis not present

## 2018-05-08 DIAGNOSIS — J441 Chronic obstructive pulmonary disease with (acute) exacerbation: Secondary | ICD-10-CM | POA: Diagnosis not present

## 2018-05-08 DIAGNOSIS — J479 Bronchiectasis, uncomplicated: Secondary | ICD-10-CM | POA: Diagnosis not present

## 2018-05-08 DIAGNOSIS — J9620 Acute and chronic respiratory failure, unspecified whether with hypoxia or hypercapnia: Secondary | ICD-10-CM | POA: Diagnosis not present

## 2018-05-08 DIAGNOSIS — R0602 Shortness of breath: Secondary | ICD-10-CM | POA: Diagnosis not present

## 2018-05-13 ENCOUNTER — Other Ambulatory Visit: Payer: Self-pay | Admitting: "Endocrinology

## 2018-05-13 ENCOUNTER — Ambulatory Visit: Payer: PPO | Admitting: "Endocrinology

## 2018-05-13 DIAGNOSIS — R7989 Other specified abnormal findings of blood chemistry: Secondary | ICD-10-CM

## 2018-05-13 DIAGNOSIS — E0590 Thyrotoxicosis, unspecified without thyrotoxic crisis or storm: Secondary | ICD-10-CM

## 2018-05-18 DIAGNOSIS — R7989 Other specified abnormal findings of blood chemistry: Secondary | ICD-10-CM | POA: Diagnosis not present

## 2018-05-18 DIAGNOSIS — E0590 Thyrotoxicosis, unspecified without thyrotoxic crisis or storm: Secondary | ICD-10-CM | POA: Diagnosis not present

## 2018-05-19 LAB — TSH: TSH: 0.6 m[IU]/L (ref 0.40–4.50)

## 2018-05-19 LAB — T4, FREE: FREE T4: 1 ng/dL (ref 0.8–1.8)

## 2018-05-28 ENCOUNTER — Encounter: Payer: Self-pay | Admitting: "Endocrinology

## 2018-05-28 ENCOUNTER — Ambulatory Visit (INDEPENDENT_AMBULATORY_CARE_PROVIDER_SITE_OTHER): Payer: PPO | Admitting: "Endocrinology

## 2018-05-28 VITALS — BP 119/63 | HR 57 | Ht 71.0 in | Wt 199.2 lb

## 2018-05-28 DIAGNOSIS — R7989 Other specified abnormal findings of blood chemistry: Secondary | ICD-10-CM

## 2018-05-28 NOTE — Progress Notes (Signed)
Endocrinology follow-up note                                             05/28/2018, 2:08 PM   Subjective:    Patient ID: Derrick Moon, male    DOB: 10-22-1940, PCP [name], MD   Past Medical History:  Diagnosis Date  . AAA (abdominal aortic aneurysm) ([place])   . Anxiety    due to pain in leg  . Arthritis    Osteo  . Bell's palsy   . Cancer [facility])    Bladder cancer  . Cataracts, bilateral   . COPD (chronic obstructive pulmonary disease) (HCC)    Albuterol inhaler as needed  . Diverticulitis   . Enlarged prostate    takes Flomax daily  . GERD (gastroesophageal reflux disease)    was on Prilosec but has been off for yrs-no issues now with reflux  . Headache    migraines years ago  . History of bronchitis    > 43yrs ago  . History of colon polyps    benign  . History of dizziness    several yrs ago-was on Meclizine but has been off for several yrs-no problems since  . History of hiatal hernia   . Hypertension    takes Amlodipine and Lisinpril daily  . Joint pain   . Joint swelling   . Nocturia   . PAD (peripheral artery disease) (HCC)     bilateral superficial femoral artery occlusions   . Paroxysmal atrial fibrillation (HCC)   . Pneumonia    several times with most recent within 5 yrs ago  . Restless leg syndrome   . Rupture of left quadriceps tendon   . Shortness of breath dyspnea    with exertion  . Sleep apnea    does not use cpap  . Urinary urgency    Past Surgical History:  Procedure Laterality Date  . BICEPS TENDON REPAIR     LEFT  . CARDIOVERSION N/A 02/06/2017   Procedure: CARDIOVERSION;  Surgeon: Jerline Pain, MD;  Location: [facility] ENDOSCOPY;  Service: Cardiovascular;  Laterality: N/A;  . CARPAL TUNNEL RELEASE Right 2014  . CHOLECYSTECTOMY  July 16,2010  . COLONOSCOPY    . CYSTOSCOPY    . ESOPHAGOGASTRODUODENOSCOPY    . FEMORAL-POPLITEAL BYPASS GRAFT Right 09/19/2015   Procedure: RIGHT COMMON FEMORAL-ABOVE KNEE POPLITEAL ARTERY     BYPASS GRAFT ;  Surgeon: Conrad [place], MD;  Location: [place];  Service: Vascular;  Laterality: Right;  . FOOT SURGERY Right    x 5t  . HAND SURGERY Right   . HERNIA REPAIR Bilateral    inguinal  . PERIPHERAL VASCULAR CATHETERIZATION N/A 08/02/2015   Procedure: Abdominal Aortogram w/Lower Extremity;  Surgeon: Conrad [place], MD;  Location: [place] CV LAB;  Service: Cardiovascular;  Laterality: N/A;  . QUADRICEPS TENDON REPAIR Left 08/22/2016   Procedure: LEFT OPEN QUADRICEP TENDON REPAIR;  Surgeon: Netta Cedars, MD;  Location: [place];  Service: Orthopedics;  Laterality: Left;  . REVERSE SHOULDER ARTHROPLASTY Right 07/07/2014   Procedure: RIGHT REVERSE TOTAL SHOULDER ;  Surgeon: Netta Cedars, MD;  Location: [place];  Service: Orthopedics;  Laterality: Right;  . REVERSE TOTAL SHOULDER ARTHROPLASTY Right 07/07/2014   Dr Veverly Fells  . ROTATOR CUFF REPAIR     Bilateral   . TEE WITHOUT CARDIOVERSION  N/A 02/06/2017   Procedure: TRANSESOPHAGEAL ECHOCARDIOGRAM (TEE);  Surgeon: Jerline Pain, MD;  Location: [facility] ENDOSCOPY;  Service: Cardiovascular;  Laterality: N/A;  . TONSILLECTOMY    . tumor removed from bladder    . VEIN HARVEST Right 09/19/2015   Procedure: WITH NON REVERSE RIGHT GREATER SAPHENOUS VEIN HARVEST;  Surgeon: Conrad [place], MD;  Location: [place];  Service: Vascular;  Laterality: Right;   Social History   Socioeconomic History  . Marital status: Married    Spouse name: Not on file  . Number of children: Not on file  . Years of education: Not on file  . Highest education level: Not on file  Occupational History  . Not on file  Social Needs  . Financial resource strain: Not on file  . Food insecurity:    Worry: Not on file    Inability: Not on file  . Transportation needs:    Medical: Not on file    Non-medical: Not on file  Tobacco Use  . Smoking status: Current Some Day Smoker    Packs/day: 1.00    Years: 45.00    Pack years: 45.00    Types: Cigars  . Smokeless tobacco: Never  Used  Substance and Sexual Activity  . Alcohol use: Yes    Alcohol/week: 0.0 standard drinks    Comment: wine occasionally  . Drug use: Yes    Types: Marijuana    Comment: rare; last use 2017  . Sexual activity: Yes  Lifestyle  . Physical activity:    Days per week: Not on file    Minutes per session: Not on file  . Stress: Not on file  Relationships  . Social connections:    Talks on phone: Not on file    Gets together: Not on file    Attends religious service: Not on file    Active member of club or organization: Not on file    Attends meetings of clubs or organizations: Not on file    Relationship status: Not on file  Other Topics Concern  . Not on file  Social History Narrative   Pt lives w/ wife in [place], [place]   Outpatient Encounter Medications as of 05/28/2018  Medication Sig  . acetaminophen (TYLENOL) 500 MG tablet Take 1,000 mg by mouth every 6 (six) hours as needed (for pain or headaches).   [name] albuterol (PROVENTIL HFA;VENTOLIN HFA) 108 (90 BASE) MCG/ACT inhaler Inhale 1-2 puffs into the lungs every 6 (six) hours as needed for wheezing or shortness of breath. Reported on 10/03/2015  . amLODipine (NORVASC) 10 MG tablet Take 1 tablet (10 mg total) by mouth daily. (Patient taking differently: 1/2 every am and 1 at bedtime)  . apixaban (ELIQUIS) 5 MG TABS tablet Take 1 tablet (5 mg total) 2 (two) times daily by mouth.  . budesonide-formoterol (SYMBICORT) 160-4.5 MCG/ACT inhaler Inhale 2 puffs into the lungs 2 (two) times daily.  . diphenhydrAMINE (BENADRYL) 25 MG tablet Take 25 mg by mouth every 6 (six) hours as needed for itching or allergies.   [name] docusate sodium (COLACE) 100 MG capsule Take 1 capsule (100 mg total) by mouth 2 (two) times daily.  . famotidine (PEPCID) 20 MG tablet One at bedtime (Patient taking differently: Take 20 mg by mouth at bedtime. )  . ipratropium-albuterol (DUONEB) 0.5-2.5 (3) MG/3ML SOLN Take 3 mLs by nebulization every 4 (four) hours as needed.   . metoprolol tartrate (LOPRESSOR) 25 MG tablet One half twice daily  . montelukast (SINGULAIR)  10 MG tablet Take 1 tablet (10 mg total) by mouth at bedtime.  . pantoprazole (PROTONIX) 40 MG tablet TAKE 1 TABLET BY MOUTH 30 TO 60 MINUTES PRIOR TO THE FIRST MEAL OF THE DAY.  . Tamsulosin HCl (FLOMAX) 0.4 MG CAPS Take 0.4 mg by mouth daily after supper.    No facility-administered encounter medications on file as of 05/28/2018.    Family History  Problem Relation Age of Onset  . Parkinson's disease Mother   . AAA (abdominal aortic aneurysm) Father   . Heart defect Sister        Congenital Heart Disease  . Heart disease Sister    ALLERGIES: Allergies  Allergen Reactions  . Adhesive [Tape] Other (See Comments)    SKIN IS VERY THIN AND IT TEARS VERY EASILY!!! Please use an alternative  . Aspartame And Phenylalanine Nausea Only  . Other Rash    Pt reports rash on back, buttocks, shoulders and stomach after arteriogram  . Sulfonamide Derivatives Nausea And Vomiting    VACCINATION STATUS: Immunization History  Administered Date(s) Administered  . Influenza, High Dose Seasonal PF 12/12/2015, 01/08/2017, 12/22/2017  . Tdap 08/15/2016    HPI Derrick Moon is 78 y.o. male who presents today with a medical history as above. he is returning with repeat thyroid function tests after he was seen in the past for abnormal thyroid function tests.  He is not on any thyroid hormone supplement nor antithyroid medications.   -He denies any prior history of thyroid dysfunction nor any family history of thyroid dysfunction or thyroid cancer. -He was hospitalized in November 2018 with community-acquired pneumonia, when he was found to have suppressed TSH on 2 occasions in the span of 3 days associated with a slightly elevated free T4. -His subsequent thyroid function tests are within normal limits on 2 separate occasions. -He continues to have dyspnea on exertion, most likely related to his COPD, coronary  artery disease.  He continues to smoke. -He denies palpitations-his current pulse rate is 57, no heat intolerance. He has long-standing history of tremors.   -He denies any exposure to medications such as amiodarone, lithium. -His repeat labs show normal thyroid function test.   Review of Systems  Constitutional: + Steady weight  , + fatigue, no subjective hyperthermia, no subjective hypothermia Eyes: no blurry vision, no xerophthalmia ENT: no sore throat, no nodules palpated in throat, no dysphagia/odynophagia, no hoarseness Cardiovascular: no Chest Pain, + Shortness of Breath, no palpitations, no leg swelling Respiratory: + Cough, + shortness of breath on exertion.   Gastrointestinal: no Nausea/Vomiting/Diarhhea Musculoskeletal: no muscle/joint aches Skin: no rashes Neurological: + tremors, no numbness, no tingling, no dizziness Psychiatric: no depression, no anxiety  Objective:    BP 119/63   Pulse (!) 57   Ht 5\' 11"  (1.803 m)   Wt 199 lb 3.2 oz (90.4 kg)   SpO2 97%   BMI 27.78 kg/m   Wt Readings from Last 3 Encounters:  05/28/18 199 lb 3.2 oz (90.4 kg)  03/12/18 198 lb (89.8 kg)  02/04/18 195 lb (88.5 kg)    Physical Exam  Constitutional: + Overweight for height, in acute distress, normal state of mind.   Eyes: PERRLA, EOMI, no exophthalmos ENT: moist mucous membranes, no thyromegaly, no cervical lymphadenopathy  Musculoskeletal: no gross deformities, strength intact in all four extremities Skin: moist, warm, no rashes Neurological:  + Gross tremors on outstretched hands, DTR reflexes are normal on bilateral lower extremities.    CMP ( most recent)  CMP     Component Value Date/Time   NA 142 01/04/2018 0348   K 3.9 01/04/2018 0348   CL 108 01/04/2018 0348   CO2 26 01/04/2018 0348   GLUCOSE 108 (H) 01/04/2018 0348   BUN 17 01/04/2018 0348   CREATININE 0.88 01/04/2018 0348   CALCIUM 9.2 01/04/2018 0348   PROT 6.4 (L) 12/31/2017 1700   ALBUMIN 3.1 (L)  12/31/2017 1700   AST 52 (H) 12/31/2017 1700   ALT 52 (H) 12/31/2017 1700   ALKPHOS 88 12/31/2017 1700   BILITOT 0.7 12/31/2017 1700   GFRNONAA >60 01/04/2018 0348   GFRAA >60 01/04/2018 0348     Lipid Panel ( most recent) Lipid Panel     Component Value Date/Time   CHOL  06/29/2008 0200    153        ATP III CLASSIFICATION:  <200     mg/dL   Desirable  200-239  mg/dL   Borderline High  >=240    mg/dL   High          TRIG 123 06/29/2008 0200   HDL 29 (L) 06/29/2008 0200   CHOLHDL 5.3 06/29/2008 0200   VLDL 25 06/29/2008 0200   LDLCALC  06/29/2008 0200    99        Total Cholesterol/HDL:CHD Risk Coronary Heart Disease Risk Table                     Men   Women  1/2 Average Risk   3.4   3.3  Average Risk       5.0   4.4  2 X Average Risk   9.6   7.1  3 X Average Risk  23.4   11.0        Use the calculated Patient Ratio above and the CHD Risk Table to determine the patient's CHD Risk.        ATP III CLASSIFICATION (LDL):  <100     mg/dL   Optimal  100-129  mg/dL   Near or Above                    Optimal  130-159  mg/dL   Borderline  160-189  mg/dL   High  >190     mg/dL   Very High     Results for Derrick Moon, Derrick Moon (MRN [id number]) as of 05/28/2018 14:12  Ref. Range 01/01/2018 06:22 05/18/2018 09:40  TSH Latest Ref Range: 0.40 - 4.50 mIU/L  0.60  T4,Free(Direct) Latest Ref Range: 0.8 - 1.8 ng/dL 1.30 1.0      Assessment & Plan:   1.  Abnormal thyroid function tests -resolved  -His repeat full profile thyroid function tests are completely normal, now for 2 separate occasions.  He will not require thyroid hormone supplement nor antithyroid treatment.   -He is clinically and biochemically euthyroid. -His symptoms including ongoing fatigue and dyspnea on exertion are likely due to his COPD.  -I have counseled him for smoking cessation given already established diagnosis of COPD .  He has a 75+-pack-year history of smoking. -I have advised him to continue follow-up  with his cardiologist, pulmonologist. -He does not have clinical goiter, hence no need for thyroid imaging at this time. - I advised patient to maintain close follow up with [name], MD for primary care needs.  Follow up plan: Return in about 1 year (around 05/28/2019) for Follow up with Pre-visit Labs.   Glade Lloyd, MD [facility] [address] [place], [place] [address] Phone: [phone]  Fax: [phone]     05/28/2018, 2:08 PM  This note was partially dictated with voice recognition software. Similar sounding words can be transcribed inadequately or may not  be corrected upon review.

## 2018-05-28 NOTE — Progress Notes (Signed)
LeighAnn Nysia Dell, CMA  

## 2018-06-05 DIAGNOSIS — J479 Bronchiectasis, uncomplicated: Secondary | ICD-10-CM | POA: Diagnosis not present

## 2018-06-05 DIAGNOSIS — J441 Chronic obstructive pulmonary disease with (acute) exacerbation: Secondary | ICD-10-CM | POA: Diagnosis not present

## 2018-06-05 DIAGNOSIS — J9620 Acute and chronic respiratory failure, unspecified whether with hypoxia or hypercapnia: Secondary | ICD-10-CM | POA: Diagnosis not present

## 2018-06-05 DIAGNOSIS — R062 Wheezing: Secondary | ICD-10-CM | POA: Diagnosis not present

## 2018-06-06 DIAGNOSIS — J441 Chronic obstructive pulmonary disease with (acute) exacerbation: Secondary | ICD-10-CM | POA: Diagnosis not present

## 2018-06-06 DIAGNOSIS — R062 Wheezing: Secondary | ICD-10-CM | POA: Diagnosis not present

## 2018-06-06 DIAGNOSIS — J9620 Acute and chronic respiratory failure, unspecified whether with hypoxia or hypercapnia: Secondary | ICD-10-CM | POA: Diagnosis not present

## 2018-06-06 DIAGNOSIS — J479 Bronchiectasis, uncomplicated: Secondary | ICD-10-CM | POA: Diagnosis not present

## 2018-06-14 ENCOUNTER — Ambulatory Visit: Payer: PPO | Admitting: Internal Medicine

## 2018-06-19 ENCOUNTER — Other Ambulatory Visit: Payer: Self-pay | Admitting: Internal Medicine

## 2018-06-22 DIAGNOSIS — J449 Chronic obstructive pulmonary disease, unspecified: Secondary | ICD-10-CM | POA: Diagnosis not present

## 2018-06-22 DIAGNOSIS — I10 Essential (primary) hypertension: Secondary | ICD-10-CM | POA: Diagnosis not present

## 2018-06-22 DIAGNOSIS — I480 Paroxysmal atrial fibrillation: Secondary | ICD-10-CM | POA: Diagnosis not present

## 2018-06-23 DIAGNOSIS — J302 Other seasonal allergic rhinitis: Secondary | ICD-10-CM | POA: Diagnosis not present

## 2018-06-23 DIAGNOSIS — Z7901 Long term (current) use of anticoagulants: Secondary | ICD-10-CM | POA: Diagnosis not present

## 2018-07-06 DIAGNOSIS — R062 Wheezing: Secondary | ICD-10-CM | POA: Diagnosis not present

## 2018-07-06 DIAGNOSIS — J441 Chronic obstructive pulmonary disease with (acute) exacerbation: Secondary | ICD-10-CM | POA: Diagnosis not present

## 2018-07-06 DIAGNOSIS — J9620 Acute and chronic respiratory failure, unspecified whether with hypoxia or hypercapnia: Secondary | ICD-10-CM | POA: Diagnosis not present

## 2018-07-06 DIAGNOSIS — J479 Bronchiectasis, uncomplicated: Secondary | ICD-10-CM | POA: Diagnosis not present

## 2018-07-07 DIAGNOSIS — R062 Wheezing: Secondary | ICD-10-CM | POA: Diagnosis not present

## 2018-07-07 DIAGNOSIS — J441 Chronic obstructive pulmonary disease with (acute) exacerbation: Secondary | ICD-10-CM | POA: Diagnosis not present

## 2018-07-07 DIAGNOSIS — J9620 Acute and chronic respiratory failure, unspecified whether with hypoxia or hypercapnia: Secondary | ICD-10-CM | POA: Diagnosis not present

## 2018-07-07 DIAGNOSIS — J479 Bronchiectasis, uncomplicated: Secondary | ICD-10-CM | POA: Diagnosis not present

## 2018-07-20 ENCOUNTER — Other Ambulatory Visit: Payer: Self-pay | Admitting: Internal Medicine

## 2018-08-05 DIAGNOSIS — J479 Bronchiectasis, uncomplicated: Secondary | ICD-10-CM | POA: Diagnosis not present

## 2018-08-05 DIAGNOSIS — J9620 Acute and chronic respiratory failure, unspecified whether with hypoxia or hypercapnia: Secondary | ICD-10-CM | POA: Diagnosis not present

## 2018-08-05 DIAGNOSIS — R062 Wheezing: Secondary | ICD-10-CM | POA: Diagnosis not present

## 2018-08-05 DIAGNOSIS — J441 Chronic obstructive pulmonary disease with (acute) exacerbation: Secondary | ICD-10-CM | POA: Diagnosis not present

## 2018-08-06 DIAGNOSIS — J479 Bronchiectasis, uncomplicated: Secondary | ICD-10-CM | POA: Diagnosis not present

## 2018-08-06 DIAGNOSIS — J9620 Acute and chronic respiratory failure, unspecified whether with hypoxia or hypercapnia: Secondary | ICD-10-CM | POA: Diagnosis not present

## 2018-08-06 DIAGNOSIS — R062 Wheezing: Secondary | ICD-10-CM | POA: Diagnosis not present

## 2018-08-06 DIAGNOSIS — J441 Chronic obstructive pulmonary disease with (acute) exacerbation: Secondary | ICD-10-CM | POA: Diagnosis not present

## 2018-08-12 ENCOUNTER — Other Ambulatory Visit: Payer: Self-pay | Admitting: Internal Medicine

## 2018-08-16 ENCOUNTER — Other Ambulatory Visit: Payer: Self-pay | Admitting: Internal Medicine

## 2018-08-18 ENCOUNTER — Telehealth: Payer: Self-pay | Admitting: *Deleted

## 2018-08-18 ENCOUNTER — Other Ambulatory Visit: Payer: PPO

## 2018-08-18 DIAGNOSIS — Z20822 Contact with and (suspected) exposure to covid-19: Secondary | ICD-10-CM

## 2018-08-18 DIAGNOSIS — R6889 Other general symptoms and signs: Secondary | ICD-10-CM | POA: Diagnosis not present

## 2018-08-18 DIAGNOSIS — J441 Chronic obstructive pulmonary disease with (acute) exacerbation: Secondary | ICD-10-CM | POA: Diagnosis not present

## 2018-08-18 NOTE — Telephone Encounter (Signed)
Referred by Dr. [name] for Covid 19 testing due to SOB and cough. Patient will call back for an appointment time later today.  Lab order placed, already. Office [phone]

## 2018-08-20 LAB — NOVEL CORONAVIRUS, NAA: SARS-CoV-2, NAA: NOT DETECTED

## 2018-09-05 DIAGNOSIS — J9620 Acute and chronic respiratory failure, unspecified whether with hypoxia or hypercapnia: Secondary | ICD-10-CM | POA: Diagnosis not present

## 2018-09-05 DIAGNOSIS — R062 Wheezing: Secondary | ICD-10-CM | POA: Diagnosis not present

## 2018-09-05 DIAGNOSIS — J479 Bronchiectasis, uncomplicated: Secondary | ICD-10-CM | POA: Diagnosis not present

## 2018-09-05 DIAGNOSIS — J441 Chronic obstructive pulmonary disease with (acute) exacerbation: Secondary | ICD-10-CM | POA: Diagnosis not present

## 2018-09-06 DIAGNOSIS — J441 Chronic obstructive pulmonary disease with (acute) exacerbation: Secondary | ICD-10-CM | POA: Diagnosis not present

## 2018-09-06 DIAGNOSIS — R062 Wheezing: Secondary | ICD-10-CM | POA: Diagnosis not present

## 2018-09-06 DIAGNOSIS — J9620 Acute and chronic respiratory failure, unspecified whether with hypoxia or hypercapnia: Secondary | ICD-10-CM | POA: Diagnosis not present

## 2018-09-06 DIAGNOSIS — J479 Bronchiectasis, uncomplicated: Secondary | ICD-10-CM | POA: Diagnosis not present

## 2018-09-15 ENCOUNTER — Other Ambulatory Visit: Payer: Self-pay | Admitting: Internal Medicine

## 2018-09-23 DIAGNOSIS — I480 Paroxysmal atrial fibrillation: Secondary | ICD-10-CM | POA: Diagnosis not present

## 2018-09-23 DIAGNOSIS — I10 Essential (primary) hypertension: Secondary | ICD-10-CM | POA: Diagnosis not present

## 2018-09-23 DIAGNOSIS — I713 Abdominal aortic aneurysm, ruptured: Secondary | ICD-10-CM | POA: Diagnosis not present

## 2018-10-05 DIAGNOSIS — J441 Chronic obstructive pulmonary disease with (acute) exacerbation: Secondary | ICD-10-CM | POA: Diagnosis not present

## 2018-10-05 DIAGNOSIS — R062 Wheezing: Secondary | ICD-10-CM | POA: Diagnosis not present

## 2018-10-05 DIAGNOSIS — J9620 Acute and chronic respiratory failure, unspecified whether with hypoxia or hypercapnia: Secondary | ICD-10-CM | POA: Diagnosis not present

## 2018-10-05 DIAGNOSIS — J479 Bronchiectasis, uncomplicated: Secondary | ICD-10-CM | POA: Diagnosis not present

## 2018-10-06 DIAGNOSIS — J479 Bronchiectasis, uncomplicated: Secondary | ICD-10-CM | POA: Diagnosis not present

## 2018-10-06 DIAGNOSIS — J441 Chronic obstructive pulmonary disease with (acute) exacerbation: Secondary | ICD-10-CM | POA: Diagnosis not present

## 2018-10-06 DIAGNOSIS — J9620 Acute and chronic respiratory failure, unspecified whether with hypoxia or hypercapnia: Secondary | ICD-10-CM | POA: Diagnosis not present

## 2018-10-06 DIAGNOSIS — R062 Wheezing: Secondary | ICD-10-CM | POA: Diagnosis not present

## 2018-11-04 DIAGNOSIS — R609 Edema, unspecified: Secondary | ICD-10-CM | POA: Diagnosis not present

## 2018-11-04 DIAGNOSIS — J441 Chronic obstructive pulmonary disease with (acute) exacerbation: Secondary | ICD-10-CM | POA: Diagnosis not present

## 2018-11-05 DIAGNOSIS — J479 Bronchiectasis, uncomplicated: Secondary | ICD-10-CM | POA: Diagnosis not present

## 2018-11-05 DIAGNOSIS — R062 Wheezing: Secondary | ICD-10-CM | POA: Diagnosis not present

## 2018-11-05 DIAGNOSIS — J441 Chronic obstructive pulmonary disease with (acute) exacerbation: Secondary | ICD-10-CM | POA: Diagnosis not present

## 2018-11-05 DIAGNOSIS — J9620 Acute and chronic respiratory failure, unspecified whether with hypoxia or hypercapnia: Secondary | ICD-10-CM | POA: Diagnosis not present

## 2018-11-06 DIAGNOSIS — J479 Bronchiectasis, uncomplicated: Secondary | ICD-10-CM | POA: Diagnosis not present

## 2018-11-06 DIAGNOSIS — J441 Chronic obstructive pulmonary disease with (acute) exacerbation: Secondary | ICD-10-CM | POA: Diagnosis not present

## 2018-11-06 DIAGNOSIS — R062 Wheezing: Secondary | ICD-10-CM | POA: Diagnosis not present

## 2018-11-06 DIAGNOSIS — J9620 Acute and chronic respiratory failure, unspecified whether with hypoxia or hypercapnia: Secondary | ICD-10-CM | POA: Diagnosis not present

## 2018-12-06 DIAGNOSIS — J479 Bronchiectasis, uncomplicated: Secondary | ICD-10-CM | POA: Diagnosis not present

## 2018-12-06 DIAGNOSIS — J9620 Acute and chronic respiratory failure, unspecified whether with hypoxia or hypercapnia: Secondary | ICD-10-CM | POA: Diagnosis not present

## 2018-12-06 DIAGNOSIS — J441 Chronic obstructive pulmonary disease with (acute) exacerbation: Secondary | ICD-10-CM | POA: Diagnosis not present

## 2018-12-06 DIAGNOSIS — R062 Wheezing: Secondary | ICD-10-CM | POA: Diagnosis not present

## 2018-12-07 DIAGNOSIS — J479 Bronchiectasis, uncomplicated: Secondary | ICD-10-CM | POA: Diagnosis not present

## 2018-12-07 DIAGNOSIS — J9620 Acute and chronic respiratory failure, unspecified whether with hypoxia or hypercapnia: Secondary | ICD-10-CM | POA: Diagnosis not present

## 2018-12-07 DIAGNOSIS — R062 Wheezing: Secondary | ICD-10-CM | POA: Diagnosis not present

## 2018-12-07 DIAGNOSIS — J441 Chronic obstructive pulmonary disease with (acute) exacerbation: Secondary | ICD-10-CM | POA: Diagnosis not present

## 2018-12-21 ENCOUNTER — Other Ambulatory Visit (HOSPITAL_COMMUNITY): Payer: Self-pay | Admitting: Internal Medicine

## 2018-12-21 ENCOUNTER — Ambulatory Visit (HOSPITAL_COMMUNITY)
Admission: RE | Admit: 2018-12-21 | Discharge: 2018-12-21 | Disposition: A | Discharge status: Home / Self Care | Payer: PPO | Source: Ambulatory Visit | Attending: Internal Medicine | Admitting: Internal Medicine

## 2018-12-21 ENCOUNTER — Other Ambulatory Visit: Payer: Self-pay

## 2018-12-21 DIAGNOSIS — R05 Cough: Secondary | ICD-10-CM

## 2018-12-21 DIAGNOSIS — R059 Cough, unspecified: Secondary | ICD-10-CM

## 2018-12-21 DIAGNOSIS — R0602 Shortness of breath: Secondary | ICD-10-CM | POA: Diagnosis not present

## 2018-12-21 DIAGNOSIS — J441 Chronic obstructive pulmonary disease with (acute) exacerbation: Secondary | ICD-10-CM | POA: Diagnosis not present

## 2018-12-21 DIAGNOSIS — Z23 Encounter for immunization: Secondary | ICD-10-CM | POA: Diagnosis not present

## 2019-01-05 DIAGNOSIS — J9620 Acute and chronic respiratory failure, unspecified whether with hypoxia or hypercapnia: Secondary | ICD-10-CM | POA: Diagnosis not present

## 2019-01-05 DIAGNOSIS — R062 Wheezing: Secondary | ICD-10-CM | POA: Diagnosis not present

## 2019-01-05 DIAGNOSIS — J479 Bronchiectasis, uncomplicated: Secondary | ICD-10-CM | POA: Diagnosis not present

## 2019-01-05 DIAGNOSIS — J441 Chronic obstructive pulmonary disease with (acute) exacerbation: Secondary | ICD-10-CM | POA: Diagnosis not present

## 2019-01-06 DIAGNOSIS — J479 Bronchiectasis, uncomplicated: Secondary | ICD-10-CM | POA: Diagnosis not present

## 2019-01-06 DIAGNOSIS — J9620 Acute and chronic respiratory failure, unspecified whether with hypoxia or hypercapnia: Secondary | ICD-10-CM | POA: Diagnosis not present

## 2019-01-06 DIAGNOSIS — J441 Chronic obstructive pulmonary disease with (acute) exacerbation: Secondary | ICD-10-CM | POA: Diagnosis not present

## 2019-01-06 DIAGNOSIS — R062 Wheezing: Secondary | ICD-10-CM | POA: Diagnosis not present

## 2019-01-24 DIAGNOSIS — J449 Chronic obstructive pulmonary disease, unspecified: Secondary | ICD-10-CM | POA: Diagnosis not present

## 2019-01-24 DIAGNOSIS — Z79899 Other long term (current) drug therapy: Secondary | ICD-10-CM | POA: Diagnosis not present

## 2019-01-24 DIAGNOSIS — I10 Essential (primary) hypertension: Secondary | ICD-10-CM | POA: Diagnosis not present

## 2019-01-24 DIAGNOSIS — I480 Paroxysmal atrial fibrillation: Secondary | ICD-10-CM | POA: Diagnosis not present

## 2019-01-24 DIAGNOSIS — I714 Abdominal aortic aneurysm, without rupture: Secondary | ICD-10-CM | POA: Diagnosis not present

## 2019-01-31 ENCOUNTER — Other Ambulatory Visit (HOSPITAL_COMMUNITY): Payer: Self-pay | Admitting: Internal Medicine

## 2019-01-31 DIAGNOSIS — J449 Chronic obstructive pulmonary disease, unspecified: Secondary | ICD-10-CM | POA: Diagnosis not present

## 2019-01-31 DIAGNOSIS — D509 Iron deficiency anemia, unspecified: Secondary | ICD-10-CM | POA: Diagnosis not present

## 2019-01-31 DIAGNOSIS — R0602 Shortness of breath: Secondary | ICD-10-CM

## 2019-02-01 ENCOUNTER — Other Ambulatory Visit: Payer: Self-pay

## 2019-02-01 ENCOUNTER — Ambulatory Visit (HOSPITAL_COMMUNITY)
Admission: RE | Admit: 2019-02-01 | Discharge: 2019-02-01 | Disposition: A | Discharge status: Home / Self Care | Payer: PPO | Source: Ambulatory Visit | Attending: Internal Medicine | Admitting: Internal Medicine

## 2019-02-01 DIAGNOSIS — R0602 Shortness of breath: Secondary | ICD-10-CM | POA: Diagnosis not present

## 2019-02-01 DIAGNOSIS — E538 Deficiency of other specified B group vitamins: Secondary | ICD-10-CM | POA: Diagnosis not present

## 2019-02-01 DIAGNOSIS — D649 Anemia, unspecified: Secondary | ICD-10-CM | POA: Diagnosis not present

## 2019-02-06 DIAGNOSIS — J9620 Acute and chronic respiratory failure, unspecified whether with hypoxia or hypercapnia: Secondary | ICD-10-CM | POA: Diagnosis not present

## 2019-02-06 DIAGNOSIS — R062 Wheezing: Secondary | ICD-10-CM | POA: Diagnosis not present

## 2019-02-06 DIAGNOSIS — J479 Bronchiectasis, uncomplicated: Secondary | ICD-10-CM | POA: Diagnosis not present

## 2019-02-06 DIAGNOSIS — J441 Chronic obstructive pulmonary disease with (acute) exacerbation: Secondary | ICD-10-CM | POA: Diagnosis not present

## 2019-02-08 ENCOUNTER — Ambulatory Visit (INDEPENDENT_AMBULATORY_CARE_PROVIDER_SITE_OTHER): Payer: PPO | Admitting: Gastroenterology

## 2019-02-08 ENCOUNTER — Other Ambulatory Visit: Payer: Self-pay

## 2019-02-08 ENCOUNTER — Encounter: Payer: Self-pay | Admitting: Gastroenterology

## 2019-02-08 VITALS — BP 146/77 | HR 81 | Temp 97.1°F | Ht 71.0 in | Wt 236.0 lb

## 2019-02-08 DIAGNOSIS — E538 Deficiency of other specified B group vitamins: Secondary | ICD-10-CM

## 2019-02-08 DIAGNOSIS — R140 Abdominal distension (gaseous): Secondary | ICD-10-CM | POA: Diagnosis not present

## 2019-02-08 DIAGNOSIS — D509 Iron deficiency anemia, unspecified: Secondary | ICD-10-CM | POA: Diagnosis not present

## 2019-02-08 NOTE — Patient Instructions (Signed)
1. Please go for ultrasound as scheduled.  2. We will call to schedule possible colonoscopy and upper endoscopy once I have spoken to Dr. Gala Romney.

## 2019-02-08 NOTE — Progress Notes (Addendum)
Primary Care Physician:  [name], MD  Primary Gastroenterologist:  Garfield Cornea, MD   Chief Complaint  Patient presents with  . low iron    HPI:  Derrick Moon is a 78 y.o. male here at the request of Dr. Willey Blade for further evaluation of low iron.   Patient had labs on 02/01/2019. Hemoglobin 10.1, hematocrit 36.1, MCV 71, iron 21 low, iron saturation 6% low, ferritin 16, TIBC 348, folate 7.9, B12 155 low, platelets 442,000.  Per Dr. Ria Comment note he had a hemoglobin of 15.8 last year and his MCV has dropped from 89 down to 70.    Patient states he dropped of hemoccult at PCP this AM and was told it was normal.   From GI standpoint he has had some recent abdominal pain and constipation. At baseline his entire life, he has had 4-5 soft BMs per day. Last week he got "plugged up" after starting prednisone. He add benefiber, stool softener, eventually ate a whole box of prunes. Finally got cleaned out and his stomach pain resolved. Generally does not typically have heartburn but has had some for a couple of days. No dysphagia.   Last tcs in 2011. Left sided diverticulosis.   For past 3 years he has had a lot of terrible breathing, DOE. H/o copd. usuing nebulizers, oxygen at home as needed. He has had prednisone four times in the past 3 months. Has been a couple of months since requiring antibiotics. He has gained 35 pounds in two months. Abdomen distended. In the mornings, he coughs up a little blood.   Denies ASA powders or NSAIDs.   Denies being started on B12 recently with low B12 level..   Chronically anticoagulated due to AFib.  Current Outpatient Medications  Medication Sig Dispense Refill  . acetaminophen (TYLENOL) 500 MG tablet Take 1,000 mg by mouth every 6 (six) hours as needed (for pain or headaches).     [name] albuterol (PROVENTIL HFA;VENTOLIN HFA) 108 (90 BASE) MCG/ACT inhaler Inhale 1-2 puffs into the lungs every 6 (six) hours as needed for wheezing or shortness of breath.  Reported on 10/03/2015    . amLODipine (NORVASC) 10 MG tablet Take 1 tablet (10 mg total) by mouth daily. (Patient taking differently: 1/2 every am and 1 at bedtime) 30 tablet 0  . apixaban (ELIQUIS) 5 MG TABS tablet Take 1 tablet (5 mg total) 2 (two) times daily by mouth. 60 tablet 0  . budesonide-formoterol (SYMBICORT) 160-4.5 MCG/ACT inhaler Inhale 2 puffs into the lungs 2 (two) times daily. 1 Inhaler 0  . diphenhydrAMINE (BENADRYL) 25 MG tablet Take 25 mg by mouth every 6 (six) hours as needed for itching or allergies.     [name] docusate sodium (COLACE) 100 MG capsule Take 1 capsule (100 mg total) by mouth 2 (two) times daily. (Patient taking differently: Take 100 mg by mouth as needed. ) 10 capsule 0  . famotidine (PEPCID) 20 MG tablet One at bedtime (Patient taking differently: Take 20 mg by mouth at bedtime. ) 30 tablet 11  . ipratropium-albuterol (DUONEB) 0.5-2.5 (3) MG/3ML SOLN Take 3 mLs by nebulization every 4 (four) hours as needed. 360 mL 0  . metoprolol tartrate (LOPRESSOR) 25 MG tablet One half twice daily    . montelukast (SINGULAIR) 10 MG tablet TAKE (1) TABLET BY MOUTH AT BEDTIME. 30 tablet 1  . pantoprazole (PROTONIX) 40 MG tablet TAKE 1 TABLET BY MOUTH 30 TO 60 MINUTES PRIOR TO THE FIRST MEAL OF THE DAY. 30 tablet  0  . predniSONE (DELTASONE) 10 MG tablet Take 10 mg by mouth as directed. Dose pack    . Tamsulosin HCl (FLOMAX) 0.4 MG CAPS Take 0.4 mg by mouth daily after supper.      No current facility-administered medications for this visit.     Allergies as of 02/08/2019 - Review Complete 02/08/2019  Allergen Reaction Noted  . Adhesive [tape] Other (See Comments) 12/31/2017  . Aspartame and phenylalanine Nausea Only 07/07/2014  . Other Rash 09/19/2015  . Sulfonamide derivatives Nausea And Vomiting     Past Medical History:  Diagnosis Date  . AAA (abdominal aortic aneurysm) ([place])   . Anxiety    due to pain in leg  . Arthritis    Osteo  . Bell's palsy   . Cancer [facility])     Bladder cancer  . Cataracts, bilateral   . COPD (chronic obstructive pulmonary disease) ([facility])    Albuterol inhaler as needed  . Diverticulitis   . Enlarged prostate    takes Flomax daily  . GERD (gastroesophageal reflux disease)    was on Prilosec but has been off for yrs-no issues now with reflux  . Headache    migraines years ago  . History of bronchitis    > 19yr ago  . History of colon polyps    benign  . History of dizziness    several yrs ago-was on Meclizine but has been off for several yrs-no problems since  . History of hiatal hernia   . Hypertension    takes Amlodipine and Lisinpril daily  . Joint pain   . Joint swelling   . Nocturia   . PAD (peripheral artery disease) ([facility])     bilateral superficial femoral artery occlusions   . Paroxysmal atrial fibrillation ([facility])   . Pneumonia    several times with most recent within 5 yrs ago  . Restless leg syndrome   . Rupture of left quadriceps tendon   . Shortness of breath dyspnea    with exertion  . Sleep apnea    does not use cpap  . Urinary urgency     Past Surgical History:  Procedure Laterality Date  . BICEPS TENDON REPAIR     LEFT  . CARDIOVERSION N/A 02/06/2017   Procedure: CARDIOVERSION;  Surgeon: SJerline Pain MD;  [facility];  Service: Cardiovascular;  Laterality: N/A;  . CARPAL TUNNEL RELEASE Right 2014  . CHOLECYSTECTOMY  July 16,2010  . COLONOSCOPY    . CYSTOSCOPY    . ESOPHAGOGASTRODUODENOSCOPY    . FEMORAL-POPLITEAL BYPASS GRAFT Right 09/19/2015   Procedure: RIGHT COMMON FEMORAL-ABOVE KNEE POPLITEAL ARTERY    BYPASS GRAFT ;  Surgeon: BConrad Marshallton MD;  [place] [facility]  Service: Vascular;  Laterality: Right;  . FOOT SURGERY Right    x 5t  . HAND SURGERY Right   . HERNIA REPAIR Bilateral    inguinal  . PERIPHERAL VASCULAR CATHETERIZATION N/A 08/02/2015   Procedure: Abdominal Aortogram w/Lower Extremity;  Surgeon: BConrad Hometown MD;  [facility];  Service:  Cardiovascular;  Laterality: N/A;  . QUADRICEPS TENDON REPAIR Left 08/22/2016   Procedure: LEFT OPEN QUADRICEP TENDON REPAIR;  Surgeon: NNetta Cedars MD;  [place] [facility]  Service: Orthopedics;  Laterality: Left;  . REVERSE SHOULDER ARTHROPLASTY Right 07/07/2014   Procedure: RIGHT REVERSE TOTAL SHOULDER ;  Surgeon: SNetta Cedars MD;  [place] [facility]  Service: Orthopedics;  Laterality: Right;  . REVERSE TOTAL SHOULDER ARTHROPLASTY Right 07/07/2014  Dr Veverly Fells  . ROTATOR CUFF REPAIR     Bilateral   . TEE WITHOUT CARDIOVERSION N/A 02/06/2017   Procedure: TRANSESOPHAGEAL ECHOCARDIOGRAM (TEE);  Surgeon: Jerline Pain, MD;  [facility];  Service: Cardiovascular;  Laterality: N/A;  . TONSILLECTOMY    . tumor removed from bladder    . VEIN HARVEST Right 09/19/2015   Procedure: WITH NON REVERSE RIGHT GREATER SAPHENOUS VEIN HARVEST;  Surgeon: Conrad Blackford, MD;  Location: [place];  Service: Vascular;  Laterality: Right;    Family History  Problem Relation Age of Onset  . Parkinson's disease Mother   . AAA (abdominal aortic aneurysm) Father   . Heart defect Sister        Congenital Heart Disease  . Heart disease Sister     Social History   Socioeconomic History  . Marital status: Married    Spouse name: Not on file  . Number of children: Not on file  . Years of education: Not on file  . Highest education level: Not on file  Occupational History  . Not on file  Social Needs  . Financial resource strain: Not on file  . Food insecurity    Worry: Not on file    Inability: Not on file  . Transportation needs    Medical: Not on file    Non-medical: Not on file  Tobacco Use  . Smoking status: Current Some Day Smoker    Packs/day: 1.00    Years: 45.00    Pack years: 45.00    Types: Cigars  . Smokeless tobacco: Never Used  Substance and Sexual Activity  . Alcohol use: Yes    Alcohol/week: 0.0 standard drinks    Comment: wine occasionally  . Drug use: Not Currently     Types: Marijuana  . Sexual activity: Yes  Lifestyle  . Physical activity    Days per week: Not on file    Minutes per session: Not on file  . Stress: Not on file  Relationships  . Social Herbalist on phone: Not on file    Gets together: Not on file    Attends religious service: Not on file    Active member of club or organization: Not on file    Attends meetings of clubs or organizations: Not on file    Relationship status: Not on file  . Intimate partner violence    Fear of current or ex partner: Not on file    Emotionally abused: Not on file    Physically abused: Not on file    Forced sexual activity: Not on file  Other Topics Concern  . Not on file  Social History Narrative   Pt lives w/ wife in [place], [place]      ROS:  General: Negative for anorexia, weight loss, fever, chills, fatigue, weakness. See hpi Eyes: Negative for vision changes.  ENT: Negative for hoarseness, difficulty swallowing , nasal congestion. CV: Negative for chest pain, angina, palpitations, +dyspnea on exertion, peripheral edema.  Respiratory: Negative for dyspnea at rest, +dyspnea on exertion, cough, sputum, +wheezing.  GI: See history of present illness. GU:  Negative for dysuria, hematuria, urinary incontinence, urinary frequency, nocturnal urination.  MS: Negative for joint pain, low back pain.  Derm: Negative for rash or itching.  Neuro: Negative for weakness, abnormal sensation, seizure, frequent headaches, memory loss, confusion.  Psych: Negative for anxiety, depression, suicidal ideation, hallucinations.  Endo: Negative for unusual weight change. See hpi Heme: Negative for bruising  or bleeding. Allergy: Negative for rash or hives.    Physical Examination:  BP (!) 146/77   Pulse 81   Temp (!) 97.1 F (36.2 C) (Temporal)   Ht 5' 11" (1.803 m)   Wt 236 lb (107 kg)   BMI 32.92 kg/m    General: Well-nourished, well-developed elderly male in no acute distress. Easily winded  with talking, getting on the exam table/lying reclined.  Head: Normocephalic, atraumatic.   Eyes: Conjunctiva pink, no icterus. Mouth: Oropharyngeal mucosa moist and pink , no lesions erythema or exudate. Neck: Supple without thyromegaly, masses, or lymphadenopathy.  Lungs: bilateral wheezing.  Heart: Regular rate and rhythm, no murmurs rubs or gallops.  Abdomen: Bowel sounds are normal, nontender, distended/hard, no hepatosplenomegaly or masses, no abdominal bruits or    hernia , no rebound or guarding.   Rectal: not performed Extremities: 1+ lower extremity edema. No clubbing or deformities.  Neuro: Alert and oriented x 4 , grossly normal neurologically.  Skin: Warm and dry, no rash or jaundice.   Psych: Alert and cooperative, normal mood and affect.  Labs: Labs from 01/24/2019: Glucose 93, creatinine 1.03, sodium 145, albumin 3.8, total bilirubin less than 0.2, alk phos 143, AST 24, ALT 29, white blood cell count 11,700, tsh 1.230  Imaging Studies: Dg Chest 2 View  Result Date: 02/01/2019 CLINICAL DATA:  78 year old male with shortness of breath. EXAM: CHEST - 2 VIEW COMPARISON:  Chest radiograph dated 12/12/2018 FINDINGS: There is diffuse chronic interstitial coarsening. No focal consolidation, pleural effusion, or pneumothorax. The cardiac silhouette is within normal limits. Atherosclerotic calcification of the aortic arch. Osteopenia with degenerative changes of the spine. Right shoulder arthroplasty. Left shoulder rotator cuff pin noted. No acute osseous pathology. IMPRESSION: No acute cardiopulmonary process. Electronically Signed   By: Anner Crete M.D.   On: 02/01/2019 13:07

## 2019-02-10 ENCOUNTER — Other Ambulatory Visit: Payer: Self-pay

## 2019-02-10 ENCOUNTER — Ambulatory Visit (HOSPITAL_COMMUNITY)
Admission: RE | Admit: 2019-02-10 | Discharge: 2019-02-10 | Disposition: A | Discharge status: Home / Self Care | Payer: PPO | Source: Ambulatory Visit | Attending: Gastroenterology | Admitting: Gastroenterology

## 2019-02-10 DIAGNOSIS — R140 Abdominal distension (gaseous): Secondary | ICD-10-CM | POA: Diagnosis not present

## 2019-02-10 DIAGNOSIS — E538 Deficiency of other specified B group vitamins: Secondary | ICD-10-CM | POA: Insufficient documentation

## 2019-02-10 DIAGNOSIS — K760 Fatty (change of) liver, not elsewhere classified: Secondary | ICD-10-CM | POA: Diagnosis not present

## 2019-02-10 NOTE — Assessment & Plan Note (Signed)
New onset IDA. Hgb last year over 15 per PCP and now 10.1. Anemia profile c/w IDA. Also with B12 is low. No overt GI bleeding and reportedly heme negative. Recent constipation, resolved. Noted 35 pound weight gain, abdominal distention. U/S planned to rule out ascites. Would consider colonoscopy +/- EGD to evaluate IDA. His respiratory status is worrisome with regards to endoscopic evaluation/sedation. To discuss further with Dr. Gala Romney.

## 2019-02-10 NOTE — Assessment & Plan Note (Signed)
Abdominal distention somewhat tense. Has gained 35 pounds in the past several months. Abdominal u/s to rule out ascites.

## 2019-02-14 NOTE — Progress Notes (Signed)
cc'ed to pcp °

## 2019-02-15 ENCOUNTER — Other Ambulatory Visit: Payer: Self-pay

## 2019-02-15 DIAGNOSIS — D509 Iron deficiency anemia, unspecified: Secondary | ICD-10-CM

## 2019-02-16 ENCOUNTER — Telehealth: Payer: Self-pay

## 2019-02-16 ENCOUNTER — Other Ambulatory Visit: Payer: Self-pay

## 2019-02-16 DIAGNOSIS — D509 Iron deficiency anemia, unspecified: Secondary | ICD-10-CM

## 2019-02-16 MED ORDER — PEG 3350-KCL-NA BICARB-NACL 420 G PO SOLR: 4000.0000 mL | ORAL | 0 refills | Status: DC

## 2019-02-16 NOTE — Telephone Encounter (Signed)
Pt is returning a call to schedule his procedure.

## 2019-02-16 NOTE — Telephone Encounter (Signed)
See result note, procedure scheduled.

## 2019-03-03 DIAGNOSIS — D509 Iron deficiency anemia, unspecified: Secondary | ICD-10-CM | POA: Diagnosis not present

## 2019-03-04 LAB — CBC WITH DIFFERENTIAL/PLATELET
Basophils Absolute: 0.1 10*3/uL (ref 0.0–0.2)
Basos: 1 %
EOS (ABSOLUTE): 0.4 10*3/uL (ref 0.0–0.4)
Eos: 4 %
Hematocrit: 35.9 % — ABNORMAL LOW (ref 37.5–51.0)
Hemoglobin: 9.8 g/dL — ABNORMAL LOW (ref 13.0–17.7)
Immature Grans (Abs): 0.1 10*3/uL (ref 0.0–0.1)
Immature Granulocytes: 1 %
Lymphocytes Absolute: 2.6 10*3/uL (ref 0.7–3.1)
Lymphs: 28 %
MCH: 19.4 pg — ABNORMAL LOW (ref 26.6–33.0)
MCHC: 27.3 g/dL — ABNORMAL LOW (ref 31.5–35.7)
MCV: 71 fL — ABNORMAL LOW (ref 79–97)
Monocytes Absolute: 0.9 10*3/uL (ref 0.1–0.9)
Monocytes: 10 %
Neutrophils Absolute: 5.4 10*3/uL (ref 1.4–7.0)
Neutrophils: 56 %
Platelets: 493 10*3/uL — ABNORMAL HIGH (ref 150–450)
RBC: 5.05 x10E6/uL (ref 4.14–5.80)
RDW: 20.3 % — ABNORMAL HIGH (ref 11.6–15.4)
WBC: 9.4 10*3/uL (ref 3.4–10.8)

## 2019-03-08 ENCOUNTER — Encounter: Payer: Self-pay | Admitting: Internal Medicine

## 2019-03-08 DIAGNOSIS — J441 Chronic obstructive pulmonary disease with (acute) exacerbation: Secondary | ICD-10-CM | POA: Diagnosis not present

## 2019-03-08 DIAGNOSIS — J9620 Acute and chronic respiratory failure, unspecified whether with hypoxia or hypercapnia: Secondary | ICD-10-CM | POA: Diagnosis not present

## 2019-03-08 DIAGNOSIS — R062 Wheezing: Secondary | ICD-10-CM | POA: Diagnosis not present

## 2019-03-08 DIAGNOSIS — J479 Bronchiectasis, uncomplicated: Secondary | ICD-10-CM | POA: Diagnosis not present

## 2019-03-11 ENCOUNTER — Emergency Department (HOSPITAL_COMMUNITY): Payer: PPO

## 2019-03-11 ENCOUNTER — Inpatient Hospital Stay (HOSPITAL_COMMUNITY)
Admission: EM | Admit: 2019-03-11 | Discharge: 2019-03-14 | DRG: 190 | Disposition: A | Discharge status: Home / Self Care | Payer: PPO | Source: Ambulatory Visit | Attending: Family Medicine | Admitting: Family Medicine

## 2019-03-11 ENCOUNTER — Other Ambulatory Visit: Payer: Self-pay

## 2019-03-11 ENCOUNTER — Encounter (HOSPITAL_COMMUNITY): Payer: Self-pay | Admitting: Emergency Medicine

## 2019-03-11 DIAGNOSIS — I70201 Unspecified atherosclerosis of native arteries of extremities, right leg: Secondary | ICD-10-CM | POA: Diagnosis present

## 2019-03-11 DIAGNOSIS — J9620 Acute and chronic respiratory failure, unspecified whether with hypoxia or hypercapnia: Secondary | ICD-10-CM | POA: Diagnosis present

## 2019-03-11 DIAGNOSIS — R635 Abnormal weight gain: Secondary | ICD-10-CM | POA: Diagnosis present

## 2019-03-11 DIAGNOSIS — Z882 Allergy status to sulfonamides status: Secondary | ICD-10-CM | POA: Diagnosis not present

## 2019-03-11 DIAGNOSIS — Z8249 Family history of ischemic heart disease and other diseases of the circulatory system: Secondary | ICD-10-CM

## 2019-03-11 DIAGNOSIS — F17290 Nicotine dependence, other tobacco product, uncomplicated: Secondary | ICD-10-CM | POA: Diagnosis not present

## 2019-03-11 DIAGNOSIS — N400 Enlarged prostate without lower urinary tract symptoms: Secondary | ICD-10-CM

## 2019-03-11 DIAGNOSIS — Z96611 Presence of right artificial shoulder joint: Secondary | ICD-10-CM | POA: Diagnosis present

## 2019-03-11 DIAGNOSIS — J441 Chronic obstructive pulmonary disease with (acute) exacerbation: Principal | ICD-10-CM | POA: Diagnosis present

## 2019-03-11 DIAGNOSIS — Z95820 Peripheral vascular angioplasty status with implants and grafts: Secondary | ICD-10-CM | POA: Diagnosis not present

## 2019-03-11 DIAGNOSIS — J449 Chronic obstructive pulmonary disease, unspecified: Secondary | ICD-10-CM

## 2019-03-11 DIAGNOSIS — Z8551 Personal history of malignant neoplasm of bladder: Secondary | ICD-10-CM

## 2019-03-11 DIAGNOSIS — I480 Paroxysmal atrial fibrillation: Secondary | ICD-10-CM | POA: Diagnosis present

## 2019-03-11 DIAGNOSIS — Z7901 Long term (current) use of anticoagulants: Secondary | ICD-10-CM

## 2019-03-11 DIAGNOSIS — R0602 Shortness of breath: Secondary | ICD-10-CM

## 2019-03-11 DIAGNOSIS — Z6834 Body mass index (BMI) 34.0-34.9, adult: Secondary | ICD-10-CM | POA: Diagnosis not present

## 2019-03-11 DIAGNOSIS — Z820 Family history of epilepsy and other diseases of the nervous system: Secondary | ICD-10-CM

## 2019-03-11 DIAGNOSIS — I10 Essential (primary) hypertension: Secondary | ICD-10-CM | POA: Diagnosis present

## 2019-03-11 DIAGNOSIS — I2781 Cor pulmonale (chronic): Secondary | ICD-10-CM | POA: Diagnosis not present

## 2019-03-11 DIAGNOSIS — Z7951 Long term (current) use of inhaled steroids: Secondary | ICD-10-CM

## 2019-03-11 DIAGNOSIS — D500 Iron deficiency anemia secondary to blood loss (chronic): Secondary | ICD-10-CM | POA: Diagnosis not present

## 2019-03-11 DIAGNOSIS — K219 Gastro-esophageal reflux disease without esophagitis: Secondary | ICD-10-CM | POA: Diagnosis not present

## 2019-03-11 DIAGNOSIS — Z79899 Other long term (current) drug therapy: Secondary | ICD-10-CM

## 2019-03-11 DIAGNOSIS — J9621 Acute and chronic respiratory failure with hypoxia: Secondary | ICD-10-CM | POA: Diagnosis not present

## 2019-03-11 DIAGNOSIS — Z20828 Contact with and (suspected) exposure to other viral communicable diseases: Secondary | ICD-10-CM | POA: Diagnosis not present

## 2019-03-11 DIAGNOSIS — D509 Iron deficiency anemia, unspecified: Secondary | ICD-10-CM | POA: Diagnosis not present

## 2019-03-11 DIAGNOSIS — J479 Bronchiectasis, uncomplicated: Secondary | ICD-10-CM

## 2019-03-11 DIAGNOSIS — I700 Atherosclerosis of aorta: Secondary | ICD-10-CM | POA: Diagnosis not present

## 2019-03-11 LAB — RESPIRATORY PANEL BY RT PCR (FLU A&B, COVID)
Influenza A by PCR: NEGATIVE
Influenza B by PCR: NEGATIVE
SARS Coronavirus 2 by RT PCR: NEGATIVE

## 2019-03-11 LAB — POC SARS CORONAVIRUS 2 AG -  ED: SARS Coronavirus 2 Ag: NEGATIVE

## 2019-03-11 LAB — CBC WITH DIFFERENTIAL/PLATELET
Abs Immature Granulocytes: 0.14 10*3/uL — ABNORMAL HIGH (ref 0.00–0.07)
Basophils Absolute: 0.1 10*3/uL (ref 0.0–0.1)
Basophils Relative: 1 %
Eosinophils Absolute: 0.3 10*3/uL (ref 0.0–0.5)
Eosinophils Relative: 3 %
HCT: 38 % — ABNORMAL LOW (ref 39.0–52.0)
Hemoglobin: 10.3 g/dL — ABNORMAL LOW (ref 13.0–17.0)
Immature Granulocytes: 1 %
Lymphocytes Relative: 20 %
Lymphs Abs: 2 10*3/uL (ref 0.7–4.0)
MCH: 20 pg — ABNORMAL LOW (ref 26.0–34.0)
MCHC: 27.1 g/dL — ABNORMAL LOW (ref 30.0–36.0)
MCV: 73.9 fL — ABNORMAL LOW (ref 80.0–100.0)
Monocytes Absolute: 0.8 10*3/uL (ref 0.1–1.0)
Monocytes Relative: 8 %
Neutro Abs: 6.6 10*3/uL (ref 1.7–7.7)
Neutrophils Relative %: 67 %
Platelets: 454 10*3/uL — ABNORMAL HIGH (ref 150–400)
RBC: 5.14 MIL/uL (ref 4.22–5.81)
RDW: 21.7 % — ABNORMAL HIGH (ref 11.5–15.5)
WBC: 9.9 10*3/uL (ref 4.0–10.5)
nRBC: 0 % (ref 0.0–0.2)

## 2019-03-11 LAB — COMPREHENSIVE METABOLIC PANEL
ALT: 29 U/L (ref 0–44)
AST: 28 U/L (ref 15–41)
Albumin: 3.6 g/dL (ref 3.5–5.0)
Alkaline Phosphatase: 112 U/L (ref 38–126)
Anion gap: 9 (ref 5–15)
BUN: 11 mg/dL (ref 8–23)
CO2: 26 mmol/L (ref 22–32)
Calcium: 9.5 mg/dL (ref 8.9–10.3)
Chloride: 106 mmol/L (ref 98–111)
Creatinine, Ser: 1 mg/dL (ref 0.61–1.24)
GFR calc Af Amer: >60 mL/min (ref >60)
GFR calc non Af Amer: >60 mL/min (ref >60)
Glucose, Bld: 147 mg/dL — ABNORMAL HIGH (ref 70–99)
Potassium: 3.8 mmol/L (ref 3.5–5.1)
Sodium: 141 mmol/L (ref 135–145)
Total Bilirubin: 0.5 mg/dL (ref 0.3–1.2)
Total Protein: 7.6 g/dL (ref 6.5–8.1)

## 2019-03-11 LAB — LACTIC ACID, PLASMA
Lactic Acid, Venous: 1.9 mmol/L (ref 0.5–1.9)
Lactic Acid, Venous: 2 mmol/L (ref 0.5–1.9)

## 2019-03-11 LAB — TRIGLYCERIDES: Triglycerides: 163 mg/dL — ABNORMAL HIGH (ref <150)

## 2019-03-11 LAB — LACTATE DEHYDROGENASE: LDH: 170 U/L (ref 98–192)

## 2019-03-11 LAB — D-DIMER, QUANTITATIVE: D-Dimer, Quant: 0.93 ug/mL-FEU — ABNORMAL HIGH (ref 0.00–0.50)

## 2019-03-11 LAB — TROPONIN I (HIGH SENSITIVITY)
Troponin I (High Sensitivity): 8 ng/L (ref <18)
Troponin I (High Sensitivity): 7 ng/L (ref <18)

## 2019-03-11 LAB — BRAIN NATRIURETIC PEPTIDE: B Natriuretic Peptide: 52 pg/mL (ref 0.0–100.0)

## 2019-03-11 LAB — FERRITIN: Ferritin: 9 ng/mL — ABNORMAL LOW (ref 24–336)

## 2019-03-11 LAB — FIBRINOGEN: Fibrinogen: 574 mg/dL — ABNORMAL HIGH (ref 210–475)

## 2019-03-11 LAB — C-REACTIVE PROTEIN: CRP: 3.2 mg/dL — ABNORMAL HIGH (ref <1.0)

## 2019-03-11 LAB — PROCALCITONIN: Procalcitonin: <0.1 ng/mL

## 2019-03-11 MED ORDER — DOCUSATE SODIUM 100 MG PO CAPS: 100.0000 mg | ORAL_CAPSULE | Freq: Two times a day (BID) | ORAL | Status: DC | PRN

## 2019-03-11 MED ORDER — FAMOTIDINE 20 MG PO TABS
20.0000 mg | ORAL_TABLET | Freq: Every day | ORAL | Status: DC
  Administered 2019-03-11 – 2019-03-13 (×3): 20 mg via ORAL
  Filled 2019-03-11 (×3): qty 1

## 2019-03-11 MED ORDER — METOPROLOL TARTRATE 25 MG PO TABS
12.5000 mg | ORAL_TABLET | Freq: Two times a day (BID) | ORAL | Status: DC
  Administered 2019-03-11 – 2019-03-14 (×6): 12.5 mg via ORAL
  Filled 2019-03-11 (×6): qty 1

## 2019-03-11 MED ORDER — FUROSEMIDE 10 MG/ML IJ SOLN
40.0000 mg | Freq: Once | INTRAMUSCULAR | Status: AC
  Administered 2019-03-11: 40 mg via INTRAVENOUS
  Filled 2019-03-11: qty 4

## 2019-03-11 MED ORDER — MAGNESIUM SULFATE 2 GM/50ML IV SOLN
2.0000 g | Freq: Once | INTRAVENOUS | Status: AC
  Administered 2019-03-11: 2 g via INTRAVENOUS
  Filled 2019-03-11: qty 50

## 2019-03-11 MED ORDER — MONTELUKAST SODIUM 10 MG PO TABS
10.0000 mg | ORAL_TABLET | Freq: Every day | ORAL | Status: DC
  Administered 2019-03-11 – 2019-03-13 (×3): 10 mg via ORAL
  Filled 2019-03-11 (×3): qty 1

## 2019-03-11 MED ORDER — FUROSEMIDE 10 MG/ML IJ SOLN
40.0000 mg | Freq: Every day | INTRAMUSCULAR | Status: DC
  Administered 2019-03-11 – 2019-03-14 (×4): 40 mg via INTRAVENOUS
  Filled 2019-03-11 (×4): qty 4

## 2019-03-11 MED ORDER — ACETAMINOPHEN 500 MG PO TABS
1000.0000 mg | ORAL_TABLET | Freq: Four times a day (QID) | ORAL | Status: DC | PRN
  Administered 2019-03-12: 1000 mg via ORAL
  Filled 2019-03-11: qty 2

## 2019-03-11 MED ORDER — METHYLPREDNISOLONE SODIUM SUCC 125 MG IJ SOLR
125.0000 mg | Freq: Once | INTRAMUSCULAR | Status: AC
  Administered 2019-03-11: 125 mg via INTRAVENOUS
  Filled 2019-03-11: qty 2

## 2019-03-11 MED ORDER — PANTOPRAZOLE SODIUM 40 MG PO TBEC
40.0000 mg | DELAYED_RELEASE_TABLET | Freq: Every day | ORAL | Status: DC
  Administered 2019-03-11 – 2019-03-14 (×4): 40 mg via ORAL
  Filled 2019-03-11 (×4): qty 1

## 2019-03-11 MED ORDER — AMLODIPINE BESYLATE 5 MG PO TABS
10.0000 mg | ORAL_TABLET | Freq: Every day | ORAL | Status: DC
  Administered 2019-03-11 – 2019-03-14 (×4): 10 mg via ORAL
  Filled 2019-03-11 (×4): qty 2

## 2019-03-11 MED ORDER — TAMSULOSIN HCL 0.4 MG PO CAPS
0.4000 mg | ORAL_CAPSULE | Freq: Every day | ORAL | Status: DC
  Administered 2019-03-12 – 2019-03-13 (×2): 0.4 mg via ORAL
  Filled 2019-03-11 (×2): qty 1

## 2019-03-11 MED ORDER — APIXABAN 5 MG PO TABS
5.0000 mg | ORAL_TABLET | Freq: Two times a day (BID) | ORAL | Status: DC
  Administered 2019-03-11 – 2019-03-14 (×6): 5 mg via ORAL
  Filled 2019-03-11 (×6): qty 1

## 2019-03-11 MED ORDER — HYDROMORPHONE HCL 1 MG/ML IJ SOLN
0.5000 mg | Freq: Once | INTRAMUSCULAR | Status: AC
  Administered 2019-03-11: 0.5 mg via INTRAVENOUS
  Filled 2019-03-11: qty 1

## 2019-03-11 MED ORDER — ALBUTEROL SULFATE HFA 108 (90 BASE) MCG/ACT IN AERS
2.0000 | INHALATION_SPRAY | Freq: Once | RESPIRATORY_TRACT | Status: AC
  Administered 2019-03-11: 2 via RESPIRATORY_TRACT
  Filled 2019-03-11: qty 6.7

## 2019-03-11 MED ORDER — IPRATROPIUM-ALBUTEROL 0.5-2.5 (3) MG/3ML IN SOLN
3.0000 mL | Freq: Four times a day (QID) | RESPIRATORY_TRACT | Status: DC
  Administered 2019-03-11 – 2019-03-14 (×10): 3 mL via RESPIRATORY_TRACT
  Filled 2019-03-11 (×10): qty 3

## 2019-03-11 MED ORDER — ALBUTEROL SULFATE (2.5 MG/3ML) 0.083% IN NEBU: 2.5000 mg | INHALATION_SOLUTION | RESPIRATORY_TRACT | Status: DC | PRN

## 2019-03-11 MED ORDER — METHYLPREDNISOLONE SODIUM SUCC 125 MG IJ SOLR
60.0000 mg | Freq: Four times a day (QID) | INTRAMUSCULAR | Status: DC
  Administered 2019-03-11 – 2019-03-12 (×4): 60 mg via INTRAVENOUS
  Filled 2019-03-11 (×4): qty 2

## 2019-03-11 NOTE — H&P (Signed)
History and Physical:    Derrick Moon   [id number] DOB: [birth year] DOA: 03/11/2019  Referring MD/provider: PA Nils Flack PCP: [name], MD   Patient coming from: Home  Chief Complaint: Shortness of breath  History of Present Illness:   Derrick Moon is an 78 y.o. male with Gold III COPD presents with shortness of breath.  Patient states that he has had difficulty with shortness of breath and DOE over the past 3 to 4 months and has required least 3 courses of steroids since then.  Patient notes that he does well as long as he is on the steroids but as soon as he is tapered off he feels short of breath again.  Patient states that he had difficulty even walking across the room and was in fact short of breath at rest which is why he decided to come in today.  No fevers, no chills, no malaise, no change in appetite, no nausea, vomiting or diarrhea.  Patient notes this is how he feels when he comes off of his steroids.  Patient believes that he finished his last steroid course 2 weeks ago.  Of note patient is concerned that he has gained 44 pounds over the past 2 to 3 weeks.  He does admit to increased edema and increased abdominal girth.  Patient denies any chest pain.  He does have orthopnea but no PND but this is not new.  ED Course:  The patient was noted to be tachypneic with increased work of breathing.  He was treated with treated with 4 puffs albuterol inhaler, Lasix 40 mg IV, mag sulfate and Solu-Medrol 125 milligrams.  Patient states he feels somewhat improved.  ROS:   ROS   Review of Systems: General: No fever, chills, weight changes Skin: No rashes, lesions, wounds Endocrine: no heat/cold intolerance, no polyuria Cardiovascular: No palpitations, chest pain GI: No nausea, vomiting, diarrhea, constipation GU: No dysuria, increased frequency CNS: No numbness, dizziness, headache Blood/lymphatics: No easy bruising, bleeding Mood/affect: No anxiety/depression    Past  Medical History:   Past Medical History:  Diagnosis Date  . AAA (abdominal aortic aneurysm) ([place])   . Anxiety    due to pain in leg  . Arthritis    Osteo  . Bell's palsy   . Cancer [facility])    Bladder cancer  . Cataracts, bilateral   . COPD (chronic obstructive pulmonary disease) ([facility])    Albuterol inhaler as needed  . Diverticulitis   . Enlarged prostate    takes Flomax daily  . GERD (gastroesophageal reflux disease)    was on Prilosec but has been off for yrs-no issues now with reflux  . Headache    migraines years ago  . History of bronchitis    > 2yrs ago  . History of colon polyps    benign  . History of dizziness    several yrs ago-was on Meclizine but has been off for several yrs-no problems since  . History of hiatal hernia   . Hypertension    takes Amlodipine and Lisinpril daily  . Joint pain   . Joint swelling   . Nocturia   . PAD (peripheral artery disease) ([facility])     bilateral superficial femoral artery occlusions   . Paroxysmal atrial fibrillation ([facility])   . Pneumonia    several times with most recent within 5 yrs ago  . Restless leg syndrome   . Rupture of left quadriceps tendon   . Shortness of breath dyspnea  with exertion  . Sleep apnea    does not use cpap  . Urinary urgency     Past Surgical History:   Past Surgical History:  Procedure Laterality Date  . BICEPS TENDON REPAIR     LEFT  . CARDIOVERSION N/A 02/06/2017   Procedure: CARDIOVERSION;  Surgeon: Jerline Pain, MD;  Location: [facility] ENDOSCOPY;  Service: Cardiovascular;  Laterality: N/A;  . CARPAL TUNNEL RELEASE Right 2014  . CHOLECYSTECTOMY  July 16,2010  . COLONOSCOPY    . CYSTOSCOPY    . ESOPHAGOGASTRODUODENOSCOPY    . FEMORAL-POPLITEAL BYPASS GRAFT Right 09/19/2015   Procedure: RIGHT COMMON FEMORAL-ABOVE KNEE POPLITEAL ARTERY    BYPASS GRAFT ;  Surgeon: Conrad [place], MD;  Location: [place];  Service: Vascular;  Laterality: Right;  . FOOT SURGERY Right    x 5t  . HAND SURGERY Right     . HERNIA REPAIR Bilateral    inguinal  . PERIPHERAL VASCULAR CATHETERIZATION N/A 08/02/2015   Procedure: Abdominal Aortogram w/Lower Extremity;  Surgeon: Conrad [place], MD;  Location: [place] CV LAB;  Service: Cardiovascular;  Laterality: N/A;  . QUADRICEPS TENDON REPAIR Left 08/22/2016   Procedure: LEFT OPEN QUADRICEP TENDON REPAIR;  Surgeon: Netta Cedars, MD;  Location: [place];  Service: Orthopedics;  Laterality: Left;  . REVERSE SHOULDER ARTHROPLASTY Right 07/07/2014   Procedure: RIGHT REVERSE TOTAL SHOULDER ;  Surgeon: Netta Cedars, MD;  Location: [place];  Service: Orthopedics;  Laterality: Right;  . REVERSE TOTAL SHOULDER ARTHROPLASTY Right 07/07/2014   Dr Veverly Fells  . ROTATOR CUFF REPAIR     Bilateral   . TEE WITHOUT CARDIOVERSION N/A 02/06/2017   Procedure: TRANSESOPHAGEAL ECHOCARDIOGRAM (TEE);  Surgeon: Jerline Pain, MD;  Location[facility] ENDOSCOPY;  Service: Cardiovascular;  Laterality: N/A;  . TONSILLECTOMY    . tumor removed from bladder    . VEIN HARVEST Right 09/19/2015   Procedure: WITH NON REVERSE RIGHT GREATER SAPHENOUS VEIN HARVEST;  Surgeon: Conrad Furman, MD;  Location: [place];  Service: Vascular;  Laterality: Right;    Social History:   Social History   Socioeconomic History  . Marital status: Married    Spouse name: Not on file  . Number of children: Not on file  . Years of education: Not on file  . Highest education level: Not on file  Occupational History  . Not on file  Tobacco Use  . Smoking status: Current Some Day Smoker    Packs/day: 1.00    Years: 45.00    Pack years: 45.00    Types: Cigars  . Smokeless tobacco: Never Used  Substance and Sexual Activity  . Alcohol use: Yes    Alcohol/week: 0.0 standard drinks    Comment: wine occasionally  . Drug use: Not Currently    Types: Marijuana  . Sexual activity: Yes  Other Topics Concern  . Not on file  Social History Narrative   Pt lives w/ wife in [place], [place]   Social Determinants of Health    Financial Resource Strain:   . Difficulty of Paying Living Expenses: Not on file  Food Insecurity:   . Worried About Charity fundraiser in the Last Year: Not on file  . Ran Out of Food in the Last Year: Not on file  Transportation Needs:   . Lack of Transportation (Medical): Not on file  . Lack of Transportation (Non-Medical): Not on file  Physical Activity:   . Days of Exercise per Week: Not on file  .  Minutes of Exercise per Session: Not on file  Stress:   . Feeling of Stress : Not on file  Social Connections:   . Frequency of Communication with Friends and Family: Not on file  . Frequency of Social Gatherings with Friends and Family: Not on file  . Attends Religious Services: Not on file  . Active Member of Clubs or Organizations: Not on file  . Attends Archivist Meetings: Not on file  . Marital Status: Not on file  Intimate Partner Violence:   . Fear of Current or Ex-Partner: Not on file  . Emotionally Abused: Not on file  . Physically Abused: Not on file  . Sexually Abused: Not on file    Allergies   Adhesive [tape], Aspartame and phenylalanine, Other, and Sulfonamide derivatives  Family history:   Family History  Problem Relation Age of Onset  . Parkinson's disease Mother   . AAA (abdominal aortic aneurysm) Father   . Heart defect Sister        Congenital Heart Disease  . Heart disease Sister     Current Medications:   Prior to Admission medications   Medication Sig Start Date End Date Taking? Authorizing Provider  acetaminophen (TYLENOL) 500 MG tablet Take 1,000 mg by mouth every 6 (six) hours as needed (for pain or headaches).    Yes [provider]  albuterol (PROVENTIL HFA;VENTOLIN HFA) 108 (90 BASE) MCG/ACT inhaler Inhale 1-2 puffs into the lungs every 6 (six) hours as needed for wheezing or shortness of breath. Reported on 10/03/2015   Yes [provider]  amLODipine (NORVASC) 10 MG tablet Take 1 tablet (10 mg total) by  mouth daily. Patient taking differently: 1/2 tab twice daily 01/05/18  Yes Lavina Hamman, MD  apixaban (ELIQUIS) 5 MG TABS tablet Take 1 tablet (5 mg total) 2 (two) times daily by mouth. 02/08/17  Yes Mikhail, Velta Addison, DO  budesonide-formoterol (SYMBICORT) 160-4.5 MCG/ACT inhaler Inhale 2 puffs into the lungs 2 (two) times daily. 01/04/18  Yes Lavina Hamman, MD  diphenhydrAMINE (BENADRYL) 25 MG tablet Take 25 mg by mouth every 6 (six) hours as needed for itching or allergies.    Yes [provider]  docusate sodium (COLACE) 100 MG capsule Take 1 capsule (100 mg total) by mouth 2 (two) times daily. Patient taking differently: Take 100 mg by mouth as needed.  01/04/18  Yes Lavina Hamman, MD  famotidine (PEPCID) 20 MG tablet One at bedtime Patient taking differently: Take 20 mg by mouth at bedtime.  05/11/17  Yes Tanda Rockers, MD  ipratropium-albuterol (DUONEB) 0.5-2.5 (3) MG/3ML SOLN Take 3 mLs by nebulization every 4 (four) hours as needed. 01/04/18  Yes Lavina Hamman, MD  metoprolol tartrate (LOPRESSOR) 25 MG tablet One half twice daily 02/02/18  Yes Tanda Rockers, MD  montelukast (SINGULAIR) 10 MG tablet TAKE (1) TABLET BY MOUTH AT BEDTIME. 08/12/18  Yes Tanda Rockers, MD  pantoprazole (PROTONIX) 40 MG tablet TAKE 1 TABLET BY MOUTH 30 TO 60 MINUTES PRIOR TO THE FIRST MEAL OF THE DAY. 09/15/18  Yes Tanda Rockers, MD  Tamsulosin HCl (FLOMAX) 0.4 MG CAPS Take 0.4 mg by mouth daily after supper.    Yes [provider]  polyethylene glycol-electrolytes (TRILYTE) 420 g solution Take 4,000 mLs by mouth as directed. Patient not taking: Reported on 03/11/2019 02/16/19   Daneil Dolin, MD  predniSONE (DELTASONE) 10 MG tablet Take 10 mg by mouth as directed. Dose pack  [provider]    Physical Exam:   Vitals:   03/11/19 1245 03/11/19 1300 03/11/19 1330 03/11/19 1400  BP:  100/79 133/72 (!) 149/78  Pulse:  80 71 72  Resp:  (!) 27 (!) 35 (!) 26  Temp: 98  F (36.7 C)     TempSrc: Oral     SpO2:  100% 100% 95%  Weight:      Height:         Physical Exam: Blood pressure (!) 149/78, pulse 72, temperature 98 F (36.7 C), temperature source Oral, resp. rate (!) 26, height 5\' 11"  (1.803 m), weight 110.7 kg, SpO2 95 %. Gen: Barrel chested man sitting up at 40 degrees with tachypnea but unlabored breathing.  Patient able to speak in short sentences without difficulty. Eyes: Sclerae anicteric. Conjunctiva mildly injected. Chest: Decreased air entry bilaterally with tight high-pitched wheezes scattered.  No rales.  CV: Distant, regular, no audible murmurs. Abdomen: NABS, firm but not hard with moderate distention, nontender.  Extremities: Patient has trace to 1+ edema in dependent areas all the way up to his hips bilaterally although right lower extremity with increased edema compared to left.  Patient states this is his baseline. Skin: Warm and dry. No rashes, lesions or wounds. Neuro: Alert and oriented times 3; grossly nonfocal. Psych: Patient is cooperative, logical and coherent with appropriate mood and affect.  Data Review:    Labs: Basic Metabolic Panel: Recent Labs  Lab 03/11/19 1255  NA 141  K 3.8  CL 106  CO2 26  GLUCOSE 147*  BUN 11  CREATININE 1.00  CALCIUM 9.5   Liver Function Tests: Recent Labs  Lab 03/11/19 1255  AST 28  ALT 29  ALKPHOS 112  BILITOT 0.5  PROT 7.6  ALBUMIN 3.6   No results for input(s): LIPASE, AMYLASE in the last 168 hours. No results for input(s): AMMONIA in the last 168 hours. CBC: Recent Labs  Lab 03/11/19 1255  WBC 9.9  NEUTROABS 6.6  HGB 10.3*  HCT 38.0*  MCV 73.9*  PLT 454*   Cardiac Enzymes: No results for input(s): CKTOTAL, CKMB, CKMBINDEX, TROPONINI in the last 168 hours.  BNP (last 3 results) No results for input(s): PROBNP in the last 8760 hours. CBG: No results for input(s): GLUCAP in the last 168 hours.  Urinalysis    Component Value Date/Time   COLORURINE  YELLOW 09/14/2015 [place] 09/14/2015 0934   LABSPEC 1.028 09/14/2015 0934   PHURINE 5.0 09/14/2015 0934   GLUCOSEU NEGATIVE 09/14/2015 0934   HGBUR NEGATIVE 09/14/2015 0934   BILIRUBINUR NEGATIVE 09/14/2015 0934   KETONESUR NEGATIVE 09/14/2015 0934   PROTEINUR NEGATIVE 09/14/2015 0934   UROBILINOGEN 0.2 10/06/2008 0800   NITRITE NEGATIVE 09/14/2015 0934   LEUKOCYTESUR NEGATIVE 09/14/2015 0934      Radiographic Studies: DG Chest Port 1 View  Result Date: 03/11/2019 CLINICAL DATA:  Shortness of breath EXAM: PORTABLE CHEST 1 VIEW COMPARISON:  02/01/2019 FINDINGS: Cardiomediastinal contours are stable. Lungs are clear aside from linear basilar opacity particularly over the right hemidiaphragm. Signs of right shoulder arthroplasty as before. No acute bone finding. IMPRESSION: Subtle right basilar opacity, likely atelectasis no signs of consolidation or evidence of pleural effusion. Electronically Signed   By: Zetta Bills M.D.   On: 03/11/2019 13:39    EKG: Independently reviewed.  NSR at 75.  Normal intervals.  Normal axis.  Normal EKG.   Assessment/Plan:   Principal Problem:   Acute on chronic respiratory failure (  [facility]) Active Problems:   Essential hypertension   PAF (paroxysmal atrial fibrillation) ([facility])   COPD exacerbation ([facility])   IDA (iron deficiency anemia)   Enlarged prostate   GERD (gastroesophageal reflux disease)   78 year old with advanced COPD comes in with increased DOE and shortness of breath at rest most likely secondary to acute exacerbation. Patient seems to be becoming steroid-dependent and that he is needed 3 forces of steroids over the past 3 to 4 months.  Of note patient has gained 44 pounds over the past 3 months per his report, likely decompensated cor pulmonale.  Increased weight gain is likely also contributing to his DOE.  DYSPNEA Likely secondary to acute exacerbation of COPD as well as weight gain possibly secondary to decompensated  cor pulmonale and increased interstitial edema. Of note BNP is essentially within normal limits, ruling out LV strain/dysfunction.  COPD EXACERBATION We will treat with steroids and inhaled bronchodilators Covid test is now negative, will start patient on duo nebs every 6 hours and albuterol every 2h prn. Patient received Solu-Medrol 125 in the ED, will continue Solu-Medrol 60 every 6h.  WEIGHT GAIN/POSSIBLE COR PULMONALE Will repeat echocardiogram to look for possible pulmonary hypertension and/or RV dysfunction, last echo was in 2018. Order Lasix 40 mg IV daily. Follow electrolytes closely.  HTN Continue amlodipine and Lopressor per home doses  PAF Presently in sinus rhythm. Continue Eliquis Patient is on Lopressor for rate control which I am continuing.  BPH Continue flomax   GERD Continue Pepcid and pantoprazole      Other information:   DVT prophylaxis: On Eliquis Code Status: Full code. Family Communication: Patient states no need to contact family. Disposition Plan: Home Consults called: None Admission status: Inpatient   The medical decision making is of moderate complexity, therefore this is a level 2 visit.  Dewaine Oats Tublu Kimberlye Dilger [facility]  If 7PM-7AM, please contact night-coverage [email or web address] Password [email or web address] 03/11/2019, 4:09 PM

## 2019-03-11 NOTE — ED Triage Notes (Signed)
Patient sent by Dr. Ria Comment office for SOB and increasing weight. Patient having grunting breathe sounds. 88% on RA.

## 2019-03-11 NOTE — ED Notes (Addendum)
Date and time results received: 03/11/19 2024  Test: Lactic Acid  Critical Value: 2.0  Name of Provider Notified: Bero   Orders Received? Or Actions Taken?: na

## 2019-03-11 NOTE — ED Provider Notes (Signed)
[facility] EMERGENCY DEPARTMENT Provider Note   CSN: [id number] Arrival date & time: 03/11/19  1223     History Chief Complaint  Patient presents with  . Shortness of Breath    Derrick Moon is a 78 y.o. male with history of AAA, bladder cancer, COPD, diverticulitis, GERD, migraine headaches, hypertension, peripheral arterial disease, paroxysmal A. fib on Eliquis presents for evaluation of acute onset, progressively worsening shortness of breath for the last 3 to 4 weeks.  He reports that he has really been feeling short of breath for the last several years but acutely worsening over the last few weeks.  Also notes bilateral lower extremity edema worsening over the last 2 to 3 weeks.  Reports that he has been evaluated for the symptoms by his PCP, was noted to be anemic and is scheduled for a colonoscopy in January.  He denies melena or hematochezia.  He also had chest x-ray on outpatient basis in November which was negative and ultrasound of the abdomen which showed fatty liver disease but no evidence of ascites.  However he does feel that his abdomen is distended.  He reports significant dyspnea on exertion, orthopnea, PND.  He denies fevers.  He has chronic cough which he reports is unchanged.  He has been taking short courses of Lasix without improvement, using albuterol inhaler 4-5 times daily with mild temporary relief.  No known sick contacts.  He tells me that he is on 2.5 L supplemental oxygen as needed but has been wearing it constantly for the last week noting that his O2 saturations drop to 88% on room air without it.  No recent travel or surgeries, no hemoptysis, no prior history of DVT or PE, he is not on hormone replacement therapy.  The history is provided by the patient.       Past Medical History:  Diagnosis Date  . AAA (abdominal aortic aneurysm) ([place])   . Anxiety    due to pain in leg  . Arthritis    Osteo  . Bell's palsy   . Cancer [facility])    Bladder cancer  .  Cataracts, bilateral   . COPD (chronic obstructive pulmonary disease) ([facility])    Albuterol inhaler as needed  . Diverticulitis   . Enlarged prostate    takes Flomax daily  . GERD (gastroesophageal reflux disease)    was on Prilosec but has been off for yrs-no issues now with reflux  . Headache    migraines years ago  . History of bronchitis    > 65yrs ago  . History of colon polyps    benign  . History of dizziness    several yrs ago-was on Meclizine but has been off for several yrs-no problems since  . History of hiatal hernia   . Hypertension    takes Amlodipine and Lisinpril daily  . Joint pain   . Joint swelling   . Nocturia   . PAD (peripheral artery disease) (HCC)     bilateral superficial femoral artery occlusions   . Paroxysmal atrial fibrillation (H[facility])   . Pneumonia    several times with most recent within 5 yrs ago  . Restless leg syndrome   . Rupture of left quadriceps tendon   . Shortness of breath dyspnea    with exertion  . Sleep apnea    does not use cpap  . Urinary urgency     Patient Active Problem List   Diagnosis Date Noted  . Enlarged prostate   .  GERD (gastroesophageal reflux disease)   . B12 deficiency 02/10/2019  . IDA (iron deficiency anemia) 02/08/2019  . Abdominal distention 02/08/2019  . Tobacco use 01/01/2018  . COPD exacerbation ([place]) 12/31/2017  . Otitis media 12/31/2017  . Bronchiectasis without complication ([place]) [id number]  . Acute maxillary sinusitis 08/20/2017  . Cough variant asthma vs uacs  07/17/2017  . Abnormal thyroid blood test 05/07/2017  . Pulmonary infiltrates 02/20/2017  . PAF (paroxysmal atrial fibrillation) ([place]) 02/02/2017  . COPD with acute exacerbation ([place]) 02/02/2017  . Acute respiratory failure with hypoxemia ([place]) 02/02/2017  . Atrial fibrillation with RVR ([place]) 02/02/2017  . Acute on chronic respiratory failure ([place]) 02/02/2017  . SIRS (systemic inflammatory response syndrome) ([place]) 02/02/2017  . Cigarette  smoker 12/14/2016  . DOE (dyspnea on exertion) 12/12/2015  . COPD GOLD II  12/12/2015  . Atherosclerosis of native arteries of right leg with ulceration of other part of foot ([place]) 09/19/2015  . Atherosclerosis of native arteries of the extremities with ulceration ([place]) 07/27/2015  . S/P shoulder replacement 07/07/2014  . Aortic ectasia, abdominal ([place]) 02/26/2012  . Essential hypertension 02/26/2009  . DIVERTICULITIS, HX OF 02/26/2009    Past Surgical History:  Procedure Laterality Date  . BICEPS TENDON REPAIR     LEFT  . CARDIOVERSION N/A 02/06/2017   Procedure: CARDIOVERSION;  Surgeon: Jerline Pain, MD;  Location: [facility] ENDOSCOPY;  Service: Cardiovascular;  Laterality: N/A;  . CARPAL TUNNEL RELEASE Right 2014  . CHOLECYSTECTOMY  July 16,2010  . COLONOSCOPY    . CYSTOSCOPY    . ESOPHAGOGASTRODUODENOSCOPY    . FEMORAL-POPLITEAL BYPASS GRAFT Right 09/19/2015   Procedure: RIGHT COMMON FEMORAL-ABOVE KNEE POPLITEAL ARTERY    BYPASS GRAFT ;  Surgeon: Conrad Yakutat, MD;  Location: [place];  Service: Vascular;  Laterality: Right;  . FOOT SURGERY Right    x 5t  . HAND SURGERY Right   . HERNIA REPAIR Bilateral    inguinal  . PERIPHERAL VASCULAR CATHETERIZATION N/A 08/02/2015   Procedure: Abdominal Aortogram w/Lower Extremity;  Surgeon: Conrad Troy, MD;  Location: [place] [facility];  Service: Cardiovascular;  Laterality: N/A;  . QUADRICEPS TENDON REPAIR Left 08/22/2016   Procedure: LEFT OPEN QUADRICEP TENDON REPAIR;  Surgeon: Netta Cedars, MD;  Location: [place];  Service: Orthopedics;  Laterality: Left;  . REVERSE SHOULDER ARTHROPLASTY Right 07/07/2014   Procedure: RIGHT REVERSE TOTAL SHOULDER ;  Surgeon: Netta Cedars, MD;  Location: [place];  Service: Orthopedics;  Laterality: Right;  . REVERSE TOTAL SHOULDER ARTHROPLASTY Right 07/07/2014   Dr Veverly Fells  . ROTATOR CUFF REPAIR     Bilateral   . TEE WITHOUT CARDIOVERSION N/A 02/06/2017   Procedure: TRANSESOPHAGEAL ECHOCARDIOGRAM (TEE);  Surgeon:  Jerline Pain, MD;  Location: [facility] ENDOSCOPY;  Service: Cardiovascular;  Laterality: N/A;  . TONSILLECTOMY    . tumor removed from bladder    . VEIN HARVEST Right 09/19/2015   Procedure: WITH NON REVERSE RIGHT GREATER SAPHENOUS VEIN HARVEST;  Surgeon: Conrad Sharp, MD;  Location: [place];  Service: Vascular;  Laterality: Right;       Family History  Problem Relation Age of Onset  . Parkinson's disease Mother   . AAA (abdominal aortic aneurysm) Father   . Heart defect Sister        Congenital Heart Disease  . Heart disease Sister     Social History   Tobacco Use  . Smoking status: Current Some Day Smoker    Packs/day: 1.00    Years:  45.00    Pack years: 45.00    Types: Cigars  . Smokeless tobacco: Never Used  Substance Use Topics  . Alcohol use: Yes    Alcohol/week: 0.0 standard drinks    Comment: wine occasionally  . Drug use: Not Currently    Types: Marijuana    Home Medications Prior to Admission medications   Medication Sig Start Date End Date Taking? Authorizing Provider  acetaminophen (TYLENOL) 500 MG tablet Take 1,000 mg by mouth every 6 (six) hours as needed (for pain or headaches).    Yes [provider]  albuterol (PROVENTIL HFA;VENTOLIN HFA) 108 (90 BASE) MCG/ACT inhaler Inhale 1-2 puffs into the lungs every 6 (six) hours as needed for wheezing or shortness of breath. Reported on 10/03/2015   Yes [provider]  amLODipine (NORVASC) 10 MG tablet Take 1 tablet (10 mg total) by mouth daily. Patient taking differently: 1/2 tab twice daily 01/05/18  Yes Lavina Hamman, MD  apixaban (ELIQUIS) 5 MG TABS tablet Take 1 tablet (5 mg total) 2 (two) times daily by mouth. 02/08/17  Yes Mikhail, Velta Addison, DO  budesonide-formoterol (SYMBICORT) 160-4.5 MCG/ACT inhaler Inhale 2 puffs into the lungs 2 (two) times daily. 01/04/18  Yes Lavina Hamman, MD  diphenhydrAMINE (BENADRYL) 25 MG tablet Take 25 mg by mouth every 6 (six) hours as needed for itching or  allergies.    Yes [provider]  docusate sodium (COLACE) 100 MG capsule Take 1 capsule (100 mg total) by mouth 2 (two) times daily. Patient taking differently: Take 100 mg by mouth as needed.  01/04/18  Yes Lavina Hamman, MD  famotidine (PEPCID) 20 MG tablet One at bedtime Patient taking differently: Take 20 mg by mouth at bedtime.  05/11/17  Yes Tanda Rockers, MD  ipratropium-albuterol (DUONEB) 0.5-2.5 (3) MG/3ML SOLN Take 3 mLs by nebulization every 4 (four) hours as needed. 01/04/18  Yes Lavina Hamman, MD  metoprolol tartrate (LOPRESSOR) 25 MG tablet One half twice daily 02/02/18  Yes Tanda Rockers, MD  montelukast (SINGULAIR) 10 MG tablet TAKE (1) TABLET BY MOUTH AT BEDTIME. 08/12/18  Yes Tanda Rockers, MD  pantoprazole (PROTONIX) 40 MG tablet TAKE 1 TABLET BY MOUTH 30 TO 60 MINUTES PRIOR TO THE FIRST MEAL OF THE DAY. 09/15/18  Yes Tanda Rockers, MD  Tamsulosin HCl (FLOMAX) 0.4 MG CAPS Take 0.4 mg by mouth daily after supper.    Yes [provider]  polyethylene glycol-electrolytes (TRILYTE) 420 g solution Take 4,000 mLs by mouth as directed. Patient not taking: Reported on 03/11/2019 02/16/19   Daneil Dolin, MD  predniSONE (DELTASONE) 10 MG tablet Take 10 mg by mouth as directed. Dose pack    [provider]    Allergies    Adhesive [tape], Aspartame and phenylalanine, Other, and Sulfonamide derivatives  Review of Systems   Review of Systems  Constitutional: Positive for fatigue. Negative for chills and fever.  Respiratory: Positive for chest tightness and shortness of breath.   Cardiovascular: Positive for leg swelling.  Gastrointestinal: Positive for abdominal distention. Negative for abdominal pain, diarrhea, nausea and vomiting.  All other systems reviewed and are negative.   Physical Exam Updated Vital Signs BP (!) 149/78   Pulse 72   Temp 98 F (36.7 C) (Oral)   Resp (!) 26   Ht 5\' 11"  (1.803 m)   Wt 110.7 kg   SpO2 95%   BMI  34.03 kg/m   Physical Exam Vitals and nursing note  reviewed.  Constitutional:      General: He is not in acute distress.    Appearance: He is well-developed.  HENT:     Head: Normocephalic and atraumatic.  Eyes:     General:        Right eye: No discharge.        Left eye: No discharge.     Conjunctiva/sclera: Conjunctivae normal.  Neck:     Vascular: No JVD.     Trachea: No tracheal deviation.  Cardiovascular:     Rate and Rhythm: Normal rate and regular rhythm.     Pulses: Normal pulses.     Comments: 2+ radial and DP/PT pulses bilaterally, Homans sign absent bilaterally, 2+ pitting edema of the bilateral lower extremities, right slightly worse than left, no palpable cords, compartments are soft  Pulmonary:     Effort: Tachypnea present.     Comments: Speaking in short phrases.  SPO2 saturations 100% on 2 and half liters supplemental oxygen via nasal cannula.  Diffuse expiratory wheezes, diminished breath sounds in the lung bases bilaterally. Abdominal:     General: Abdomen is protuberant. Bowel sounds are decreased. There is no distension.     Tenderness: There is no abdominal tenderness.  Musculoskeletal:     Right lower leg: No tenderness. Edema present.     Left lower leg: No tenderness. Edema present.  Skin:    General: Skin is warm and dry.     Findings: No erythema.  Neurological:     Mental Status: He is alert.  Psychiatric:        Behavior: Behavior normal.     ED Results / Procedures / Treatments   Labs (all labs ordered are listed, but only abnormal results are displayed) Labs Reviewed  COMPREHENSIVE METABOLIC PANEL - Abnormal; Notable for the following components:      Result Value   Glucose, Bld 147 (*)    All other components within normal limits  CBC WITH DIFFERENTIAL/PLATELET - Abnormal; Notable for the following components:   Hemoglobin 10.3 (*)    HCT 38.0 (*)    MCV 73.9 (*)    MCH 20.0 (*)    MCHC 27.1 (*)    RDW 21.7 (*)    Platelets 454  (*)    Abs Immature Granulocytes 0.14 (*)    All other components within normal limits  RESPIRATORY PANEL BY RT PCR (FLU A&B, COVID)  CULTURE, BLOOD (ROUTINE X 2)  CULTURE, BLOOD (ROUTINE X 2)  BRAIN NATRIURETIC PEPTIDE  LACTIC ACID, PLASMA  LACTIC ACID, PLASMA  D-DIMER, QUANTITATIVE (NOT AT [facility])  PROCALCITONIN  LACTATE DEHYDROGENASE  FERRITIN  TRIGLYCERIDES  FIBRINOGEN  C-REACTIVE PROTEIN  POC SARS CORONAVIRUS 2 AG -  ED  TROPONIN I (HIGH SENSITIVITY)  TROPONIN I (HIGH SENSITIVITY)    EKG None  Radiology DG Chest Port 1 View  Result Date: 03/11/2019 CLINICAL DATA:  Shortness of breath EXAM: PORTABLE CHEST 1 VIEW COMPARISON:  02/01/2019 FINDINGS: Cardiomediastinal contours are stable. Lungs are clear aside from linear basilar opacity particularly over the right hemidiaphragm. Signs of right shoulder arthroplasty as before. No acute bone finding. IMPRESSION: Subtle right basilar opacity, likely atelectasis no signs of consolidation or evidence of pleural effusion. Electronically Signed   By: Zetta Bills M.D.   On: 03/11/2019 13:39    Procedures .Critical Care Performed by: Renita Papa, PA-C Authorized by: Renita Papa, PA-C   Critical care provider statement:    Critical care time (minutes):  40  Critical care was necessary to treat or prevent imminent or life-threatening deterioration of the following conditions:  Respiratory failure   Critical care was time spent personally by me on the following activities:  Discussions with consultants, evaluation of patient's response to treatment, examination of patient, ordering and performing treatments and interventions, ordering and review of laboratory studies, ordering and review of radiographic studies, pulse oximetry, re-evaluation of patient's condition, obtaining history from patient or surrogate and review of old charts   (including critical care time)  Medications Ordered in ED Medications  magnesium sulfate IVPB  2 g 50 mL (2 g Intravenous New Bag/Given 03/11/19 1557)  furosemide (LASIX) injection 40 mg (40 mg Intravenous Given 03/11/19 1401)  albuterol (VENTOLIN HFA) 108 (90 Base) MCG/ACT inhaler 2 puff (2 puffs Inhalation Given 03/11/19 1401)  methylPREDNISolone sodium succinate (SOLU-MEDROL) 125 mg/2 mL injection 125 mg (125 mg Intravenous Given 03/11/19 1401)  HYDROmorphone (DILAUDID) injection 0.5 mg (0.5 mg Intravenous Given 03/11/19 1554)    ED Course  I have reviewed the triage vital signs and the nursing notes.  Pertinent labs & imaging results that were available during my care of the patient were reviewed by me and considered in my medical decision making (see chart for details).    MDM Rules/Calculators/A&P                      Derrick Moon was evaluated in Emergency Department on 03/11/2019 for the symptoms described in the history of present illness. He was evaluated in the context of the global COVID-19 pandemic, which necessitated consideration that the patient might be at risk for infection with the SARS-CoV-2 virus that causes COVID-19. Institutional protocols and algorithms that pertain to the evaluation of patients at risk for COVID-19 are in a state of rapid change based on information released by regulatory bodies including the CDC and federal and state organizations. These policies and algorithms were followed during the patient's care in the ED.  Patient presenting for evaluation of progressively worsening shortness of breath, bilateral lower extremity edema.  Hypoxic to 88% on room air.  Stable on 2 and half liters supplemental oxygen via nasal cannula.  Diffuse expiratory wheezes with diminished breath sounds in the lung bases on auscultation of the lungs.  Chest x-ray shows right basilar opacity which could be atelectasis versus pneumonia.  However he has no fever, no leukocytosis.  He has anemia which is stable compared to recent labs.  Remainder blood work reviewed by me shows  no renal insufficiency, no metabolic derangements.  POC Covid test is negative, will obtain rapid.  EKG shows normal sinus rhythm, initial troponin is negative, will obtain serial troponins however his chest pain does not sound cardiac in etiology.  His BNP was within normal limits however he does have findings concerning for volume overload so he was given a dose of IV Lasix in the ED with good urine output.  He was also given 4 puffs of albuterol and IV Solu-Medrol as well as IV magnesium with improvement in respiratory status but still requires supplemental oxygen.  Suspect he has a COPD exacerbation or less likely CHF exacerbation.  Spoke with Dr. Jamse Arn with [facility] hospitalist service who agrees to assume care of patient and bring him into the hospital for further evaluation and management.  She requests Covid inflammatory markers be added on to workup.  Final Clinical Impression(s) / ED Diagnoses Final diagnoses:  Acute on chronic respiratory failure with hypoxia ([place])  Rx / DC Orders ED Discharge Orders    None       Debroah Baller 03/11/19 1658    Elnora Morrison, MD 03/12/19 [phone]

## 2019-03-12 ENCOUNTER — Inpatient Hospital Stay (HOSPITAL_COMMUNITY): Payer: PPO

## 2019-03-12 DIAGNOSIS — R0602 Shortness of breath: Secondary | ICD-10-CM

## 2019-03-12 LAB — BASIC METABOLIC PANEL
Anion gap: 7 (ref 5–15)
BUN: 16 mg/dL (ref 8–23)
CO2: 28 mmol/L (ref 22–32)
Calcium: 9.1 mg/dL (ref 8.9–10.3)
Chloride: 104 mmol/L (ref 98–111)
Creatinine, Ser: 1.02 mg/dL (ref 0.61–1.24)
GFR calc Af Amer: >60 mL/min (ref >60)
GFR calc non Af Amer: >60 mL/min (ref >60)
Glucose, Bld: 176 mg/dL — ABNORMAL HIGH (ref 70–99)
Potassium: 3.9 mmol/L (ref 3.5–5.1)
Sodium: 139 mmol/L (ref 135–145)

## 2019-03-12 LAB — ECHOCARDIOGRAM COMPLETE
Height: 71 in
Weight: 3904 oz

## 2019-03-12 LAB — CBC
HCT: 35.2 % — ABNORMAL LOW (ref 39.0–52.0)
Hemoglobin: 9.4 g/dL — ABNORMAL LOW (ref 13.0–17.0)
MCH: 19.5 pg — ABNORMAL LOW (ref 26.0–34.0)
MCHC: 26.7 g/dL — ABNORMAL LOW (ref 30.0–36.0)
MCV: 73.2 fL — ABNORMAL LOW (ref 80.0–100.0)
Platelets: 438 10*3/uL — ABNORMAL HIGH (ref 150–400)
RBC: 4.81 MIL/uL (ref 4.22–5.81)
RDW: 21 % — ABNORMAL HIGH (ref 11.5–15.5)
WBC: 11.4 10*3/uL — ABNORMAL HIGH (ref 4.0–10.5)
nRBC: 0 % (ref 0.0–0.2)

## 2019-03-12 MED ORDER — GUAIFENESIN ER 600 MG PO TB12
600.0000 mg | ORAL_TABLET | Freq: Two times a day (BID) | ORAL | Status: DC
  Administered 2019-03-12 – 2019-03-14 (×4): 600 mg via ORAL
  Filled 2019-03-12 (×4): qty 1

## 2019-03-12 MED ORDER — HYDROXYZINE HCL 25 MG PO TABS
25.0000 mg | ORAL_TABLET | Freq: Once | ORAL | Status: AC
  Administered 2019-03-12: 25 mg via ORAL
  Filled 2019-03-12: qty 1

## 2019-03-12 MED ORDER — METHYLPREDNISOLONE SODIUM SUCC 40 MG IJ SOLR
40.0000 mg | Freq: Three times a day (TID) | INTRAMUSCULAR | Status: DC
  Administered 2019-03-12 – 2019-03-14 (×6): 40 mg via INTRAVENOUS
  Filled 2019-03-12 (×6): qty 1

## 2019-03-12 MED ORDER — HYDROXYZINE HCL 25 MG PO TABS
25.0000 mg | ORAL_TABLET | Freq: Every day | ORAL | Status: DC
  Administered 2019-03-12 – 2019-03-13 (×2): 25 mg via ORAL
  Filled 2019-03-12 (×2): qty 1

## 2019-03-12 NOTE — Plan of Care (Signed)
  Problem: Health Behavior/Discharge Planning: Goal: Ability to manage health-related needs will improve Outcome: Progressing   

## 2019-03-12 NOTE — Progress Notes (Signed)
Patient Demographics:    Derrick Moon, is a 78 y.o. male, DOB - Jul 12, 1940, [id number]  Admit date - 03/11/2019   Admitting Physician Vashti Hey, MD  Outpatient Primary MD for the patient is Derrick Noble, MD  LOS - 1   Chief Complaint  Patient presents with  . Shortness of Breath        Subjective:    Wlliam Moon today has no fevers, no emesis,  No chest pain,   --Cough and shortness of breath persist -No pleuritic symptoms  Assessment  & Plan :    Principal Problem:   Acute on chronic respiratory failure (HCC) Active Problems:   Essential hypertension   PAF (paroxysmal atrial fibrillation) (HCC)   COPD exacerbation (HCC)   IDA (iron deficiency anemia)   Enlarged prostate   GERD (gastroesophageal reflux disease)  Brief Summary:- 78 y.o. male with Gold III COPD admitted on 03/11/2019 with acute COPD exacerbation  A/p 1) acute COPD exacerbation----cough and dyspnea persist --- Continue bronchodilators, IV Solu-Medrol and mucolytics -Oxygen supplementation  2)H/o PAFib--continue metoprolol 4.5 mg twice daily for rate control, echo with EF of 65%, continue Eliquis for stroke prophylaxis  3)HTN--continue amlodipine  4) acute on chronic hypoxic respiratory failure--PTA patient used oxygen as needed not requiring oxygen continuously -Treat as above #1  Disposition/Need for in-Hospital Stay- patient unable to be discharged at this time due to --- pending improvement in respiratory status  Code Status : Full  Family Communication:   NA (patient is alert, awake and coherent)   Disposition Plan  : TBD  Consults  :  na  DVT Prophylaxis  : Eliquis - SCDs   Lab Results  Component Value Date   PLT 438 (H) 03/12/2019    Inpatient Medications  Scheduled Meds: . amLODipine  10 mg Oral Daily  . apixaban  5 mg Oral BID  . famotidine  20 mg Oral QHS  . furosemide  40  mg Intravenous Daily  . ipratropium-albuterol  3 mL Nebulization Q6H  . methylPREDNISolone (SOLU-MEDROL) injection  60 mg Intravenous Q6H  . metoprolol tartrate  12.5 mg Oral BID  . montelukast  10 mg Oral QHS  . pantoprazole  40 mg Oral Daily  . tamsulosin  0.4 mg Oral QPC supper   Continuous Infusions: PRN Meds:.acetaminophen, albuterol, docusate sodium    Anti-infectives (From admission, onward)   None        Objective:   Vitals:   03/12/19 0606 03/12/19 0807 03/12/19 1314 03/12/19 1345  BP: 136/66  121/70   Pulse: 87  80   Resp: 20     Temp:   98.1 F (36.7 C)   TempSrc:   Oral   SpO2:  98% 97% 96%  Weight:      Height:        Wt Readings from Last 3 Encounters:  03/11/19 110.7 kg  02/08/19 107 kg  05/28/18 90.4 kg     Intake/Output Summary (Last 24 hours) at 03/12/2019 1813 Last data filed at 03/12/2019 1302 Gross per 24 hour  Intake 480 ml  Output 2200 ml  Net -1720 ml     Physical Exam  Gen:- Awake Alert,  In no apparent distress  HEENT:- [place].AT, No sclera icterus Nose-  [place] 2 L/min Neck-Supple Neck,No JVD,.  Lungs-diminished bilaterally with scattered wheezes CV- S1, S2 normal, irregular Abd-  +ve B.Sounds, Abd Soft, No tenderness,    Extremity/Skin:- No  edema, pedal pulses present  Psych-affect is appropriate, oriented x3 Neuro-no new focal deficits, no tremors   Data Review:   Micro Results Recent Results (from the past 240 hour(s))  Blood Culture (routine x 2)     Status: None (Preliminary result)   Collection Time: 03/11/19  5:20 PM   Specimen: Left Antecubital; Blood  Result Value Ref Range Status   Specimen Description LEFT ANTECUBITAL  Final   Special Requests   Final    BOTTLES DRAWN AEROBIC AND ANAEROBIC Blood Culture adequate volume   Culture   Final    NO GROWTH < 12 HOURS Performed at [facility], [address] [place], [place] [address]    Report Status PENDING  Incomplete  Blood Culture (routine x 2)     Status: None  (Preliminary result)   Collection Time: 03/11/19  5:20 PM   Specimen: BLOOD LEFT HAND  Result Value Ref Range Status   Specimen Description BLOOD LEFT HAND  Final   Special Requests   Final    BOTTLES DRAWN AEROBIC AND ANAEROBIC Blood Culture adequate volume   Culture   Final    NO GROWTH < 12 HOURS Performed at [facility], [address], [place], [place] [address]    Report Status PENDING  Incomplete  Respiratory Panel by RT PCR (Flu A&B, Covid) - Nasopharyngeal Swab     Status: None   Collection Time: 03/11/19  6:25 PM   Specimen: Nasopharyngeal Swab  Result Value Ref Range Status   SARS Coronavirus 2 by RT PCR NEGATIVE NEGATIVE Final    Comment: (NOTE) SARS-CoV-2 target nucleic acids are NOT DETECTED. The SARS-CoV-2 RNA is generally detectable in upper respiratoy specimens during the acute phase of infection. The lowest concentration of SARS-CoV-2 viral copies this assay can detect is 131 copies/mL. A negative result does not preclude SARS-Cov-2 infection and should not be used as the sole basis for treatment or other patient management decisions. A negative result may occur with  improper specimen collection/handling, submission of specimen other than nasopharyngeal swab, presence of viral mutation(s) within the areas targeted by this assay, and inadequate number of viral copies (<131 copies/mL). A negative result must be combined with clinical observations, patient history, and epidemiological information. The expected result is Negative. Fact Sheet for Patients:  PinkCheek.be Fact Sheet for Healthcare Providers:  GravelBags.it This test is not yet ap proved or cleared by the [place] FDA and  has been authorized for detection and/or diagnosis of SARS-CoV-2 by FDA under an Emergency Use Authorization (EUA). This EUA will remain  in effect (meaning this test can be used) for the duration of the COVID-19  declaration under Section 564(b)(1) of the Act, 21 U.S.C. section 360bbb-3(b)(1), unless the authorization is terminated or revoked sooner.    Influenza A by PCR NEGATIVE NEGATIVE Final   Influenza B by PCR NEGATIVE NEGATIVE Final    Comment: (NOTE) The Xpert Xpress SARS-CoV-2/FLU/RSV assay is intended as an aid in  the diagnosis of influenza from Nasopharyngeal swab specimens and  should not be used as a sole basis for treatment. Nasal washings and  aspirates are unacceptable for Xpert Xpress SARS-CoV-2/FLU/RSV  testing. Fact Sheet for Patients: PinkCheek.be Fact Sheet for Healthcare Providers: GravelBags.it This test is not yet approved or cleared by the [place] and  has been authorized for detection and/or diagnosis of SARS-CoV-2 by  FDA under an Emergency Use Authorization (EUA). This EUA will remain  in effect (meaning this test can be used) for the duration of the  Covid-19 declaration under Section 564(b)(1) of the Act, 21  U.S.C. section 360bbb-3(b)(1), unless the authorization is  terminated or revoked. Performed at [facility], [address] [place], [place] [address]     Radiology Reports DG Chest [place] 1 View  Result Date: 03/11/2019 CLINICAL DATA:  Shortness of breath EXAM: PORTABLE CHEST 1 VIEW COMPARISON:  02/01/2019 FINDINGS: Cardiomediastinal contours are stable. Lungs are clear aside from linear basilar opacity particularly over the right hemidiaphragm. Signs of right shoulder arthroplasty as before. No acute bone finding. IMPRESSION: Subtle right basilar opacity, likely atelectasis no signs of consolidation or evidence of pleural effusion. Electronically Signed   By: Zetta Bills M.D.   On: 03/11/2019 13:39   ECHOCARDIOGRAM COMPLETE  Result Date: 03/12/2019   ECHOCARDIOGRAM REPORT   Patient Name:   Derrick Moon Date of Exam: 03/12/2019 Medical Rec #:  [id number]      Height:       71.0 in  Accession #:    [id number]     Weight:       244.0 lb Date of Birth:  [birth year]     BSA:          2.29 m Patient Age:    48 years       BP:           136/66 mmHg Patient Gender: M              HR:           87 bpm. Exam Location:  [facility] Procedure: 2D Echo, Cardiac Doppler and Color Doppler Indications:    Dyspnea  History:        Patient has prior history of Echocardiogram examinations, most                 recent 02/06/2017. COPD and PAD, Arrythmias:Atrial Fibrillation;                 Risk Factors:Hypertension and Obesity.  Sonographer:    Dustin Flock RDCS Referring Phys: [id number] [place]  Sonographer Comments: Patient is morbidly obese. Image acquisition challenging due to COPD and Image acquisition challenging due to respiratory motion. IMPRESSIONS  1. Left ventricular ejection fraction, by visual estimation, is 60 to 65%. The left ventricle has normal function. There is borderline left ventricular hypertrophy.  2. Left ventricular diastolic parameters are indeterminate.  3. The left ventricle has no regional wall motion abnormalities.  4. Global right ventricle has normal systolic function.The right ventricular size is normal. No increase in right ventricular wall thickness.  5. Left atrial size was normal.  6. Right atrial size was normal.  7. Presence of pericardial fat pad.  8. Trivial pericardial effusion is present.  9. The mitral valve is normal in structure. Trivial mitral valve regurgitation. 10. The tricuspid valve is normal in structure. Tricuspid valve regurgitation is trivial. 11. The aortic valve is normal in structure. Aortic valve regurgitation is not visualized. Mild aortic valve sclerosis without stenosis. 12. The pulmonic valve was not well visualized. Pulmonic valve regurgitation is not visualized. 13. The atrial septum is grossly normal. FINDINGS  Left Ventricle: Left ventricular ejection fraction, by visual estimation, is 60 to 65%. The left ventricle has normal  function. The left ventricle has no regional wall  motion abnormalities. There is borderline left ventricular hypertrophy. Concentric left ventricular hypertrophy. Left ventricular diastolic parameters are indeterminate. Right Ventricle: The right ventricular size is normal. No increase in right ventricular wall thickness. Global RV systolic function is has normal systolic function. Left Atrium: Left atrial size was normal in size. Right Atrium: Right atrial size was normal in size Pericardium: Trivial pericardial effusion is present. Presence of pericardial fat pad. Mitral Valve: The mitral valve is normal in structure. Trivial mitral valve regurgitation. Tricuspid Valve: The tricuspid valve is normal in structure. Tricuspid valve regurgitation is trivial. Aortic Valve: The aortic valve is normal in structure. Aortic valve regurgitation is not visualized. Mild aortic valve sclerosis is present, with no evidence of aortic valve stenosis. Aortic valve peak gradient measures 9.4 mmHg. Pulmonic Valve: The pulmonic valve was not well visualized. Pulmonic valve regurgitation is not visualized. Pulmonic regurgitation is not visualized. Aorta: The aortic root is normal in size and structure. IAS/Shunts: The atrial septum is grossly normal.  LEFT VENTRICLE PLAX 2D LVIDd:         5.53 cm  Diastology LVIDs:         3.45 cm  LV e' lateral:   11.50 cm/s LV PW:         1.38 cm  LV E/e' lateral: 8.9 LV IVS:        1.21 cm  LV e' medial:    7.18 cm/s LVOT diam:     2.20 cm  LV E/e' medial:  14.2 LV SV:         100 ml LV SV Index:   41.89 LVOT Area:     3.80 cm  RIGHT VENTRICLE RV Basal diam:  3.14 cm RV S prime:     14.00 cm/s TAPSE (M-mode): 3.0 cm LEFT ATRIUM             Index       RIGHT ATRIUM           Index LA diam:        4.00 cm 1.74 cm/m  RA Area:     22.50 cm LA Vol (A2C):   63.0 ml 27.46 ml/m RA Volume:   70.70 ml  30.81 ml/m LA Vol (A4C):   41.0 ml 17.87 ml/m LA Biplane Vol: 54.8 ml 23.88 ml/m  AORTIC VALVE AV  Area (Vmax): 3.70 cm AV Vmax:        153.00 cm/s AV Peak Grad:   9.4 mmHg LVOT Vmax:      149.00 cm/s LVOT Vmean:     96.200 cm/s LVOT VTI:       0.293 m  AORTA Ao Root diam: 2.90 cm MITRAL VALVE MV Area (PHT): 4.39 cm              SHUNTS MV PHT:        50.17 msec            Systemic VTI:  0.29 m MV Decel Time: 173 msec              Systemic Diam: 2.20 cm MV E velocity: 102.00 cm/s 103 cm/s MV A velocity: 66.90 cm/s  70.3 cm/s MV E/A ratio:  1.52        1.5  Buford Dresser MD Electronically signed by Buford Dresser MD Signature Date/Time: 03/12/2019/3:06:18 PM    Final      CBC Recent Labs  Lab 03/11/19 1255 03/12/19 0610  WBC 9.9 11.4*  HGB 10.3* 9.4*  HCT 38.0* 35.2*  PLT 454* 438*  MCV 73.9* 73.2*  MCH 20.0* 19.5*  MCHC 27.1* 26.7*  RDW 21.7* 21.0*  LYMPHSABS 2.0  --   MONOABS 0.8  --   EOSABS 0.3  --   BASOSABS 0.1  --     Chemistries  Recent Labs  Lab 03/11/19 1255 03/12/19 0610  NA 141 139  K 3.8 3.9  CL 106 104  CO2 26 28  GLUCOSE 147* 176*  BUN 11 16  CREATININE 1.00 1.02  CALCIUM 9.5 9.1  AST 28  --   ALT 29  --   ALKPHOS 112  --   BILITOT 0.5  --    ------------------------------------------------------------------------------------------------------------------ Recent Labs    03/11/19 1459  TRIG 163*    No results found for: HGBA1C ------------------------------------------------------------------------------------------------------------------ No results for input(s): TSH, T4TOTAL, T3FREE, THYROIDAB in the last 72 hours.  Invalid input(s): FREET3 ------------------------------------------------------------------------------------------------------------------ Recent Labs    03/11/19 1255  FERRITIN 9*    Coagulation profile No results for input(s): INR, PROTIME in the last 168 hours.  Recent Labs    03/11/19 1255  DDIMER 0.93*    Cardiac Enzymes No results for input(s): CKMB, TROPONINI, MYOGLOBIN in the last 168 hours.   Invalid input(s): CK ------------------------------------------------------------------------------------------------------------------    Component Value Date/Time   BNP 52.0 03/11/2019 1255   Crystallynn Noorani M.D on 03/12/2019 at 6:13 PM  Go to [email or web address] - for contact info  [facility] - Office  [phone]

## 2019-03-12 NOTE — Progress Notes (Signed)
Applied water to O2 at request of patient.

## 2019-03-13 MED ORDER — HYDROXYZINE HCL 25 MG PO TABS
25.0000 mg | ORAL_TABLET | Freq: Once | ORAL | Status: AC
  Administered 2019-03-13: 25 mg via ORAL
  Filled 2019-03-13: qty 1

## 2019-03-13 MED ORDER — SENNOSIDES-DOCUSATE SODIUM 8.6-50 MG PO TABS
2.0000 | ORAL_TABLET | Freq: Two times a day (BID) | ORAL | Status: DC
  Administered 2019-03-13 – 2019-03-14 (×2): 2 via ORAL
  Filled 2019-03-13 (×3): qty 2

## 2019-03-13 NOTE — Progress Notes (Signed)
SATURATION QUALIFICATIONS: (This note is used to comply with regulatory documentation for home oxygen)  Patient Saturations on Room Air at Rest = N/A%  Patient Saturations on Room Air while Ambulating = N/A%  Patient Saturations on 2 Liters of oxygen while Ambulating = 91%  Please briefly explain why patient needs home oxygen: Patient started off at 97% on 2L of O2

## 2019-03-13 NOTE — Plan of Care (Signed)
  Problem: Education: Goal: Knowledge of General Education information will improve Description Including pain rating scale, medication(s)/side effects and non-pharmacologic comfort measures Outcome: Progressing   Problem: Health Behavior/Discharge Planning: Goal: Ability to manage health-related needs will improve Outcome: Progressing   

## 2019-03-13 NOTE — Progress Notes (Signed)
Patient Demographics:    Derrick Moon, is a 78 y.o. male, DOB - [birth year], [id number]  Admit date - 03/11/2019   Admitting Physician Vashti Hey, MD  Outpatient Primary MD for the patient is [name], MD  LOS - 2   Chief Complaint  Patient presents with  . Shortness of Breath        Subjective:    Derrick Moon today has no fevers, no emesis,  No chest pain,   --Cough and shortness of breath persist -No pleuritic symptoms -Ambulated in hallways with oxygen became very dyspneic, somewhat dizzy -Further ambulation aborted as O2 sats dropped from 97% to 91% with significant dizziness and dyspnea  Assessment  & Plan :    Principal Problem:   Acute on chronic respiratory failure (HCC) Active Problems:   Essential hypertension   PAF (paroxysmal atrial fibrillation) (HCC)   COPD exacerbation (HCC)   IDA (iron deficiency anemia)   Enlarged prostate   GERD (gastroesophageal reflux disease)  Brief Summary:- 78 y.o. male with Gold III COPD admitted on 03/11/2019 with acute COPD exacerbation  A/p 1) acute COPD exacerbation----cough and dyspnea persist --- Continue bronchodilators, IV Solu-Medrol and mucolytics -Oxygen supplementation -Further ambulation aborted as O2 sats dropped from 97% to 91% with significant dizziness and dyspnea  -Patient with very poor endurance may benefit from cardiopulmonary rehab post discharge  2)H/o PAFib--continue metoprolol 4.5 mg twice daily for rate control, echo with EF of 65%, continue Eliquis for stroke prophylaxis  3)HTN--continue amlodipine  4) acute on chronic hypoxic respiratory failure--PTA patient used oxygen as needed, was  not requiring oxygen continuously -Treat as above #1  Disposition/Need for in-Hospital Stay- patient unable to be discharged at this time due to --- pending improvement in respiratory status -Further ambulation  aborted as O2 sats dropped from 97% to 91% with significant dizziness and dyspnea  Code Status : Full  Family Communication:   NA (patient is alert, awake and coherent)  Disposition Plan  : TBD  Consults  :  na  DVT Prophylaxis  : Eliquis - SCDs   Lab Results  Component Value Date   PLT 438 (H) 03/12/2019    Inpatient Medications  Scheduled Meds: . amLODipine  10 mg Oral Daily  . apixaban  5 mg Oral BID  . famotidine  20 mg Oral QHS  . furosemide  40 mg Intravenous Daily  . guaiFENesin  600 mg Oral BID  . hydrOXYzine  25 mg Oral QHS  . ipratropium-albuterol  3 mL Nebulization Q6H  . methylPREDNISolone (SOLU-MEDROL) injection  40 mg Intravenous Q8H  . metoprolol tartrate  12.5 mg Oral BID  . montelukast  10 mg Oral QHS  . pantoprazole  40 mg Oral Daily  . senna-docusate  2 tablet Oral BID  . tamsulosin  0.4 mg Oral QPC supper   Continuous Infusions: PRN Meds:.acetaminophen, albuterol   Anti-infectives (From admission, onward)   None        Objective:   Vitals:   03/13/19 0449 03/13/19 1059 03/13/19 1430 03/13/19 1604  BP: 136/72  (!) 135/59   Pulse: 79  78   Resp: 20  18   Temp: 97.9 F (36.6 C)  98.7 F (37.1 C)   TempSrc: Oral  SpO2: 98% 96% 95% 94%  Weight:      Height:        Wt Readings from Last 3 Encounters:  03/11/19 110.7 kg  02/08/19 107 kg  05/28/18 90.4 kg     Intake/Output Summary (Last 24 hours) at 03/13/2019 1714 Last data filed at 03/13/2019 1532 Gross per 24 hour  Intake 240 ml  Output 1800 ml  Net -1560 ml   Physical Exam  Gen:- Awake Alert,  In no apparent distress  HEENT:- [place].AT, No sclera icterus Nose- [place] 2 L/min Neck-Supple Neck,No JVD,.  Lungs-diminished bilaterally with scattered wheezes, wheezing worsened with ambulation CV- S1, S2 normal, irregular Abd-  +ve B.Sounds, Abd Soft, No tenderness,    Extremity/Skin:- No  edema, pedal pulses present  Psych-affect is appropriate, oriented x3 Neuro-generalized  weakness, no new focal deficits, no tremors   Data Review:   Micro Results Recent Results (from the past 240 hour(s))  Blood Culture (routine x 2)     Status: None (Preliminary result)   Collection Time: 03/11/19  5:20 PM   Specimen: Left Antecubital; Blood  Result Value Ref Range Status   Specimen Description LEFT ANTECUBITAL  Final   Special Requests   Final    BOTTLES DRAWN AEROBIC AND ANAEROBIC Blood Culture adequate volume   Culture   Final    NO GROWTH 2 DAYS Performed at [facility], [address], [place], [place] [address]    Report Status PENDING  Incomplete  Blood Culture (routine x 2)     Status: None (Preliminary result)   Collection Time: 03/11/19  5:20 PM   Specimen: BLOOD LEFT HAND  Result Value Ref Range Status   Specimen Description BLOOD LEFT HAND  Final   Special Requests   Final    BOTTLES DRAWN AEROBIC AND ANAEROBIC Blood Culture adequate volume   Culture   Final    NO GROWTH 2 DAYS Performed at [facility], [address] [place], [place] [address]    Report Status PENDING  Incomplete  Respiratory Panel by RT PCR (Flu A&B, Covid) - Nasopharyngeal Swab     Status: None   Collection Time: 03/11/19  6:25 PM   Specimen: Nasopharyngeal Swab  Result Value Ref Range Status   SARS Coronavirus 2 by RT PCR NEGATIVE NEGATIVE Final    Comment: (NOTE) SARS-CoV-2 target nucleic acids are NOT DETECTED. The SARS-CoV-2 RNA is generally detectable in upper respiratoy specimens during the acute phase of infection. The lowest concentration of SARS-CoV-2 viral copies this assay can detect is 131 copies/mL. A negative result does not preclude SARS-Cov-2 infection and should not be used as the sole basis for treatment or other patient management decisions. A negative result may occur with  improper specimen collection/handling, submission of specimen other than nasopharyngeal swab, presence of viral mutation(s) within the areas targeted by this assay, and inadequate  number of viral copies (<131 copies/mL). A negative result must be combined with clinical observations, patient history, and epidemiological information. The expected result is Negative. Fact Sheet for Patients:  PinkCheek.be Fact Sheet for Healthcare Providers:  GravelBags.it This test is not yet ap proved or cleared by the [place] FDA and  has been authorized for detection and/or diagnosis of SARS-CoV-2 by FDA under an Emergency Use Authorization (EUA). This EUA will remain  in effect (meaning this test can be used) for the duration of the COVID-19 declaration under Section 564(b)(1) of the Act, 21 U.S.C. section 360bbb-3(b)(1), unless the authorization is terminated or  revoked sooner.    Influenza A by PCR NEGATIVE NEGATIVE Final   Influenza B by PCR NEGATIVE NEGATIVE Final    Comment: (NOTE) The Xpert Xpress SARS-CoV-2/FLU/RSV assay is intended as an aid in  the diagnosis of influenza from Nasopharyngeal swab specimens and  should not be used as a sole basis for treatment. Nasal washings and  aspirates are unacceptable for Xpert Xpress SARS-CoV-2/FLU/RSV  testing. Fact Sheet for Patients: PinkCheek.be Fact Sheet for Healthcare Providers: GravelBags.it This test is not yet approved or cleared by the [place] FDA and  has been authorized for detection and/or diagnosis of SARS-CoV-2 by  FDA under an Emergency Use Authorization (EUA). This EUA will remain  in effect (meaning this test can be used) for the duration of the  Covid-19 declaration under Section 564(b)(1) of the Act, 21  U.S.C. section 360bbb-3(b)(1), unless the authorization is  terminated or revoked. Performed at [facility], [address], [place], [place] [address]     Radiology Reports DG Chest [place] 1 View  Result Date: 03/11/2019 CLINICAL DATA:  Shortness of breath EXAM: PORTABLE  CHEST 1 VIEW COMPARISON:  02/01/2019 FINDINGS: Cardiomediastinal contours are stable. Lungs are clear aside from linear basilar opacity particularly over the right hemidiaphragm. Signs of right shoulder arthroplasty as before. No acute bone finding. IMPRESSION: Subtle right basilar opacity, likely atelectasis no signs of consolidation or evidence of pleural effusion. Electronically Signed   By: Zetta Bills M.D.   On: 03/11/2019 13:39   ECHOCARDIOGRAM COMPLETE  Result Date: 03/12/2019   ECHOCARDIOGRAM REPORT   Patient Name:   KWAKU DENNLER Date of Exam: 03/12/2019 Medical Rec #:  [id number]      Height:       71.0 in Accession #:    [id number]     Weight:       244.0 lb Date of Birth:  October 30, 1940     BSA:          2.29 m Patient Age:    44 years       BP:           136/66 mmHg Patient Gender: M              HR:           87 bpm. Exam Location:  [facility] Procedure: 2D Echo, Cardiac Doppler and Color Doppler Indications:    Dyspnea  History:        Patient has prior history of Echocardiogram examinations, most                 recent 02/06/2017. COPD and PAD, Arrythmias:Atrial Fibrillation;                 Risk Factors:Hypertension and Obesity.  Sonographer:    Dustin Flock RDCS Referring Phys: [id number] [place]  Sonographer Comments: Patient is morbidly obese. Image acquisition challenging due to COPD and Image acquisition challenging due to respiratory motion. IMPRESSIONS  1. Left ventricular ejection fraction, by visual estimation, is 60 to 65%. The left ventricle has normal function. There is borderline left ventricular hypertrophy.  2. Left ventricular diastolic parameters are indeterminate.  3. The left ventricle has no regional wall motion abnormalities.  4. Global right ventricle has normal systolic function.The right ventricular size is normal. No increase in right ventricular wall thickness.  5. Left atrial size was normal.  6. Right atrial size was normal.  7. Presence of  pericardial fat pad.  8. Trivial pericardial effusion  is present.  9. The mitral valve is normal in structure. Trivial mitral valve regurgitation. 10. The tricuspid valve is normal in structure. Tricuspid valve regurgitation is trivial. 11. The aortic valve is normal in structure. Aortic valve regurgitation is not visualized. Mild aortic valve sclerosis without stenosis. 12. The pulmonic valve was not well visualized. Pulmonic valve regurgitation is not visualized. 13. The atrial septum is grossly normal. FINDINGS  Left Ventricle: Left ventricular ejection fraction, by visual estimation, is 60 to 65%. The left ventricle has normal function. The left ventricle has no regional wall motion abnormalities. There is borderline left ventricular hypertrophy. Concentric left ventricular hypertrophy. Left ventricular diastolic parameters are indeterminate. Right Ventricle: The right ventricular size is normal. No increase in right ventricular wall thickness. Global RV systolic function is has normal systolic function. Left Atrium: Left atrial size was normal in size. Right Atrium: Right atrial size was normal in size Pericardium: Trivial pericardial effusion is present. Presence of pericardial fat pad. Mitral Valve: The mitral valve is normal in structure. Trivial mitral valve regurgitation. Tricuspid Valve: The tricuspid valve is normal in structure. Tricuspid valve regurgitation is trivial. Aortic Valve: The aortic valve is normal in structure. Aortic valve regurgitation is not visualized. Mild aortic valve sclerosis is present, with no evidence of aortic valve stenosis. Aortic valve peak gradient measures 9.4 mmHg. Pulmonic Valve: The pulmonic valve was not well visualized. Pulmonic valve regurgitation is not visualized. Pulmonic regurgitation is not visualized. Aorta: The aortic root is normal in size and structure. IAS/Shunts: The atrial septum is grossly normal.  LEFT VENTRICLE PLAX 2D LVIDd:         5.53 cm  Diastology  LVIDs:         3.45 cm  LV e' lateral:   11.50 cm/s LV PW:         1.38 cm  LV E/e' lateral: 8.9 LV IVS:        1.21 cm  LV e' medial:    7.18 cm/s LVOT diam:     2.20 cm  LV E/e' medial:  14.2 LV SV:         100 ml LV SV Index:   41.89 LVOT Area:     3.80 cm  RIGHT VENTRICLE RV Basal diam:  3.14 cm RV S prime:     14.00 cm/s TAPSE (M-mode): 3.0 cm LEFT ATRIUM             Index       RIGHT ATRIUM           Index LA diam:        4.00 cm 1.74 cm/m  RA Area:     22.50 cm LA Vol (A2C):   63.0 ml 27.46 ml/m RA Volume:   70.70 ml  30.81 ml/m LA Vol (A4C):   41.0 ml 17.87 ml/m LA Biplane Vol: 54.8 ml 23.88 ml/m  AORTIC VALVE AV Area (Vmax): 3.70 cm AV Vmax:        153.00 cm/s AV Peak Grad:   9.4 mmHg LVOT Vmax:      149.00 cm/s LVOT Vmean:     96.200 cm/s LVOT VTI:       0.293 m  AORTA Ao Root diam: 2.90 cm MITRAL VALVE MV Area (PHT): 4.39 cm              SHUNTS MV PHT:        50.17 msec            Systemic VTI:  0.29 m MV Decel Time: 173 msec              Systemic Diam: 2.20 cm MV E velocity: 102.00 cm/s 103 cm/s MV A velocity: 66.90 cm/s  70.3 cm/s MV E/A ratio:  1.52        1.5  Buford Dresser MD Electronically signed by Buford Dresser MD Signature Date/Time: 03/12/2019/3:06:18 PM    Final      CBC Recent Labs  Lab 03/11/19 1255 03/12/19 0610  WBC 9.9 11.4*  HGB 10.3* 9.4*  HCT 38.0* 35.2*  PLT 454* 438*  MCV 73.9* 73.2*  MCH 20.0* 19.5*  MCHC 27.1* 26.7*  RDW 21.7* 21.0*  LYMPHSABS 2.0  --   MONOABS 0.8  --   EOSABS 0.3  --   BASOSABS 0.1  --     Chemistries  Recent Labs  Lab 03/11/19 1255 03/12/19 0610  NA 141 139  K 3.8 3.9  CL 106 104  CO2 26 28  GLUCOSE 147* 176*  BUN 11 16  CREATININE 1.00 1.02  CALCIUM 9.5 9.1  AST 28  --   ALT 29  --   ALKPHOS 112  --   BILITOT 0.5  --    ------------------------------------------------------------------------------------------------------------------ Recent Labs    03/11/19 1459  TRIG 163*    No results found  for: HGBA1C ------------------------------------------------------------------------------------------------------------------ No results for input(s): TSH, T4TOTAL, T3FREE, THYROIDAB in the last 72 hours.  Invalid input(s): FREET3 ------------------------------------------------------------------------------------------------------------------ Recent Labs    03/11/19 1255  FERRITIN 9*    Coagulation profile No results for input(s): INR, PROTIME in the last 168 hours.  Recent Labs    03/11/19 1255  DDIMER 0.93*    Cardiac Enzymes No results for input(s): CKMB, TROPONINI, MYOGLOBIN in the last 168 hours.  Invalid input(s): CK ------------------------------------------------------------------------------------------------------------------    Component Value Date/Time   BNP 52.0 03/11/2019 1255   Azul Coffie M.D on 03/13/2019 at 5:14 PM  Go to [email or web address] - for contact info  [facility] - Office  [phone]

## 2019-03-14 MED ORDER — GUAIFENESIN ER 600 MG PO TB12: 600.0000 mg | ORAL_TABLET | Freq: Two times a day (BID) | ORAL | 0 refills | Status: AC

## 2019-03-14 MED ORDER — FUROSEMIDE 40 MG PO TABS: 40.0000 mg | ORAL_TABLET | Freq: Every day | ORAL | 11 refills | Status: DC

## 2019-03-14 MED ORDER — HYDROXYZINE HCL 25 MG PO TABS: 25.0000 mg | ORAL_TABLET | Freq: Every day | ORAL | 0 refills | Status: DC

## 2019-03-14 MED ORDER — IPRATROPIUM-ALBUTEROL 0.5-2.5 (3) MG/3ML IN SOLN: 3.0000 mL | RESPIRATORY_TRACT | 0 refills | Status: DC | PRN

## 2019-03-14 MED ORDER — AMLODIPINE BESYLATE 10 MG PO TABS: 10.0000 mg | ORAL_TABLET | Freq: Every day | ORAL | 5 refills | Status: DC

## 2019-03-14 MED ORDER — METOPROLOL TARTRATE 25 MG PO TABS: 12.5000 mg | ORAL_TABLET | Freq: Two times a day (BID) | ORAL | 3 refills | Status: DC

## 2019-03-14 MED ORDER — PREDNISONE 10 MG PO TABS: 20.0000 mg | ORAL_TABLET | ORAL | 0 refills | Status: DC

## 2019-03-14 NOTE — Discharge Summary (Signed)
Derrick Moon, is a 78 y.o. male  DOB 1940/04/05  MRN [id number].  Admission date:  03/11/2019  Admitting Physician  Vashti Hey, MD  Discharge Date:  03/14/2019   Primary MD  [name], MD  Recommendations for primary care physician for things to follow:   1)Very low-salt diet advised 2)Weigh yourself daily, call if you gain more than 3 pounds in 1 day or more than 5 pounds in 1 week as your diuretic medications may need to be adjusted 3)Limit your Fluid  intake to no more than 60 ounces (1.8 Liters) per day 4) you are taking apixaban for blood thinner so avoid Avoid ibuprofen/Advil/Aleve/Motrin/Goody Powders/Naproxen/BC powders/Meloxicam/Diclofenac/Indomethacin and other Nonsteroidal anti-inflammatory medications as these will make you more likely to bleed and can cause stomach ulcers, can also cause Kidney problems.  5)Take Prednisone as prescribed ---4 tablets (40mg ) daily for 5 days,  then 3 tablet (30 mg) for 3 days, then 2 Tablets (20 mg) daily for 3 days, then 1 Tablet (10 mg) daily for 3 days  then STOP -Always take with food 6) please follow-up with your pulmonologist Dr. Melvyn Novas over the next week to 2 weeks for recheck and reevaluation 7) you will benefit from outpatient cardio-pulmonary rehab to improve your endurance and exercise tolerance  Admission Diagnosis  SOB (shortness of breath) [R06.02] Acute on chronic respiratory failure with hypoxia (HCC) [J96.21]   Discharge Diagnosis  SOB (shortness of breath) [R06.02] Acute on chronic respiratory failure with hypoxia (HCC) [J96.21]   Principal Problem:   Acute on chronic respiratory failure (HCC) Active Problems:   Essential hypertension   PAF (paroxysmal atrial fibrillation) (HCC)   COPD exacerbation (HCC)   IDA (iron deficiency anemia)   Enlarged prostate   GERD (gastroesophageal reflux disease)      Past Medical  History:  Diagnosis Date  . AAA (abdominal aortic aneurysm) ([place])   . Anxiety    due to pain in leg  . Arthritis    Osteo  . Bell's palsy   . Cancer [facility])    Bladder cancer  . Cataracts, bilateral   . COPD (chronic obstructive pulmonary disease) (HCC)    Albuterol inhaler as needed  . Diverticulitis   . Enlarged prostate    takes Flomax daily  . GERD (gastroesophageal reflux disease)    was on Prilosec but has been off for yrs-no issues now with reflux  . Headache    migraines years ago  . History of bronchitis    > 24yrs ago  . History of colon polyps    benign  . History of dizziness    several yrs ago-was on Meclizine but has been off for several yrs-no problems since  . History of hiatal hernia   . Hypertension    takes Amlodipine and Lisinpril daily  . Joint pain   . Joint swelling   . Nocturia   . PAD (peripheral artery disease) (HCC)     bilateral superficial femoral artery occlusions   . Paroxysmal atrial fibrillation (HCC)   .  Pneumonia    several times with most recent within 5 yrs ago  . Restless leg syndrome   . Rupture of left quadriceps tendon   . Shortness of breath dyspnea    with exertion  . Sleep apnea    does not use cpap  . Urinary urgency     Past Surgical History:  Procedure Laterality Date  . BICEPS TENDON REPAIR     LEFT  . CARDIOVERSION N/A 02/06/2017   Procedure: CARDIOVERSION;  Surgeon: Jerline Pain, MD;  Location: [facility] ENDOSCOPY;  Service: Cardiovascular;  Laterality: N/A;  . CARPAL TUNNEL RELEASE Right 2014  . CHOLECYSTECTOMY  July 16,2010  . COLONOSCOPY    . CYSTOSCOPY    . ESOPHAGOGASTRODUODENOSCOPY    . FEMORAL-POPLITEAL BYPASS GRAFT Right 09/19/2015   Procedure: RIGHT COMMON FEMORAL-ABOVE KNEE POPLITEAL ARTERY    BYPASS GRAFT ;  Surgeon: Conrad Brady, MD;  Location: [place];  Service: Vascular;  Laterality: Right;  . FOOT SURGERY Right    x 5t  . HAND SURGERY Right   . HERNIA REPAIR Bilateral    inguinal  . PERIPHERAL  VASCULAR CATHETERIZATION N/A 08/02/2015   Procedure: Abdominal Aortogram w/Lower Extremity;  Surgeon: Conrad Mesilla, MD;  Location: [place] CV LAB;  Service: Cardiovascular;  Laterality: N/A;  . QUADRICEPS TENDON REPAIR Left 08/22/2016   Procedure: LEFT OPEN QUADRICEP TENDON REPAIR;  Surgeon: Netta Cedars, MD;  Location: [place];  Service: Orthopedics;  Laterality: Left;  . REVERSE SHOULDER ARTHROPLASTY Right 07/07/2014   Procedure: RIGHT REVERSE TOTAL SHOULDER ;  Surgeon: Netta Cedars, MD;  Location: [place];  Service: Orthopedics;  Laterality: Right;  . REVERSE TOTAL SHOULDER ARTHROPLASTY Right 07/07/2014   Dr Veverly Fells  . ROTATOR CUFF REPAIR     Bilateral   . TEE WITHOUT CARDIOVERSION N/A 02/06/2017   Procedure: TRANSESOPHAGEAL ECHOCARDIOGRAM (TEE);  Surgeon: Jerline Pain, MD;  Location: [facility] ENDOSCOPY;  Service: Cardiovascular;  Laterality: N/A;  . TONSILLECTOMY    . tumor removed from bladder    . VEIN HARVEST Right 09/19/2015   Procedure: WITH NON REVERSE RIGHT GREATER SAPHENOUS VEIN HARVEST;  Surgeon: Conrad [place], MD;  Location: [facility] OR;  Service: Vascular;  Laterality: Right;       HPI  from the history and physical done on the day of admission:    Derrick Moon is an 78 y.o. male with Gold III COPD presents with shortness of breath.  Patient states that he has had difficulty with shortness of breath and DOE over the past 3 to 4 months and has required least 3 courses of steroids since then.  Patient notes that he does well as long as he is on the steroids but as soon as he is tapered off he feels short of breath again.  Patient states that he had difficulty even walking across the room and was in fact short of breath at rest which is why he decided to come in today.  No fevers, no chills, no malaise, no change in appetite, no nausea, vomiting or diarrhea.  Patient notes this is how he feels when he comes off of his steroids.  Patient believes that he finished his last steroid course 2 weeks  ago.  Of note patient is concerned that he has gained 44 pounds over the past 2 to 3 enterocolitis.  He does admit to increased edema and increased abdominal girth.  Patient denies any chest pain.  He does have orthopnea but no PND but  this is not new.  ED Course:  The patient was noted to be tachypneic with increased work of breathing.  He was treated with treated with 4 puffs albuterol inhaler, Lasix 40 mg IV, mag sulfate and Solu-Medrol 125 milligrams.  Patient states he feels somewhat improved.   Hospital Course:    Brief Summary:- 78 y.o.malewithGold IIICOPD admitted on 03/11/2019 with acute COPD exacerbation  A/p 1) acute COPD exacerbation----cough and dyspnea persist -Much improved with bronchodilators, steroids and mucolytics Oxygen requirement back to baseline  -Patient with very poor endurance may benefit from cardiopulmonary rehab post discharge  2)H/o PAFib--continue metoprolol for rate control, echo with EF of 65%, continue Eliquis for stroke prophylaxis  3)HTN--continue amlodipine  4) acute on chronic hypoxic respiratory failure--PTA patient used oxygen as needed, was not requiring oxygen continuously -Oxygen requirement back to baseline   Disposition--home  Code Status : Full  Family Communication:    (patient is alert, awake and coherent)  Consults  :  na  DVT Prophylaxis  : Eliquis - SCDs   Discharge Condition: stable  Follow UP--- outpatient cardiopulmonary rehab advised  Diet and Activity recommendation:  As advised  Discharge Instructions    Discharge Instructions    AMB referral to pulmonary rehabilitation   Complete by: As directed    Please select a program: Pulmonary Rehabilitation (COPD Gold 2,3,4)   COPD Diagnosis: (See requirements below): COPD-Gold 3: Severe   30% </= FEV1 <50% predicted   Program Prescription:  O2 Administration by RT, EP, or RN if SpO2<88% Flutter Valve if indicated     After initial evaluation and  assessments completed: Virtual Based Care may be provided alone or in conjunction with Pulmonary Rehab/Respiratory Care services based on patient barriers.: Yes   Call MD for:  difficulty breathing, headache or visual disturbances   Complete by: As directed    Call MD for:  persistant dizziness or light-headedness   Complete by: As directed    Call MD for:  persistant nausea and vomiting   Complete by: As directed    Call MD for:  severe uncontrolled pain   Complete by: As directed    Call MD for:  temperature >100.4   Complete by: As directed    Diet - low sodium heart healthy   Complete by: As directed    Discharge instructions   Complete by: As directed    1)Very low-salt diet advised 2)Weigh yourself daily, call if you gain more than 3 pounds in 1 day or more than 5 pounds in 1 week as your diuretic medications may need to be adjusted 3)Limit your Fluid  intake to no more than 60 ounces (1.8 Liters) per day 4) you are taking apixaban for blood thinner so avoid Avoid ibuprofen/Advil/Aleve/Motrin/Goody Powders/Naproxen/BC powders/Meloxicam/Diclofenac/Indomethacin and other Nonsteroidal anti-inflammatory medications as these will make you more likely to bleed and can cause stomach ulcers, can also cause Kidney problems.  5)Take Prednisone as prescribed ---4 tablets (40mg ) daily for 5 days,  then 3 tablet (30 mg) for 3 days, then 2 Tablets (20 mg) daily for 3 days, then 1 Tablet (10 mg) daily for 3 days  then STOP -Always take with food 6) please follow-up with your pulmonologist Dr. Melvyn Novas over the next week to 2 weeks for recheck and reevaluation 7) you will benefit from outpatient cardio-pulmonary rehab to improve your endurance and exercise tolerance   Increase activity slowly   Complete by: As directed       Discharge Medications  Allergies as of 03/14/2019      Reactions   Adhesive [tape] Other (See Comments)   SKIN IS VERY THIN AND IT TEARS VERY EASILY!!! Please use an  alternative   Aspartame And Phenylalanine Nausea Only   Other Rash   Pt reports rash on back, buttocks, shoulders and stomach after arteriogram   Sulfonamide Derivatives Nausea And Vomiting      Medication List    STOP taking these medications   polyethylene glycol-electrolytes 420 g solution Commonly known as: TriLyte     TAKE these medications   acetaminophen 500 MG tablet Commonly known as: TYLENOL Take 1,000 mg by mouth every 6 (six) hours as needed (for pain or headaches).   albuterol 108 (90 Base) MCG/ACT inhaler Commonly known as: VENTOLIN HFA Inhale 1-2 puffs into the lungs every 6 (six) hours as needed for wheezing or shortness of breath. Reported on 10/03/2015   amLODipine 10 MG tablet Commonly known as: NORVASC Take 1 tablet (10 mg total) by mouth daily. What changed:   how much to take  how to take this  when to take this  additional instructions   apixaban 5 MG Tabs tablet Commonly known as: ELIQUIS Take 1 tablet (5 mg total) 2 (two) times daily by mouth.   budesonide-formoterol 160-4.5 MCG/ACT inhaler Commonly known as: Symbicort Inhale 2 puffs into the lungs 2 (two) times daily.   diphenhydrAMINE 25 MG tablet Commonly known as: BENADRYL Take 25 mg by mouth every 6 (six) hours as needed for itching or allergies.   docusate sodium 100 MG capsule Commonly known as: COLACE Take 1 capsule (100 mg total) by mouth 2 (two) times daily. What changed:   when to take this  reasons to take this   famotidine 20 MG tablet Commonly known as: Pepcid One at bedtime What changed:   how much to take  how to take this  when to take this  additional instructions   furosemide 40 MG tablet Commonly known as: Lasix Take 1 tablet (40 mg total) by mouth daily with breakfast.   guaiFENesin 600 MG 12 hr tablet Commonly known as: MUCINEX Take 1 tablet (600 mg total) by mouth 2 (two) times daily for 10 days.   hydrOXYzine 25 MG tablet Commonly known as:  ATARAX/VISTARIL Take 1 tablet (25 mg total) by mouth at bedtime.   ipratropium-albuterol 0.5-2.5 (3) MG/3ML Soln Commonly known as: DUONEB Take 3 mLs by nebulization every 4 (four) hours as needed.   metoprolol tartrate 25 MG tablet Commonly known as: LOPRESSOR Take 0.5 tablets (12.5 mg total) by mouth 2 (two) times daily. What changed:   how much to take  how to take this  when to take this  additional instructions   montelukast 10 MG tablet Commonly known as: SINGULAIR TAKE (1) TABLET BY MOUTH AT BEDTIME.   pantoprazole 40 MG tablet Commonly known as: PROTONIX TAKE 1 TABLET BY MOUTH 30 TO 60 MINUTES PRIOR TO THE FIRST MEAL OF THE DAY.   predniSONE 10 MG tablet Commonly known as: DELTASONE Take 2 tablets (20 mg total) by mouth See admin instructions. 4 tablets (40mg ) daily for 5 days,  then 3 tablet (30 mg) for 3 days, then 2 Tablets (20 mg) daily for 3 days, then 1 Tablet (10 mg) daily for 3 days  then STOP -Always take with food What changed:   how much to take  when to take this  additional instructions   tamsulosin 0.4 MG Caps capsule Commonly known as:  FLOMAX Take 0.4 mg by mouth daily after supper.      Major procedures and Radiology Reports - PLEASE review detailed and final reports for all details, in brief -   DG Chest Port 1 View  Result Date: 03/11/2019 CLINICAL DATA:  Shortness of breath EXAM: PORTABLE CHEST 1 VIEW COMPARISON:  02/01/2019 FINDINGS: Cardiomediastinal contours are stable. Lungs are clear aside from linear basilar opacity particularly over the right hemidiaphragm. Signs of right shoulder arthroplasty as before. No acute bone finding. IMPRESSION: Subtle right basilar opacity, likely atelectasis no signs of consolidation or evidence of pleural effusion. Electronically Signed   By: Zetta Bills M.D.   On: 03/11/2019 13:39   ECHOCARDIOGRAM COMPLETE  Result Date: 03/12/2019   ECHOCARDIOGRAM REPORT   Patient Name:   Derrick Moon Date of  Exam: 03/12/2019 Medical Rec #:  [id number]      Height:       71.0 in Accession #:    [id number]     Weight:       244.0 lb Date of Birth:  [birth year]     BSA:          2.29 m Patient Age:    89 years       BP:           136/66 mmHg Patient Gender: M              HR:           87 bpm. Exam Location:  [facility] Procedure: 2D Echo, Cardiac Doppler and Color Doppler Indications:    Dyspnea  History:        Patient has prior history of Echocardiogram examinations, most                 recent 02/06/2017. COPD and PAD, Arrythmias:Atrial Fibrillation;                 Risk Factors:Hypertension and Obesity.  Sonographer:    Dustin Flock RDCS Referring Phys: [id number] [place]  Sonographer Comments: Patient is morbidly obese. Image acquisition challenging due to COPD and Image acquisition challenging due to respiratory motion. IMPRESSIONS  1. Left ventricular ejection fraction, by visual estimation, is 60 to 65%. The left ventricle has normal function. There is borderline left ventricular hypertrophy.  2. Left ventricular diastolic parameters are indeterminate.  3. The left ventricle has no regional wall motion abnormalities.  4. Global right ventricle has normal systolic function.The right ventricular size is normal. No increase in right ventricular wall thickness.  5. Left atrial size was normal.  6. Right atrial size was normal.  7. Presence of pericardial fat pad.  8. Trivial pericardial effusion is present.  9. The mitral valve is normal in structure. Trivial mitral valve regurgitation. 10. The tricuspid valve is normal in structure. Tricuspid valve regurgitation is trivial. 11. The aortic valve is normal in structure. Aortic valve regurgitation is not visualized. Mild aortic valve sclerosis without stenosis. 12. The pulmonic valve was not well visualized. Pulmonic valve regurgitation is not visualized. 13. The atrial septum is grossly normal. FINDINGS  Left Ventricle: Left ventricular ejection  fraction, by visual estimation, is 60 to 65%. The left ventricle has normal function. The left ventricle has no regional wall motion abnormalities. There is borderline left ventricular hypertrophy. Concentric left ventricular hypertrophy. Left ventricular diastolic parameters are indeterminate. Right Ventricle: The right ventricular size is normal. No increase in right ventricular wall thickness. Global RV systolic function is has normal systolic  function. Left Atrium: Left atrial size was normal in size. Right Atrium: Right atrial size was normal in size Pericardium: Trivial pericardial effusion is present. Presence of pericardial fat pad. Mitral Valve: The mitral valve is normal in structure. Trivial mitral valve regurgitation. Tricuspid Valve: The tricuspid valve is normal in structure. Tricuspid valve regurgitation is trivial. Aortic Valve: The aortic valve is normal in structure. Aortic valve regurgitation is not visualized. Mild aortic valve sclerosis is present, with no evidence of aortic valve stenosis. Aortic valve peak gradient measures 9.4 mmHg. Pulmonic Valve: The pulmonic valve was not well visualized. Pulmonic valve regurgitation is not visualized. Pulmonic regurgitation is not visualized. Aorta: The aortic root is normal in size and structure. IAS/Shunts: The atrial septum is grossly normal.  LEFT VENTRICLE PLAX 2D LVIDd:         5.53 cm  Diastology LVIDs:         3.45 cm  LV e' lateral:   11.50 cm/s LV PW:         1.38 cm  LV E/e' lateral: 8.9 LV IVS:        1.21 cm  LV e' medial:    7.18 cm/s LVOT diam:     2.20 cm  LV E/e' medial:  14.2 LV SV:         100 ml LV SV Index:   41.89 LVOT Area:     3.80 cm  RIGHT VENTRICLE RV Basal diam:  3.14 cm RV S prime:     14.00 cm/s TAPSE (M-mode): 3.0 cm LEFT ATRIUM             Index       RIGHT ATRIUM           Index LA diam:        4.00 cm 1.74 cm/m  RA Area:     22.50 cm LA Vol (A2C):   63.0 ml 27.46 ml/m RA Volume:   70.70 ml  30.81 ml/m LA Vol (A4C):    41.0 ml 17.87 ml/m LA Biplane Vol: 54.8 ml 23.88 ml/m  AORTIC VALVE AV Area (Vmax): 3.70 cm AV Vmax:        153.00 cm/s AV Peak Grad:   9.4 mmHg LVOT Vmax:      149.00 cm/s LVOT Vmean:     96.200 cm/s LVOT VTI:       0.293 m  AORTA Ao Root diam: 2.90 cm MITRAL VALVE MV Area (PHT): 4.39 cm              SHUNTS MV PHT:        50.17 msec            Systemic VTI:  0.29 m MV Decel Time: 173 msec              Systemic Diam: 2.20 cm MV E velocity: 102.00 cm/s 103 cm/s MV A velocity: 66.90 cm/s  70.3 cm/s MV E/A ratio:  1.52        1.5  Buford Dresser MD Electronically signed by Buford Dresser MD Signature Date/Time: 03/12/2019/3:06:18 PM    Final    Micro Results   Recent Results (from the past 240 hour(s))  Blood Culture (routine x 2)     Status: None (Preliminary result)   Collection Time: 03/11/19  5:20 PM   Specimen: Left Antecubital; Blood  Result Value Ref Range Status   Specimen Description LEFT ANTECUBITAL  Final   Special Requests   Final    BOTTLES DRAWN  AEROBIC AND ANAEROBIC Blood Culture adequate volume   Culture   Final    NO GROWTH 2 DAYS Performed at [facility], [address], [place], [place] [address]    Report Status PENDING  Incomplete  Blood Culture (routine x 2)     Status: None (Preliminary result)   Collection Time: 03/11/19  5:20 PM   Specimen: BLOOD LEFT HAND  Result Value Ref Range Status   Specimen Description BLOOD LEFT HAND  Final   Special Requests   Final    BOTTLES DRAWN AEROBIC AND ANAEROBIC Blood Culture adequate volume   Culture   Final    NO GROWTH 2 DAYS Performed at [facility], [address] [place], [place] [address]    Report Status PENDING  Incomplete  Respiratory Panel by RT PCR (Flu A&B, Covid) - Nasopharyngeal Swab     Status: None   Collection Time: 03/11/19  6:25 PM   Specimen: Nasopharyngeal Swab  Result Value Ref Range Status   SARS Coronavirus 2 by RT PCR NEGATIVE NEGATIVE Final    Comment: (NOTE) SARS-CoV-2 target  nucleic acids are NOT DETECTED. The SARS-CoV-2 RNA is generally detectable in upper respiratoy specimens during the acute phase of infection. The lowest concentration of SARS-CoV-2 viral copies this assay can detect is 131 copies/mL. A negative result does not preclude SARS-Cov-2 infection and should not be used as the sole basis for treatment or other patient management decisions. A negative result may occur with  improper specimen collection/handling, submission of specimen other than nasopharyngeal swab, presence of viral mutation(s) within the areas targeted by this assay, and inadequate number of viral copies (<131 copies/mL). A negative result must be combined with clinical observations, patient history, and epidemiological information. The expected result is Negative. Fact Sheet for Patients:  PinkCheek.be Fact Sheet for Healthcare Providers:  GravelBags.it This test is not yet ap proved or cleared by the [place] FDA and  has been authorized for detection and/or diagnosis of SARS-CoV-2 by FDA under an Emergency Use Authorization (EUA). This EUA will remain  in effect (meaning this test can be used) for the duration of the COVID-19 declaration under Section 564(b)(1) of the Act, 21 U.S.C. section 360bbb-3(b)(1), unless the authorization is terminated or revoked sooner.    Influenza A by PCR NEGATIVE NEGATIVE Final   Influenza B by PCR NEGATIVE NEGATIVE Final    Comment: (NOTE) The Xpert Xpress SARS-CoV-2/FLU/RSV assay is intended as an aid in  the diagnosis of influenza from Nasopharyngeal swab specimens and  should not be used as a sole basis for treatment. Nasal washings and  aspirates are unacceptable for Xpert Xpress SARS-CoV-2/FLU/RSV  testing. Fact Sheet for Patients: PinkCheek.be Fact Sheet for Healthcare Providers: GravelBags.it This test is not  yet approved or cleared by the [place] FDA and  has been authorized for detection and/or diagnosis of SARS-CoV-2 by  FDA under an Emergency Use Authorization (EUA). This EUA will remain  in effect (meaning this test can be used) for the duration of the  Covid-19 declaration under Section 564(b)(1) of the Act, 21  U.S.C. section 360bbb-3(b)(1), unless the authorization is  terminated or revoked. Performed at [facility], [address] [place], [place] [address]        Today   Subjective    Derrick Moon today has no new concerns No fever  Or chills  No Nausea, Vomiting or Diarrhea   Patient has been seen and examined prior to discharge   Objective  Blood pressure 140/77, pulse 70, temperature (!) 97.5 F (36.4 C), temperature source Oral, resp. rate 19, height 5\' 11"  (1.803 m), weight 110.7 kg, SpO2 96 %.   Intake/Output Summary (Last 24 hours) at 03/14/2019 1521 Last data filed at 03/14/2019 1300 Gross per 24 hour  Intake 720 ml  Output 2550 ml  Net -1830 ml   Exam Gen:- Awake Alert,  In no apparent distress  HEENT:- [place].AT, No sclera icterus Nose- [place] 2 L/min Neck-Supple Neck,No JVD,.  Lungs-improved air movement without wheezing CV- S1, S2 normal, irregular Abd-  +ve B.Sounds, Abd Soft, No tenderness,    Extremity/Skin:- No  edema, pedal pulses present  Psych-affect is appropriate, oriented x3 Neuro-generalized weakness, no new focal deficits, no tremors   Data Review   CBC w Diff:  Lab Results  Component Value Date   WBC 11.4 (H) 03/12/2019   HGB 9.4 (L) 03/12/2019   HGB 9.8 (L) 03/03/2019   HCT 35.2 (L) 03/12/2019   HCT 35.9 (L) 03/03/2019   PLT 438 (H) 03/12/2019   PLT 493 (H) 03/03/2019   LYMPHOPCT 20 03/11/2019   MONOPCT 8 03/11/2019   EOSPCT 3 03/11/2019   BASOPCT 1 03/11/2019   CMP:  Lab Results  Component Value Date   NA 139 03/12/2019   K 3.9 03/12/2019   CL 104 03/12/2019   CO2 28 03/12/2019   BUN 16 03/12/2019   CREATININE  1.02 03/12/2019   PROT 7.6 03/11/2019   ALBUMIN 3.6 03/11/2019   BILITOT 0.5 03/11/2019   ALKPHOS 112 03/11/2019   AST 28 03/11/2019   ALT 29 03/11/2019   Total Discharge time is about 33 minutes  Roxan Hockey M.D on 03/14/2019 at 3:21 PM  Go to [email or web address] -  for contact info  [facility] - Office  [phone]

## 2019-03-14 NOTE — Care Management Important Message (Signed)
Important Message  Patient Details  Name: Derrick Moon MRN: [id number] Date of Birth: 12/07/1940   Medicare Important Message Given:  Yes  Asked [place], RN to deliver to patient's room   Tommy Medal 03/14/2019, 3:42 PM

## 2019-03-14 NOTE — Progress Notes (Signed)
Nsg Discharge Note  Admit Date:  03/11/2019 Discharge date: 03/14/2019   Derrick Moon to be D/C'd Home per MD order.  AVS completed.  Copy for chart, and copy for patient signed, and dated. Patient/caregiver able to verbalize understanding.  IV has been removed, tele discontinued.   Discharge Medication: Allergies as of 03/14/2019      Reactions   Adhesive [tape] Other (See Comments)   SKIN IS VERY THIN AND IT TEARS VERY EASILY!!! Please use an alternative   Aspartame And Phenylalanine Nausea Only   Other Rash   Pt reports rash on back, buttocks, shoulders and stomach after arteriogram   Sulfonamide Derivatives Nausea And Vomiting      Medication List    STOP taking these medications   polyethylene glycol-electrolytes 420 g solution Commonly known as: TriLyte     TAKE these medications   acetaminophen 500 MG tablet Commonly known as: TYLENOL Take 1,000 mg by mouth every 6 (six) hours as needed (for pain or headaches).   albuterol 108 (90 Base) MCG/ACT inhaler Commonly known as: VENTOLIN HFA Inhale 1-2 puffs into the lungs every 6 (six) hours as needed for wheezing or shortness of breath. Reported on 10/03/2015   amLODipine 10 MG tablet Commonly known as: NORVASC Take 1 tablet (10 mg total) by mouth daily. What changed:   how much to take  how to take this  when to take this  additional instructions   apixaban 5 MG Tabs tablet Commonly known as: ELIQUIS Take 1 tablet (5 mg total) 2 (two) times daily by mouth.   budesonide-formoterol 160-4.5 MCG/ACT inhaler Commonly known as: Symbicort Inhale 2 puffs into the lungs 2 (two) times daily.   diphenhydrAMINE 25 MG tablet Commonly known as: BENADRYL Take 25 mg by mouth every 6 (six) hours as needed for itching or allergies.   docusate sodium 100 MG capsule Commonly known as: COLACE Take 1 capsule (100 mg total) by mouth 2 (two) times daily. What changed:   when to take this  reasons to take this    famotidine 20 MG tablet Commonly known as: Pepcid One at bedtime What changed:   how much to take  how to take this  when to take this  additional instructions   furosemide 40 MG tablet Commonly known as: Lasix Take 1 tablet (40 mg total) by mouth daily with breakfast.   guaiFENesin 600 MG 12 hr tablet Commonly known as: MUCINEX Take 1 tablet (600 mg total) by mouth 2 (two) times daily for 10 days.   hydrOXYzine 25 MG tablet Commonly known as: ATARAX/VISTARIL Take 1 tablet (25 mg total) by mouth at bedtime.   ipratropium-albuterol 0.5-2.5 (3) MG/3ML Soln Commonly known as: DUONEB Take 3 mLs by nebulization every 4 (four) hours as needed.   metoprolol tartrate 25 MG tablet Commonly known as: LOPRESSOR Take 0.5 tablets (12.5 mg total) by mouth 2 (two) times daily. What changed:   how much to take  how to take this  when to take this  additional instructions   montelukast 10 MG tablet Commonly known as: SINGULAIR TAKE (1) TABLET BY MOUTH AT BEDTIME.   pantoprazole 40 MG tablet Commonly known as: PROTONIX TAKE 1 TABLET BY MOUTH 30 TO 60 MINUTES PRIOR TO THE FIRST MEAL OF THE DAY.   predniSONE 10 MG tablet Commonly known as: DELTASONE Take 2 tablets (20 mg total) by mouth See admin instructions. 4 tablets (40mg ) daily for 5 days,  then 3 tablet (30 mg) for 3  days, then 2 Tablets (20 mg) daily for 3 days, then 1 Tablet (10 mg) daily for 3 days  then STOP -Always take with food What changed:   how much to take  when to take this  additional instructions   tamsulosin 0.4 MG Caps capsule Commonly known as: FLOMAX Take 0.4 mg by mouth daily after supper.       Discharge Assessment: Vitals:   03/14/19 1323 03/14/19 1453  BP: 140/77   Pulse: 70   Resp: 19   Temp: (!) 97.5 F (36.4 C)   SpO2: 95% 96%   Skin clean, dry and intact without evidence of skin break down, no evidence of skin tears noted. IV catheter discontinued intact. Site without signs  and symptoms of complications - no redness or edema noted at insertion site, patient denies c/o pain - only slight tenderness at site.  Dressing with slight pressure applied.  D/c Instructions-Education: Discharge instructions given to patient/family with verbalized understanding. D/c education completed with patient/family including follow up instructions, medication list, d/c activities limitations if indicated, with other d/c instructions as indicated by MD - patient able to verbalize understanding, all questions fully answered. Patient instructed to return to ED, call 911, or call MD for any changes in condition.  Patient escorted via [place], and D/C home via private auto.   Zachery Conch, RN 03/14/2019 3:59 PM

## 2019-03-14 NOTE — Discharge Instructions (Signed)
1)Very low-salt diet advised 2)Weigh yourself daily, call if you gain more than 3 pounds in 1 day or more than 5 pounds in 1 week as your diuretic medications may need to be adjusted 3)Limit your Fluid  intake to no more than 60 ounces (1.8 Liters) per day 4) you are taking apixaban for blood thinner so avoid Avoid ibuprofen/Advil/Aleve/Motrin/Goody Powders/Naproxen/BC powders/Meloxicam/Diclofenac/Indomethacin and other Nonsteroidal anti-inflammatory medications as these will make you more likely to bleed and can cause stomach ulcers, can also cause Kidney problems.  5)Take Prednisone as prescribed ---4 tablets (40mg ) daily for 5 days,  then 3 tablet (30 mg) for 3 days, then 2 Tablets (20 mg) daily for 3 days, then 1 Tablet (10 mg) daily for 3 days  then STOP -Always take with food 6) please follow-up with your pulmonologist Dr. Melvyn Novas over the next week to 2 weeks for recheck and reevaluation 7) you will benefit from outpatient cardio-pulmonary rehab to improve your endurance and exercise tolerance

## 2019-03-16 LAB — CULTURE, BLOOD (ROUTINE X 2)
Culture: NO GROWTH
Culture: NO GROWTH
Special Requests: ADEQUATE
Special Requests: ADEQUATE

## 2019-03-21 ENCOUNTER — Other Ambulatory Visit: Payer: Self-pay

## 2019-03-21 ENCOUNTER — Other Ambulatory Visit: Payer: Self-pay | Admitting: Internal Medicine

## 2019-03-21 ENCOUNTER — Telehealth: Payer: Self-pay | Admitting: Internal Medicine

## 2019-03-21 MED ORDER — PEG 3350-KCL-NA BICARB-NACL 420 G PO SOLR: 4000.0000 mL | ORAL | 0 refills | Status: DC

## 2019-03-21 NOTE — Telephone Encounter (Signed)
Rx resent to pharmacy. Called and informed pt. Advised him to check with pharmacy as prep has been out of stock at some pharmacies. Advised him to call office back if prep is out of stock.

## 2019-03-21 NOTE — Telephone Encounter (Signed)
Pt is scheduled procedure with [facility] on 03/31/2019 and needs his prep called into [facility]

## 2019-03-24 DIAGNOSIS — J441 Chronic obstructive pulmonary disease with (acute) exacerbation: Secondary | ICD-10-CM | POA: Diagnosis not present

## 2019-03-24 DIAGNOSIS — Z79899 Other long term (current) drug therapy: Secondary | ICD-10-CM | POA: Diagnosis not present

## 2019-03-24 DIAGNOSIS — D508 Other iron deficiency anemias: Secondary | ICD-10-CM | POA: Diagnosis not present

## 2019-03-24 DIAGNOSIS — I10 Essential (primary) hypertension: Secondary | ICD-10-CM | POA: Diagnosis not present

## 2019-03-28 ENCOUNTER — Encounter (HOSPITAL_COMMUNITY): Payer: Self-pay | Admitting: Anesthesiology

## 2019-03-28 NOTE — Pre-Procedure Instructions (Signed)
After reviewing chart and patient's recent admission to the hospital, I sent inter office note to Dr Gala Romney to review chart. Spoke with Dr Hilaria Ota who feels we should reschedule procedure pending Pulmology workup.

## 2019-03-29 ENCOUNTER — Telehealth: Payer: Self-pay | Admitting: *Deleted

## 2019-03-29 ENCOUNTER — Other Ambulatory Visit (HOSPITAL_COMMUNITY): Payer: PPO

## 2019-03-29 ENCOUNTER — Encounter: Payer: Self-pay | Admitting: Internal Medicine

## 2019-03-29 ENCOUNTER — Other Ambulatory Visit (HOSPITAL_COMMUNITY)
Admission: RE | Admit: 2019-03-29 | Discharge: 2019-03-29 | Disposition: A | Discharge status: Home / Self Care | Payer: PPO | Source: Ambulatory Visit | Attending: Internal Medicine | Admitting: Internal Medicine

## 2019-03-29 ENCOUNTER — Telehealth: Payer: Self-pay | Admitting: Internal Medicine

## 2019-03-29 ENCOUNTER — Encounter (HOSPITAL_COMMUNITY)
Admission: RE | Admit: 2019-03-29 | Discharge: 2019-03-29 | Disposition: A | Discharge status: Home / Self Care | Payer: PPO | Source: Ambulatory Visit | Attending: Internal Medicine | Admitting: Internal Medicine

## 2019-03-29 ENCOUNTER — Other Ambulatory Visit: Payer: Self-pay

## 2019-03-29 DIAGNOSIS — J449 Chronic obstructive pulmonary disease, unspecified: Secondary | ICD-10-CM | POA: Diagnosis not present

## 2019-03-29 DIAGNOSIS — D509 Iron deficiency anemia, unspecified: Secondary | ICD-10-CM | POA: Diagnosis not present

## 2019-03-29 DIAGNOSIS — E538 Deficiency of other specified B group vitamins: Secondary | ICD-10-CM | POA: Diagnosis not present

## 2019-03-29 NOTE — Telephone Encounter (Signed)
Fine with me - be sure pt brings all active meds/ inhalers to [facility]

## 2019-03-29 NOTE — Telephone Encounter (Signed)
Dr. Shearon Stalls, are you okay with taking on this pt  Pt with known COPD- needing pulmonary clearance for endo coming up  Thanks!

## 2019-03-29 NOTE — Telephone Encounter (Signed)
Called patient. Aware procedure is cancelled for now. Advised will schedule for 6 week appt with [facility] and go from there. Called endo and [facility] making aware. Stacey please schedule appt for f/u.

## 2019-03-29 NOTE — Telephone Encounter (Signed)
-----   Message from Daneil Dolin, MD sent at 03/29/2019  8:12 AM EST ----- Since significant RECENT health issues since 11/20 OV here, cancel double scheduled for tomorrow.  OV here in 6 weeks with [facility] and we will go from there ----- Message ----- From: Encarnacion Chu, RN Sent: 03/28/2019   8:47 AM EST To: Daneil Dolin, MD  Dr Hilaria Ota thinks we need to wait Dr Marlene Lard evaluation before we do this case.

## 2019-03-29 NOTE — Telephone Encounter (Signed)
Sure no problem

## 2019-03-29 NOTE — Telephone Encounter (Signed)
Spoke with Derrick Moon at Dr Leverne Humbles office to verify the msg  Pt would like to switch physicians and needs appt for clearance for endo  Dr Melvyn Novas, please advise, thanks!

## 2019-03-29 NOTE — Telephone Encounter (Signed)
Left message for patient to call back  

## 2019-03-30 NOTE — Telephone Encounter (Signed)
Spoke with pt, scheduled appt with Dr. Shearon Stalls. Nothing further needed at this time- will close encounter.

## 2019-03-31 ENCOUNTER — Encounter (HOSPITAL_COMMUNITY): Admission: RE | Payer: Self-pay | Source: Home / Self Care

## 2019-03-31 ENCOUNTER — Ambulatory Visit (HOSPITAL_COMMUNITY): Admission: RE | Admit: 2019-03-31 | Payer: PPO | Source: Home / Self Care | Admitting: Internal Medicine

## 2019-03-31 SURGERY — COLONOSCOPY WITH PROPOFOL
Anesthesia: Monitor Anesthesia Care

## 2019-04-05 ENCOUNTER — Institutional Professional Consult (permissible substitution): Payer: PPO | Admitting: Internal Medicine

## 2019-04-08 ENCOUNTER — Institutional Professional Consult (permissible substitution): Payer: PPO | Admitting: Internal Medicine

## 2019-04-08 DIAGNOSIS — J9620 Acute and chronic respiratory failure, unspecified whether with hypoxia or hypercapnia: Secondary | ICD-10-CM | POA: Diagnosis not present

## 2019-04-08 DIAGNOSIS — J441 Chronic obstructive pulmonary disease with (acute) exacerbation: Secondary | ICD-10-CM | POA: Diagnosis not present

## 2019-04-08 DIAGNOSIS — J479 Bronchiectasis, uncomplicated: Secondary | ICD-10-CM | POA: Diagnosis not present

## 2019-04-08 DIAGNOSIS — R062 Wheezing: Secondary | ICD-10-CM | POA: Diagnosis not present

## 2019-04-19 ENCOUNTER — Other Ambulatory Visit: Payer: Self-pay | Admitting: Internal Medicine

## 2019-04-22 DIAGNOSIS — D509 Iron deficiency anemia, unspecified: Secondary | ICD-10-CM | POA: Diagnosis not present

## 2019-04-29 DIAGNOSIS — D509 Iron deficiency anemia, unspecified: Secondary | ICD-10-CM | POA: Diagnosis not present

## 2019-04-29 DIAGNOSIS — J449 Chronic obstructive pulmonary disease, unspecified: Secondary | ICD-10-CM | POA: Diagnosis not present

## 2019-05-10 ENCOUNTER — Ambulatory Visit (INDEPENDENT_AMBULATORY_CARE_PROVIDER_SITE_OTHER): Payer: PPO | Admitting: Internal Medicine

## 2019-05-10 ENCOUNTER — Other Ambulatory Visit: Payer: Self-pay

## 2019-05-10 ENCOUNTER — Encounter: Payer: Self-pay | Admitting: Internal Medicine

## 2019-05-10 DIAGNOSIS — J441 Chronic obstructive pulmonary disease with (acute) exacerbation: Secondary | ICD-10-CM

## 2019-05-10 MED ORDER — DOXYCYCLINE HYCLATE 100 MG PO TABS: 100.0000 mg | ORAL_TABLET | Freq: Two times a day (BID) | ORAL | 0 refills | Status: DC

## 2019-05-10 MED ORDER — PREDNISONE 10 MG PO TABS: 40.0000 mg | ORAL_TABLET | Freq: Every day | ORAL | 0 refills | Status: DC

## 2019-05-10 NOTE — Progress Notes (Signed)
Derrick Moon    [id number]    09-28-1940  Primary Care Physician:Fagan, Carloyn Manner, MD Date of Appointment: 05/10/2019 Established Patient Visit  I connected with  Derrick Moon on 05/10/19 by a video enabled telemedicine application and verified that I am speaking with the correct person using two identifiers.   I discussed the limitations of evaluation and management by telemedicine. The patient expressed understanding and agreed to proceed.  Chief complaint:   Chief Complaint  Patient presents with  . Follow-up    SOB, productive cough, chest tightness, ear pain, weight gain     HPI: Previous patient of Dr. Melvyn Novas. Re-establishing care with me for COPD.   Interval Updates:  131/78, 98 pulse, oxygen saturation 98% on 2.5 L - vitals reported per patient.   The patient reports his HR is not normally that fast - usually runs around 60.  He just took albuterol treatment about 20 minutes ago.  Has some chronic cough - no worse today than yesterday Wife - says he is wheezing.  Feels sinus congestion and chest congestion Had chills Friday night after his vaccine - but that is now improving.   He doesn't feel like he needs to go to the hospital.  He is having a bad day concerning his COPD - very SOB going to the bathroom which has been worsening over the last couple months.   Had a taper of prednisone on Jan 26th which he finished 5 days ago.  For his COPD ib Albuterol, symbicort, singulair.   On Eliquis for Atrial Fibrillation and on metoprolol for rate control  I have reviewed the patient's family social and past medical history and updated as appropriate.   Past Medical History:  Diagnosis Date  . AAA (abdominal aortic aneurysm) ([place])   . Anxiety    due to pain in leg  . Arthritis    Osteo  . Bell's palsy   . Cancer [facility])    Bladder cancer  . Cataracts, bilateral   . COPD (chronic obstructive pulmonary disease) ([facility])    Albuterol inhaler as needed  .  Diverticulitis   . Enlarged prostate    takes Flomax daily  . GERD (gastroesophageal reflux disease)    was on Prilosec but has been off for yrs-no issues now with reflux  . Headache    migraines years ago  . History of bronchitis    > 73yrs ago  . History of colon polyps    benign  . History of dizziness    several yrs ago-was on Meclizine but has been off for several yrs-no problems since  . History of hiatal hernia   . Hypertension    takes Amlodipine and Lisinpril daily  . Joint pain   . Joint swelling   . Nocturia   . PAD (peripheral artery disease) ([facility])     bilateral superficial femoral artery occlusions   . Paroxysmal atrial fibrillation ([facility])   . Pneumonia    several times with most recent within 5 yrs ago  . Restless leg syndrome   . Rupture of left quadriceps tendon   . Shortness of breath dyspnea    with exertion  . Sleep apnea    does not use cpap  . Urinary urgency     Past Surgical History:  Procedure Laterality Date  . BICEPS TENDON REPAIR     LEFT  . CARDIOVERSION N/A 02/06/2017   Procedure: CARDIOVERSION;  Surgeon: Jerline Pain,  MD;  Location: [place];  Service: Cardiovascular;  Laterality: N/A;  . CARPAL TUNNEL RELEASE Right 2014  . CHOLECYSTECTOMY  July 16,2010  . COLONOSCOPY    . CYSTOSCOPY    . ESOPHAGOGASTRODUODENOSCOPY    . FEMORAL-POPLITEAL BYPASS GRAFT Right 09/19/2015   Procedure: RIGHT COMMON FEMORAL-ABOVE KNEE POPLITEAL ARTERY    BYPASS GRAFT ;  Surgeon: Conrad George Mason, MD;  Location: [place];  Service: Vascular;  Laterality: Right;  . FOOT SURGERY Right    x 5t  . HAND SURGERY Right   . HERNIA REPAIR Bilateral    inguinal  . PERIPHERAL VASCULAR CATHETERIZATION N/A 08/02/2015   Procedure: Abdominal Aortogram w/Lower Extremity;  Surgeon: Conrad [place], MD;  Location: [place] [facility];  Service: Cardiovascular;  Laterality: N/A;  . QUADRICEPS TENDON REPAIR Left 08/22/2016   Procedure: LEFT OPEN QUADRICEP TENDON REPAIR;  Surgeon: Netta Cedars, MD;  Location: [place];  Service: Orthopedics;  Laterality: Left;  . REVERSE SHOULDER ARTHROPLASTY Right 07/07/2014   Procedure: RIGHT REVERSE TOTAL SHOULDER ;  Surgeon: Netta Cedars, MD;  Location: [place];  Service: Orthopedics;  Laterality: Right;  . REVERSE TOTAL SHOULDER ARTHROPLASTY Right 07/07/2014   Dr Veverly Fells  . ROTATOR CUFF REPAIR     Bilateral   . TEE WITHOUT CARDIOVERSION N/A 02/06/2017   Procedure: TRANSESOPHAGEAL ECHOCARDIOGRAM (TEE);  Surgeon: Jerline Pain, MD;  [facility] END[facility]COPY;  Service: Cardiovascular;  Laterality: N/A;  . TONSILLECTOMY    . tumor removed from bladder    . VEIN HARVEST Right 09/19/2015   Procedure: WITH NON REVERSE RIGHT GREATER SAPHENOUS VEIN HARVEST;  Surgeon: Conrad [place], MD;  Location: [facility];  Service: Vascular;  Laterality: Right;    Family History  Problem Relation Age of Onset  . Parkinson's disease Mother   . AAA (abdominal aortic aneurysm) Father   . Heart defect Sister        Congenital Heart Disease  . Heart disease Sister     Social History   Occupational History  . Not on file  Tobacco Use  . Smoking status: Former Smoker    Packs/day: 1.00    Years: 45.00    Pack years: 45.00    Types: Cigars    Quit date: 01/07/2019    Years since quitting: 0.3  . Smokeless tobacco: Never Used  Substance and Sexual Activity  . Alcohol use: Yes    Alcohol/week: 0.0 standard drinks    Comment: wine occasionally  . Drug use: Not Currently    Types: Marijuana  . Sexual activity: Yes    Review of systems: Constitutional: Chills CV: No chest pain, or palpitations. Resp: No hemoptysis.  Physical Exam: There were no vitals taken for this visit. - telephone visit  Grunting and audible wheezing ausculated through the phone. Able to complete full sentences.   Data Reviewed: Imaging: I have personally reviewed the chest xray Dec 2018 with chronic interstitial changes bibasilar - consistent with COPD. No pneumonia or pleural  effusion.   Echocardiogram Dec 2020 1. Left ventricular ejection fraction, by visual estimation, is 60 to  65%. The left ventricle has normal function. There is borderline left  ventricular hypertrophy.  2. Left ventricular diastolic parameters are indeterminate.  3. The left ventricle has no regional wall motion abnormalities.  4. Global right ventricle has normal systolic function.The right  ventricular size is normal. No increase in right ventricular wall  thickness.  5. Left atrial size was normal.  6. Right atrial size  was normal.  7. Presence of pericardial fat pad.  8. Trivial pericardial effusion is present.  9. The mitral valve is normal in structure. Trivial mitral valve  regurgitation.  10. The tricuspid valve is normal in structure. Tricuspid valve  regurgitation is trivial.  11. The aortic valve is normal in structure. Aortic valve regurgitation is  not visualized. Mild aortic valve sclerosis without stenosis.  12. The pulmonic valve was not well visualized. Pulmonic valve  regurgitation is not visualized.  13. The atrial septum is grossly normal.   PFTs: PFT Results Latest Ref Rng & Units 03/12/2018  FVC-Pre L 3.08  FVC-Predicted Pre % 72  FVC-Post L 3.66  FVC-Predicted Post % 85  Pre FEV1/FVC % % 59  Post FEV1/FCV % % 56  FEV1-Pre L 1.81  FEV1-Predicted Pre % 58  FEV1-Post L 2.05  DLCO UNC% % 42  DLCO COR %Predicted % 50  TLC L 7.95  TLC % Predicted % 109  RV % Predicted % 178   I have personally reviewed the patient's PFTs and my interpretation is moderately severe airflow limitation with borderline BD response consistent with COPD. FEV1 58% of predicted  Labs: A1AT within normal range  Immunization status: Immunization History  Administered Date(s) Administered  . Influenza, High Dose Seasonal PF 12/12/2015, 01/08/2017, 12/22/2017, 02/22/2019  . Moderna SARS-COVID-2 Vaccination 04/26/2019  . Tdap 08/15/2016    Assessment:  Moderately  Severe COPD FEV1 58% of predicted with acute exacerbation Chronic Hypoxemic Respiratory Failure on 2.5LNC  Plan/Recommendations: Mr. Koskie appears to be having an acute COPD exacerbation.  He has worsening wheezing and shortness of breath.  He recently finished a prednisone taper and did not receive antibiotics.  He also just had his Covid vaccine.  Would like to meet with him in person to further discuss his COPD disease management and progression.  I spoke with him and his wife over the phone, and they are agreeable to following up for his exacerbation in person in 2 weeks.  I spent 27  minutes on 05/10/2019 in care of this patient including face to face time and non-face to face time spent charting, review of outside records, and coordination of care.   Return to Care: Follow up with me in person in 2 weeks.  Lenice Llamas, MD Pulmonary and [place]

## 2019-05-16 ENCOUNTER — Ambulatory Visit: Payer: PPO | Admitting: Gastroenterology

## 2019-05-18 ENCOUNTER — Other Ambulatory Visit: Payer: Self-pay

## 2019-05-18 ENCOUNTER — Encounter: Payer: Self-pay | Admitting: Internal Medicine

## 2019-05-18 ENCOUNTER — Ambulatory Visit (INDEPENDENT_AMBULATORY_CARE_PROVIDER_SITE_OTHER): Payer: PPO | Admitting: Internal Medicine

## 2019-05-18 VITALS — BP 128/74 | HR 82 | Temp 97.4°F | Ht 71.0 in | Wt 242.6 lb

## 2019-05-18 DIAGNOSIS — R252 Cramp and spasm: Secondary | ICD-10-CM

## 2019-05-18 DIAGNOSIS — J449 Chronic obstructive pulmonary disease, unspecified: Secondary | ICD-10-CM

## 2019-05-18 NOTE — Progress Notes (Signed)
Derrick Moon    [id number]    Jun 15, 1940  Primary Care Physician:Fagan, Carloyn Manner, MD Date of Appointment: 05/18/2019 Established Patient Visit  Chief complaint:   Chief Complaint  Patient presents with  . Follow-up    F/U for COPD. Increased fluid.      HPI: Previous patient of Dr. Melvyn Novas. Re-establishing care with me for COPD. We had a telephone visit a couple weeks ago. Now having in person visit due to follow up for exacerbation.   Interval Updates: Just finished 5 days of prednisone and antibiotics Has gained 45 lbs in the last 3 months. Has been steroid dependent the last 3 months.   For his COPD is on Albuterol, symbicort, singulair. Albuterol seems to help him the most as far as his inhalers are concerned.  Was on Eliquis for Atrial Fibrillation and on metoprolol for rate control.  Was supposed to have EGD colon for anemia but this was cancelled due to his respiratory status.    Body Mass Index, Obstruction, Dyspnea, Exercise Capacity (BODE) Index  FEV1 (% predicted)  ? 65: 0  50-64: 1 36-49: 2 ? 35: 3 6-Minute Walk Test (meters)  ? 350: 0 250-349:1  150-249: 2 ? 149: 3 MMRC Dyspnea Scale 0 1 2 3 4   Body Mass Index  >21: 0 ? 21: 1   4 Year Survival Rates  0-2 points - 80%   3-4 points - 67%   5-6 points - 57%   7-10 points - 18%  References  . Celli BR, Cote CG, Marin JM, et. al. The body-mass index, airflow obstruction, dyspnea and exercise capacity index in chronic obstructive pulmonary disease. N Engl J Med. 2004 Mar 4;350(10):1005-12. Lianne Bushy DA, Wells CK. Evaluation of clinical methods for rating dyspnea. Chest. 1988 Mar;93(3):580-6.   I have reviewed the patient's family social and past medical history and updated as appropriate.   Past Medical History:  Diagnosis Date  . AAA (abdominal aortic aneurysm) ([place])   . Anxiety    due to pain in leg  . Arthritis    Osteo  . Bell's palsy   . Cancer [facility])    Bladder cancer  .  Cataracts, bilateral   . COPD (chronic obstructive pulmonary disease) ([facility])    Albuterol inhaler as needed  . Diverticulitis   . Enlarged prostate    takes Flomax daily  . GERD (gastroesophageal reflux disease)    was on Prilosec but has been off for yrs-no issues now with reflux  . Headache    migraines years ago  . History of bronchitis    > 91yrs ago  . History of colon polyps    benign  . History of dizziness    several yrs ago-was on Meclizine but has been off for several yrs-no problems since  . History of hiatal hernia   . Hypertension    takes Amlodipine and Lisinpril daily  . Joint pain   . Joint swelling   . Nocturia   . PAD (peripheral artery disease) (H[facility])     bilateral superficial femoral artery occlusions   . Paroxysmal atrial fibrillation ([facility])   . Pneumonia    several times with most recent within 5 yrs ago  . Restless leg syndrome   . Rupture of left quadriceps tendon   . Shortness of breath dyspnea    with exertion  . Sleep apnea    does not use cpap  . Urinary urgency  Past Surgical History:  Procedure Laterality Date  . BICEPS TENDON REPAIR     LEFT  . CARDIOVERSION N/A 02/06/2017   Procedure: CARDIOVERSION;  Surgeon: Jerline Pain, MD;  Location: [facility] ENDOSCOPY;  Service: Cardiovascular;  Laterality: N/A;  . CARPAL TUNNEL RELEASE Right 2014  . CHOLECYSTECTOMY  July 16,2010  . COLONOSCOPY    . CYSTOSCOPY    . ESOPHAGOGASTRODUODENOSCOPY    . FEMORAL-POPLITEAL BYPASS GRAFT Right 09/19/2015   Procedure: RIGHT COMMON FEMORAL-ABOVE KNEE POPLITEAL ARTERY    BYPASS GRAFT ;  Surgeon: Conrad Yates [place], MD;  Location: [place];  Service: Vascular;  Laterality: Right;  . FOOT SURGERY Right    x 5t  . HAND SURGERY Right   . HERNIA REPAIR Bilateral    inguinal  . PERIPHERAL VASCULAR CATHETERIZATION N/A 08/02/2015   Procedure: Abdominal Aortogram w/Lower Extremity;  Surgeon: Conrad Lenora, MD;  Location: [place] CV LAB;  Service: Cardiovascular;  Laterality:  N/A;  . QUADRICEPS TENDON REPAIR Left 08/22/2016   Procedure: LEFT OPEN QUADRICEP TENDON REPAIR;  Surgeon: Netta Cedars, MD;  Location: [place];  Service: Orthopedics;  Laterality: Left;  . REVERSE SHOULDER ARTHROPLASTY Right 07/07/2014   Procedure: RIGHT REVERSE TOTAL SHOULDER ;  Surgeon: Netta Cedars, MD;  Location: [place];  Service: Orthopedics;  Laterality: Right;  . REVERSE TOTAL SHOULDER ARTHROPLASTY Right 07/07/2014   Dr Veverly Fells  . ROTATOR CUFF REPAIR     Bilateral   . TEE WITHOUT CARDIOVERSION N/A 02/06/2017   Procedure: TRANSESOPHAGEAL ECHOCARDIOGRAM (TEE);  Surgeon: Jerline Pain, MD;  Location: [facility] ENDOSCOPY;  Service: Cardiovascular;  Laterality: N/A;  . TONSILLECTOMY    . tumor removed from bladder    . VEIN HARVEST Right 09/19/2015   Procedure: WITH NON REVERSE RIGHT GREATER SAPHENOUS VEIN HARVEST;  Surgeon: Conrad [place], MD;  Location: [place];  Service: Vascular;  Laterality: Right;    Family History  Problem Relation Age of Onset  . Parkinson's disease Mother   . AAA (abdominal aortic aneurysm) Father   . Heart defect Sister        Congenital Heart Disease  . Heart disease Sister     Social History   Occupational History  . Not on file  Tobacco Use  . Smoking status: Former Smoker    Packs/day: 1.00    Years: 45.00    Pack years: 45.00    Types: Cigars    Quit date: 01/07/2019    Years since quitting: 0.3  . Smokeless tobacco: Never Used  Substance and Sexual Activity  . Alcohol use: Yes    Alcohol/week: 0.0 standard drinks    Comment: wine occasionally  . Drug use: Not Currently    Types: Marijuana  . Sexual activity: Yes    Review of systems: Constitutional: Chills CV: No chest pain, or palpitations. Resp: No hemoptysis.  Physical Exam: Blood pressure 128/74, pulse 82, temperature (!) 97.4 F (36.3 C), temperature source Temporal, height 5\' 11"  (1.803 m), weight 242 lb 9.6 oz (110 kg), SpO2 97 %.   Gen: cushingoid, in wheel chair Resp: diminished  bilaterally CV: diminished, RRR Ext: Upper and lower pitting edema, thin skin   Data Reviewed: Imaging: I have personally reviewed the chest xray Dec 2018 with chronic interstitial changes bibasilar - consistent with COPD. No pneumonia or pleural effusion.   Echocardiogram Dec 2020 1. Left ventricular ejection fraction, by visual estimation, is 60 to  65%. The left ventricle has normal function. There is borderline left  ventricular hypertrophy.  2. Left ventricular diastolic parameters are indeterminate.  3. The left ventricle has no regional wall motion abnormalities.  4. Global right ventricle has normal systolic function.The right  ventricular size is normal. No increase in right ventricular wall  thickness.  5. Left atrial size was normal.  6. Right atrial size was normal.  7. Presence of pericardial fat pad.  8. Trivial pericardial effusion is present.  9. The mitral valve is normal in structure. Trivial mitral valve  regurgitation.  10. The tricuspid valve is normal in structure. Tricuspid valve  regurgitation is trivial.  11. The aortic valve is normal in structure. Aortic valve regurgitation is  not visualized. Mild aortic valve sclerosis without stenosis.  12. The pulmonic valve was not well visualized. Pulmonic valve  regurgitation is not visualized.  13. The atrial septum is grossly normal.   PFTs:  PFT Results Latest Ref Rng & Units 03/12/2018  FVC-Pre L 3.08  FVC-Predicted Pre % 72  FVC-Post L 3.66  FVC-Predicted Post % 85  Pre FEV1/FVC % % 59  Post FEV1/FCV % % 56  FEV1-Pre L 1.81  FEV1-Predicted Pre % 58  FEV1-Post L 2.05  DLCO UNC% % 42  DLCO COR %Predicted % 50  TLC L 7.95  TLC % Predicted % 109  RV % Predicted % 178   I have personally reviewed the patient's PFTs and my interpretation is moderately severe airflow limitation with borderline BD response consistent with COPD. FEV1 58% of predicted  Labs: A1AT within normal  range  Immunization status: Immunization History  Administered Date(s) Administered  . Influenza, High Dose Seasonal PF 12/12/2015, 01/08/2017, 12/22/2017, 02/22/2019  . Moderna SARS-COVID-2 Vaccination 04/26/2019  . Tdap 08/15/2016    Assessment:  Moderately Severe COPD FEV1 58% of predicted with acute exacerbation Chronic Hypoxemic Respiratory Failure on 2.5LNC  Plan/Recommendations: Mr. Ake is very symptomatic and is Gold stage D with regards to his symptoms.  Although his FEV1 is only 58% of predicted from his 2019 PFTs, given his near prednisone dependence I do think that he is approaching a more end-stage phenotype.  Discussed COPD disease management and progression a great length today.  He is having some leg cramping so we will get a BMP today to ensure his potassium and magnesium are okay.  He is interested in pulmonary rehabilitation so we will get that set up for him at [facility] also wants antigen and/or BiPAP.  I do think that he would benefit significantly from noninvasive ventilation for his dyspnea.  I would hold off on any elective procedures until we are better able to control his respiratory symptoms and at least get him off a little less prednisone.  I calculate his BODE score today and he has an estimated 4-year survival of 18%.  I spent 35 minutes on 05/18/2019 in care of this patient including face to face time and non-face to face time spent charting, review of outside records, and coordination of care.   Return to Care: Return in about 1 month (around 06/15/2019).  This can be a telephone visit.   Lenice Llamas, MD Pulmonary and [place]

## 2019-05-18 NOTE — Patient Instructions (Addendum)
The patient should have follow up scheduled with myself in 1 months. Can be a telephone visit.

## 2019-05-19 ENCOUNTER — Telehealth: Payer: Self-pay | Admitting: Internal Medicine

## 2019-05-19 LAB — BASIC METABOLIC PANEL
BUN: 15 mg/dL (ref 6–23)
CO2: 29 mEq/L (ref 19–32)
Calcium: 9.3 mg/dL (ref 8.4–10.5)
Chloride: 104 mEq/L (ref 96–112)
Creatinine, Ser: 0.98 mg/dL (ref 0.40–1.50)
GFR: 73.73 mL/min (ref >60.00)
Glucose, Bld: 152 mg/dL — ABNORMAL HIGH (ref 70–99)
Potassium: 4.1 mEq/L (ref 3.5–5.1)
Sodium: 139 mEq/L (ref 135–145)

## 2019-05-19 NOTE — Telephone Encounter (Signed)
Spoke with patient regarding lab result. They verbalized understanding. No further questions.  Nothing further needed at this time.

## 2019-05-19 NOTE — Telephone Encounter (Signed)
Potassium and magnesium are normal.

## 2019-05-20 DIAGNOSIS — D509 Iron deficiency anemia, unspecified: Secondary | ICD-10-CM | POA: Diagnosis not present

## 2019-05-26 ENCOUNTER — Telehealth: Payer: Self-pay | Admitting: "Endocrinology

## 2019-05-26 ENCOUNTER — Other Ambulatory Visit: Payer: Self-pay

## 2019-05-26 DIAGNOSIS — E0590 Thyrotoxicosis, unspecified without thyrotoxic crisis or storm: Secondary | ICD-10-CM

## 2019-05-26 NOTE — Telephone Encounter (Signed)
Lab orders updated and printed. [place] made aware

## 2019-05-26 NOTE — Telephone Encounter (Signed)
Can you update lab order and let me know so I can mail it thanks

## 2019-05-27 DIAGNOSIS — J9611 Chronic respiratory failure with hypoxia: Secondary | ICD-10-CM | POA: Diagnosis not present

## 2019-05-27 DIAGNOSIS — D509 Iron deficiency anemia, unspecified: Secondary | ICD-10-CM | POA: Diagnosis not present

## 2019-05-27 DIAGNOSIS — I10 Essential (primary) hypertension: Secondary | ICD-10-CM | POA: Diagnosis not present

## 2019-05-27 DIAGNOSIS — I480 Paroxysmal atrial fibrillation: Secondary | ICD-10-CM | POA: Diagnosis not present

## 2019-05-30 ENCOUNTER — Ambulatory Visit: Payer: PPO | Admitting: "Endocrinology

## 2019-06-10 ENCOUNTER — Telehealth: Payer: Self-pay | Admitting: Cardiovascular Disease

## 2019-06-10 ENCOUNTER — Telehealth: Payer: Self-pay | Admitting: Internal Medicine

## 2019-06-10 NOTE — Telephone Encounter (Signed)
Called spoke with patient's spouse to discuss Dr Mauricio Po recommendations as stated below.  Spouse was argumentative because she did not understand why this office could not take care of patient's needs.  Explained to spouse that this is a pulmonary office and we treat symptoms related to such and reiterated to her to contact cardiology and PCP for patient's symptoms.  Spouse voiced her understanding Nothing further needed; will sign off

## 2019-06-10 NOTE — Telephone Encounter (Signed)
Spoke with pt's wife (dpr on file), c/o increased sob, chest tightness, headache, b/l LE edema extending up to hips, redness of b/l LE, sinus congestion, fatigue.  Wife states that s/s have been present and gradually worsening Xseveral weeks.       Denies fever, loss of taste/smell, sharp chest pains, low O2 sats, localized leg pain.    Has been taking OTC cold and flu relief, as well as  Maintenance symbicort, albuterol prn, duoneb prn.  Notes that pt takes 40mg  lasix daily (prescribed by PCP).  Wife is requesting additional recs including a pain med to help with leg pain.    [facility] in [place].    Wife also states that pt sees cardiologist.  I advised wife to also contact cardiology office to make aware of   Dr. Shearon Stalls please advise on recs.  Thanks!

## 2019-06-10 NOTE — Telephone Encounter (Signed)
Yes agree that he needs to follow up with cardiologist about the lower extremity edema. He may need an increase in his lasix. Should talk to PCP about pain meds for his legs.

## 2019-06-10 NOTE — Telephone Encounter (Signed)
Per phone call from pt's wife--pt is having swelling in his legs and it's painful, states it looks like they're about to bust.  [phone]

## 2019-06-13 NOTE — Telephone Encounter (Signed)
Pt to be seen in [place] office on tomorrow.

## 2019-06-14 ENCOUNTER — Ambulatory Visit (INDEPENDENT_AMBULATORY_CARE_PROVIDER_SITE_OTHER): Payer: PPO | Admitting: Cardiovascular Disease

## 2019-06-14 ENCOUNTER — Encounter: Payer: Self-pay | Admitting: Cardiovascular Disease

## 2019-06-14 ENCOUNTER — Other Ambulatory Visit: Payer: Self-pay

## 2019-06-14 ENCOUNTER — Inpatient Hospital Stay (HOSPITAL_COMMUNITY)
Admission: EM | Admit: 2019-06-14 | Discharge: 2019-06-19 | DRG: 291 | Disposition: A | Discharge status: Home Health | Payer: PPO | Attending: Internal Medicine | Admitting: Internal Medicine

## 2019-06-14 ENCOUNTER — Emergency Department (HOSPITAL_COMMUNITY): Payer: PPO

## 2019-06-14 ENCOUNTER — Encounter (HOSPITAL_COMMUNITY): Payer: Self-pay | Admitting: Emergency Medicine

## 2019-06-14 VITALS — BP 118/72 | HR 77 | Ht 71.0 in | Wt 255.0 lb

## 2019-06-14 DIAGNOSIS — I10 Essential (primary) hypertension: Secondary | ICD-10-CM | POA: Diagnosis not present

## 2019-06-14 DIAGNOSIS — G2581 Restless legs syndrome: Secondary | ICD-10-CM | POA: Diagnosis not present

## 2019-06-14 DIAGNOSIS — Z8249 Family history of ischemic heart disease and other diseases of the circulatory system: Secondary | ICD-10-CM | POA: Diagnosis not present

## 2019-06-14 DIAGNOSIS — I2510 Atherosclerotic heart disease of native coronary artery without angina pectoris: Secondary | ICD-10-CM | POA: Diagnosis not present

## 2019-06-14 DIAGNOSIS — Z96611 Presence of right artificial shoulder joint: Secondary | ICD-10-CM | POA: Diagnosis present

## 2019-06-14 DIAGNOSIS — I739 Peripheral vascular disease, unspecified: Secondary | ICD-10-CM

## 2019-06-14 DIAGNOSIS — Z7951 Long term (current) use of inhaled steroids: Secondary | ICD-10-CM | POA: Diagnosis not present

## 2019-06-14 DIAGNOSIS — R3915 Urgency of urination: Secondary | ICD-10-CM | POA: Diagnosis not present

## 2019-06-14 DIAGNOSIS — R600 Localized edema: Secondary | ICD-10-CM

## 2019-06-14 DIAGNOSIS — C679 Malignant neoplasm of bladder, unspecified: Secondary | ICD-10-CM | POA: Diagnosis not present

## 2019-06-14 DIAGNOSIS — J9601 Acute respiratory failure with hypoxia: Secondary | ICD-10-CM | POA: Diagnosis present

## 2019-06-14 DIAGNOSIS — Z79899 Other long term (current) drug therapy: Secondary | ICD-10-CM

## 2019-06-14 DIAGNOSIS — D72829 Elevated white blood cell count, unspecified: Secondary | ICD-10-CM | POA: Diagnosis not present

## 2019-06-14 DIAGNOSIS — J449 Chronic obstructive pulmonary disease, unspecified: Secondary | ICD-10-CM | POA: Diagnosis present

## 2019-06-14 DIAGNOSIS — G510 Bell's palsy: Secondary | ICD-10-CM | POA: Diagnosis not present

## 2019-06-14 DIAGNOSIS — I2583 Coronary atherosclerosis due to lipid rich plaque: Secondary | ICD-10-CM

## 2019-06-14 DIAGNOSIS — J441 Chronic obstructive pulmonary disease with (acute) exacerbation: Secondary | ICD-10-CM

## 2019-06-14 DIAGNOSIS — R0602 Shortness of breath: Secondary | ICD-10-CM

## 2019-06-14 DIAGNOSIS — G4730 Sleep apnea, unspecified: Secondary | ICD-10-CM | POA: Diagnosis not present

## 2019-06-14 DIAGNOSIS — Z20822 Contact with and (suspected) exposure to covid-19: Secondary | ICD-10-CM | POA: Diagnosis present

## 2019-06-14 DIAGNOSIS — R300 Dysuria: Secondary | ICD-10-CM | POA: Diagnosis not present

## 2019-06-14 DIAGNOSIS — K219 Gastro-esophageal reflux disease without esophagitis: Secondary | ICD-10-CM | POA: Diagnosis present

## 2019-06-14 DIAGNOSIS — G4700 Insomnia, unspecified: Secondary | ICD-10-CM | POA: Diagnosis not present

## 2019-06-14 DIAGNOSIS — Z87891 Personal history of nicotine dependence: Secondary | ICD-10-CM

## 2019-06-14 DIAGNOSIS — T380X5A Adverse effect of glucocorticoids and synthetic analogues, initial encounter: Secondary | ICD-10-CM | POA: Diagnosis not present

## 2019-06-14 DIAGNOSIS — R0600 Dyspnea, unspecified: Secondary | ICD-10-CM | POA: Diagnosis not present

## 2019-06-14 DIAGNOSIS — Z7901 Long term (current) use of anticoagulants: Secondary | ICD-10-CM | POA: Diagnosis not present

## 2019-06-14 DIAGNOSIS — Z8551 Personal history of malignant neoplasm of bladder: Secondary | ICD-10-CM | POA: Diagnosis not present

## 2019-06-14 DIAGNOSIS — N400 Enlarged prostate without lower urinary tract symptoms: Secondary | ICD-10-CM

## 2019-06-14 DIAGNOSIS — R609 Edema, unspecified: Secondary | ICD-10-CM

## 2019-06-14 DIAGNOSIS — I5033 Acute on chronic diastolic (congestive) heart failure: Secondary | ICD-10-CM | POA: Diagnosis not present

## 2019-06-14 DIAGNOSIS — I480 Paroxysmal atrial fibrillation: Secondary | ICD-10-CM | POA: Diagnosis not present

## 2019-06-14 DIAGNOSIS — I872 Venous insufficiency (chronic) (peripheral): Secondary | ICD-10-CM | POA: Diagnosis not present

## 2019-06-14 DIAGNOSIS — I5031 Acute diastolic (congestive) heart failure: Secondary | ICD-10-CM

## 2019-06-14 DIAGNOSIS — R0609 Other forms of dyspnea: Secondary | ICD-10-CM | POA: Diagnosis present

## 2019-06-14 DIAGNOSIS — Z9981 Dependence on supplemental oxygen: Secondary | ICD-10-CM | POA: Diagnosis not present

## 2019-06-14 DIAGNOSIS — M1990 Unspecified osteoarthritis, unspecified site: Secondary | ICD-10-CM | POA: Diagnosis not present

## 2019-06-14 DIAGNOSIS — N401 Enlarged prostate with lower urinary tract symptoms: Secondary | ICD-10-CM | POA: Diagnosis not present

## 2019-06-14 DIAGNOSIS — I110 Hypertensive heart disease with heart failure: Principal | ICD-10-CM | POA: Diagnosis present

## 2019-06-14 DIAGNOSIS — Z9119 Patient's noncompliance with other medical treatment and regimen: Secondary | ICD-10-CM | POA: Diagnosis not present

## 2019-06-14 LAB — CBC WITH DIFFERENTIAL/PLATELET
Abs Immature Granulocytes: 0.12 10*3/uL — ABNORMAL HIGH (ref 0.00–0.07)
Basophils Absolute: 0.1 10*3/uL (ref 0.0–0.1)
Basophils Relative: 1 %
Eosinophils Absolute: 0.4 10*3/uL (ref 0.0–0.5)
Eosinophils Relative: 4 %
HCT: 34.2 % — ABNORMAL LOW (ref 39.0–52.0)
Hemoglobin: 9.3 g/dL — ABNORMAL LOW (ref 13.0–17.0)
Immature Granulocytes: 1 %
Lymphocytes Relative: 26 %
Lymphs Abs: 3.2 10*3/uL (ref 0.7–4.0)
MCH: 20.1 pg — ABNORMAL LOW (ref 26.0–34.0)
MCHC: 27.2 g/dL — ABNORMAL LOW (ref 30.0–36.0)
MCV: 74 fL — ABNORMAL LOW (ref 80.0–100.0)
Monocytes Absolute: 1.2 10*3/uL — ABNORMAL HIGH (ref 0.1–1.0)
Monocytes Relative: 10 %
Neutro Abs: 7.4 10*3/uL (ref 1.7–7.7)
Neutrophils Relative %: 58 %
Platelets: 584 10*3/uL — ABNORMAL HIGH (ref 150–400)
RBC: 4.62 MIL/uL (ref 4.22–5.81)
RDW: 20.9 % — ABNORMAL HIGH (ref 11.5–15.5)
WBC: 12.5 10*3/uL — ABNORMAL HIGH (ref 4.0–10.5)
nRBC: 0 % (ref 0.0–0.2)

## 2019-06-14 LAB — BASIC METABOLIC PANEL
Anion gap: 8 (ref 5–15)
BUN: 13 mg/dL (ref 8–23)
CO2: 29 mmol/L (ref 22–32)
Calcium: 8.9 mg/dL (ref 8.9–10.3)
Chloride: 103 mmol/L (ref 98–111)
Creatinine, Ser: 0.98 mg/dL (ref 0.61–1.24)
GFR calc Af Amer: >60 mL/min (ref >60)
GFR calc non Af Amer: >60 mL/min (ref >60)
Glucose, Bld: 129 mg/dL — ABNORMAL HIGH (ref 70–99)
Potassium: 3.8 mmol/L (ref 3.5–5.1)
Sodium: 140 mmol/L (ref 135–145)

## 2019-06-14 LAB — BRAIN NATRIURETIC PEPTIDE: B Natriuretic Peptide: 101 pg/mL — ABNORMAL HIGH (ref 0.0–100.0)

## 2019-06-14 LAB — RESPIRATORY PANEL BY RT PCR (FLU A&B, COVID)
Influenza A by PCR: NEGATIVE
Influenza B by PCR: NEGATIVE
SARS Coronavirus 2 by RT PCR: NEGATIVE

## 2019-06-14 MED ORDER — METHYLPREDNISOLONE SODIUM SUCC 40 MG IJ SOLR
40.0000 mg | Freq: Two times a day (BID) | INTRAMUSCULAR | Status: DC
  Administered 2019-06-15: 40 mg via INTRAVENOUS
  Filled 2019-06-14: qty 1

## 2019-06-14 MED ORDER — FUROSEMIDE 10 MG/ML IJ SOLN
40.0000 mg | Freq: Once | INTRAMUSCULAR | Status: AC
  Administered 2019-06-14: 12:00:00 40 mg via INTRAVENOUS
  Filled 2019-06-14: qty 4

## 2019-06-14 MED ORDER — VITAMIN B-12 1000 MCG PO TABS
500.0000 ug | ORAL_TABLET | Freq: Every day | ORAL | Status: DC
  Administered 2019-06-15 – 2019-06-19 (×5): 500 ug via ORAL
  Filled 2019-06-14 (×5): qty 1

## 2019-06-14 MED ORDER — IPRATROPIUM-ALBUTEROL 0.5-2.5 (3) MG/3ML IN SOLN
3.0000 mL | RESPIRATORY_TRACT | Status: DC
  Administered 2019-06-15 (×4): 3 mL via RESPIRATORY_TRACT
  Filled 2019-06-14 (×4): qty 3

## 2019-06-14 MED ORDER — ACETAMINOPHEN 325 MG PO TABS
650.0000 mg | ORAL_TABLET | ORAL | Status: DC | PRN
  Administered 2019-06-14 – 2019-06-15 (×3): 650 mg via ORAL
  Filled 2019-06-14 (×3): qty 2

## 2019-06-14 MED ORDER — PANTOPRAZOLE SODIUM 40 MG PO TBEC
40.0000 mg | DELAYED_RELEASE_TABLET | Freq: Every day | ORAL | Status: DC
  Administered 2019-06-15 – 2019-06-19 (×5): 40 mg via ORAL
  Filled 2019-06-14 (×5): qty 1

## 2019-06-14 MED ORDER — GUAIFENESIN ER 600 MG PO TB12
1200.0000 mg | ORAL_TABLET | Freq: Two times a day (BID) | ORAL | Status: DC
  Administered 2019-06-14 – 2019-06-19 (×10): 1200 mg via ORAL
  Filled 2019-06-14 (×10): qty 2

## 2019-06-14 MED ORDER — IPRATROPIUM-ALBUTEROL 0.5-2.5 (3) MG/3ML IN SOLN
3.0000 mL | RESPIRATORY_TRACT | Status: DC
  Administered 2019-06-14: 20:00:00 3 mL via RESPIRATORY_TRACT
  Filled 2019-06-14: qty 3

## 2019-06-14 MED ORDER — SODIUM CHLORIDE 0.9% FLUSH
3.0000 mL | Freq: Two times a day (BID) | INTRAVENOUS | Status: DC
  Administered 2019-06-14 – 2019-06-19 (×10): 3 mL via INTRAVENOUS

## 2019-06-14 MED ORDER — ZOLPIDEM TARTRATE 5 MG PO TABS: 5.0000 mg | ORAL_TABLET | Freq: Every evening | ORAL | Status: DC | PRN

## 2019-06-14 MED ORDER — DEXTROMETHORPHAN POLISTIREX ER 30 MG/5ML PO SUER: 30.0000 mg | Freq: Two times a day (BID) | ORAL | Status: DC | PRN

## 2019-06-14 MED ORDER — IPRATROPIUM-ALBUTEROL 0.5-2.5 (3) MG/3ML IN SOLN: 3.0000 mL | RESPIRATORY_TRACT | Status: DC

## 2019-06-14 MED ORDER — SODIUM CHLORIDE 0.9 % IV SOLN: 250.0000 mL | INTRAVENOUS | Status: DC | PRN

## 2019-06-14 MED ORDER — TAMSULOSIN HCL 0.4 MG PO CAPS
0.4000 mg | ORAL_CAPSULE | Freq: Every day | ORAL | Status: DC
  Administered 2019-06-14 – 2019-06-18 (×5): 0.4 mg via ORAL
  Filled 2019-06-14 (×5): qty 1

## 2019-06-14 MED ORDER — MOMETASONE FURO-FORMOTEROL FUM 200-5 MCG/ACT IN AERO
2.0000 | INHALATION_SPRAY | Freq: Two times a day (BID) | RESPIRATORY_TRACT | Status: DC
  Administered 2019-06-14 – 2019-06-19 (×10): 2 via RESPIRATORY_TRACT
  Filled 2019-06-14: qty 8.8

## 2019-06-14 MED ORDER — METOPROLOL TARTRATE 25 MG PO TABS
12.5000 mg | ORAL_TABLET | Freq: Two times a day (BID) | ORAL | Status: DC
  Administered 2019-06-14 – 2019-06-19 (×10): 12.5 mg via ORAL
  Filled 2019-06-14 (×10): qty 1

## 2019-06-14 MED ORDER — FUROSEMIDE 10 MG/ML IJ SOLN
40.0000 mg | Freq: Two times a day (BID) | INTRAMUSCULAR | Status: DC
  Administered 2019-06-14 – 2019-06-16 (×4): 40 mg via INTRAVENOUS
  Filled 2019-06-14 (×4): qty 4

## 2019-06-14 MED ORDER — ONDANSETRON HCL 4 MG/2ML IJ SOLN: 4.0000 mg | Freq: Four times a day (QID) | INTRAMUSCULAR | Status: DC | PRN

## 2019-06-14 MED ORDER — FAMOTIDINE 20 MG PO TABS
20.0000 mg | ORAL_TABLET | Freq: Every day | ORAL | Status: DC
  Administered 2019-06-14 – 2019-06-18 (×5): 20 mg via ORAL
  Filled 2019-06-14 (×5): qty 1

## 2019-06-14 MED ORDER — SODIUM CHLORIDE 0.9% FLUSH: 3.0000 mL | INTRAVENOUS | Status: DC | PRN

## 2019-06-14 MED ORDER — POTASSIUM CHLORIDE CRYS ER 20 MEQ PO TBCR
30.0000 meq | EXTENDED_RELEASE_TABLET | Freq: Two times a day (BID) | ORAL | Status: DC
  Administered 2019-06-14 – 2019-06-19 (×10): 30 meq via ORAL
  Filled 2019-06-14 (×10): qty 1

## 2019-06-14 MED ORDER — MONTELUKAST SODIUM 10 MG PO TABS
10.0000 mg | ORAL_TABLET | Freq: Every day | ORAL | Status: DC
  Administered 2019-06-14 – 2019-06-18 (×5): 10 mg via ORAL
  Filled 2019-06-14 (×5): qty 1

## 2019-06-14 MED ORDER — METHYLPREDNISOLONE SODIUM SUCC 125 MG IJ SOLR
125.0000 mg | Freq: Once | INTRAMUSCULAR | Status: AC
  Administered 2019-06-14: 12:00:00 125 mg via INTRAVENOUS
  Filled 2019-06-14: qty 2

## 2019-06-14 MED ORDER — APIXABAN 5 MG PO TABS
5.0000 mg | ORAL_TABLET | Freq: Two times a day (BID) | ORAL | Status: DC
  Administered 2019-06-14 – 2019-06-19 (×10): 5 mg via ORAL
  Filled 2019-06-14 (×10): qty 1

## 2019-06-14 MED ORDER — ALBUTEROL SULFATE HFA 108 (90 BASE) MCG/ACT IN AERS
8.0000 | INHALATION_SPRAY | Freq: Once | RESPIRATORY_TRACT | Status: AC
  Administered 2019-06-14: 12:00:00 8 via RESPIRATORY_TRACT
  Filled 2019-06-14: qty 6.7

## 2019-06-14 MED ORDER — HYDROXYZINE HCL 25 MG PO TABS
25.0000 mg | ORAL_TABLET | Freq: Every day | ORAL | Status: DC
  Administered 2019-06-14 – 2019-06-18 (×5): 25 mg via ORAL
  Filled 2019-06-14 (×5): qty 1

## 2019-06-14 MED ORDER — DOXYCYCLINE HYCLATE 100 MG PO TABS
100.0000 mg | ORAL_TABLET | Freq: Two times a day (BID) | ORAL | Status: DC
  Administered 2019-06-14 – 2019-06-19 (×10): 100 mg via ORAL
  Filled 2019-06-14 (×10): qty 1

## 2019-06-14 NOTE — Plan of Care (Signed)
  Problem: Education: Goal: Knowledge of General Education information will improve Description: Including pain rating scale, medication(s)/side effects and non-pharmacologic comfort measures Outcome: Progressing   Problem: Activity: Goal: Risk for activity intolerance will decrease Outcome: Progressing   Problem: Nutrition: Goal: Adequate nutrition will be maintained Outcome: Progressing   Problem: Elimination: Goal: Will not experience complications related to bowel motility Outcome: Progressing   Problem: Safety: Goal: Ability to remain free from injury will improve Outcome: Progressing   Problem: Skin Integrity: Goal: Risk for impaired skin integrity will decrease Outcome: Progressing

## 2019-06-14 NOTE — Progress Notes (Signed)
Foley was not inserted. Anatomically, patient can wear external catheter.  Dr. Wynetta Emery was made aware and agreed to try the external catheter before foley insertion.

## 2019-06-14 NOTE — Progress Notes (Signed)
SUBJECTIVE: The patient presents for overdue follow-up.  I have evaluated him once before in November 2019.  Past medical history includes peripheral vascular disease, paroxysmal atrial fibrillation (requiring direct-current cardioversion on 02/06/2017), hypertension, hyperthyroidism, COPD, prior tobacco use, nonobstructive coronary artery disease, and obstructive sleep apnea.  He has been having bilateral leg swelling and pain.  ECG performed today which I personally reviewed demonstrates sinus rhythm with frequent PVCs.  He was hospitalized for a COPD exacerbation in December 2020.  Echocardiogram on 03/12/2019 demonstrated normal LV systolic function, EF [id number], with indeterminate diastolic function.  He has been struggling with bilateral leg swelling since the fall. He weighed 195 pounds when I initially evaluated him in November 2019. He weighed 244 pounds on 03/11/2019 and weighs 255 pounds today. He complains of bilateral leg pain when walking about 10 feet. He has been having difficulty with breathing over the last several nights as well. He has occasional chest tightness with inspiration but denies exertional chest pain.  His wife wants to take him to the emergency room.  Review of Systems: As per "subjective", otherwise negative.  Allergies  Allergen Reactions  . Adhesive [Tape] Other (See Comments)    SKIN IS VERY THIN AND IT TEARS VERY EASILY!!! Please use an alternative  . Aspartame And Phenylalanine Nausea Only  . Other Rash    Pt reports rash on back, buttocks, shoulders and stomach after arteriogram  . Sulfonamide Derivatives Nausea And Vomiting    Current Outpatient Medications  Medication Sig Dispense Refill  . albuterol (PROVENTIL HFA;VENTOLIN HFA) 108 (90 BASE) MCG/ACT inhaler Inhale 1-2 puffs into the lungs every 6 (six) hours as needed for wheezing or shortness of breath. Reported on 10/03/2015    . amLODipine (NORVASC) 10 MG tablet Take 1 tablet (10 mg  total) by mouth daily. 30 tablet 5  . apixaban (ELIQUIS) 5 MG TABS tablet Take 1 tablet (5 mg total) 2 (two) times daily by mouth. 60 tablet 0  . budesonide-formoterol (SYMBICORT) 160-4.5 MCG/ACT inhaler Inhale 2 puffs into the lungs 2 (two) times daily. 1 Inhaler 0  . diphenhydrAMINE (BENADRYL) 25 MG tablet Take 25 mg by mouth every 6 (six) hours as needed for itching or allergies.     [name] docusate sodium (COLACE) 100 MG capsule Take 1 capsule (100 mg total) by mouth 2 (two) times daily. (Patient taking differently: Take 100 mg by mouth as needed. ) 10 capsule 0  . famotidine (PEPCID) 20 MG tablet TAKE (1) TABLET BY MOUTH AT BEDTIME. 30 tablet 0  . furosemide (LASIX) 40 MG tablet Take 1 tablet (40 mg total) by mouth daily with breakfast. 30 tablet 11  . hydrOXYzine (ATARAX/VISTARIL) 25 MG tablet Take 1 tablet (25 mg total) by mouth at bedtime. 30 tablet 0  . ipratropium-albuterol (DUONEB) 0.5-2.5 (3) MG/3ML SOLN Take 3 mLs by nebulization every 4 (four) hours as needed. 360 mL 0  . metoprolol tartrate (LOPRESSOR) 25 MG tablet Take 0.5 tablets (12.5 mg total) by mouth 2 (two) times daily. 30 tablet 3  . montelukast (SINGULAIR) 10 MG tablet TAKE (1) TABLET BY MOUTH AT BEDTIME. 30 tablet 1  . pantoprazole (PROTONIX) 40 MG tablet TAKE 1 TABLET BY MOUTH 30 TO 60 MINUTES PRIOR TO THE FIRST MEAL OF THE DAY. 30 tablet 0  . polyethylene glycol-electrolytes (TRILYTE) 420 g solution Take 4,000 mLs by mouth as directed. 4000 mL 0  . Tamsulosin HCl (FLOMAX) 0.4 MG CAPS Take 0.4 mg by mouth daily after  supper.     [name] acetaminophen (TYLENOL) 500 MG tablet Take 1,000 mg by mouth every 6 (six) hours as needed (for pain or headaches).      No current facility-administered medications for this visit.    Past Medical History:  Diagnosis Date  . AAA (abdominal aortic aneurysm) ([place])   . Anxiety    due to pain in leg  . Arthritis    Osteo  . Bell's palsy   . Cancer [facility])    Bladder cancer  . Cataracts,  bilateral   . COPD (chronic obstructive pulmonary disease) ([facility])    Albuterol inhaler as needed  . Diverticulitis   . Enlarged prostate    takes Flomax daily  . GERD (gastroesophageal reflux disease)    was on Prilosec but has been off for yrs-no issues now with reflux  . Headache    migraines years ago  . History of bronchitis    > 58yrs ago  . History of colon polyps    benign  . History of dizziness    several yrs ago-was on Meclizine but has been off for several yrs-no problems since  . History of hiatal hernia   . Hypertension    takes Amlodipine and Lisinpril daily  . Joint pain   . Joint swelling   . Nocturia   . PAD (peripheral artery disease) ([facility])     bilateral superficial femoral artery occlusions   . Paroxysmal atrial fibrillation ([facility])   . Pneumonia    several times with most recent within 5 yrs ago  . Restless leg syndrome   . Rupture of left quadriceps tendon   . Shortness of breath dyspnea    with exertion  . Sleep apnea    does not use cpap  . Urinary urgency     Past Surgical History:  Procedure Laterality Date  . BICEPS TENDON REPAIR     LEFT  . CARDIOVERSION N/A 02/06/2017   Procedure: CARDIOVERSION;  Surgeon: Jerline Pain, MD;  Location: [facility] ENDOSCOPY;  Service: Cardiovascular;  Laterality: N/A;  . CARPAL TUNNEL RELEASE Right 2014  . CHOLECYSTECTOMY  July 16,2010  . COLONOSCOPY    . CYSTOSCOPY    . ESOPHAGOGASTRODUODENOSCOPY    . FEMORAL-POPLITEAL BYPASS GRAFT Right 09/19/2015   Procedure: RIGHT COMMON FEMORAL-ABOVE KNEE POPLITEAL ARTERY    BYPASS GRAFT ;  Surgeon: Conrad Scandia, MD;  Location: [place];  Service: Vascular;  Laterality: Right;  . FOOT SURGERY Right    x 5t  . HAND SURGERY Right   . HERNIA REPAIR Bilateral    inguinal  . PERIPHERAL VASCULAR CATHETERIZATION N/A 08/02/2015   Procedure: Abdominal Aortogram w/Lower Extremity;  Surgeon: Conrad , MD;  Location: [place] [facility];  Service: Cardiovascular;  Laterality: N/A;  .  QUADRICEPS TENDON REPAIR Left 08/22/2016   Procedure: LEFT OPEN QUADRICEP TENDON REPAIR;  Surgeon: Netta Cedars, MD;  Location: [place];  Service: Orthopedics;  Laterality: Left;  . REVERSE SHOULDER ARTHROPLASTY Right 07/07/2014   Procedure: RIGHT REVERSE TOTAL SHOULDER ;  Surgeon: Netta Cedars, MD;  Location: [place];  Service: Orthopedics;  Laterality: Right;  . REVERSE TOTAL SHOULDER ARTHROPLASTY Right 07/07/2014   Dr Veverly Fells  . ROTATOR CUFF REPAIR     Bilateral   . TEE WITHOUT CARDIOVERSION N/A 02/06/2017   Procedure: TRANSESOPHAGEAL ECHOCARDIOGRAM (TEE);  Surgeon: Jerline Pain, MD;  Location: [facility] ENDOSCOPY;  Service: Cardiovascular;  Laterality: N/A;  . TONSILLECTOMY    . tumor removed from bladder    .  VEIN HARVEST Right 09/19/2015   Procedure: WITH NON REVERSE RIGHT GREATER SAPHENOUS VEIN HARVEST;  Surgeon: Conrad [place], MD;  Location: [place];  Service: Vascular;  Laterality: Right;    Social History   Socioeconomic History  . Marital status: Married    Spouse name: Not on file  . Number of children: Not on file  . Years of education: Not on file  . Highest education level: Not on file  Occupational History  . Not on file  Tobacco Use  . Smoking status: Former Smoker    Packs/day: 1.00    Years: 45.00    Pack years: 45.00    Types: Cigars    Quit date: 01/07/2019    Years since quitting: 0.4  . Smokeless tobacco: Never Used  Substance and Sexual Activity  . Alcohol use: Yes    Alcohol/week: 0.0 standard drinks    Comment: wine occasionally  . Drug use: Not Currently    Types: Marijuana  . Sexual activity: Yes  Other Topics Concern  . Not on file  Social History Narrative   Pt lives w/ wife in [place], [place]   Social Determinants of Health   Financial Resource Strain:   . Difficulty of Paying Living Expenses:   Food Insecurity:   . Worried About Charity fundraiser in the Last Year:   . Arboriculturist in the Last Year:   Transportation Needs:   . Lexicographer (Medical):   [name] Lack of Transportation (Non-Medical):   Physical Activity:   . Days of Exercise per Week:   . Minutes of Exercise per Session:   Stress:   . Feeling of Stress :   Social Connections:   . Frequency of Communication with Friends and Family:   . Frequency of Social Gatherings with Friends and Family:   . Attends Religious Services:   . Active Member of Clubs or Organizations:   . Attends Archivist Meetings:   [name] Marital Status:   Intimate Partner Violence:   . Fear of Current or Ex-Partner:   . Emotionally Abused:   [name] Physically Abused:   . Sexually Abused:      Vitals:   06/14/19 1005  BP: 118/72  Pulse: 77  SpO2: 97%  Weight: 255 lb (115.7 kg)  Height: 5\' 11"  (1.803 m)    Wt Readings from Last 3 Encounters:  06/14/19 255 lb (115.7 kg)  05/18/19 242 lb 9.6 oz (110 kg)  03/11/19 244 lb (110.7 kg)     PHYSICAL EXAM General: Mildly tachypneic, using oxygen by nasal cannula HEENT: Normal. Neck: No JVD, no thyromegaly. Lungs: Diminished sounds throughout, bilateral inspiratory wheezes. CV: Regular rate and rhythm, normal S1/S2, no S3/S4, no murmur. 1-2+ pitting bilateral lower extremity edema.    Abdomen: Firm, obese. Neurologic: Alert and oriented.  Psych: Normal affect. Skin: Normal. Musculoskeletal: No gross deformities.      Labs: Lab Results  Component Value Date/Time   K 4.1 05/18/2019 03:48 PM   BUN 15 05/18/2019 03:48 PM   CREATININE 0.98 05/18/2019 03:48 PM   ALT 29 03/11/2019 12:55 PM   TSH 0.60 05/18/2018 09:40 AM   HGB 9.4 (L) 03/12/2019 06:10 AM   HGB 9.8 (L) 03/03/2019 10:22 AM     Lipids: Lab Results  Component Value Date/Time   LDLCALC  06/29/2008 02:00 AM    99        Total Cholesterol/HDL:CHD Risk Coronary Heart Disease Risk Table  Men   Women  1/2 Average Risk   3.4   3.3  Average Risk       5.0   4.4  2 X Average Risk   9.6   7.1  3 X Average Risk  23.4   11.0          Use the calculated Patient Ratio above and the CHD Risk Table to determine the patient's CHD Risk.        ATP III CLASSIFICATION (LDL):  <100     mg/dL   Optimal  100-129  mg/dL   Near or Above                    Optimal  130-159  mg/dL   Borderline  160-189  mg/dL   High  >190     mg/dL   Very High   CHOL  06/29/2008 02:00 AM    153        ATP III CLASSIFICATION:  <200     mg/dL   Desirable  200-239  mg/dL   Borderline High  >=240    mg/dL   High          TRIG 163 (H) 03/11/2019 02:59 PM   HDL 29 (L) 06/29/2008 02:00 AM      He underwent coronary CT angiography on 04/17/2017 which demonstrated nonobstructive coronary artery disease.  There was less than 30% proximal LAD stenosis, mid LAD stenosis of 50%, and less than 30% distal LAD stenosis.  There was less than 30% RCA stenosis.   Echocardiogram 03/12/2019:  1. Left ventricular ejection fraction, by visual estimation, is 60 to  65%. The left ventricle has normal function. There is borderline left  ventricular hypertrophy.  2. Left ventricular diastolic parameters are indeterminate.  3. The left ventricle has no regional wall motion abnormalities.  4. Global right ventricle has normal systolic function.The right  ventricular size is normal. No increase in right ventricular wall  thickness.  5. Left atrial size was normal.  6. Right atrial size was normal.  7. Presence of pericardial fat pad.  8. Trivial pericardial effusion is present.  9. The mitral valve is normal in structure. Trivial mitral valve  regurgitation.  10. The tricuspid valve is normal in structure. Tricuspid valve  regurgitation is trivial.  11. The aortic valve is normal in structure. Aortic valve regurgitation is  not visualized. Mild aortic valve sclerosis without stenosis.  12. The pulmonic valve was not well visualized. Pulmonic valve  regurgitation is not visualized.  13. The atrial septum is grossly normal.    ASSESSMENT AND  PLAN:  1.  Paroxysmal atrial fibrillation: Symptomatically stable.  Anticoagulated with apixaban.  Currently on Lopressor 12.5 mg twice daily.  No changes to therapy.  2.  COPD exacerbation:  He is mildly tachypneic and has been struggling to breathe for the past few nights. He needs a chest x-ray and likely IV Solu-Medrol and antibiotic therapy. He plans to go to the [facility] ED. He has a long history of tobacco abuse but has since quit. He is followed by pulmonology.  3.  Hypertension: Controlled on present therapy.  No changes.  4.  Peripheral arterial disease: Not on aspirin as he is on Eliquis.  He would benefit from statin therapy. He has been having bilateral leg pain. I have recommended he make a follow-up appointment with vascular surgery as he likely needs ABIs.  5.  Coronary artery disease: This is nonobstructive with  coronary CT angiography results detailed above.  6. Bilateral leg edema/acute on chronic diastolic heart failure: His weight is up 11 pounds from mid December 2020 and up nearly 60 pounds from November 2019. He is mildly tachypneic. He needs a chest x-ray. His wife would like to take him to the [facility] ED. He will require IV diuresis for several days.   Disposition: Follow up in the cardiology office after hospitalization  Time spent: 40 minutes, of which greater than 50% was spent reviewing symptoms, relevant blood tests and studies, and discussing management plan with the patient.    Kate Sable, M.D., F.A.C.C.

## 2019-06-14 NOTE — ED Provider Notes (Signed)
[facility] EMERGENCY DEPARTMENT Provider Note   CSN: [id number] Arrival date & time: 06/14/19  1131     History Chief Complaint  Patient presents with  . Leg Swelling  . Shortness of Breath    Derrick Moon is a 79 y.o. male.  He is a history of COPD and is on 2 to 3 L of oxygen 24/7.  Also has a history of CHF.  Also has peripheral edema and is on diuretics.  He is complaining of 5 years of shortness of breath but worsened over the last 6 months.  Lower extremity cramps.  He also had some right lateral abdominal pain that is been there for a while and he said he was supposed to get an EGD and a colonoscopy but with Covid things got pushed back.  He saw his cardiologist today for the first time in a while, Dr. Bronson Ing who recommended he come to the emergency department for admission and diuresis along with treatment for COPD.  Patient denies any fevers.  Cough sometimes productive of some sputum.  Difficulty laying down flat.  Swelling in his legs causing him difficulty in ambulating.  The history is provided by the patient.  Shortness of Breath Severity:  Severe Onset quality:  Gradual Timing:  Intermittent Progression:  Worsening Chronicity:  Recurrent Context: activity   Relieved by:  Nothing Worsened by:  Activity Ineffective treatments:  Diuretics, inhaler, rest, oxygen and sitting up Associated symptoms: abdominal pain and cough   Associated symptoms: no chest pain, no fever, no headaches, no hemoptysis, no rash, no sore throat and no vomiting        Past Medical History:  Diagnosis Date  . AAA (abdominal aortic aneurysm) ([place])   . Anxiety    due to pain in leg  . Arthritis    Osteo  . Bell's palsy   . Cancer [facility])    Bladder cancer  . Cataracts, bilateral   . COPD (chronic obstructive pulmonary disease) ([facility])    Albuterol inhaler as needed  . Diverticulitis   . Enlarged prostate    takes Flomax daily  . GERD (gastroesophageal reflux disease)    was on  Prilosec but has been off for yrs-no issues now with reflux  . Headache    migraines years ago  . History of bronchitis    > 52yrs ago  . History of colon polyps    benign  . History of dizziness    several yrs ago-was on Meclizine but has been off for several yrs-no problems since  . History of hiatal hernia   . Hypertension    takes Amlodipine and Lisinpril daily  . Joint pain   . Joint swelling   . Nocturia   . PAD (peripheral artery disease) (H[facility])     bilateral superficial femoral artery occlusions   . Paroxysmal atrial fibrillation ([facility])   . Pneumonia    several times with most recent within 5 yrs ago  . Restless leg syndrome   . Rupture of left quadriceps tendon   . Shortness of breath dyspnea    with exertion  . Sleep apnea    does not use cpap  . Urinary urgency     Patient Active Problem List   Diagnosis Date Noted  . Enlarged prostate   . GERD (gastroesophageal reflux disease)   . B12 deficiency 02/10/2019  . IDA (iron deficiency anemia) 02/08/2019  . Abdominal distention 02/08/2019  . Tobacco use 01/01/2018  . COPD exacerbation ([place])  12/31/2017  . Otitis media 12/31/2017  . Bronchiectasis without complication ([place]) [id number]  . Acute maxillary sinusitis 08/20/2017  . Cough variant asthma vs uacs  07/17/2017  . Abnormal thyroid blood test 05/07/2017  . Pulmonary infiltrates 02/20/2017  . PAF (paroxysmal atrial fibrillation) ([place]) 02/02/2017  . COPD with acute exacerbation ([place]) 02/02/2017  . Acute respiratory failure with hypoxemia ([place]) 02/02/2017  . Atrial fibrillation with RVR ([place]) 02/02/2017  . Acute on chronic respiratory failure ([place]) 02/02/2017  . SIRS (systemic inflammatory response syndrome) ([place]) 02/02/2017  . Cigarette smoker 12/14/2016  . DOE (dyspnea on exertion) 12/12/2015  . COPD GOLD II  12/12/2015  . Atherosclerosis of native arteries of right leg with ulceration of other part of foot ([place]) 09/19/2015  . Atherosclerosis of native  arteries of the extremities with ulceration ([place]) 07/27/2015  . S/P shoulder replacement 07/07/2014  . Aortic ectasia, abdominal ([place]) 02/26/2012  . Essential hypertension 02/26/2009  . DIVERTICULITIS, HX OF 02/26/2009    Past Surgical History:  Procedure Laterality Date  . BICEPS TENDON REPAIR     LEFT  . CARDIOVERSION N/A 02/06/2017   Procedure: CARDIOVERSION;  Surgeon: Jerline Pain, MD;  Location: [facility] ENDOSCOPY;  Service: Cardiovascular;  Laterality: N/A;  . CARPAL TUNNEL RELEASE Right 2014  . CHOLECYSTECTOMY  July 16,2010  . COLONOSCOPY    . CYSTOSCOPY    . ESOPHAGOGASTRODUODENOSCOPY    . FEMORAL-POPLITEAL BYPASS GRAFT Right 09/19/2015   Procedure: RIGHT COMMON FEMORAL-ABOVE KNEE POPLITEAL ARTERY    BYPASS GRAFT ;  Surgeon: Conrad Waverly, MD;  Location: [place];  Service: Vascular;  Laterality: Right;  . FOOT SURGERY Right    x 5t  . HAND SURGERY Right   . HERNIA REPAIR Bilateral    inguinal  . PERIPHERAL VASCULAR CATHETERIZATION N/A 08/02/2015   Procedure: Abdominal Aortogram w/Lower Extremity;  Surgeon: Conrad [place], MD;  Location: [place] CV LAB;  Service: Cardiovascular;  Laterality: N/A;  . QUADRICEPS TENDON REPAIR Left 08/22/2016   Procedure: LEFT OPEN QUADRICEP TENDON REPAIR;  Surgeon: Netta Cedars, MD;  Location: [place];  Service: Orthopedics;  Laterality: Left;  . REVERSE SHOULDER ARTHROPLASTY Right 07/07/2014   Procedure: RIGHT REVERSE TOTAL SHOULDER ;  Surgeon: Netta Cedars, MD;  Location: [place];  Service: Orthopedics;  Laterality: Right;  . REVERSE TOTAL SHOULDER ARTHROPLASTY Right 07/07/2014   Dr Veverly Fells  . ROTATOR CUFF REPAIR     Bilateral   . TEE WITHOUT CARDIOVERSION N/A 02/06/2017   Procedure: TRANSESOPHAGEAL ECHOCARDIOGRAM (TEE);  Surgeon: Jerline Pain, MD;  Location: [facility] ENDOSCOPY;  Service: Cardiovascular;  Laterality: N/A;  . TONSILLECTOMY    . tumor removed from bladder    . VEIN HARVEST Right 09/19/2015   Procedure: WITH NON REVERSE RIGHT GREATER  SAPHENOUS VEIN HARVEST;  Surgeon: Conrad [place], MD;  Location: [place];  Service: Vascular;  Laterality: Right;       Family History  Problem Relation Age of Onset  . Parkinson's disease Mother   . AAA (abdominal aortic aneurysm) Father   . Heart defect Sister        Congenital Heart Disease  . Heart disease Sister     Social History   Tobacco Use  . Smoking status: Former Smoker    Packs/day: 1.00    Years: 45.00    Pack years: 45.00    Types: Cigars    Quit date: 01/07/2019    Years since quitting: 0.4  . Smokeless tobacco: Never Used  Substance Use  Topics  . Alcohol use: Yes    Alcohol/week: 0.0 standard drinks    Comment: wine occasionally  . Drug use: Not Currently    Types: Marijuana    Home Medications Prior to Admission medications   Medication Sig Start Date End Date Taking? Authorizing Provider  acetaminophen (TYLENOL) 500 MG tablet Take 1,000 mg by mouth every 6 (six) hours as needed (for pain or headaches).     [provider]  albuterol (PROVENTIL HFA;VENTOLIN HFA) 108 (90 BASE) MCG/ACT inhaler Inhale 1-2 puffs into the lungs every 6 (six) hours as needed for wheezing or shortness of breath. Reported on 10/03/2015    [provider]  amLODipine (NORVASC) 10 MG tablet Take 1 tablet (10 mg total) by mouth daily. 03/14/19   Roxan Hockey, MD  apixaban (ELIQUIS) 5 MG TABS tablet Take 1 tablet (5 mg total) 2 (two) times daily by mouth. 02/08/17   Cristal Ford, DO  budesonide-formoterol (SYMBICORT) 160-4.5 MCG/ACT inhaler Inhale 2 puffs into the lungs 2 (two) times daily. 01/04/18   Lavina Hamman, MD  diphenhydrAMINE (BENADRYL) 25 MG tablet Take 25 mg by mouth every 6 (six) hours as needed for itching or allergies.     [provider]  docusate sodium (COLACE) 100 MG capsule Take 1 capsule (100 mg total) by mouth 2 (two) times daily. Patient taking differently: Take 100 mg by mouth as needed.  01/04/18   Lavina Hamman, MD    famotidine (PEPCID) 20 MG tablet TAKE (1) TABLET BY MOUTH AT BEDTIME. 04/19/19   Tanda Rockers, MD  furosemide (LASIX) 40 MG tablet Take 1 tablet (40 mg total) by mouth daily with breakfast. 03/14/19 03/13/20  Roxan Hockey, MD  hydrOXYzine (ATARAX/VISTARIL) 25 MG tablet Take 1 tablet (25 mg total) by mouth at bedtime. 03/14/19   Emokpae, Courage, MD  ipratropium-albuterol (DUONEB) 0.5-2.5 (3) MG/3ML SOLN Take 3 mLs by nebulization every 4 (four) hours as needed. 03/14/19   Roxan Hockey, MD  metoprolol tartrate (LOPRESSOR) 25 MG tablet Take 0.5 tablets (12.5 mg total) by mouth 2 (two) times daily. 03/14/19   Roxan Hockey, MD  montelukast (SINGULAIR) 10 MG tablet TAKE (1) TABLET BY MOUTH AT BEDTIME. 08/12/18   Tanda Rockers, MD  pantoprazole (PROTONIX) 40 MG tablet TAKE 1 TABLET BY MOUTH 30 TO 60 MINUTES PRIOR TO THE FIRST MEAL OF THE DAY. 09/15/18   Tanda Rockers, MD  polyethylene glycol-electrolytes (TRILYTE) 420 g solution Take 4,000 mLs by mouth as directed. 03/21/19   Rourk, [name], MD  Tamsulosin HCl (FLOMAX) 0.4 MG CAPS Take 0.4 mg by mouth daily after supper.     [provider]    Allergies    Adhesive [tape], Aspartame and phenylalanine, Other, and Sulfonamide derivatives  Review of Systems   Review of Systems  Constitutional: Negative for fever.  HENT: Negative for sore throat.   Eyes: Negative for visual disturbance.  Respiratory: Positive for cough and shortness of breath. Negative for hemoptysis.   Cardiovascular: Positive for leg swelling. Negative for chest pain.  Gastrointestinal: Positive for abdominal pain. Negative for vomiting.  Genitourinary: Negative for dysuria.  Musculoskeletal: Positive for gait problem.  Skin: Negative for rash.  Neurological: Negative for headaches.    Physical Exam Updated Vital Signs BP 129/83 (BP Location: Right Arm)   Pulse 75   Temp 98.2 F (36.8 C) (Oral)   Resp (!) 26   Ht 5\' 11"  (1.803 m)   Wt 115.7 kg  SpO2 100%   BMI 35.57 kg/m   Physical Exam Vitals and nursing note reviewed.  Constitutional:      Appearance: He is well-developed.  HENT:     Head: Normocephalic and atraumatic.  Eyes:     Conjunctiva/sclera: Conjunctivae normal.  Cardiovascular:     Rate and Rhythm: Normal rate and regular rhythm.     Heart sounds: No murmur.  Pulmonary:     Effort: Tachypnea and accessory muscle usage present. No respiratory distress.     Breath sounds: Wheezing present.  Abdominal:     Palpations: Abdomen is soft.     Tenderness: There is abdominal tenderness (right mid lateral abdomen). There is no guarding or rebound.  Musculoskeletal:        General: No deformity.     Cervical back: Neck supple.     Right lower leg: Edema present.     Left lower leg: Edema present.  Skin:    General: Skin is warm and dry.     Capillary Refill: Capillary refill takes less than 2 seconds.  Neurological:     General: No focal deficit present.     Mental Status: He is alert.     ED Results / Procedures / Treatments   Labs (all labs ordered are listed, but only abnormal results are displayed) Labs Reviewed  BASIC METABOLIC PANEL - Abnormal; Notable for the following components:      Result Value   Glucose, Bld 129 (*)    All other components within normal limits  CBC WITH DIFFERENTIAL/PLATELET - Abnormal; Notable for the following components:   WBC 12.5 (*)    Hemoglobin 9.3 (*)    HCT 34.2 (*)    MCV 74.0 (*)    MCH 20.1 (*)    MCHC 27.2 (*)    RDW 20.9 (*)    Platelets 584 (*)    Monocytes Absolute 1.2 (*)    Abs Immature Granulocytes 0.12 (*)    All other components within normal limits  BRAIN NATRIURETIC PEPTIDE - Abnormal; Notable for the following components:   B Natriuretic Peptide 101.0 (*)    All other components within normal limits  RESPIRATORY PANEL BY RT PCR (FLU A&B, COVID)  CBC WITH DIFFERENTIAL/PLATELET  COMPREHENSIVE METABOLIC PANEL  MAGNESIUM  BRAIN NATRIURETIC  PEPTIDE    EKG EKG Interpretation  Date/Time:  Tuesday June 14 2019 11:51:52 EDT Ventricular Rate:  78 PR Interval:    QRS Duration: 91 QT Interval:  372 QTC Calculation: 424 R Axis:   66 Text Interpretation: Sinus rhythm Short PR interval Probable anteroseptal infarct, old No significant change since 10/19 Confirmed by Aletta Edouard [phone]) on 06/14/2019 11:55:33 AM   Radiology DG Chest Port 1 View  Result Date: 06/14/2019 CLINICAL DATA:  Shortness of breath EXAM: PORTABLE CHEST 1 VIEW COMPARISON:  March 11, 2019 FINDINGS: Lungs are clear. Heart size and pulmonary vascularity are normal. No adenopathy. There is a total shoulder replacement on the right. Postoperative change noted in the left humeral head region. IMPRESSION: Lungs clear.  Cardiac silhouette within normal limits. Electronically Signed   By: Lowella Grip III M.D.   On: 06/14/2019 12:26    Procedures Procedures (including critical care time)  Medications Ordered in ED Medications  sodium chloride flush (NS) 0.9 % injection 3 mL (has no administration in time range)  sodium chloride flush (NS) 0.9 % injection 3 mL (has no administration in time range)  0.9 %  sodium chloride infusion (has no administration in  time range)  acetaminophen (TYLENOL) tablet 650 mg (650 mg Oral Given 06/14/19 1736)  ondansetron (ZOFRAN) injection 4 mg (has no administration in time range)  furosemide (LASIX) injection 40 mg (40 mg Intravenous Given 06/14/19 1736)  potassium chloride (KLOR-CON) CR tablet 30 mEq (has no administration in time range)  zolpidem (AMBIEN) tablet 5 mg (has no administration in time range)  methylPREDNISolone sodium succinate (SOLU-MEDROL) 40 mg/mL injection 40 mg (has no administration in time range)  doxycycline (VIBRA-TABS) tablet 100 mg (has no administration in time range)  ipratropium-albuterol (DUONEB) 0.5-2.5 (3) MG/3ML nebulizer solution 3 mL (has no administration in time range)  metoprolol  tartrate (LOPRESSOR) tablet 12.5 mg (has no administration in time range)  hydrOXYzine (ATARAX/VISTARIL) tablet 25 mg (has no administration in time range)  famotidine (PEPCID) tablet 20 mg (has no administration in time range)  pantoprazole (PROTONIX) EC tablet 40 mg (has no administration in time range)  tamsulosin (FLOMAX) capsule 0.4 mg (0.4 mg Oral Given 06/14/19 1736)  apixaban (ELIQUIS) tablet 5 mg (has no administration in time range)  vitamin B-12 (CYANOCOBALAMIN) tablet 500 mcg (has no administration in time range)  mometasone-formoterol (DULERA) 200-5 MCG/ACT inhaler 2 puff (has no administration in time range)  guaiFENesin (MUCINEX) 12 hr tablet 1,200 mg (has no administration in time range)  montelukast (SINGULAIR) tablet 10 mg (has no administration in time range)  dextromethorphan (DELSYM) 30 MG/5ML liquid 30 mg (has no administration in time range)  furosemide (LASIX) injection 40 mg (40 mg Intravenous Given 06/14/19 1207)  methylPREDNISolone sodium succinate (SOLU-MEDROL) 125 mg/2 mL injection 125 mg (125 mg Intravenous Given 06/14/19 1212)  albuterol (VENTOLIN HFA) 108 (90 Base) MCG/ACT inhaler 8 puff (8 puffs Inhalation Given 06/14/19 1208)    ED Course  I have reviewed the triage vital signs and the nursing notes.  Pertinent labs & imaging results that were available during my care of the patient were reviewed by me and considered in my medical decision making (see chart for details).  Clinical Course as of Jun 13 1798  Tue Jun 14, 2019  1206 Differential includes CHF, COPD, anemia, metabolic derangement, pneumonia, pneumothorax   [MB]  1234 Chest x-ray interpreted by me as COPD no gross infiltrates, no pneumothorax.   [MB]  1300 Discussed with Dr. Wynetta Emery from [facility] hospitalist who will evaluate the patient for admission.   [MB]    Clinical Course User Index [MB] Hayden Rasmussen, MD   MDM Rules/Calculators/A&P                       Final Clinical Impression(s)  / ED Diagnoses Final diagnoses:  COPD exacerbation ([place])  Peripheral edema    Rx / DC Orders ED Discharge Orders    None       Hayden Rasmussen, MD 06/14/19 [phone]

## 2019-06-14 NOTE — H&P (Addendum)
History and Physical  [place] [id number] DOB: 07-Sep-1940 DOA: 06/14/2019  PCP: [name], MD  Patient coming from: Home   I have personally briefly reviewed patient's old medical records in [place]  Chief Complaint: SOB/leg swelling   HPI: Derrick Moon is a 79 y.o. male with diagnosis of Gold III COPD, chronic diastolic CHF, chronic venous stasis edema of BLEs presented to the ED with his wife after they had discussed with cardiology earlier today during an office visit.  The patient had concerns of increasing shortness of breath and lower extremity edema that has progressively gotten worse over the past several months.  He is also had abnormal weight gain over the past several months.  His weight has increased from 195 pounds in November 20 19 to 255 pounds today.  He has shortness of breath with ambulation walking about 10 feet.  He has PND.  He has exertional chest discomfort.  The patient says that he has been taking his Lasix as prescribed but is not diuresing very well.  The patient reports that he is having difficulty sleeping at night due to wheezing coughing and shortness of breath.  His echocardiogram from December 2020 showed normal LV systolic function with indeterminate diastolic function.  The patient is very frustrated that he is having multiple recurrent hospitalizations for the same types of symptoms.  He feels that he gets tuned up in the hospital, discharged and then 2 weeks later he becomes symptomatic again.  ED Course: Patient arrived coughing and wheezing and shortness of breath reported.  He was placed on 3 L nasal cannula with improvement in pulse ox.  His weight was 115.7 kg.  His blood pressure was 128/82.  He was tachypneic with a respiratory rate of 25.  He was afebrile with a temperature of 98.2.  His BNP was 101.  WBC 12.5.  Hemoglobin 9.3, platelet count 584.  Chest x-ray with no acute findings.  Influenza a and B tests negative.   SARS 2 coronavirus test negative.  EKG with normal sinus rhythm.  Patient was given IV Lasix 40 mg.  Patient was started on IV steroids.  Admission was requested for further management.  Review of Systems: As per HPI otherwise 10 point review of systems negative.    Past Medical History:  Diagnosis Date  . AAA (abdominal aortic aneurysm) ([place])   . Anxiety    due to pain in leg  . Arthritis    Osteo  . Bell's palsy   . Cancer [facility])    Bladder cancer  . Cataracts, bilateral   . COPD (chronic obstructive pulmonary disease) (HCC)    Albuterol inhaler as needed  . Diverticulitis   . Enlarged prostate    takes Flomax daily  . GERD (gastroesophageal reflux disease)    was on Prilosec but has been off for yrs-no issues now with reflux  . Headache    migraines years ago  . History of bronchitis    > 18yrs ago  . History of colon polyps    benign  . History of dizziness    several yrs ago-was on Meclizine but has been off for several yrs-no problems since  . History of hiatal hernia   . Hypertension    takes Amlodipine and Lisinpril daily  . Joint pain   . Joint swelling   . Nocturia   . PAD (peripheral artery disease) (HCC)     bilateral superficial femoral artery occlusions   .  Paroxysmal atrial fibrillation (HCC)   . Pneumonia    several times with most recent within 5 yrs ago  . Restless leg syndrome   . Rupture of left quadriceps tendon   . Shortness of breath dyspnea    with exertion  . Sleep apnea    does not use cpap  . Urinary urgency     Past Surgical History:  Procedure Laterality Date  . BICEPS TENDON REPAIR     LEFT  . CARDIOVERSION N/A 02/06/2017   Procedure: CARDIOVERSION;  Surgeon: Jerline Pain, MD;  Location[facility] ENDOSCOPY;  Service: Cardiovascular;  Laterality: N/A;  . CARPAL TUNNEL RELEASE Right 2014  . CHOLECYSTECTOMY  July 16,2010  . COLONOSCOPY    . CYSTOSCOPY    . ESOPHAGOGASTRODUODENOSCOPY    . FEMORAL-POPLITEAL BYPASS GRAFT Right  09/19/2015   Procedure: RIGHT COMMON FEMORAL-ABOVE KNEE POPLITEAL ARTERY    BYPASS GRAFT ;  Surgeon: Conrad Gaylord, MD;  Location: [place];  Service: Vascular;  Laterality: Right;  . FOOT SURGERY Right    x 5t  . HAND SURGERY Right   . HERNIA REPAIR Bilateral    inguinal  . PERIPHERAL VASCULAR CATHETERIZATION N/A 08/02/2015   Procedure: Abdominal Aortogram w/Lower Extremity;  Surgeon: Conrad [place], MD;  Location: [place] CV LAB;  Service: Cardiovascular;  Laterality: N/A;  . QUADRICEPS TENDON REPAIR Left 08/22/2016   Procedure: LEFT OPEN QUADRICEP TENDON REPAIR;  Surgeon: Netta Cedars, MD;  Location: [place];  Service: Orthopedics;  Laterality: Left;  . REVERSE SHOULDER ARTHROPLASTY Right 07/07/2014   Procedure: RIGHT REVERSE TOTAL SHOULDER ;  Surgeon: Netta Cedars, MD;  Location: [place];  Service: Orthopedics;  Laterality: Right;  . REVERSE TOTAL SHOULDER ARTHROPLASTY Right 07/07/2014   Dr Veverly Fells  . ROTATOR CUFF REPAIR     Bilateral   . TEE WITHOUT CARDIOVERSION N/A 02/06/2017   Procedure: TRANSESOPHAGEAL ECHOCARDIOGRAM (TEE);  Surgeon: Jerline Pain, MD;  Location: [facility] ENDOSCOPY;  Service: Cardiovascular;  Laterality: N/A;  . TONSILLECTOMY    . tumor removed from bladder    . VEIN HARVEST Right 09/19/2015   Procedure: WITH NON REVERSE RIGHT GREATER SAPHENOUS VEIN HARVEST;  Surgeon: Conrad Worthington, MD;  Location: [place];  Service: Vascular;  Laterality: Right;     reports that he quit smoking about 5 months ago. His smoking use included cigars. He has a 45.00 pack-year smoking history. He has never used smokeless tobacco. He reports current alcohol use. He reports previous drug use. Drug: Marijuana.  Allergies  Allergen Reactions  . Adhesive [Tape] Other (See Comments)    SKIN IS VERY THIN AND IT TEARS VERY EASILY!!! Please use an alternative  . Aspartame And Phenylalanine Nausea Only  . Other Rash    Pt reports rash on back, buttocks, shoulders and stomach after arteriogram  . Sulfonamide  Derivatives Nausea And Vomiting    Family History  Problem Relation Age of Onset  . Parkinson's disease Mother   . AAA (abdominal aortic aneurysm) Father   . Heart defect Sister        Congenital Heart Disease  . Heart disease Sister      Prior to Admission medications   Medication Sig Start Date End Date Taking? Authorizing Provider  acetaminophen (TYLENOL) 500 MG tablet Take 1,000 mg by mouth every 6 (six) hours as needed (for pain or headaches).     [provider]  albuterol (PROVENTIL HFA;VENTOLIN HFA) 108 (90 BASE) MCG/ACT inhaler Inhale 1-2 puffs into the  lungs every 6 (six) hours as needed for wheezing or shortness of breath. Reported on 10/03/2015    [provider]  amLODipine (NORVASC) 10 MG tablet Take 1 tablet (10 mg total) by mouth daily. 03/14/19   Roxan Hockey, MD  apixaban (ELIQUIS) 5 MG TABS tablet Take 1 tablet (5 mg total) 2 (two) times daily by mouth. 02/08/17   Cristal Ford, DO  budesonide-formoterol (SYMBICORT) 160-4.5 MCG/ACT inhaler Inhale 2 puffs into the lungs 2 (two) times daily. 01/04/18   Lavina Hamman, MD  diphenhydrAMINE (BENADRYL) 25 MG tablet Take 25 mg by mouth every 6 (six) hours as needed for itching or allergies.     [provider]  docusate sodium (COLACE) 100 MG capsule Take 1 capsule (100 mg total) by mouth 2 (two) times daily. Patient taking differently: Take 100 mg by mouth as needed.  01/04/18   Lavina Hamman, MD  famotidine (PEPCID) 20 MG tablet TAKE (1) TABLET BY MOUTH AT BEDTIME. Patient taking differently: Take 20 mg by mouth at bedtime. TAKE (1) TABLET BY MOUTH AT BEDTIME. 04/19/19   Tanda Rockers, MD  furosemide (LASIX) 40 MG tablet Take 1 tablet (40 mg total) by mouth daily with breakfast. 03/14/19 03/13/20  Roxan Hockey, MD  hydrOXYzine (ATARAX/VISTARIL) 25 MG tablet Take 1 tablet (25 mg total) by mouth at bedtime. 03/14/19   Emokpae, Courage, MD  ipratropium-albuterol (DUONEB) 0.5-2.5 (3)  MG/3ML SOLN Take 3 mLs by nebulization every 4 (four) hours as needed. 03/14/19   Roxan Hockey, MD  metoprolol tartrate (LOPRESSOR) 25 MG tablet Take 0.5 tablets (12.5 mg total) by mouth 2 (two) times daily. 03/14/19   Emokpae, Courage, MD  montelukast (SINGULAIR) 10 MG tablet TAKE (1) TABLET BY MOUTH AT BEDTIME. Patient taking differently: Take 10 mg by mouth at bedtime.  08/12/18   Tanda Rockers, MD  pantoprazole (PROTONIX) 40 MG tablet TAKE 1 TABLET BY MOUTH 30 TO 60 MINUTES PRIOR TO THE FIRST MEAL OF THE DAY. Patient taking differently: Take 40 mg by mouth daily.  09/15/18   Tanda Rockers, MD  Tamsulosin HCl (FLOMAX) 0.4 MG CAPS Take 0.4 mg by mouth daily after supper.     [provider]    Physical Exam: Vitals:   06/14/19 1141 06/14/19 1200 06/14/19 1223 06/14/19 1230  BP:  128/82 120/67 (!) 142/97  Pulse:  97 78   Resp:  (!) 25 (!) 25 (!) 21  Temp:      TempSrc:      SpO2:  91% 100%   Weight: 115.7 kg     Height: 5\' 11"  (1.803 m)      Constitutional: Chronically ill-appearing male, pale, awake and alert, NAD, calm, comfortable Eyes: PERRL, lids and conjunctivae normal ENMT: Mucous membranes are moist. Posterior pharynx clear of any exudate or lesions.  poor dentition.  Neck: normal, supple, no masses, no thyromegaly Respiratory: Bilateral inspiratory and expiratory wheezes heard, no crackles.  Mild tachypnea. No accessory muscle use.  Cardiovascular:  normal S1-S2 sounds, no murmurs / rubs / gallops.  2+ pitting pretibial lower extremity edema that extends proximally up to the mid thighs. 2+ pedal pulses. No carotid bruits.  Abdomen: no tenderness, no masses palpated. No hepatosplenomegaly. Bowel sounds positive.  Musculoskeletal: no clubbing / cyanosis. No joint deformity upper and lower extremities. Good ROM, no contractures. Normal muscle tone.  Skin: no rashes, lesions, ulcers. No induration Neurologic: CN 2-12 grossly intact. Sensation intact, DTR normal.  Strength 5/5 in all 4.  Psychiatric: Normal judgment and insight. Alert and oriented x 3. Normal mood.   Labs on Admission: I have personally reviewed following labs and imaging studies  CBC: Recent Labs  Lab 06/14/19 1216  WBC 12.5*  NEUTROABS 7.4  HGB 9.3*  HCT 34.2*  MCV 74.0*  PLT [id number]*   Basic Metabolic Panel: Recent Labs  Lab 06/14/19 1216  NA 140  K 3.8  CL 103  CO2 29  GLUCOSE 129*  BUN 13  CREATININE 0.98  CALCIUM 8.9   GFR: Estimated Creatinine Clearance: 79.1 mL/min (by C-G formula based on SCr of 0.98 mg/dL). Liver Function Tests: No results for input(s): AST, ALT, ALKPHOS, BILITOT, PROT, ALBUMIN in the last 168 hours. No results for input(s): LIPASE, AMYLASE in the last 168 hours. No results for input(s): AMMONIA in the last 168 hours. Coagulation Profile: No results for input(s): INR, PROTIME in the last 168 hours. Cardiac Enzymes: No results for input(s): CKTOTAL, CKMB, CKMBINDEX, TROPONINI in the last 168 hours. BNP (last 3 results) No results for input(s): PROBNP in the last 8760 hours. HbA1C: No results for input(s): HGBA1C in the last 72 hours. CBG: No results for input(s): GLUCAP in the last 168 hours. Lipid Profile: No results for input(s): CHOL, HDL, LDLCALC, TRIG, CHOLHDL, LDLDIRECT in the last 72 hours. Thyroid Function Tests: No results for input(s): TSH, T4TOTAL, FREET4, T3FREE, THYROIDAB in the last 72 hours. Anemia Panel: No results for input(s): VITAMINB12, FOLATE, FERRITIN, TIBC, IRON, RETICCTPCT in the last 72 hours. Urine analysis:    Component Value Date/Time   COLORURINE YELLOW 09/14/2015 [place] 09/14/2015 0934   LABSPEC 1.028 09/14/2015 0934   PHURINE 5.0 09/14/2015 0934   GLUCOSEU NEGATIVE 09/14/2015 0934   HGBUR NEGATIVE 09/14/2015 0934   BILIRUBINUR NEGATIVE 09/14/2015 0934   KETONESUR NEGATIVE 09/14/2015 0934   PROTEINUR NEGATIVE 09/14/2015 0934   UROBILINOGEN 0.2 10/06/2008 0800   NITRITE  NEGATIVE 09/14/2015 0934   LEUKOCYTESUR NEGATIVE 09/14/2015 0934    Radiological Exams on Admission: DG Chest Port 1 View  Result Date: 06/14/2019 CLINICAL DATA:  Shortness of breath EXAM: PORTABLE CHEST 1 VIEW COMPARISON:  March 11, 2019 FINDINGS: Lungs are clear. Heart size and pulmonary vascularity are normal. No adenopathy. There is a total shoulder replacement on the right. Postoperative change noted in the left humeral head region. IMPRESSION: Lungs clear.  Cardiac silhouette within normal limits. Electronically Signed   By: Lowella Grip III M.D.   On: 06/14/2019 12:26   EKG: Independently reviewed. NSR  Assessment/Plan Principal Problem:   COPD with acute exacerbation (HCC) Active Problems:   Essential hypertension   DOE (dyspnea on exertion)   COPD GOLD II    PAF (paroxysmal atrial fibrillation) (HCC)   Acute respiratory failure with hypoxemia (HCC)   Enlarged prostate   GERD (gastroesophageal reflux disease)   Lower leg edema   Leukocytosis    1. COPD with acute exacerbation -patient has gold 3 COPD from long history of tobacco abuse.  He has multiple admissions and hospitalizations for COPD exacerbations.  He is currently experiencing 1 at this time.  I have ordered to continue IV Solu-Medrol added doxycycline 100 mg twice daily added duo nebs every 4 hours and supportive therapy. 2. Mild acute diastolic CHF exacerbation-patient has had progressive weight gain and lower extremity edema over the past several months and not having a good diuretic response to oral Lasix.  I have started him on IV Lasix 40 mg every 12 hours, monitor electrolytes  closely and monitor diuretic response.  Foley catheter placed as patient has a difficult time when he is being diuresed on IV Lasix.  In addition, monitor daily weights intake and output closely.  Follow BNP. 3. Progressive BLE edema - Pt reports increasing edema in legs over last several months.  I recommend discontinuing amlodipine  10 mg daily as this medication can cause severe LE edema in some patients.  I would not restart this medication at discharge.   4. GERD-he has severe symptoms and takes pepcid and pantoprazole at home for GI protection. 5. Essential hypertension-resume home medications except amlodipine follow blood pressures closely and hopefully will improve with diuresis. 6. Leukocytosis-likely stress reaction versus steroid demargination, no signs of infection found at this time continue to monitor closely. 7. Enlarged prostate with difficulty urinating, Foley ordered.  He is on Flomax. 8. DOE-multifactorial given severe COPD and heart failure.  PT evaluation supplemental oxygen as needed. 9. PAF-resume home medications for rate control and anticoagulation.   DVT prophylaxis: Apixaban Code Status: Full Family Communication: Wife Disposition Plan: He meets inpatient criteria for hospitalization for IV diuresis Consults called:   Admission status: INP   Elizabet Schweppe MD [facility] How to contact the [facility] Attending or Consulting provider [place] or covering provider during after hours [place], for this patient?  1. Check the care team in [facility] and look for a) attending/consulting [facility] provider listed and b) the [facility] team listed 2. Log into [email or web address] and use [place]'s universal password to access. If you do not have the password, please contact the hospital operator. 3. Locate the [facility] provider you are looking for under [facility] and page to a number that you can be directly reached. 4. If you still have difficulty reaching the provider, please page the [facility] (Director on Call) for the Hospitalists listed on amion for assistance.   If 7PM-7AM, please contact night-coverage [email or web address] Password [facility]  06/14/2019, 2:03 PM

## 2019-06-14 NOTE — ED Triage Notes (Signed)
Pt saw his cardiologist today for leg swelling and was sent to ED for eval.  Pt c/o of bilateral leg swelling, cramping  6 months.  Pt also having sob.

## 2019-06-14 NOTE — Patient Instructions (Signed)
Medication Instructions:  Continue all current medications.  Labwork: none  Testing/Procedures: none  Follow-Up: To be determined   Any Other Special Instructions Will Be Listed Below (If Applicable). Going to [facility] ED per Dr. Bronson Ing   If you need a refill on your cardiac medications before your next appointment, please call your pharmacy.

## 2019-06-15 ENCOUNTER — Ambulatory Visit: Payer: PPO | Admitting: Internal Medicine

## 2019-06-15 ENCOUNTER — Telehealth: Payer: Self-pay | Admitting: Internal Medicine

## 2019-06-15 LAB — CBC WITH DIFFERENTIAL/PLATELET
Abs Immature Granulocytes: 0.11 10*3/uL — ABNORMAL HIGH (ref 0.00–0.07)
Basophils Absolute: 0 10*3/uL (ref 0.0–0.1)
Basophils Relative: 0 %
Eosinophils Absolute: 0 10*3/uL (ref 0.0–0.5)
Eosinophils Relative: 0 %
HCT: 34.1 % — ABNORMAL LOW (ref 39.0–52.0)
Hemoglobin: 9.2 g/dL — ABNORMAL LOW (ref 13.0–17.0)
Immature Granulocytes: 1 %
Lymphocytes Relative: 14 %
Lymphs Abs: 1.7 10*3/uL (ref 0.7–4.0)
MCH: 19.7 pg — ABNORMAL LOW (ref 26.0–34.0)
MCHC: 27 g/dL — ABNORMAL LOW (ref 30.0–36.0)
MCV: 72.9 fL — ABNORMAL LOW (ref 80.0–100.0)
Monocytes Absolute: 0.3 10*3/uL (ref 0.1–1.0)
Monocytes Relative: 2 %
Neutro Abs: 10.2 10*3/uL — ABNORMAL HIGH (ref 1.7–7.7)
Neutrophils Relative %: 83 %
Platelets: 610 10*3/uL — ABNORMAL HIGH (ref 150–400)
RBC: 4.68 MIL/uL (ref 4.22–5.81)
RDW: 20.7 % — ABNORMAL HIGH (ref 11.5–15.5)
WBC: 12.3 10*3/uL — ABNORMAL HIGH (ref 4.0–10.5)
nRBC: 0 % (ref 0.0–0.2)

## 2019-06-15 LAB — COMPREHENSIVE METABOLIC PANEL
ALT: 20 U/L (ref 0–44)
AST: 25 U/L (ref 15–41)
Albumin: 3.3 g/dL — ABNORMAL LOW (ref 3.5–5.0)
Alkaline Phosphatase: 84 U/L (ref 38–126)
Anion gap: 11 (ref 5–15)
BUN: 14 mg/dL (ref 8–23)
CO2: 27 mmol/L (ref 22–32)
Calcium: 9.2 mg/dL (ref 8.9–10.3)
Chloride: 102 mmol/L (ref 98–111)
Creatinine, Ser: 0.99 mg/dL (ref 0.61–1.24)
GFR calc Af Amer: >60 mL/min (ref >60)
GFR calc non Af Amer: >60 mL/min (ref >60)
Glucose, Bld: 183 mg/dL — ABNORMAL HIGH (ref 70–99)
Potassium: 4 mmol/L (ref 3.5–5.1)
Sodium: 140 mmol/L (ref 135–145)
Total Bilirubin: 0.5 mg/dL (ref 0.3–1.2)
Total Protein: 6.9 g/dL (ref 6.5–8.1)

## 2019-06-15 LAB — BRAIN NATRIURETIC PEPTIDE: B Natriuretic Peptide: 175 pg/mL — ABNORMAL HIGH (ref 0.0–100.0)

## 2019-06-15 LAB — MAGNESIUM: Magnesium: 2.3 mg/dL (ref 1.7–2.4)

## 2019-06-15 MED ORDER — METHYLPREDNISOLONE SODIUM SUCC 40 MG IJ SOLR
INTRAMUSCULAR | Status: AC
  Administered 2019-06-15: 15:00:00 40 mg via INTRAVENOUS
  Filled 2019-06-15: qty 1

## 2019-06-15 MED ORDER — PREDNISONE 20 MG PO TABS
40.0000 mg | ORAL_TABLET | Freq: Every day | ORAL | Status: DC
  Administered 2019-06-16: 40 mg via ORAL
  Filled 2019-06-15: qty 2

## 2019-06-15 MED ORDER — IPRATROPIUM-ALBUTEROL 0.5-2.5 (3) MG/3ML IN SOLN
3.0000 mL | RESPIRATORY_TRACT | Status: DC | PRN
  Administered 2019-06-15 – 2019-06-19 (×10): 3 mL via RESPIRATORY_TRACT
  Filled 2019-06-15 (×10): qty 3

## 2019-06-15 MED ORDER — MORPHINE SULFATE (PF) 4 MG/ML IV SOLN
4.0000 mg | Freq: Once | INTRAVENOUS | Status: AC
  Administered 2019-06-15: 04:00:00 4 mg via INTRAVENOUS
  Filled 2019-06-15: qty 1

## 2019-06-15 NOTE — Progress Notes (Signed)
PROGRESS NOTE    Derrick Moon  [id number] DOB: Aug 30, 1940 DOA: 06/14/2019 PCP: [name], MD   Brief Narrative:  Per HPI:  Derrick Moon is a 79 y.o. male with diagnosis of Gold III COPD, chronic diastolic CHF, chronic venous stasis edema of BLEs presented to the ED with his wife after they had discussed with cardiology earlier today during an office visit.  The patient had concerns of increasing shortness of breath and lower extremity edema that has progressively gotten worse over the past several months.  He is also had abnormal weight gain over the past several months.  His weight has increased from 195 pounds in November 20 19 to 255 pounds today.  He has shortness of breath with ambulation walking about 10 feet.  He has PND.  He has exertional chest discomfort.  The patient says that he has been taking his Lasix as prescribed but is not diuresing very well.  The patient reports that he is having difficulty sleeping at night due to wheezing coughing and shortness of breath.  His echocardiogram from December 2020 showed normal LV systolic function with indeterminate diastolic function.  The patient is very frustrated that he is having multiple recurrent hospitalizations for the same types of symptoms.  He feels that he gets tuned up in the hospital, discharged and then 2 weeks later he becomes symptomatic again.  3/24: Patient was admitted with concerns for COPD exacerbation along with mild acute on chronic diastolic heart failure exacerbation.  He appears to be diuresing well on Lasix 40 mg IV twice daily.  He does not appear to be short of breath or wheezing or coughing this morning.  We will plan to DC IV steroids and placed on prednisone daily for now and make breathing treatments as needed.  Continue to monitor labs and weights which are still up at 255 pounds.  Assessment & Plan:   Principal Problem:   COPD with acute exacerbation ([place]) Active Problems:   Essential hypertension  DOE (dyspnea on exertion)   COPD GOLD II    PAF (paroxysmal atrial fibrillation) (HCC)   Acute respiratory failure with hypoxemia (HCC)   Enlarged prostate   GERD (gastroesophageal reflux disease)   Lower leg edema   Leukocytosis   Acute COPD exacerbation-improved -Has chronic hypoxemia and wears 2-2.5 L nasal cannula at home -Patient has baseline Gold stage III COPD with long history of prior tobacco abuse -IV Solu-Medrol transition to oral prednisone starting tomorrow -Continue doxycycline twice daily -DuoNebs changed from scheduled to every 4 hours as needed -PT evaluation  Mild acute on chronic diastolic CHF exacerbation -Continue ongoing diuresis with Lasix 40 mg IV twice daily --2.5 L diuresis over the last 24 hours with stable renal function -Follow daily labs  Progressive bilateral lower extremity edema-multifactorial -This is likely related to some volume overload as well as amlodipine use which has been discontinued  GERD -Continue PPI  Essential hypertension-stable -Continue home medications with amlodipine currently discontinued  Leukocytosis-stable -Likely related to steroid demargination/stress reaction -Continue to monitor  Enlarged prostate with dysuria -Continue Flomax and Foley for now -Voiding trial prior to discharge  PAF -Eliquis for anticoagulation -Metoprolol for heart rate control -DuoNebs changed as needed  DVT prophylaxis: Eliquis Code Status: Full Family Communication: Plan to discuss with wife Disposition Plan: Continue IV diuresis and COPD treatments.  Goal weight of approximately 245 pounds.   Consultants:   None  Procedures:   See below  Antimicrobials:  Anti-infectives (From admission, onward)  Start     Dose/Rate Route Frequency Ordered Stop   06/14/19 2200  doxycycline (VIBRA-TABS) tablet 100 mg     100 mg Oral Every 12 hours 06/14/19 1725         Subjective: Patient seen and evaluated today with no new acute  complaints or concerns. No acute concerns or events noted overnight.  Objective: Vitals:   06/15/19 0744 06/15/19 0751 06/15/19 0939 06/15/19 1203  BP:   127/64   Pulse:   85   Resp:      Temp:      TempSrc:      SpO2: 97% 100% 95% 93%  Weight:      Height:        Intake/Output Summary (Last 24 hours) at 06/15/2019 1217 Last data filed at 06/15/2019 0944 Gross per 24 hour  Intake 360 ml  Output 3150 ml  Net -2790 ml   Filed Weights   06/14/19 1141 06/14/19 1710 06/15/19 0500  Weight: 115.7 kg 115.6 kg 115.8 kg    Examination:  General exam: Appears calm and comfortable  Respiratory system: Clear to auscultation. Respiratory effort normal.  Currently on 2 L nasal cannula oxygen. Cardiovascular system: S1 & S2 heard, RRR. No JVD, murmurs, rubs, gallops or clicks. No pedal edema. Gastrointestinal system: Abdomen is nondistended, soft and nontender. No organomegaly or masses felt. Normal bowel sounds heard. Central nervous system: Alert and oriented. No focal neurological deficits. Extremities: Symmetric 5 x 5 power. Skin: No rashes, lesions or ulcers Psychiatry: Judgement and insight appear normal. Mood & affect appropriate.     Data Reviewed: I have personally reviewed following labs and imaging studies  CBC: Recent Labs  Lab 06/14/19 1216 06/15/19 0417  WBC 12.5* 12.3*  NEUTROABS 7.4 10.2*  HGB 9.3* 9.2*  HCT 34.2* 34.1*  MCV 74.0* 72.9*  PLT 584* [id number]*   Basic Metabolic Panel: Recent Labs  Lab 06/14/19 1216 06/15/19 0417  NA 140 140  K 3.8 4.0  CL 103 102  CO2 29 27  GLUCOSE 129* 183*  BUN 13 14  CREATININE 0.98 0.99  CALCIUM 8.9 9.2  MG  --  2.3   GFR: Estimated Creatinine Clearance: 78.3 mL/min (by C-G formula based on SCr of 0.99 mg/dL). Liver Function Tests: Recent Labs  Lab 06/15/19 0417  AST 25  ALT 20  ALKPHOS 84  BILITOT 0.5  PROT 6.9  ALBUMIN 3.3*   No results for input(s): LIPASE, AMYLASE in the last 168 hours. No results for  input(s): AMMONIA in the last 168 hours. Coagulation Profile: No results for input(s): INR, PROTIME in the last 168 hours. Cardiac Enzymes: No results for input(s): CKTOTAL, CKMB, CKMBINDEX, TROPONINI in the last 168 hours. BNP (last 3 results) No results for input(s): PROBNP in the last 8760 hours. HbA1C: No results for input(s): HGBA1C in the last 72 hours. CBG: No results for input(s): GLUCAP in the last 168 hours. Lipid Profile: No results for input(s): CHOL, HDL, LDLCALC, TRIG, CHOLHDL, LDLDIRECT in the last 72 hours. Thyroid Function Tests: No results for input(s): TSH, T4TOTAL, FREET4, T3FREE, THYROIDAB in the last 72 hours. Anemia Panel: No results for input(s): VITAMINB12, FOLATE, FERRITIN, TIBC, IRON, RETICCTPCT in the last 72 hours. Sepsis Labs: No results for input(s): PROCALCITON, LATICACIDVEN in the last 168 hours.  Recent Results (from the past 240 hour(s))  Respiratory Panel by RT PCR (Flu A&B, Covid) - Nasopharyngeal Swab     Status: None   Collection Time: 06/14/19 12:03 PM  Specimen: Nasopharyngeal Swab  Result Value Ref Range Status   SARS Coronavirus 2 by RT PCR NEGATIVE NEGATIVE Final    Comment: (NOTE) SARS-CoV-2 target nucleic acids are NOT DETECTED. The SARS-CoV-2 RNA is generally detectable in upper respiratoy specimens during the acute phase of infection. The lowest concentration of SARS-CoV-2 viral copies this assay can detect is 131 copies/mL. A negative result does not preclude SARS-Cov-2 infection and should not be used as the sole basis for treatment or other patient management decisions. A negative result may occur with  improper specimen collection/handling, submission of specimen other than nasopharyngeal swab, presence of viral mutation(s) within the areas targeted by this assay, and inadequate number of viral copies (<131 copies/mL). A negative result must be combined with clinical observations, patient history, and epidemiological  information. The expected result is Negative. Fact Sheet for Patients:  PinkCheek.be Fact Sheet for Healthcare Providers:  GravelBags.it This test is not yet ap proved or cleared by the [place] FDA and  has been authorized for detection and/or diagnosis of SARS-CoV-2 by FDA under an Emergency Use Authorization (EUA). This EUA will remain  in effect (meaning this test can be used) for the duration of the COVID-19 declaration under Section 564(b)(1) of the Act, 21 U.S.C. section 360bbb-3(b)(1), unless the authorization is terminated or revoked sooner.    Influenza A by PCR NEGATIVE NEGATIVE Final   Influenza B by PCR NEGATIVE NEGATIVE Final    Comment: (NOTE) The Xpert Xpress SARS-CoV-2/FLU/RSV assay is intended as an aid in  the diagnosis of influenza from Nasopharyngeal swab specimens and  should not be used as a sole basis for treatment. Nasal washings and  aspirates are unacceptable for Xpert Xpress SARS-CoV-2/FLU/RSV  testing. Fact Sheet for Patients: PinkCheek.be Fact Sheet for Healthcare Providers: GravelBags.it This test is not yet approved or cleared by the [place] FDA and  has been authorized for detection and/or diagnosis of SARS-CoV-2 by  FDA under an Emergency Use Authorization (EUA). This EUA will remain  in effect (meaning this test can be used) for the duration of the  Covid-19 declaration under Section 564(b)(1) of the Act, 21  U.S.C. section 360bbb-3(b)(1), unless the authorization is  terminated or revoked. Performed at [facility], [address], [place], [place] [address]          Radiology Studies: [facility] 1 View  Result Date: 06/14/2019 CLINICAL DATA:  Shortness of breath EXAM: PORTABLE CHEST 1 VIEW COMPARISON:  March 11, 2019 FINDINGS: Lungs are clear. Heart size and pulmonary vascularity are normal. No adenopathy.  There is a total shoulder replacement on the right. Postoperative change noted in the left humeral head region. IMPRESSION: Lungs clear.  Cardiac silhouette within normal limits. Electronically Signed   By: Lowella Grip III M.D.   On: 06/14/2019 12:26        Scheduled Meds: . apixaban  5 mg Oral BID  . doxycycline  100 mg Oral Q12H  . famotidine  20 mg Oral QHS  . furosemide  40 mg Intravenous Q12H  . guaiFENesin  1,200 mg Oral BID  . hydrOXYzine  25 mg Oral QHS  . ipratropium-albuterol  3 mL Nebulization Q4H WA  . methylPREDNISolone (SOLU-MEDROL) injection  40 mg Intravenous Q12H  . metoprolol tartrate  12.5 mg Oral BID  . mometasone-formoterol  2 puff Inhalation BID  . montelukast  10 mg Oral QHS  . pantoprazole  40 mg Oral Daily  . potassium chloride  30 mEq Oral BID  .  sodium chloride flush  3 mL Intravenous Q12H  . tamsulosin  0.4 mg Oral QPC supper  . vitamin B-12  500 mcg Oral Daily   Continuous Infusions: . sodium chloride       LOS: 1 day    Time spent: 35 minutes    Derrick Face Darleen Crocker, DO [facility]  If 7PM-7AM, please contact night-coverage [email or web address] 06/15/2019, 12:17 PM

## 2019-06-15 NOTE — Evaluation (Signed)
Physical Therapy Evaluation Patient Details Name: Derrick Moon MRN: [id number] DOB: 03/13/1941 Today's Date: 06/15/2019   History of Present Illness  Derrick Moon is a 79 y.o. male with diagnosis of Gold III COPD, chronic diastolic CHF, chronic venous stasis edema of BLEs presented to the ED with his wife after they had discussed with cardiology earlier today during an office visit.  The patient had concerns of increasing shortness of breath and lower extremity edema that has progressively gotten worse over the past several months.  He is also had abnormal weight gain over the past several months.  His weight has increased from 195 pounds in November 20 19 to 255 pounds today.  He has shortness of breath with ambulation walking about 10 feet.  He has PND.  He has exertional chest discomfort.  The patient says that he has been taking his Lasix as prescribed but is not diuresing very well.  The patient reports that he is having difficulty sleeping at night due to wheezing coughing and shortness of breath.  His echocardiogram from December 2020 showed normal LV systolic function with indeterminate diastolic function.  The patient is very frustrated that he is having multiple recurrent hospitalizations for the same types of symptoms.  He feels that he gets tuned up in the hospital, discharged and then 2 weeks later he becomes symptomatic again.    Clinical Impression  Patient demonstrates labored movement for sitting up at bedside with head of bed raised, poor tolerance for lying flat, able to ambulate in room and hallway without loss of balance without use of AD, limited mostly due to c/o severe cramping pain in legs, on 3 LPM with SpO2 maintaining at 93-97%.  Patient briefly sat in chair but had to go back to bed due to BLE pain - RN notified.  Patient will benefit from continued physical therapy in hospital and recommended venue below to increase strength, balance, endurance for safe ADLs and gait.     Follow Up Recommendations Home health PT;Supervision for mobility/OOB;Supervision - Intermittent    Equipment Recommendations  3in1 (PT)(Bed side commode)    Recommendations for Other Services       Precautions / Restrictions Precautions Precautions: Fall Restrictions Weight Bearing Restrictions: No      Mobility  Bed Mobility Overal bed mobility: Needs Assistance Bed Mobility: Supine to Sit;Sit to Supine     Supine to sit: Min guard;HOB elevated Sit to supine: Min guard;HOB elevated   General bed mobility comments: increased time labored movement  Transfers Overall transfer level: Needs assistance Equipment used: None Transfers: Sit to/from [facility] Sit to Stand: Supervision;Min guard Stand pivot transfers: Supervision;Min guard       General transfer comment: slightly labored movement, increased time  Ambulation/Gait Ambulation/Gait assistance: Supervision;Min guard Gait Distance (Feet): 50 Feet Assistive device: None Gait Pattern/deviations: Decreased step length - right;Decreased step length - left;Decreased stride length Gait velocity: decreased   General Gait Details: slightly labored cadence without loss of balance, limited mostly due to c/o severe pain due to cramping in legs  Stairs            Wheelchair Mobility    Modified Rankin (Stroke Patients Only)       Balance Overall balance assessment: Mild deficits observed, not formally tested  Pertinent Vitals/Pain Pain Assessment: Faces Faces Pain Scale: Hurts whole lot Pain Location: BLE after walking Pain Descriptors / Indicators: Cramping;Tightness;Grimacing Pain Intervention(s): Limited activity within patient's tolerance;Monitored during session;Repositioned;Premedicated before session    Home Living Family/patient expects to be discharged to:: Private residence Living Arrangements: Spouse/significant  other Available Help at Discharge: Family;Available 24 hours/day Type of Home: House Home Access: Stairs to enter Entrance Stairs-Rails: Right;Left;Can reach both Entrance Stairs-Number of Steps: 3 Home Layout: One level Home Equipment: Walker - 2 wheels;Shower seat;Wheelchair - manual;Cane - single point      Prior Function Level of Independence: Independent         Comments: household and short distanced Hydrographic surveyor, works on his farm     Journalist, newspaper   Dominant Hand: Right    Extremity/Trunk Assessment   Upper Extremity Assessment Upper Extremity Assessment: Overall WFL for tasks assessed    Lower Extremity Assessment Lower Extremity Assessment: Generalized weakness    Cervical / Trunk Assessment Cervical / Trunk Assessment: Normal  Communication   Communication: No difficulties  Cognition Arousal/Alertness: Awake/alert Behavior During Therapy: WFL for tasks assessed/performed Overall Cognitive Status: Within Functional Limits for tasks assessed                                        General Comments      Exercises     Assessment/Plan    PT Assessment Patient needs continued PT services  PT Problem List Decreased strength;Decreased activity tolerance;Decreased balance;Decreased mobility       PT Treatment Interventions Balance training;Gait training;Stair training;Functional mobility training;Therapeutic activities;Therapeutic exercise;Patient/family education    PT Goals (Current goals can be found in the Care Plan section)  Acute Rehab PT Goals Patient Stated Goal: return home with spouse to assist PT Goal Formulation: With patient/family Time For Goal Achievement: 06/22/19 Potential to Achieve Goals: Good    Frequency Min 3X/week   Barriers to discharge        Co-evaluation               AM-PAC PT "6 Clicks" Mobility  Outcome Measure Help needed turning from your back to your side while in a flat bed  without using bedrails?: None Help needed moving from lying on your back to sitting on the side of a flat bed without using bedrails?: A Little Help needed moving to and from a bed to a chair (including a wheelchair)?: A Little Help needed standing up from a chair using your arms (e.g., wheelchair or bedside chair)?: A Little Help needed to walk in hospital room?: A Little Help needed climbing 3-5 steps with a railing? : A Lot 6 Click Score: 18    End of Session Equipment Utilized During Treatment: Oxygen Activity Tolerance: Patient tolerated treatment well;Patient limited by fatigue Patient left: in bed;with call bell/phone within reach;with family/visitor present Nurse Communication: Mobility status PT Visit Diagnosis: Unsteadiness on feet (R26.81);Other abnormalities of gait and mobility (R26.89);Muscle weakness (generalized) (M62.81)    Time: [id number] PT Time Calculation (min) (ACUTE ONLY): 32 min   Charges:   PT Evaluation $PT Eval Moderate Complexity: 1 Mod PT Treatments $Therapeutic Activity: 23-37 mins        4:14 PM, 06/15/19 Lonell Grandchild, MPT Physical Therapist with [facility] [phone] office [phone] mobile phone

## 2019-06-15 NOTE — Telephone Encounter (Signed)
I called [name] and there was no answer- had to [facility]

## 2019-06-15 NOTE — Patient Outreach (Signed)
Received Referral for IP Discharge, sent to [place] for processing.Marland KitchenMarland KitchenLMComer

## 2019-06-15 NOTE — Progress Notes (Signed)
TRH night shift.  The patient has been complaining of insomnia due to bilateral feet/lower extremities pain.  He normally takes acetaminophen at home, which usually relieves the pain.  He had acetaminophen earlier in the shift without significant results. He also received hydroxyzine at bedtime. He has being admitted for acute dyspnea secondary to COPD exacerbation and mild diastolic CHF is observation.    I will try morphine 4 mg IVP x1 dose.  Tennis Must, MD.

## 2019-06-15 NOTE — TOC Initial Note (Signed)
Transition of Care [facility]) - Initial/Assessment Note    Patient Details  Name: Derrick Moon MRN: [id number] Date of Birth: 1940/08/03  [facility]) CM/SW Contact:    Shade Flood, LCSW Phone Number: 06/15/2019, 2:58 PM  Clinical Narrative:                  Pt admitted from home. He lives with his wife. Spoke with pt's wife in pt's room today to assess for CHF needs at home. Pt's wife states that pt has a scale at home and weighs frequently. She states that he has a PCP and is able to obtain medications and get to appointments with no difficulty. Pt's wife states that pt has mostly had to use a wheelchair the last month due to swelling and pain in his legs. She states he watches his diet and that the doctors have not been able to determine why he is experiencing these symptoms.  Discussed HH referral for pt at dc and pt and his wife are agreeable. Will refer to an agency that will accept pt's insurance. Also referred to [facility] [employer] for further assistance after dc.  TOC will follow and continue to assist with dc planning needs.  Expected Discharge Plan: [place] Barriers to Discharge: Continued Medical Work up   Patient Goals and CMS Choice        Expected Discharge Plan and Services Expected Discharge Plan: [place] In-house Referral: Clinical Social Work   Post Acute Care Choice: [place] arrangements for the past 2 months: [place]                                      Prior Living Arrangements/Services Living arrangements for the past 2 months: Single Family Home Lives with:: Spouse Patient language and need for interpreter reviewed:: Yes Do you feel safe going back to the place where you live?: Yes      Need for Family Participation in Patient Care: Yes (Comment) Care giver support system in place?: Yes (comment) Current home services: DME Criminal Activity/Legal Involvement  Pertinent to Current Situation/Hospitalization: No - Comment as needed  Activities of Daily Living      Permission Sought/Granted                  Emotional Assessment       Orientation: : Oriented to Self, Oriented to Place, Oriented to  Time, Oriented to Situation Alcohol / Substance Use: Not Applicable Psych Involvement: No (comment)  Admission diagnosis:  COPD with acute exacerbation ([place]) [J44.1] Patient Active Problem List   Diagnosis Date Noted  . Lower leg edema 06/14/2019  . Leukocytosis 06/14/2019  . Enlarged prostate   . GERD (gastroesophageal reflux disease)   . B12 deficiency 02/10/2019  . IDA (iron deficiency anemia) 02/08/2019  . Abdominal distention 02/08/2019  . Tobacco use 01/01/2018  . COPD exacerbation ([place]) 12/31/2017  . Otitis media 12/31/2017  . Bronchiectasis without complication ([place]) [id number]  . Acute maxillary sinusitis 08/20/2017  . Cough variant asthma vs uacs  07/17/2017  . Abnormal thyroid blood test 05/07/2017  . Pulmonary infiltrates 02/20/2017  . PAF (paroxysmal atrial fibrillation) ([place]) 02/02/2017  . COPD with acute exacerbation ([place]) 02/02/2017  . Acute respiratory failure with hypoxemia ([place]) 02/02/2017  . Atrial fibrillation with RVR ([place]) 02/02/2017  . Acute on chronic respiratory failure (  [place]) 02/02/2017  . SIRS (systemic inflammatory response syndrome) ([place]) 02/02/2017  . Cigarette smoker 12/14/2016  . DOE (dyspnea on exertion) 12/12/2015  . COPD GOLD II  12/12/2015  . Atherosclerosis of native arteries of right leg with ulceration of other part of foot ([place]) 09/19/2015  . Atherosclerosis of native arteries of the extremities with ulceration ([place]) 07/27/2015  . S/P shoulder replacement 07/07/2014  . Aortic ectasia, abdominal ([place]) 02/26/2012  . Essential hypertension 02/26/2009  . DIVERTICULITIS, HX OF 02/26/2009   PCP:  [name], MD Pharmacy:   [place] [place] [id number] [place] [id number] Phone: [phone] Fax: [phone]     Social Determinants of Health (SDOH) Interventions    Readmission Risk Interventions Readmission Risk Prevention Plan 06/15/2019  Transportation Screening Complete  Home Care Screening Complete  Medication Review (RN CM) Complete  Some recent data might be hidden

## 2019-06-15 NOTE — Plan of Care (Signed)
  Problem: Acute Rehab PT Goals(only PT should resolve) Goal: Pt Will Go Supine/Side To Sit Outcome: Progressing Flowsheets (Taken 06/15/2019 1616) Pt will go Supine/Side to Sit:  with supervision  with modified independence Goal: Patient Will Transfer Sit To/From Stand Outcome: Progressing Flowsheets (Taken 06/15/2019 1616) Patient will transfer sit to/from stand: with supervision Goal: Pt Will Transfer Bed To Chair/Chair To Bed Outcome: Progressing Flowsheets (Taken 06/15/2019 1616) Pt will Transfer Bed to Chair/Chair to Bed: with supervision Goal: Pt Will Ambulate Outcome: Progressing Flowsheets (Taken 06/15/2019 1616) Pt will Ambulate:  75 feet  with supervision  with cane   4:16 PM, 06/15/19 Lonell Grandchild, MPT Physical Therapist with [facility] 336 [phone] office [phone] mobile phone

## 2019-06-16 LAB — COMPREHENSIVE METABOLIC PANEL
ALT: 23 U/L (ref 0–44)
AST: 27 U/L (ref 15–41)
Albumin: 3.3 g/dL — ABNORMAL LOW (ref 3.5–5.0)
Alkaline Phosphatase: 74 U/L (ref 38–126)
Anion gap: 10 (ref 5–15)
BUN: 24 mg/dL — ABNORMAL HIGH (ref 8–23)
CO2: 28 mmol/L (ref 22–32)
Calcium: 8.9 mg/dL (ref 8.9–10.3)
Chloride: 104 mmol/L (ref 98–111)
Creatinine, Ser: 1.02 mg/dL (ref 0.61–1.24)
GFR calc Af Amer: >60 mL/min (ref >60)
GFR calc non Af Amer: >60 mL/min (ref >60)
Glucose, Bld: 182 mg/dL — ABNORMAL HIGH (ref 70–99)
Potassium: 4 mmol/L (ref 3.5–5.1)
Sodium: 142 mmol/L (ref 135–145)
Total Bilirubin: 0.3 mg/dL (ref 0.3–1.2)
Total Protein: 6.6 g/dL (ref 6.5–8.1)

## 2019-06-16 LAB — MAGNESIUM: Magnesium: 2.4 mg/dL (ref 1.7–2.4)

## 2019-06-16 LAB — CBC WITH DIFFERENTIAL/PLATELET
Abs Immature Granulocytes: 0.22 10*3/uL — ABNORMAL HIGH (ref 0.00–0.07)
Basophils Absolute: 0 10*3/uL (ref 0.0–0.1)
Basophils Relative: 0 %
Eosinophils Absolute: 0 10*3/uL (ref 0.0–0.5)
Eosinophils Relative: 0 %
HCT: 33.9 % — ABNORMAL LOW (ref 39.0–52.0)
Hemoglobin: 9.2 g/dL — ABNORMAL LOW (ref 13.0–17.0)
Immature Granulocytes: 1 %
Lymphocytes Relative: 15 %
Lymphs Abs: 3.1 10*3/uL (ref 0.7–4.0)
MCH: 20 pg — ABNORMAL LOW (ref 26.0–34.0)
MCHC: 27.1 g/dL — ABNORMAL LOW (ref 30.0–36.0)
MCV: 73.5 fL — ABNORMAL LOW (ref 80.0–100.0)
Monocytes Absolute: 2 10*3/uL — ABNORMAL HIGH (ref 0.1–1.0)
Monocytes Relative: 9 %
Neutro Abs: 15.7 10*3/uL — ABNORMAL HIGH (ref 1.7–7.7)
Neutrophils Relative %: 75 %
Platelets: 600 10*3/uL — ABNORMAL HIGH (ref 150–400)
RBC: 4.61 MIL/uL (ref 4.22–5.81)
RDW: 20.7 % — ABNORMAL HIGH (ref 11.5–15.5)
WBC: 21 10*3/uL — ABNORMAL HIGH (ref 4.0–10.5)
nRBC: 0.1 % (ref 0.0–0.2)

## 2019-06-16 LAB — BRAIN NATRIURETIC PEPTIDE: B Natriuretic Peptide: 159 pg/mL — ABNORMAL HIGH (ref 0.0–100.0)

## 2019-06-16 MED ORDER — FUROSEMIDE 10 MG/ML IJ SOLN
60.0000 mg | Freq: Two times a day (BID) | INTRAMUSCULAR | Status: DC
  Administered 2019-06-16 – 2019-06-18 (×4): 60 mg via INTRAVENOUS
  Filled 2019-06-16 (×4): qty 6

## 2019-06-16 NOTE — Progress Notes (Signed)
PROGRESS NOTE    Derrick Moon  [id number] DOB: [birth year] DOA: 06/14/2019 PCP: [name], MD   Brief Narrative:  Per HPI: Derrick Moon Brinsonis a 79 y.o.malewithdiagnosis of Gold III COPD, chronic diastolic CHF, chronic venous stasis edema of BLEspresented to the ED with his wife after they had discussed with cardiology earlier today during an office visit. The patient had concerns of increasing shortness of breath and lower extremity edema that has progressively gotten worse over the past several months. He is also had abnormal weight gain over the past several months. His weight has increased from 195 pounds in November 20 19 to 255 pounds today. He has shortness of breath with ambulation walking about 10 feet. He has PND. He has exertional chest discomfort. The patient says that he has been taking his Lasix as prescribed but is not diuresing very well. The patient reports that he is having difficulty sleeping at night due to wheezing coughing and shortness of breath. His echocardiogram from December 2020 showed normal LV systolic function with indeterminate diastolic function. The patient is very frustrated that he is having multiple recurrenthospitalizations for the same types of symptoms. He feels that he gets tuned up in the hospital, discharged and then 2 weeks later he becomes symptomatic again.  3/24: Patient was admitted with concerns for COPD exacerbation along with mild acute on chronic diastolic heart failure exacerbation.  He appears to be diuresing well on Lasix 40 mg IV twice daily.  He does not appear to be short of breath or wheezing or coughing this morning.  We will plan to DC IV steroids and placed on prednisone daily for now and make breathing treatments as needed.  Continue to monitor labs and weights which are still up at 255 pounds.  3/25: Patient still has a weight of approximately 350 pounds and has diuresed approximately 1300 mL in the last 24 hours.   Plan to increase Lasix to 60 mg IV twice daily and continue to monitor I's and O's.  Assessment & Plan:   Principal Problem:   COPD with acute exacerbation ([place]) Active Problems:   Essential hypertension   DOE (dyspnea on exertion)   COPD GOLD II    PAF (paroxysmal atrial fibrillation) (HCC)   Acute respiratory failure with hypoxemia (HCC)   Enlarged prostate   GERD (gastroesophageal reflux disease)   Lower leg edema   Leukocytosis   Acute COPD exacerbation-improved -Has chronic hypoxemia and wears 2-2.5 L nasal cannula at home -Patient has baseline Gold stage III COPD with long history of prior tobacco abuse -DC further steroids -Continue doxycycline twice daily -DuoNebs changed from scheduled to every 4 hours as needed -PT evaluation  Mild acute on chronic diastolic CHF exacerbation -Continue ongoing diuresis with Lasix now increased to 60 mg IV twice daily --1.3 L diuresis over the last 24 hours with stable renal function -Follow daily labs  Progressive bilateral lower extremity edema-multifactorial -This is likely related to some volume overload as well as amlodipine use which has been discontinued  GERD -Continue PPI  Essential hypertension-stable -Continue home medications with amlodipine currently discontinued  Leukocytosis-stable -Likely related to steroid demargination/stress reaction -No need to continue monitoring  Enlarged prostate with dysuria -Continue Flomax and Foley for now -Voiding trial prior to discharge  PAF -Eliquis for anticoagulation -Metoprolol for heart rate control -DuoNebs changed as needed  DVT prophylaxis: Eliquis Code Status: Full Family Communication:  Discussed with wife 3/24 Disposition Plan: Continue IV diuresis and COPD treatments.  Goal  weight of approximately 245 pounds.   Consultants:   None  Procedures:   See below  Antimicrobials:  Anti-infectives (From admission, onward)   Start     Dose/Rate  Route Frequency Ordered Stop   06/14/19 2200  doxycycline (VIBRA-TABS) tablet 100 mg     100 mg Oral Every 12 hours 06/14/19 1725         Subjective: Patient seen and evaluated today with no new acute complaints or concerns. No acute concerns or events noted overnight.  Objective: Vitals:   06/16/19 0500 06/16/19 0552 06/16/19 0758 06/16/19 0808  BP:  (!) 156/79    Pulse:  81    Resp:  18    Temp:  98.5 F (36.9 C)    TempSrc:  Oral    SpO2:  98% 97% 99%  Weight: 115.6 kg     Height:        Intake/Output Summary (Last 24 hours) at 06/16/2019 1340 Last data filed at 06/16/2019 1300 Gross per 24 hour  Intake 920 ml  Output 3000 ml  Net -2080 ml   Filed Weights   06/14/19 1710 06/15/19 0500 06/16/19 0500  Weight: 115.6 kg 115.8 kg 115.6 kg    Examination:  General exam: Appears calm and comfortable  Respiratory system: Clear to auscultation. Respiratory effort normal.  Currently on 3 L nasal cannula oxygen. Cardiovascular system: S1 & S2 heard, RRR. No JVD, murmurs, rubs, gallops or clicks. No pedal edema. Gastrointestinal system: Abdomen is nondistended, soft and nontender. No organomegaly or masses felt. Normal bowel sounds heard. Central nervous system: Alert and oriented. No focal neurological deficits. Extremities: Symmetric 5 x 5 power. Skin: No rashes, lesions or ulcers Psychiatry: Judgement and insight appear normal. Mood & affect appropriate.     Data Reviewed: I have personally reviewed following labs and imaging studies  CBC: Recent Labs  Lab 06/14/19 1216 06/15/19 0417 06/16/19 0510  WBC 12.5* 12.3* 21.0*  NEUTROABS 7.4 10.2* 15.7*  HGB 9.3* 9.2* 9.2*  HCT 34.2* 34.1* 33.9*  MCV 74.0* 72.9* 73.5*  PLT 584* 610* 0000000*   Basic Metabolic Panel: Recent Labs  Lab 06/14/19 1216 06/15/19 0417 06/16/19 0510  NA 140 140 142  K 3.8 4.0 4.0  CL 103 102 104  CO2 29 27 28   GLUCOSE 129* 183* 182*  BUN 13 14 24*  CREATININE 0.98 0.99 1.02  CALCIUM  8.9 9.2 8.9  MG  --  2.3 2.4   GFR: Estimated Creatinine Clearance: 75.9 mL/min (by C-G formula based on SCr of 1.02 mg/dL). Liver Function Tests: Recent Labs  Lab 06/15/19 0417 06/16/19 0510  AST 25 27  ALT 20 23  ALKPHOS 84 74  BILITOT 0.5 0.3  PROT 6.9 6.6  ALBUMIN 3.3* 3.3*   No results for input(s): LIPASE, AMYLASE in the last 168 hours. No results for input(s): AMMONIA in the last 168 hours. Coagulation Profile: No results for input(s): INR, PROTIME in the last 168 hours. Cardiac Enzymes: No results for input(s): CKTOTAL, CKMB, CKMBINDEX, TROPONINI in the last 168 hours. BNP (last 3 results) No results for input(s): PROBNP in the last 8760 hours. HbA1C: No results for input(s): HGBA1C in the last 72 hours. CBG: No results for input(s): GLUCAP in the last 168 hours. Lipid Profile: No results for input(s): CHOL, HDL, LDLCALC, TRIG, CHOLHDL, LDLDIRECT in the last 72 hours. Thyroid Function Tests: No results for input(s): TSH, T4TOTAL, FREET4, T3FREE, THYROIDAB in the last 72 hours. Anemia Panel: No results for input(s): VITAMINB12,  FOLATE, FERRITIN, TIBC, IRON, RETICCTPCT in the last 72 hours. Sepsis Labs: No results for input(s): PROCALCITON, LATICACIDVEN in the last 168 hours.  Recent Results (from the past 240 hour(s))  Respiratory Panel by RT PCR (Flu A&B, Covid) - Nasopharyngeal Swab     Status: None   Collection Time: 06/14/19 12:03 PM   Specimen: Nasopharyngeal Swab  Result Value Ref Range Status   SARS Coronavirus 2 by RT PCR NEGATIVE NEGATIVE Final    Comment: (NOTE) SARS-CoV-2 target nucleic acids are NOT DETECTED. The SARS-CoV-2 RNA is generally detectable in upper respiratoy specimens during the acute phase of infection. The lowest concentration of SARS-CoV-2 viral copies this assay can detect is 131 copies/mL. A negative result does not preclude SARS-Cov-2 infection and should not be used as the sole basis for treatment or other patient management  decisions. A negative result may occur with  improper specimen collection/handling, submission of specimen other than nasopharyngeal swab, presence of viral mutation(s) within the areas targeted by this assay, and inadequate number of viral copies (<131 copies/mL). A negative result must be combined with clinical observations, patient history, and epidemiological information. The expected result is Negative. Fact Sheet for Patients:  PinkCheek.be Fact Sheet for Healthcare Providers:  GravelBags.it This test is not yet ap proved or cleared by the [place] FDA and  has been authorized for detection and/or diagnosis of SARS-CoV-2 by FDA under an Emergency Use Authorization (EUA). This EUA will remain  in effect (meaning this test can be used) for the duration of the COVID-19 declaration under Section 564(b)(1) of the Act, 21 U.S.C. section 360bbb-3(b)(1), unless the authorization is terminated or revoked sooner.    Influenza A by PCR NEGATIVE NEGATIVE Final   Influenza B by PCR NEGATIVE NEGATIVE Final    Comment: (NOTE) The Xpert Xpress SARS-CoV-2/FLU/RSV assay is intended as an aid in  the diagnosis of influenza from Nasopharyngeal swab specimens and  should not be used as a sole basis for treatment. Nasal washings and  aspirates are unacceptable for Xpert Xpress SARS-CoV-2/FLU/RSV  testing. Fact Sheet for Patients: PinkCheek.be Fact Sheet for Healthcare Providers: GravelBags.it This test is not yet approved or cleared by the [place] FDA and  has been authorized for detection and/or diagnosis of SARS-CoV-2 by  FDA under an Emergency Use Authorization (EUA). This EUA will remain  in effect (meaning this test can be used) for the duration of the  Covid-19 declaration under Section 564(b)(1) of the Act, 21  U.S.C. section 360bbb-3(b)(1), unless the authorization  is  terminated or revoked. Performed at [facility], [address], [place], [place] [address]          Radiology Studies: No results found.      Scheduled Meds: . apixaban  5 mg Oral BID  . doxycycline  100 mg Oral Q12H  . famotidine  20 mg Oral QHS  . furosemide  60 mg Intravenous Q12H  . guaiFENesin  1,200 mg Oral BID  . hydrOXYzine  25 mg Oral QHS  . metoprolol tartrate  12.5 mg Oral BID  . mometasone-formoterol  2 puff Inhalation BID  . montelukast  10 mg Oral QHS  . pantoprazole  40 mg Oral Daily  . potassium chloride  30 mEq Oral BID  . sodium chloride flush  3 mL Intravenous Q12H  . tamsulosin  0.4 mg Oral QPC supper  . vitamin B-12  500 mcg Oral Daily   Continuous Infusions: . sodium chloride  LOS: 2 days    Time spent: 30 minutes    Auriella Wieand Darleen Crocker, DO [facility]  If 7PM-7AM, please contact night-coverage [email or web address] 06/16/2019, 1:40 PM

## 2019-06-16 NOTE — Plan of Care (Signed)
  Problem: Nutrition: Goal: Adequate nutrition will be maintained Outcome: Progressing   Problem: Elimination: Goal: Will not experience complications related to urinary retention Outcome: Progressing   Problem: Pain Managment: Goal: General experience of comfort will improve Outcome: Progressing   

## 2019-06-16 NOTE — Telephone Encounter (Signed)
Patient is admitted at [facility] wife wanted to let Dr. Shearon Stalls know. I will forward to Dr. Shearon Stalls to follow up.  Nothing further needed at this time.

## 2019-06-16 NOTE — TOC Progression Note (Addendum)
Transition of Care [facility]) - Progression Note    Patient Details  Name: Derrick Moon MRN: [id number] Date of Birth: 1940/06/29  [facility] CM/SW Contact  Boneta Lucks, RN Phone Number: 06/16/2019, 4:23 PM  Clinical Narrative:   PT is recommending HHPT. Patient requesting  Advanced. Vaughan Basta accepted the referral. TOC spoke with patient yesterday and discussed RN needs as well.  MD updated. TOC to follow for orders.    Addendum 3N1 ordered and will be delivered by Blake Divine with [place].    Expected Discharge Plan: [place] Barriers to Discharge: Continued Medical Work up  Expected Discharge Plan and Services Expected Discharge Plan: [place] In-house Referral: Clinical Social Work   Post Acute Care Choice: [place] arrangements for the past 2 months: [place]: [place] ([place]) Date [place]: 06/16/19 Time [place]: 1622 Representative spoke with at [place]: Romualdo Bolk   Readmission Risk Interventions Readmission Risk Prevention Plan 06/15/2019  Transportation Screening Complete  Home Care Screening Complete  Medication Review (RN CM) Complete  Some recent data might be hidden

## 2019-06-17 LAB — COMPREHENSIVE METABOLIC PANEL
ALT: 22 U/L (ref 0–44)
AST: 26 U/L (ref 15–41)
Albumin: 3.4 g/dL — ABNORMAL LOW (ref 3.5–5.0)
Alkaline Phosphatase: 75 U/L (ref 38–126)
Anion gap: 8 (ref 5–15)
BUN: 23 mg/dL (ref 8–23)
CO2: 30 mmol/L (ref 22–32)
Calcium: 8.9 mg/dL (ref 8.9–10.3)
Chloride: 104 mmol/L (ref 98–111)
Creatinine, Ser: 1.01 mg/dL (ref 0.61–1.24)
GFR calc Af Amer: >60 mL/min (ref >60)
GFR calc non Af Amer: >60 mL/min (ref >60)
Glucose, Bld: 117 mg/dL — ABNORMAL HIGH (ref 70–99)
Potassium: 3.6 mmol/L (ref 3.5–5.1)
Sodium: 142 mmol/L (ref 135–145)
Total Bilirubin: 0.4 mg/dL (ref 0.3–1.2)
Total Protein: 6.5 g/dL (ref 6.5–8.1)

## 2019-06-17 LAB — BRAIN NATRIURETIC PEPTIDE: B Natriuretic Peptide: 132 pg/mL — ABNORMAL HIGH (ref 0.0–100.0)

## 2019-06-17 LAB — MAGNESIUM: Magnesium: 2.4 mg/dL (ref 1.7–2.4)

## 2019-06-17 MED ORDER — HYDROCORTISONE 1 % EX CREA
TOPICAL_CREAM | Freq: Two times a day (BID) | CUTANEOUS | Status: DC
  Filled 2019-06-17: qty 28

## 2019-06-17 NOTE — Telephone Encounter (Signed)
Noted, thank you

## 2019-06-17 NOTE — Progress Notes (Addendum)
PROGRESS NOTE    Derrick Moon  [id number] DOB: [birth year] DOA: 06/14/2019 PCP: [name], MD   Brief Narrative:  Per HPI: Derrick Kiel Brinsonis a 79 y.o.malewithdiagnosis of Gold III COPD, chronic diastolic CHF, chronic venous stasis edema of BLEspresented to the ED with his wife after they had discussed with cardiology earlier today during an office visit. The patient had concerns of increasing shortness of breath and lower extremity edema that has progressively gotten worse over the past several months. He is also had abnormal weight gain over the past several months. His weight has increased from 195 pounds in November 20 19 to 255 pounds today. He has shortness of breath with ambulation walking about 10 feet. He has PND. He has exertional chest discomfort. The patient says that he has been taking his Lasix as prescribed but is not diuresing very well. The patient reports that he is having difficulty sleeping at night due to wheezing coughing and shortness of breath. His echocardiogram from December 2020 showed normal LV systolic function with indeterminate diastolic function. The patient is very frustrated that he is having multiple recurrenthospitalizations for the same types of symptoms. He feels that he gets tuned up in the hospital, discharged and then 2 weeks later he becomes symptomatic again.  3/24: Patient was admitted with concerns for COPD exacerbation along with mild acute on chronic diastolic heart failure exacerbation. He appears to be diuresing well on Lasix 40 mg IV twice daily. He does not appear to be short of breath or wheezing or coughing this morning. We will plan to DC IV steroids and placed on prednisone daily for now and make breathing treatments as needed. Continue to monitor labs and weights which are still up at 255 pounds.  3/25: Patient still has a weight of approximately 250 pounds and has diuresed approximately 1300 mL in the last 24 hours.   Plan to increase Lasix to 60 mg IV twice daily and continue to monitor I's and O's.  3/26: Patient states that he is starting to feel somewhat better and he is at about 248 pounds with a -7.3 L fluid balance noted thus far.  Continue on ongoing Lasix diuresis with plans to transition to torsemide 40 mg every day once ready for discharge.  Monitor a.m. labs.  Assessment & Plan:   Principal Problem:   COPD with acute exacerbation ([name]) Active Problems:   Essential hypertension   DOE (dyspnea on exertion)   COPD GOLD II    PAF (paroxysmal atrial fibrillation) (HCC)   Acute respiratory failure with hypoxemia (HCC)   Enlarged prostate   GERD (gastroesophageal reflux disease)   Lower leg edema   Leukocytosis   Acute COPD exacerbation-improved -Has chronic hypoxemia and wears 2-2.5 L nasal cannula at home -Patient has baseline Gold stage III COPD with long history of prior tobacco abuse -DC further steroids -Continue doxycycline twice daily -DuoNebs changed from scheduled to every 4 hours as needed -PT evaluation  Mild acute on chronic diastolic CHF exacerbation -Continue ongoing diuresis with Lasix now increased to 60 mg IV twice daily --2.7 L diuresis over the last 24 hours with stable renal function -Follow daily labs  Progressive bilateral lower extremity edema-multifactorial -This is likely related to some volume overload as well as amlodipine use which has been discontinued  GERD -Continue PPI  Essential hypertension-stable -Continue home medications with amlodipine currently discontinued  Leukocytosis-stable -Likely related to steroid demargination/stress reaction -Recheck CBC in a.m.  Enlarged prostate with dysuria -Continue Flomax  and Foley for now -Voiding trial prior to discharge  PAF -Eliquis for anticoagulation -Metoprolol for heart rate control -DuoNebs changed as needed  Sleep apnea -Noncompliant on home CPAP and will need outpatient sleep  study -Will arrange to have patient follow-up with neurology, Dr. [name] in outpatient setting  DVT prophylaxis:Eliquis Code Status:Full Family Communication: Discussed with wife 3/24 Disposition Plan:Continue IV diuresis and COPD treatments. Goal weight of approximately 245 pounds.  Anticipate discharge in the next 1-2 days.  Plan to arrange sleep study on discharge.   Consultants:  None  Procedures:  See below  Antimicrobials:  Anti-infectives (From admission, onward)   Start     Dose/Rate Route Frequency Ordered Stop   06/14/19 2200  doxycycline (VIBRA-TABS) tablet 100 mg     100 mg Oral Every 12 hours 06/14/19 1725         Subjective: Patient seen and evaluated today with no new acute complaints or concerns. No acute concerns or events noted overnight.  Objective: Vitals:   06/17/19 0455 06/17/19 0500 06/17/19 0818 06/17/19 0955  BP: (!) 171/78     Pulse: 62     Resp: (!) 22     Temp: 97.7 F (36.5 C)     TempSrc: Oral     SpO2: 97%  96% 96%  Weight:  112.5 kg    Height:        Intake/Output Summary (Last 24 hours) at 06/17/2019 1245 Last data filed at 06/17/2019 1000 Gross per 24 hour  Intake 720 ml  Output 3400 ml  Net -2680 ml   Filed Weights   06/15/19 0500 06/16/19 0500 06/17/19 0500  Weight: 115.8 kg 115.6 kg 112.5 kg    Examination:  General exam: Appears calm and comfortable  Respiratory system: Clear to auscultation. Respiratory effort normal.  Currently on 2 L nasal cannula oxygen. Cardiovascular system: S1 & S2 heard, RRR. No JVD, murmurs, rubs, gallops or clicks. No pedal edema. Gastrointestinal system: Abdomen is nondistended, soft and nontender. No organomegaly or masses felt. Normal bowel sounds heard. Central nervous system: Alert and oriented. No focal neurological deficits. Extremities: Symmetric 5 x 5 power. Skin: No rashes, lesions or ulcers Psychiatry: Judgement and insight appear normal. Mood & affect appropriate.      Data Reviewed: I have personally reviewed following labs and imaging studies  CBC: Recent Labs  Lab 06/14/19 1216 06/15/19 0417 06/16/19 0510  WBC 12.5* 12.3* 21.0*  NEUTROABS 7.4 10.2* 15.7*  HGB 9.3* 9.2* 9.2*  HCT 34.2* 34.1* 33.9*  MCV 74.0* 72.9* 73.5*  PLT 584* 610* 0000000*   Basic Metabolic Panel: Recent Labs  Lab 06/14/19 1216 06/15/19 0417 06/16/19 0510 06/17/19 0447  NA 140 140 142 142  K 3.8 4.0 4.0 3.6  CL 103 102 104 104  CO2 29 27 28 30   GLUCOSE 129* 183* 182* 117*  BUN 13 14 24* 23  CREATININE 0.98 0.99 1.02 1.01  CALCIUM 8.9 9.2 8.9 8.9  MG  --  2.3 2.4 2.4   GFR: Estimated Creatinine Clearance: 75.7 mL/min (by C-G formula based on SCr of 1.01 mg/dL). Liver Function Tests: Recent Labs  Lab 06/15/19 0417 06/16/19 0510 06/17/19 0447  AST 25 27 26   ALT 20 23 22   ALKPHOS 84 74 75  BILITOT 0.5 0.3 0.4  PROT 6.9 6.6 6.5  ALBUMIN 3.3* 3.3* 3.4*   No results for input(s): LIPASE, AMYLASE in the last 168 hours. No results for input(s): AMMONIA in the last 168 hours. Coagulation Profile: No  results for input(s): INR, PROTIME in the last 168 hours. Cardiac Enzymes: No results for input(s): CKTOTAL, CKMB, CKMBINDEX, TROPONINI in the last 168 hours. BNP (last 3 results) No results for input(s): PROBNP in the last 8760 hours. HbA1C: No results for input(s): HGBA1C in the last 72 hours. CBG: No results for input(s): GLUCAP in the last 168 hours. Lipid Profile: No results for input(s): CHOL, HDL, LDLCALC, TRIG, CHOLHDL, LDLDIRECT in the last 72 hours. Thyroid Function Tests: No results for input(s): TSH, T4TOTAL, FREET4, T3FREE, THYROIDAB in the last 72 hours. Anemia Panel: No results for input(s): VITAMINB12, FOLATE, FERRITIN, TIBC, IRON, RETICCTPCT in the last 72 hours. Sepsis Labs: No results for input(s): PROCALCITON, LATICACIDVEN in the last 168 hours.  Recent Results (from the past 240 hour(s))  Respiratory Panel by RT PCR (Flu A&B,  Covid) - Nasopharyngeal Swab     Status: None   Collection Time: 06/14/19 12:03 PM   Specimen: Nasopharyngeal Swab  Result Value Ref Range Status   SARS Coronavirus 2 by RT PCR NEGATIVE NEGATIVE Final    Comment: (NOTE) SARS-CoV-2 target nucleic acids are NOT DETECTED. The SARS-CoV-2 RNA is generally detectable in upper respiratoy specimens during the acute phase of infection. The lowest concentration of SARS-CoV-2 viral copies this assay can detect is 131 copies/mL. A negative result does not preclude SARS-Cov-2 infection and should not be used as the sole basis for treatment or other patient management decisions. A negative result may occur with  improper specimen collection/handling, submission of specimen other than nasopharyngeal swab, presence of viral mutation(s) within the areas targeted by this assay, and inadequate number of viral copies (<131 copies/mL). A negative result must be combined with clinical observations, patient history, and epidemiological information. The expected result is Negative. Fact Sheet for Patients:  PinkCheek.be Fact Sheet for Healthcare Providers:  GravelBags.it This test is not yet ap proved or cleared by the [place] FDA and  has been authorized for detection and/or diagnosis of SARS-CoV-2 by FDA under an Emergency Use Authorization (EUA). This EUA will remain  in effect (meaning this test can be used) for the duration of the COVID-19 declaration under Section 564(b)(1) of the Act, 21 U.S.C. section 360bbb-3(b)(1), unless the authorization is terminated or revoked sooner.    Influenza A by PCR NEGATIVE NEGATIVE Final   Influenza B by PCR NEGATIVE NEGATIVE Final    Comment: (NOTE) The Xpert Xpress SARS-CoV-2/FLU/RSV assay is intended as an aid in  the diagnosis of influenza from Nasopharyngeal swab specimens and  should not be used as a sole basis for treatment. Nasal washings and   aspirates are unacceptable for Xpert Xpress SARS-CoV-2/FLU/RSV  testing. Fact Sheet for Patients: PinkCheek.be Fact Sheet for Healthcare Providers: GravelBags.it This test is not yet approved or cleared by the [place] FDA and  has been authorized for detection and/or diagnosis of SARS-CoV-2 by  FDA under an Emergency Use Authorization (EUA). This EUA will remain  in effect (meaning this test can be used) for the duration of the  Covid-19 declaration under Section 564(b)(1) of the Act, 21  U.S.C. section 360bbb-3(b)(1), unless the authorization is  terminated or revoked. Performed at [facility], [address], [place], [place] [address]          Radiology Studies: No results found.      Scheduled Meds: . apixaban  5 mg Oral BID  . doxycycline  100 mg Oral Q12H  . famotidine  20 mg Oral QHS  . furosemide  60  mg Intravenous Q12H  . guaiFENesin  1,200 mg Oral BID  . hydrocortisone cream   Topical BID  . hydrOXYzine  25 mg Oral QHS  . metoprolol tartrate  12.5 mg Oral BID  . mometasone-formoterol  2 puff Inhalation BID  . montelukast  10 mg Oral QHS  . pantoprazole  40 mg Oral Daily  . potassium chloride  30 mEq Oral BID  . sodium chloride flush  3 mL Intravenous Q12H  . tamsulosin  0.4 mg Oral QPC supper  . vitamin B-12  500 mcg Oral Daily   Continuous Infusions: . sodium chloride       LOS: 3 days    Time spent: 30 minutes    Maeva Dant Darleen Crocker, DO [facility]  If 7PM-7AM, please contact night-coverage [email or web address] 06/17/2019, 12:45 PM

## 2019-06-17 NOTE — Progress Notes (Signed)
Physical Therapy Treatment Patient Details Name: Derrick Moon MRN: [id number] DOB: 11/03/1940 Today's Date: 06/17/2019    History of Present Illness Derrick Moon is a 79 y.o. male with diagnosis of Gold III COPD, chronic diastolic CHF, chronic venous stasis edema of BLEs presented to the ED with his wife after they had discussed with cardiology earlier today during an office visit.  The patient had concerns of increasing shortness of breath and lower extremity edema that has progressively gotten worse over the past several months.  He is also had abnormal weight gain over the past several months.  His weight has increased from 195 pounds in November 20 19 to 255 pounds today.  He has shortness of breath with ambulation walking about 10 feet.  He has PND.  He has exertional chest discomfort.  The patient says that he has been taking his Lasix as prescribed but is not diuresing very well.  The patient reports that he is having difficulty sleeping at night due to wheezing coughing and shortness of breath.  His echocardiogram from December 2020 showed normal LV systolic function with indeterminate diastolic function.  The patient is very frustrated that he is having multiple recurrent hospitalizations for the same types of symptoms.  He feels that he gets tuned up in the hospital, discharged and then 2 weeks later he becomes symptomatic again.    PT Comments    Patient able to transition to seated EOB without physical assist. He is able to perform exercises while seated demonstrating good seated balance. He is educated on continuing to complete exercises while in hospital and upon return home. He transitions to standing without AD and requires min guard assist for safety and balance. Patient ambulates with slightly unsteady gait without AD and reaches for wall several times to improve balance. Patient is limited by severe LE cramping pain after ambulating. Patient will benefit from continued physical  therapy in hospital and recommended venue below to increase strength, balance, endurance for safe ADLs and gait.     Follow Up Recommendations  Home health PT;Supervision for mobility/OOB;Supervision - Intermittent     Equipment Recommendations  3in1 (PT)(Bed side commode)    Recommendations for Other Services       Precautions / Restrictions Precautions Precautions: Fall Restrictions Weight Bearing Restrictions: No    Mobility  Bed Mobility Overal bed mobility: Needs Assistance Bed Mobility: Supine to Sit;Sit to Supine     Supine to sit: Min guard;HOB elevated Sit to supine: Min guard;HOB elevated   General bed mobility comments: increased time labored movement  Transfers Overall transfer level: Needs assistance Equipment used: None Transfers: Sit to/from [facility] Sit to Stand: Supervision;Min guard Stand pivot transfers: Supervision;Min guard       General transfer comment: slightly labored movement, increased time; transfer from bed <>stand, chair<>stand  Ambulation/Gait Ambulation/Gait assistance: Supervision;Min guard Gait Distance (Feet): 45 Feet Assistive device: None Gait Pattern/deviations: Decreased step length - right;Decreased step length - left;Decreased stride length Gait velocity: decreased   General Gait Details: slightly labored cadence without loss of balance, limited due to c/o severe pain due to cramping in legs   Stairs             Wheelchair Mobility    Modified Rankin (Stroke Patients Only)       Balance Overall balance assessment: Needs assistance Sitting-balance support: Feet supported;No upper extremity supported Sitting balance-Leahy Scale: Normal Sitting balance - Comments: seated EOB   Standing balance support: No upper extremity  supported;During functional activity Standing balance-Leahy Scale: Fair Standing balance comment: several reaches for wall in hall while ambulating for support                             Cognition Arousal/Alertness: Awake/alert Behavior During Therapy: WFL for tasks assessed/performed Overall Cognitive Status: Within Functional Limits for tasks assessed                                        Exercises General Exercises - Lower Extremity Long Arc Quad: AROM;Both;20 reps;Seated Hip Flexion/Marching: AROM;Both;20 reps;Seated Toe Raises: AROM;Both;20 reps;Seated Heel Raises: AROM;Both;20 reps;Seated    General Comments        Pertinent Vitals/Pain Pain Assessment: Faces Faces Pain Scale: Hurts whole lot Pain Location: BLE after walking Pain Descriptors / Indicators: Cramping;Tightness;Grimacing Pain Intervention(s): Limited activity within patient's tolerance;Monitored during session;Repositioned    Home Living                      Prior Function            PT Goals (current goals can now be found in the care plan section) Acute Rehab PT Goals Patient Stated Goal: return home with spouse to assist PT Goal Formulation: With patient/family Time For Goal Achievement: 06/22/19 Potential to Achieve Goals: Good Progress towards PT goals: Progressing toward goals    Frequency    Min 3X/week      PT Plan Current plan remains appropriate    Co-evaluation              AM-PAC PT "6 Clicks" Mobility   Outcome Measure  Help needed turning from your back to your side while in a flat bed without using bedrails?: None Help needed moving from lying on your back to sitting on the side of a flat bed without using bedrails?: A Little Help needed moving to and from a bed to a chair (including a wheelchair)?: A Little Help needed standing up from a chair using your arms (e.g., wheelchair or bedside chair)?: A Little Help needed to walk in hospital room?: A Little Help needed climbing 3-5 steps with a railing? : A Lot 6 Click Score: 18    End of Session Equipment Utilized During Treatment:  Oxygen Activity Tolerance: Patient tolerated treatment well;Patient limited by fatigue;Patient limited by pain(LE cramping pain) Patient left: in bed;with call bell/phone within reach Nurse Communication: Mobility status PT Visit Diagnosis: Unsteadiness on feet (R26.81);Other abnormalities of gait and mobility (R26.89);Muscle weakness (generalized) (M62.81)     Time: [id number] PT Time Calculation (min) (ACUTE ONLY): 31 min  Charges:  $Therapeutic Exercise: 8-22 mins $Therapeutic Activity: 8-22 mins                     3:27 PM, 06/17/19 Mearl Latin PT, DPT Physical Therapist at [facility] 06/17/2019, 3:23 PM

## 2019-06-17 NOTE — Care Management Important Message (Signed)
Important Message  Patient Details  Name: Derrick Moon MRN: [id number] Date of Birth: 08-Nov-1940   Medicare Important Message Given:  Yes     Tommy Medal 06/17/2019, 11:56 AM

## 2019-06-18 LAB — COMPREHENSIVE METABOLIC PANEL
ALT: 38 U/L (ref 0–44)
AST: 44 U/L — ABNORMAL HIGH (ref 15–41)
Albumin: 3.8 g/dL (ref 3.5–5.0)
Alkaline Phosphatase: 95 U/L (ref 38–126)
Anion gap: 12 (ref 5–15)
BUN: 28 mg/dL — ABNORMAL HIGH (ref 8–23)
CO2: 30 mmol/L (ref 22–32)
Calcium: 9.2 mg/dL (ref 8.9–10.3)
Chloride: 102 mmol/L (ref 98–111)
Creatinine, Ser: 1.36 mg/dL — ABNORMAL HIGH (ref 0.61–1.24)
GFR calc Af Amer: 57 mL/min — ABNORMAL LOW (ref >60)
GFR calc non Af Amer: 49 mL/min — ABNORMAL LOW (ref >60)
Glucose, Bld: 122 mg/dL — ABNORMAL HIGH (ref 70–99)
Potassium: 3.7 mmol/L (ref 3.5–5.1)
Sodium: 144 mmol/L (ref 135–145)
Total Bilirubin: 0.4 mg/dL (ref 0.3–1.2)
Total Protein: 7.3 g/dL (ref 6.5–8.1)

## 2019-06-18 LAB — CBC
HCT: 39.2 % (ref 39.0–52.0)
Hemoglobin: 10.6 g/dL — ABNORMAL LOW (ref 13.0–17.0)
MCH: 19.9 pg — ABNORMAL LOW (ref 26.0–34.0)
MCHC: 27 g/dL — ABNORMAL LOW (ref 30.0–36.0)
MCV: 73.7 fL — ABNORMAL LOW (ref 80.0–100.0)
Platelets: 667 10*3/uL — ABNORMAL HIGH (ref 150–400)
RBC: 5.32 MIL/uL (ref 4.22–5.81)
RDW: 21.2 % — ABNORMAL HIGH (ref 11.5–15.5)
WBC: 14.1 10*3/uL — ABNORMAL HIGH (ref 4.0–10.5)
nRBC: 0 % (ref 0.0–0.2)

## 2019-06-18 LAB — MAGNESIUM: Magnesium: 2.5 mg/dL — ABNORMAL HIGH (ref 1.7–2.4)

## 2019-06-18 MED ORDER — DOXYCYCLINE HYCLATE 100 MG PO TABS: 100.0000 mg | ORAL_TABLET | Freq: Two times a day (BID) | ORAL | 0 refills | Status: AC

## 2019-06-18 MED ORDER — DEXTROMETHORPHAN POLISTIREX ER 30 MG/5ML PO SUER: 30.0000 mg | Freq: Two times a day (BID) | ORAL | 0 refills | Status: DC | PRN

## 2019-06-18 MED ORDER — TORSEMIDE 20 MG PO TABS: 40.0000 mg | ORAL_TABLET | Freq: Every day | ORAL | 2 refills | Status: DC

## 2019-06-18 MED ORDER — MUSCLE RUB 10-15 % EX CREA
TOPICAL_CREAM | Freq: Four times a day (QID) | CUTANEOUS | Status: DC
  Administered 2019-06-18: 1 via TOPICAL
  Filled 2019-06-18 (×2): qty 85

## 2019-06-18 MED ORDER — LOPERAMIDE HCL 2 MG PO CAPS: 2.0000 mg | ORAL_CAPSULE | ORAL | Status: DC | PRN

## 2019-06-18 MED ORDER — POTASSIUM CHLORIDE CRYS ER 20 MEQ PO TBCR: 20.0000 meq | EXTENDED_RELEASE_TABLET | Freq: Two times a day (BID) | ORAL | 1 refills | Status: DC

## 2019-06-18 NOTE — Discharge Summary (Addendum)
Physician Discharge Summary  Derrick Moon [id number] DOB: [birth year] DOA: 06/14/2019  PCP: [name], MD  Admit date: 06/14/2019  Discharge date: 06/19/2019  Admitted From:Home  Disposition:  Home  Recommendations for Outpatient Follow-up:  1. Follow up with PCP in 1-2 weeks 2. Repeat BMP in 1 week to reassess renal function and potassium levels while on potassium supplementation of 20 mEq twice daily 3. Follow-up with cardiologist Dr. Bronson Ing in 2 weeks 4. Lasix discontinued and patient started on torsemide 40 mg daily to start 3/28 5. Patient to continue on doxycycline for 2 more days to complete 7-day course of treatment as prescribed for COPD exacerbation. 6. Discontinued amlodipine at this point and hopefully this will help with lower extremity edema 7. Continue home nebulizer treatments as needed for shortness of breath or wheezing  Home Health: Yes with RN and PT  Equipment/Devices: Has home oxygen via nasal cannula 2-3 L  Discharge Condition: Stable  CODE STATUS: Full  Diet recommendation: Heart Healthy  Brief/Interim Summary: Per HPI: Derrick H Brinsonis a 79 y.o.malewithdiagnosis of Gold III COPD, chronic diastolic CHF, chronic venous stasis edema of BLEspresented to the ED with his wife after they had discussed with cardiology earlier today during an office visit. The patient had concerns of increasing shortness of breath and lower extremity edema that has progressively gotten worse over the past several months. He is also had abnormal weight gain over the past several months. His weight has increased from 195 pounds in November 20 19 to 255 pounds today. He has shortness of breath with ambulation walking about 10 feet. He has PND. He has exertional chest discomfort. The patient says that he has been taking his Lasix as prescribed but is not diuresing very well. The patient reports that he is having difficulty sleeping at night due to wheezing coughing  and shortness of breath. His echocardiogram from December 2020 showed normal LV systolic function with indeterminate diastolic function. The patient is very frustrated that he is having multiple recurrenthospitalizations for the same types of symptoms. He feels that he gets tuned up in the hospital, discharged and then 2 weeks later he becomes symptomatic again.  3/24: Patient was admitted with concerns for COPD exacerbation along with mild acute on chronic diastolic heart failure exacerbation. He appears to be diuresing well on Lasix 40 mg IV twice daily. He does not appear to be short of breath or wheezing or coughing this morning. We will plan to DC IV steroids and placed on prednisone daily for now and make breathing treatments as needed. Continue to monitor labs and weights which are still up at 255 pounds.  3/25:Patient still has a weight of approximately 250 pounds and has diuresed approximately 1300 mL in the last 24 hours. Plan to increase Lasix to 60 mg IV twice daily and continue to monitor I's and O's.  3/26: Patient states that he is starting to feel somewhat better and he is at about 248 pounds with a -7.3 L fluid balance noted thus far.  Continue on ongoing Lasix diuresis with plans to transition to torsemide 40 mg every day once ready for discharge.  Monitor a.m. labs.  3/27: Patient is down to 239 pounds and has no further edema noted.  He has been diuresed to renal tolerance with creatinine that has increased to 1.36 on day of discharge.  No further IV diuresis indicated and he will be started on torsemide starting tomorrow.  He has ambulated on his usual nasal cannula oxygen  at baseline and will be set up for home health PT and RN.  He will need close follow-up with repeat BMP in 1 week as well as follow-up with his cardiologist Dr. Bronson Ing in the outpatient setting.  Discharge Diagnoses:  Principal Problem:   COPD with acute exacerbation ([place]) Active Problems:    Essential hypertension   DOE (dyspnea on exertion)   COPD GOLD II    PAF (paroxysmal atrial fibrillation) (H[facility])   Acute respiratory failure with hypoxemia ([facility])   Enlarged prostate   GERD (gastroesophageal reflux disease)   Lower leg edema   Leukocytosis  Principal discharge diagnosis: Acute COPD exacerbation along with acute on chronic diastolic CHF exacerbation.  Discharge Instructions  Discharge Instructions    Diet - low sodium heart healthy   Complete by: As directed    Increase activity slowly   Complete by: As directed      Allergies as of 06/19/2019      Reactions   Adhesive [tape] Other (See Comments)   SKIN IS VERY THIN AND IT TEARS VERY EASILY!!! Please use an alternative   Aspartame And Phenylalanine Nausea Only   Other Rash   Pt reports rash on back, buttocks, shoulders and stomach after arteriogram   Sulfonamide Derivatives Nausea And Vomiting      Medication List    STOP taking these medications   amLODipine 10 MG tablet Commonly known as: NORVASC   furosemide 40 MG tablet Commonly known as: Lasix     TAKE these medications   acetaminophen 500 MG tablet Commonly known as: TYLENOL Take 1,000 mg by mouth every 6 (six) hours as needed (for pain or headaches).   albuterol 108 (90 Base) MCG/ACT inhaler Commonly known as: VENTOLIN HFA Inhale 1-2 puffs into the lungs every 6 (six) hours as needed for wheezing or shortness of breath. Reported on 10/03/2015   apixaban 5 MG Tabs tablet Commonly known as: ELIQUIS Take 1 tablet (5 mg total) 2 (two) times daily by mouth.   budesonide-formoterol 160-4.5 MCG/ACT inhaler Commonly known as: Symbicort Inhale 2 puffs into the lungs 2 (two) times daily.   dextromethorphan 30 MG/5ML liquid Commonly known as: DELSYM Take 5 mLs (30 mg total) by mouth 2 (two) times daily as needed for cough.   diphenhydrAMINE 25 MG tablet Commonly known as: BENADRYL Take 25 mg by mouth every 6 (six) hours as needed for itching or  allergies.   docusate sodium 100 MG capsule Commonly known as: COLACE Take 1 capsule (100 mg total) by mouth 2 (two) times daily. What changed:   when to take this  reasons to take this   doxycycline 100 MG tablet Commonly known as: VIBRA-TABS Take 1 tablet (100 mg total) by mouth every 12 (twelve) hours for 2 days.   famotidine 20 MG tablet Commonly known as: PEPCID TAKE (1) TABLET BY MOUTH AT BEDTIME. What changed: See the new instructions.   guaiFENesin 600 MG 12 hr tablet Commonly known as: MUCINEX Take 600 mg by mouth 2 (two) times daily as needed for cough or to loosen phlegm.   hydrOXYzine 25 MG tablet Commonly known as: ATARAX/VISTARIL Take 1 tablet (25 mg total) by mouth at bedtime.   ipratropium-albuterol 0.5-2.5 (3) MG/3ML Soln Commonly known as: DUONEB Take 3 mLs by nebulization every 4 (four) hours as needed.   metoprolol tartrate 25 MG tablet Commonly known as: LOPRESSOR Take 0.5 tablets (12.5 mg total) by mouth 2 (two) times daily.   montelukast 10 MG tablet Commonly known  as: SINGULAIR TAKE (1) TABLET BY MOUTH AT BEDTIME. What changed: See the new instructions.   Muscle Rub 10-15 % Crea Apply 1 application topically 4 (four) times daily.   pantoprazole 40 MG tablet Commonly known as: PROTONIX TAKE 1 TABLET BY MOUTH 30 TO 60 MINUTES PRIOR TO THE FIRST MEAL OF THE DAY. What changed: See the new instructions.   potassium chloride SA 20 MEQ tablet Commonly known as: KLOR-CON Take 1 tablet (20 mEq total) by mouth 2 (two) times daily.   tamsulosin 0.4 MG Caps capsule Commonly known as: FLOMAX Take 0.4 mg by mouth daily after supper.   torsemide 20 MG tablet Commonly known as: DEMADEX Take 2 tablets (40 mg total) by mouth daily.   vitamin B-12 500 MCG tablet Commonly known as: CYANOCOBALAMIN Take 500 mcg by mouth in the morning and at bedtime.      Follow-up Information    [facility] Follow [facility].   Specialty: [place] Why:   PT/RN       [name], MD Follow up in 1 week(s).   Specialty: Internal Medicine Contact information: [address] [place] [address] [phone]        Herminio Commons, MD Follow up in 2 week(s).   Specialty: Cardiology Contact information: [place] [address] [phone]          Allergies  Allergen Reactions  . Adhesive [Tape] Other (See Comments)    SKIN IS VERY THIN AND IT TEARS VERY EASILY!!! Please use an alternative  . Aspartame And Phenylalanine Nausea Only  . Other Rash    Pt reports rash on back, buttocks, shoulders and stomach after arteriogram  . Sulfonamide Derivatives Nausea And Vomiting    Consultations:  None   Procedures/Studies: DG Chest Port 1 View  Result Date: 06/14/2019 CLINICAL DATA:  Shortness of breath EXAM: PORTABLE CHEST 1 VIEW COMPARISON:  March 11, 2019 FINDINGS: Lungs are clear. Heart size and pulmonary vascularity are normal. No adenopathy. There is a total shoulder replacement on the right. Postoperative change noted in the left humeral head region. IMPRESSION: Lungs clear.  Cardiac silhouette within normal limits. Electronically Signed   By: Lowella Grip III M.D.   On: 06/14/2019 12:26    Discharge Exam: Vitals:   06/18/19 2138 06/19/19 0541  BP: (!) 148/76 126/73  Pulse: 77 82  Resp: 19 (!) 24  Temp: 98.2 F (36.8 C) 97.9 F (36.6 C)  SpO2: 97% 100%   Vitals:   06/18/19 1955 06/18/19 1959 06/18/19 2138 06/19/19 0541  BP:   (!) 148/76 126/73  Pulse:   77 82  Resp:   19 (!) 24  Temp:   98.2 F (36.8 C) 97.9 F (36.6 C)  TempSrc:   Oral Oral  SpO2: 98% 98% 97% 100%  Weight:      Height:        General: Pt is alert, awake, not in acute distress Cardiovascular: RRR, S1/S2 +, no rubs, no gallops Respiratory: CTA bilaterally, no wheezing, no rhonchi, on 2-3 L nasal cannula oxygen Abdominal: Soft, NT, ND, bowel sounds + Extremities: no edema, no  cyanosis    The results of significant diagnostics from this hospitalization (including imaging, microbiology, ancillary and laboratory) are listed below for reference.     Microbiology: Recent Results (from the past 240 hour(s))  Respiratory Panel by RT PCR (Flu A&B, Covid) - Nasopharyngeal Swab     Status: None   Collection  Time: 06/14/19 12:03 PM   Specimen: Nasopharyngeal Swab  Result Value Ref Range Status   SARS Coronavirus 2 by RT PCR NEGATIVE NEGATIVE Final    Comment: (NOTE) SARS-CoV-2 target nucleic acids are NOT DETECTED. The SARS-CoV-2 RNA is generally detectable in upper respiratoy specimens during the acute phase of infection. The lowest concentration of SARS-CoV-2 viral copies this assay can detect is 131 copies/mL. A negative result does not preclude SARS-Cov-2 infection and should not be used as the sole basis for treatment or other patient management decisions. A negative result may occur with  improper specimen collection/handling, submission of specimen other than nasopharyngeal swab, presence of viral mutation(s) within the areas targeted by this assay, and inadequate number of viral copies (<131 copies/mL). A negative result must be combined with clinical observations, patient history, and epidemiological information. The expected result is Negative. Fact Sheet for Patients:  PinkCheek.be Fact Sheet for Healthcare Providers:  GravelBags.it This test is not yet ap proved or cleared by the [place] FDA and  has been authorized for detection and/or diagnosis of SARS-CoV-2 by FDA under an Emergency Use Authorization (EUA). This EUA will remain  in effect (meaning this test can be used) for the duration of the COVID-19 declaration under Section 564(b)(1) of the Act, 21 U.S.C. section 360bbb-3(b)(1), unless the authorization is terminated or revoked sooner.    Influenza A by PCR NEGATIVE NEGATIVE  Final   Influenza B by PCR NEGATIVE NEGATIVE Final    Comment: (NOTE) The Xpert Xpress SARS-CoV-2/FLU/RSV assay is intended as an aid in  the diagnosis of influenza from Nasopharyngeal swab specimens and  should not be used as a sole basis for treatment. Nasal washings and  aspirates are unacceptable for Xpert Xpress SARS-CoV-2/FLU/RSV  testing. Fact Sheet for Patients: PinkCheek.be Fact Sheet for Healthcare Providers: GravelBags.it This test is not yet approved or cleared by the [place] FDA and  has been authorized for detection and/or diagnosis of SARS-CoV-2 by  FDA under an Emergency Use Authorization (EUA). This EUA will remain  in effect (meaning this test can be used) for the duration of the  Covid-19 declaration under Section 564(b)(1) of the Act, 21  U.S.C. section 360bbb-3(b)(1), unless the authorization is  terminated or revoked. Performed at [facility], [address], [place], [place] [address]      Labs: BNP (last 3 results) Recent Labs    06/15/19 0417 06/16/19 0510 06/17/19 0447  BNP 175.0* 159.0* [id number]*   Basic Metabolic Panel: Recent Labs  Lab 06/15/19 0417 06/16/19 0510 06/17/19 0447 06/18/19 0606 06/19/19 0545  NA 140 142 142 144 143  K 4.0 4.0 3.6 3.7 4.1  CL 102 104 104 102 106  CO2 27 28 30 30 27   GLUCOSE 183* 182* 117* 122* 130*  BUN 14 24* 23 28* 22  CREATININE 0.99 1.02 1.01 1.36* 1.01  CALCIUM 9.2 8.9 8.9 9.2 9.4  MG 2.3 2.4 2.4 2.5*  --    Liver Function Tests: Recent Labs  Lab 06/15/19 0417 06/16/19 0510 06/17/19 0447 06/18/19 0606 06/19/19 0545  AST 25 27 26  44* 38  ALT 20 23 22  38 38  ALKPHOS 84 74 75 95 89  BILITOT 0.5 0.3 0.4 0.4 0.6  PROT 6.9 6.6 6.5 7.3 6.8  ALBUMIN 3.3* 3.3* 3.4* 3.8 3.6   No results for input(s): LIPASE, AMYLASE in the last 168 hours. No results for input(s): AMMONIA in the last 168 hours. CBC: Recent Labs  Lab 06/14/19 1216  06/15/19 0417  06/16/19 0510 06/18/19 0606  WBC 12.5* 12.3* 21.0* 14.1*  NEUTROABS 7.4 10.2* 15.7*  --   HGB 9.3* 9.2* 9.2* 10.6*  HCT 34.2* 34.1* 33.9* 39.2  MCV 74.0* 72.9* 73.5* 73.7*  PLT 584* 610* 600* 667*   Cardiac Enzymes: No results for input(s): CKTOTAL, CKMB, CKMBINDEX, TROPONINI in the last 168 hours. BNP: Invalid input(s): POCBNP CBG: No results for input(s): GLUCAP in the last 168 hours. D-Dimer No results for input(s): DDIMER in the last 72 hours. Hgb A1c No results for input(s): HGBA1C in the last 72 hours. Lipid Profile No results for input(s): CHOL, HDL, LDLCALC, TRIG, CHOLHDL, LDLDIRECT in the last 72 hours. Thyroid function studies No results for input(s): TSH, T4TOTAL, T3FREE, THYROIDAB in the last 72 hours.  Invalid input(s): FREET3 Anemia work up No results for input(s): VITAMINB12, FOLATE, FERRITIN, TIBC, IRON, RETICCTPCT in the last 72 hours. Urinalysis    Component Value Date/Time   COLORURINE YELLOW 09/14/2015 [place] 09/14/2015 0934   LABSPEC 1.028 09/14/2015 0934   PHURINE 5.0 09/14/2015 0934   GLUCOSEU NEGATIVE 09/14/2015 0934   HGBUR NEGATIVE 09/14/2015 0934   BILIRUBINUR NEGATIVE 09/14/2015 0934   KETONESUR NEGATIVE 09/14/2015 0934   PROTEINUR NEGATIVE 09/14/2015 0934   UROBILINOGEN 0.2 10/06/2008 0800   NITRITE NEGATIVE 09/14/2015 0934   LEUKOCYTESUR NEGATIVE 09/14/2015 0934   Sepsis Labs Invalid input(s): PROCALCITONIN,  WBC,  LACTICIDVEN Microbiology Recent Results (from the past 240 hour(s))  Respiratory Panel by RT PCR (Flu A&B, Covid) - Nasopharyngeal Swab     Status: None   Collection Time: 06/14/19 12:03 PM   Specimen: Nasopharyngeal Swab  Result Value Ref Range Status   SARS Coronavirus 2 by RT PCR NEGATIVE NEGATIVE Final    Comment: (NOTE) SARS-CoV-2 target nucleic acids are NOT DETECTED. The SARS-CoV-2 RNA is generally detectable in upper respiratoy specimens during the acute phase of infection. The  lowest concentration of SARS-CoV-2 viral copies this assay can detect is 131 copies/mL. A negative result does not preclude SARS-Cov-2 infection and should not be used as the sole basis for treatment or other patient management decisions. A negative result may occur with  improper specimen collection/handling, submission of specimen other than nasopharyngeal swab, presence of viral mutation(s) within the areas targeted by this assay, and inadequate number of viral copies (<131 copies/mL). A negative result must be combined with clinical observations, patient history, and epidemiological information. The expected result is Negative. Fact Sheet for Patients:  PinkCheek.be Fact Sheet for Healthcare Providers:  GravelBags.it This test is not yet ap proved or cleared by the [place] FDA and  has been authorized for detection and/or diagnosis of SARS-CoV-2 by FDA under an Emergency Use Authorization (EUA). This EUA will remain  in effect (meaning this test can be used) for the duration of the COVID-19 declaration under Section 564(b)(1) of the Act, 21 U.S.C. section 360bbb-3(b)(1), unless the authorization is terminated or revoked sooner.    Influenza A by PCR NEGATIVE NEGATIVE Final   Influenza B by PCR NEGATIVE NEGATIVE Final    Comment: (NOTE) The Xpert Xpress SARS-CoV-2/FLU/RSV assay is intended as an aid in  the diagnosis of influenza from Nasopharyngeal swab specimens and  should not be used as a sole basis for treatment. Nasal washings and  aspirates are unacceptable for Xpert Xpress SARS-CoV-2/FLU/RSV  testing. Fact Sheet for Patients: PinkCheek.be Fact Sheet for Healthcare Providers: GravelBags.it This test is not yet approved or cleared by the [place] FDA and  has been  authorized for detection and/or diagnosis of SARS-CoV-2 by  FDA under an Emergency  Use Authorization (EUA). This EUA will remain  in effect (meaning this test can be used) for the duration of the  Covid-19 declaration under Section 564(b)(1) of the Act, 21  U.S.C. section 360bbb-3(b)(1), unless the authorization is  terminated or revoked. Performed at [facility], [address] [place], [place] [address]      Time coordinating discharge: 35 minutes  SIGNED:   Rodena Goldmann, DO [facility] 06/19/2019, 7:18 AM  If 7PM-7AM, please contact night-coverage [email or web address]

## 2019-06-18 NOTE — Plan of Care (Signed)

## 2019-06-18 NOTE — Progress Notes (Signed)
Patient called [phone] for neb tx stated he was having trouble breathing. Did have upper airway wheezes and some congestion. Coughing up yellow sputum light yellow thin. Lung sounds mostly diminished with some transmitted wheezes from upper.

## 2019-06-18 NOTE — TOC Transition Note (Signed)
Transition of Care [facility]) - CM/SW Discharge Note   Patient Details  Name: VIC FLOWERS MRN: [id number] Date of Birth: 1940-09-12  Transition of Care [facility]) CM/SW Contact:  Boneta Lucks, RN Phone Number: 06/18/2019, 2:06 PM   Clinical Narrative:   Patient discharging home with [place]. Corene Cornea updated with DC plan.     Final next level of care: [place] Barriers to Discharge: Barriers Resolved   Patient Goals and CMS Choice Patient states their goals for this hospitalization and ongoing recovery are:: to go home. CMS Medicare.gov Compare Post Acute Care list provided to:: Patient Choice offered to / list presented to : Patient  Discharge Placement      Patient and family notified of of transfer: 06/18/19  Discharge Plan and Services In-house Referral: Clinical Social Work   Post Acute Care Choice: [place]: [place] ([place]) Date [place]: 06/16/19 Time [place]: 1622 Representative spoke with at [place]: Romualdo Bolk  Readmission Risk Interventions Readmission Risk Prevention Plan 06/15/2019  Transportation Screening Complete  Home Care Screening Complete  Medication Review (RN CM) Complete  Some recent data might be hidden

## 2019-06-19 DIAGNOSIS — J9601 Acute respiratory failure with hypoxia: Secondary | ICD-10-CM | POA: Diagnosis not present

## 2019-06-19 DIAGNOSIS — C679 Malignant neoplasm of bladder, unspecified: Secondary | ICD-10-CM | POA: Diagnosis not present

## 2019-06-19 DIAGNOSIS — I739 Peripheral vascular disease, unspecified: Secondary | ICD-10-CM | POA: Diagnosis not present

## 2019-06-19 DIAGNOSIS — I5033 Acute on chronic diastolic (congestive) heart failure: Secondary | ICD-10-CM | POA: Diagnosis not present

## 2019-06-19 DIAGNOSIS — I480 Paroxysmal atrial fibrillation: Secondary | ICD-10-CM | POA: Diagnosis not present

## 2019-06-19 DIAGNOSIS — J441 Chronic obstructive pulmonary disease with (acute) exacerbation: Secondary | ICD-10-CM | POA: Diagnosis not present

## 2019-06-19 DIAGNOSIS — G510 Bell's palsy: Secondary | ICD-10-CM | POA: Diagnosis not present

## 2019-06-19 DIAGNOSIS — M1990 Unspecified osteoarthritis, unspecified site: Secondary | ICD-10-CM | POA: Diagnosis not present

## 2019-06-19 DIAGNOSIS — R3915 Urgency of urination: Secondary | ICD-10-CM | POA: Diagnosis not present

## 2019-06-19 DIAGNOSIS — I110 Hypertensive heart disease with heart failure: Secondary | ICD-10-CM | POA: Diagnosis not present

## 2019-06-19 DIAGNOSIS — I872 Venous insufficiency (chronic) (peripheral): Secondary | ICD-10-CM | POA: Diagnosis not present

## 2019-06-19 DIAGNOSIS — N401 Enlarged prostate with lower urinary tract symptoms: Secondary | ICD-10-CM | POA: Diagnosis not present

## 2019-06-19 DIAGNOSIS — K219 Gastro-esophageal reflux disease without esophagitis: Secondary | ICD-10-CM | POA: Diagnosis not present

## 2019-06-19 LAB — COMPREHENSIVE METABOLIC PANEL
ALT: 38 U/L (ref 0–44)
AST: 38 U/L (ref 15–41)
Albumin: 3.6 g/dL (ref 3.5–5.0)
Alkaline Phosphatase: 89 U/L (ref 38–126)
Anion gap: 10 (ref 5–15)
BUN: 22 mg/dL (ref 8–23)
CO2: 27 mmol/L (ref 22–32)
Calcium: 9.4 mg/dL (ref 8.9–10.3)
Chloride: 106 mmol/L (ref 98–111)
Creatinine, Ser: 1.01 mg/dL (ref 0.61–1.24)
GFR calc Af Amer: >60 mL/min (ref >60)
GFR calc non Af Amer: >60 mL/min (ref >60)
Glucose, Bld: 130 mg/dL — ABNORMAL HIGH (ref 70–99)
Potassium: 4.1 mmol/L (ref 3.5–5.1)
Sodium: 143 mmol/L (ref 135–145)
Total Bilirubin: 0.6 mg/dL (ref 0.3–1.2)
Total Protein: 6.8 g/dL (ref 6.5–8.1)

## 2019-06-19 MED ORDER — MUSCLE RUB 10-15 % EX CREA: 1.0000 "application " | TOPICAL_CREAM | Freq: Four times a day (QID) | CUTANEOUS | 0 refills | Status: DC

## 2019-06-19 MED ORDER — HYDROXYZINE HCL 25 MG PO TABS: 25.0000 mg | ORAL_TABLET | Freq: Every day | ORAL | 0 refills | Status: DC

## 2019-06-19 MED ORDER — TORSEMIDE 20 MG PO TABS
40.0000 mg | ORAL_TABLET | Freq: Once | ORAL | Status: AC
  Administered 2019-06-19: 09:00:00 40 mg via ORAL
  Filled 2019-06-19: qty 2

## 2019-06-19 NOTE — Progress Notes (Signed)
Nsg Discharge Note  Admit Date:  06/14/2019 Discharge date: 06/19/2019   Derrick Moon to be D/C'd Home  per MD order.  AVS completed.  Patient/caregiver able to verbalize understanding.  Discharge Medication: Allergies as of 06/19/2019      Reactions   Adhesive [tape] Other (See Comments)   SKIN IS VERY THIN AND IT TEARS VERY EASILY!!! Please use an alternative   Aspartame And Phenylalanine Nausea Only   Other Rash   Pt reports rash on back, buttocks, shoulders and stomach after arteriogram   Sulfonamide Derivatives Nausea And Vomiting      Medication List    STOP taking these medications   amLODipine 10 MG tablet Commonly known as: NORVASC   furosemide 40 MG tablet Commonly known as: Lasix     TAKE these medications   acetaminophen 500 MG tablet Commonly known as: TYLENOL Take 1,000 mg by mouth every 6 (six) hours as needed (for pain or headaches).   albuterol 108 (90 Base) MCG/ACT inhaler Commonly known as: VENTOLIN HFA Inhale 1-2 puffs into the lungs every 6 (six) hours as needed for wheezing or shortness of breath. Reported on 10/03/2015   apixaban 5 MG Tabs tablet Commonly known as: ELIQUIS Take 1 tablet (5 mg total) 2 (two) times daily by mouth.   budesonide-formoterol 160-4.5 MCG/ACT inhaler Commonly known as: Symbicort Inhale 2 puffs into the lungs 2 (two) times daily.   dextromethorphan 30 MG/5ML liquid Commonly known as: DELSYM Take 5 mLs (30 mg total) by mouth 2 (two) times daily as needed for cough.   diphenhydrAMINE 25 MG tablet Commonly known as: BENADRYL Take 25 mg by mouth every 6 (six) hours as needed for itching or allergies.   docusate sodium 100 MG capsule Commonly known as: COLACE Take 1 capsule (100 mg total) by mouth 2 (two) times daily. What changed:   when to take this  reasons to take this   doxycycline 100 MG tablet Commonly known as: VIBRA-TABS Take 1 tablet (100 mg total) by mouth every 12 (twelve) hours for 2 days.    famotidine 20 MG tablet Commonly known as: PEPCID TAKE (1) TABLET BY MOUTH AT BEDTIME. What changed: See the new instructions.   guaiFENesin 600 MG 12 hr tablet Commonly known as: MUCINEX Take 600 mg by mouth 2 (two) times daily as needed for cough or to loosen phlegm.   hydrOXYzine 25 MG tablet Commonly known as: ATARAX/VISTARIL Take 1 tablet (25 mg total) by mouth at bedtime.   ipratropium-albuterol 0.5-2.5 (3) MG/3ML Soln Commonly known as: DUONEB Take 3 mLs by nebulization every 4 (four) hours as needed.   metoprolol tartrate 25 MG tablet Commonly known as: LOPRESSOR Take 0.5 tablets (12.5 mg total) by mouth 2 (two) times daily.   montelukast 10 MG tablet Commonly known as: SINGULAIR TAKE (1) TABLET BY MOUTH AT BEDTIME. What changed: See the new instructions.   Muscle Rub 10-15 % Crea Apply 1 application topically 4 (four) times daily.   pantoprazole 40 MG tablet Commonly known as: PROTONIX TAKE 1 TABLET BY MOUTH 30 TO 60 MINUTES PRIOR TO THE FIRST MEAL OF THE DAY. What changed: See the new instructions.   potassium chloride SA 20 MEQ tablet Commonly known as: KLOR-CON Take 1 tablet (20 mEq total) by mouth 2 (two) times daily.   tamsulosin 0.4 MG Caps capsule Commonly known as: FLOMAX Take 0.4 mg by mouth daily after supper.   torsemide 20 MG tablet Commonly known as: DEMADEX Take 2 tablets (40  mg total) by mouth daily.   vitamin B-12 500 MCG tablet Commonly known as: CYANOCOBALAMIN Take 500 mcg by mouth in the morning and at bedtime.       Discharge Assessment: Vitals:   06/19/19 0723 06/19/19 0827  BP:  120/72  Pulse:  78  Resp:    Temp:    SpO2: 96%    Skin clean, dry and intact without evidence of skin break down, no evidence of skin tears noted. IV catheter discontinued intact. Site without signs and symptoms of complications - no redness or edema noted at insertion site, patient denies c/o pain - only slight tenderness at site.  Dressing with  slight pressure applied.  D/c Instructions-Education: Discharge instructions given to patient/family with verbalized understanding. D/c education completed with patient/family including follow up instructions, medication list, d/c activities limitations if indicated, with other d/c instructions as indicated by MD - patient able to verbalize understanding, all questions fully answered. Patient instructed to return to ED, call 911, or call MD for any changes in condition.  Patient escorted via [place], and D/C home via private auto.  Berton Bon, RN 06/19/2019 2:49 PM

## 2019-06-19 NOTE — Progress Notes (Signed)
Patient seen and evaluated this morning.  He states that he is feeling much better this morning after using the muscle rub cream for his legs.  He also slept quite well overnight and is ready for discharge with no events noted overnight.  He will be started on torsemide 40 mg daily and has been given a refill of his hydroxyzine to help him sleep at night.  Please refer to discharge summary dictated 3/27 for full details.  Total care time: 25 minutes.

## 2019-06-23 DIAGNOSIS — I110 Hypertensive heart disease with heart failure: Secondary | ICD-10-CM | POA: Diagnosis not present

## 2019-06-23 DIAGNOSIS — M1990 Unspecified osteoarthritis, unspecified site: Secondary | ICD-10-CM | POA: Diagnosis not present

## 2019-06-23 DIAGNOSIS — N401 Enlarged prostate with lower urinary tract symptoms: Secondary | ICD-10-CM | POA: Diagnosis not present

## 2019-06-23 DIAGNOSIS — C679 Malignant neoplasm of bladder, unspecified: Secondary | ICD-10-CM | POA: Diagnosis not present

## 2019-06-23 DIAGNOSIS — R3915 Urgency of urination: Secondary | ICD-10-CM | POA: Diagnosis not present

## 2019-06-23 DIAGNOSIS — K219 Gastro-esophageal reflux disease without esophagitis: Secondary | ICD-10-CM | POA: Diagnosis not present

## 2019-06-23 DIAGNOSIS — I480 Paroxysmal atrial fibrillation: Secondary | ICD-10-CM | POA: Diagnosis not present

## 2019-06-23 DIAGNOSIS — I872 Venous insufficiency (chronic) (peripheral): Secondary | ICD-10-CM | POA: Diagnosis not present

## 2019-06-23 DIAGNOSIS — I5033 Acute on chronic diastolic (congestive) heart failure: Secondary | ICD-10-CM | POA: Diagnosis not present

## 2019-06-23 DIAGNOSIS — J441 Chronic obstructive pulmonary disease with (acute) exacerbation: Secondary | ICD-10-CM | POA: Diagnosis not present

## 2019-06-23 DIAGNOSIS — J9601 Acute respiratory failure with hypoxia: Secondary | ICD-10-CM | POA: Diagnosis not present

## 2019-06-23 DIAGNOSIS — I739 Peripheral vascular disease, unspecified: Secondary | ICD-10-CM | POA: Diagnosis not present

## 2019-06-23 DIAGNOSIS — G510 Bell's palsy: Secondary | ICD-10-CM | POA: Diagnosis not present

## 2019-06-28 DIAGNOSIS — Z79899 Other long term (current) drug therapy: Secondary | ICD-10-CM | POA: Diagnosis not present

## 2019-06-28 DIAGNOSIS — R601 Generalized edema: Secondary | ICD-10-CM | POA: Diagnosis not present

## 2019-06-28 DIAGNOSIS — J449 Chronic obstructive pulmonary disease, unspecified: Secondary | ICD-10-CM | POA: Diagnosis not present

## 2019-06-28 DIAGNOSIS — R609 Edema, unspecified: Secondary | ICD-10-CM | POA: Diagnosis not present

## 2019-06-28 DIAGNOSIS — D649 Anemia, unspecified: Secondary | ICD-10-CM | POA: Diagnosis not present

## 2019-06-30 ENCOUNTER — Ambulatory Visit (INDEPENDENT_AMBULATORY_CARE_PROVIDER_SITE_OTHER): Payer: PPO | Admitting: Cardiovascular Disease

## 2019-06-30 ENCOUNTER — Encounter: Payer: Self-pay | Admitting: Cardiovascular Disease

## 2019-06-30 ENCOUNTER — Other Ambulatory Visit: Payer: Self-pay

## 2019-06-30 VITALS — BP 118/70 | HR 88 | Ht 71.0 in | Wt 239.4 lb

## 2019-06-30 DIAGNOSIS — I10 Essential (primary) hypertension: Secondary | ICD-10-CM | POA: Diagnosis not present

## 2019-06-30 DIAGNOSIS — I5032 Chronic diastolic (congestive) heart failure: Secondary | ICD-10-CM

## 2019-06-30 DIAGNOSIS — J449 Chronic obstructive pulmonary disease, unspecified: Secondary | ICD-10-CM

## 2019-06-30 DIAGNOSIS — I2510 Atherosclerotic heart disease of native coronary artery without angina pectoris: Secondary | ICD-10-CM | POA: Diagnosis not present

## 2019-06-30 DIAGNOSIS — I739 Peripheral vascular disease, unspecified: Secondary | ICD-10-CM

## 2019-06-30 DIAGNOSIS — I2583 Coronary atherosclerosis due to lipid rich plaque: Secondary | ICD-10-CM

## 2019-06-30 DIAGNOSIS — I480 Paroxysmal atrial fibrillation: Secondary | ICD-10-CM | POA: Diagnosis not present

## 2019-06-30 NOTE — Patient Instructions (Addendum)
Your physician recommends that you schedule a follow-up appointment in: 3 MONTHS WITH EXTENDER   Your physician has recommended you make the following change in your medication:   FOR WEIGHT GAIN OVER 3LBS IN 24 HOURS CAN TAKE 20 MG OF TORSEMIDE AND EXTRA POTASSIUM   Thank you for choosing [facility]!!

## 2019-06-30 NOTE — Progress Notes (Signed)
SUBJECTIVE: The patient presents for follow-up after being hospitalized for COPD exacerbation and acute on chronic diastolic heart failure.  His weight was down to 239 pounds on 06/18/2019 and creatinine was down to 1.36.  He was prescribed torsemide at discharge.  Past medical history includes peripheral vascular disease, paroxysmal atrial fibrillation(requiring direct-current cardioversion on 02/06/2017), hypertension, hyperthyroidism, prior tobacco use, nonobstructive coronary artery disease,and obstructive sleep apnea.  Echocardiogram on 03/12/2019 demonstrated normal LV systolic function, EF [id number], with indeterminate diastolic function.  Apart from the heat and pollen counts which have led to some increased shortness of breath when he wiped in the parking to the office, he is otherwise been doing well.  It was his wife's birthday this past Tuesday and he ate [place] food and put on about 2 pounds of fluid.    Review of Systems: As per "subjective", otherwise negative.  Allergies  Allergen Reactions  . Adhesive [Tape] Other (See Comments)    SKIN IS VERY THIN AND IT TEARS VERY EASILY!!! Please use an alternative  . Aspartame And Phenylalanine Nausea Only  . Other Rash    Pt reports rash on back, buttocks, shoulders and stomach after arteriogram  . Sulfonamide Derivatives Nausea And Vomiting    Current Outpatient Medications  Medication Sig Dispense Refill  . acetaminophen (TYLENOL) 500 MG tablet Take 1,000 mg by mouth every 6 (six) hours as needed (for pain or headaches).     [name] albuterol (PROVENTIL HFA;VENTOLIN HFA) 108 (90 BASE) MCG/ACT inhaler Inhale 1-2 puffs into the lungs every 6 (six) hours as needed for wheezing or shortness of breath. Reported on 10/03/2015    . apixaban (ELIQUIS) 5 MG TABS tablet Take 1 tablet (5 mg total) 2 (two) times daily by mouth. 60 tablet 0  . budesonide-formoterol (SYMBICORT) 160-4.5 MCG/ACT inhaler Inhale 2 puffs into the lungs 2 (two)  times daily. 1 Inhaler 0  . dextromethorphan (DELSYM) 30 MG/5ML liquid Take 5 mLs (30 mg total) by mouth 2 (two) times daily as needed for cough. 89 mL 0  . diphenhydrAMINE (BENADRYL) 25 MG tablet Take 25 mg by mouth every 6 (six) hours as needed for itching or allergies.     [name] docusate sodium (COLACE) 100 MG capsule Take 1 capsule (100 mg total) by mouth 2 (two) times daily. (Patient taking differently: Take 100 mg by mouth daily as needed. ) 10 capsule 0  . famotidine (PEPCID) 20 MG tablet TAKE (1) TABLET BY MOUTH AT BEDTIME. (Patient taking differently: Take 20 mg by mouth at bedtime. TAKE (1) TABLET BY MOUTH AT BEDTIME.) 30 tablet 0  . guaiFENesin (MUCINEX) 600 MG 12 hr tablet Take 600 mg by mouth 2 (two) times daily as needed for cough or to loosen phlegm.    . hydrOXYzine (ATARAX/VISTARIL) 25 MG tablet Take 1 tablet (25 mg total) by mouth at bedtime. 30 tablet 0  . ipratropium-albuterol (DUONEB) 0.5-2.5 (3) MG/3ML SOLN Take 3 mLs by nebulization every 4 (four) hours as needed. 360 mL 0  . Menthol-Methyl Salicylate (MUSCLE RUB) 10-15 % CREA Apply 1 application topically 4 (four) times daily. 85 g 0  . metoprolol tartrate (LOPRESSOR) 25 MG tablet Take 0.5 tablets (12.5 mg total) by mouth 2 (two) times daily. 30 tablet 3  . montelukast (SINGULAIR) 10 MG tablet TAKE (1) TABLET BY MOUTH AT BEDTIME. (Patient taking differently: Take 10 mg by mouth at bedtime. ) 30 tablet 1  . pantoprazole (PROTONIX) 40 MG tablet TAKE 1 TABLET BY MOUTH  30 TO 60 MINUTES PRIOR TO THE FIRST MEAL OF THE DAY. (Patient taking differently: Take 40 mg by mouth daily. ) 30 tablet 0  . potassium chloride (KLOR-CON) 20 MEQ tablet Take 1 tablet (20 mEq total) by mouth 2 (two) times daily. 60 tablet 1  . Tamsulosin HCl (FLOMAX) 0.4 MG CAPS Take 0.4 mg by mouth daily after supper.     . torsemide (DEMADEX) 20 MG tablet Take 2 tablets (40 mg total) by mouth daily. 60 tablet 2  . vitamin B-12 (CYANOCOBALAMIN) 500 MCG tablet Take 500  mcg by mouth in the morning and at bedtime.     No current facility-administered medications for this visit.    Past Medical History:  Diagnosis Date  . AAA (abdominal aortic aneurysm) ([employer])   . Anxiety    due to pain in leg  . Arthritis    Osteo  . Bell's palsy   . Cancer [facility])    Bladder cancer  . Cataracts, bilateral   . COPD (chronic obstructive pulmonary disease) ([facility])    Albuterol inhaler as needed  . Diverticulitis   . Enlarged prostate    takes Flomax daily  . GERD (gastroesophageal reflux disease)    was on Prilosec but has been off for yrs-no issues now with reflux  . Headache    migraines years ago  . History of bronchitis    > 89yrs ago  . History of colon polyps    benign  . History of dizziness    several yrs ago-was on Meclizine but has been off for several yrs-no problems since  . History of hiatal hernia   . Hypertension    takes Amlodipine and Lisinpril daily  . Joint pain   . Joint swelling   . Nocturia   . PAD (peripheral artery disease) (H[facility])     bilateral superficial femoral artery occlusions   . Paroxysmal atrial fibrillation ([facility])   . Pneumonia    several times with most recent within 5 yrs ago  . Restless leg syndrome   . Rupture of left quadriceps tendon   . Shortness of breath dyspnea    with exertion  . Sleep apnea    does not use cpap  . Urinary urgency     Past Surgical History:  Procedure Laterality Date  . BICEPS TENDON REPAIR     LEFT  . CARDIOVERSION N/A 02/06/2017   Procedure: CARDIOVERSION;  Surgeon: Jerline Pain, MD;  Location: [facility] ENDOSCOPY;  Service: Cardiovascular;  Laterality: N/A;  . CARPAL TUNNEL RELEASE Right 2014  . CHOLECYSTECTOMY  July 16,2010  . COLONOSCOPY    . CYSTOSCOPY    . ESOPHAGOGASTRODUODENOSCOPY    . FEMORAL-POPLITEAL BYPASS GRAFT Right 09/19/2015   Procedure: RIGHT COMMON FEMORAL-ABOVE KNEE POPLITEAL ARTERY    BYPASS GRAFT ;  Surgeon: Conrad Nauvoo, MD;  Location: [facility];  Service: Vascular;   Laterality: Right;  . FOOT SURGERY Right    x 5t  . HAND SURGERY Right   . HERNIA REPAIR Bilateral    inguinal  . PERIPHERAL VASCULAR CATHETERIZATION N/A 08/02/2015   Procedure: Abdominal Aortogram w/Lower Extremity;  Surgeon: Conrad Glen, MD;  Location: [facility] CV LAB;  Service: Cardiovascular;  Laterality: N/A;  . QUADRICEPS TENDON REPAIR Left 08/22/2016   Procedure: LEFT OPEN QUADRICEP TENDON REPAIR;  Surgeon: Netta Cedars, MD;  Location: [facility];  Service: Orthopedics;  Laterality: Left;  . REVERSE SHOULDER ARTHROPLASTY Right 07/07/2014   Procedure: RIGHT REVERSE TOTAL SHOULDER ;  Surgeon: Netta Cedars, MD;  Location: [facility];  Service: Orthopedics;  Laterality: Right;  . REVERSE TOTAL SHOULDER ARTHROPLASTY Right 07/07/2014   Dr Veverly Fells  . ROTATOR CUFF REPAIR     Bilateral   . TEE WITHOUT CARDIOVERSION N/A 02/06/2017   Procedure: TRANSESOPHAGEAL ECHOCARDIOGRAM (TEE);  Surgeon: Jerline Pain, MD;  Location: [facility] ENDOSCOPY;  Service: Cardiovascular;  Laterality: N/A;  . TONSILLECTOMY    . tumor removed from bladder    . VEIN HARVEST Right 09/19/2015   Procedure: WITH NON REVERSE RIGHT GREATER SAPHENOUS VEIN HARVEST;  Surgeon: Conrad Embden, MD;  Location: [facility];  Service: Vascular;  Laterality: Right;    Social History   Socioeconomic History  . Marital status: Married    Spouse name: Not on file  . Number of children: Not on file  . Years of education: Not on file  . Highest education level: Not on file  Occupational History  . Not on file  Tobacco Use  . Smoking status: Current Some Day Smoker    Packs/day: 1.00    Years: 45.00    Pack years: 45.00    Types: Cigars    Last attempt to quit: 01/07/2019    Years since quitting: 0.4  . Smokeless tobacco: Never Used  Substance and Sexual Activity  . Alcohol use: Yes    Alcohol/week: 0.0 standard drinks    Comment: wine occasionally  . Drug use: Not Currently    Types: Marijuana  . Sexual activity: Yes  Other Topics Concern    . Not on file  Social History Narrative   Pt lives w/ wife in [place], [place]   Social Determinants of Health   Financial Resource Strain:   . Difficulty of Paying Living Expenses:   Food Insecurity:   . Worried About Charity fundraiser in the Last Year:   . Arboriculturist in the Last Year:   Transportation Needs:   . Film/video editor (Medical):   [name] Lack of Transportation (Non-Medical):   Physical Activity:   . Days of Exercise per Week:   . Minutes of Exercise per Session:   Stress:   . Feeling of Stress :   Social Connections:   . Frequency of Communication with Friends and Family:   . Frequency of Social Gatherings with Friends and Family:   . Attends Religious Services:   . Active Member of Clubs or Organizations:   . Attends Archivist Meetings:   [name] Marital Status:   Intimate Partner Violence:   . Fear of Current or Ex-Partner:   . Emotionally Abused:   [name] Physically Abused:   . Sexually Abused:      Vitals:   06/30/19 1355  BP: 118/70  Pulse: 88  SpO2: 94%  Weight: 239 lb 6.4 oz (108.6 kg)  Height: 5\' 11"  (1.803 m)    Wt Readings from Last 3 Encounters:  06/30/19 239 lb 6.4 oz (108.6 kg)  06/18/19 239 lb 13.8 oz (108.8 kg)  06/14/19 255 lb (115.7 kg)     PHYSICAL EXAM General: NAD HEENT: Normal. Neck: No JVD, no thyromegaly. Lungs: Diffusely diminished breath sounds, no crackles or wheezes. CV: Regular rate and rhythm, normal S1/S2, no S3/S4, no murmur.  Tense, chronic 1+ bilateral leg edema.   Abdomen: Soft, nontender, obese.  Neurologic: Alert and oriented.  Psych: Normal affect. Skin: Normal. Musculoskeletal: No gross deformities.      Labs: Lab Results  Component Value Date/Time   K  4.1 06/19/2019 05:45 AM   BUN 22 06/19/2019 05:45 AM   CREATININE 1.01 06/19/2019 05:45 AM   ALT 38 06/19/2019 05:45 AM   TSH 0.60 05/18/2018 09:40 AM   HGB 10.6 (L) 06/18/2019 06:06 AM   HGB 9.8 (L) 03/03/2019 10:22 AM      Lipids: Lab Results  Component Value Date/Time   LDLCALC  06/29/2008 02:00 AM    99        Total Cholesterol/HDL:CHD Risk Coronary Heart Disease Risk Table                     Men   Women  1/2 Average Risk   3.4   3.3  Average Risk       5.0   4.4  2 X Average Risk   9.6   7.1  3 X Average Risk  23.4   11.0        Use the calculated Patient Ratio above and the CHD Risk Table to determine the patient's CHD Risk.        ATP III CLASSIFICATION (LDL):  <100     mg/dL   Optimal  100-129  mg/dL   Near or Above                    Optimal  130-159  mg/dL   Borderline  160-189  mg/dL   High  >190     mg/dL   Very High   CHOL  06/29/2008 02:00 AM    153        ATP III CLASSIFICATION:  <200     mg/dL   Desirable  200-239  mg/dL   Borderline High  >=240    mg/dL   High          TRIG 163 (H) 03/11/2019 02:59 PM   HDL 29 (L) 06/29/2008 02:00 AM       ASSESSMENT AND PLAN:  1.  Chronic diastolic heart failure: Currently on torsemide 40 mg daily.  Blood pressure is normal.  He weighs daily and put on 2 pounds since eating [place] food this past Tuesday.  He was celebrating his wife's birthday but otherwise tries to adhere to a low-sodium diet now.  I told him to take an extra 20 mg of torsemide today with an additional tablet of potassium. BUN 22, creatinine 1.01 on 06/19/2019. He has been instructed to take an extra 20 mg of torsemide for 3 pound weight gain in 24 hours along with additional supplemental potassium.  2. Paroxysmal atrial fibrillation:Symptomatically stable. Anticoagulated with apixaban.Currently on Lopressor 12.5 mg twice daily.No changes to therapy.  3. Hypertension: Controlled on present therapy. No changes.  4. Peripheral arterial disease: Not on aspirin as he is on Eliquis. He would benefit from statin therapy. He has been having bilateral leg pain. I have recommended he make a follow-up appointment with vascular surgery as he likely needs ABIs.  5.  Coronary artery disease: This is nonobstructive with coronary CT angiography results detailed above.   Disposition: Follow up 3 months   Kate Sable, M.D., F.A.C.C.

## 2019-07-04 ENCOUNTER — Ambulatory Visit: Payer: PPO | Admitting: Internal Medicine

## 2019-07-04 ENCOUNTER — Other Ambulatory Visit: Payer: Self-pay

## 2019-07-04 ENCOUNTER — Encounter: Payer: Self-pay | Admitting: Internal Medicine

## 2019-07-04 VITALS — BP 150/75 | HR 88 | Temp 98.2°F | Ht 71.0 in | Wt 238.2 lb

## 2019-07-04 DIAGNOSIS — J441 Chronic obstructive pulmonary disease with (acute) exacerbation: Secondary | ICD-10-CM | POA: Diagnosis not present

## 2019-07-04 DIAGNOSIS — G4733 Obstructive sleep apnea (adult) (pediatric): Secondary | ICD-10-CM

## 2019-07-04 DIAGNOSIS — J410 Simple chronic bronchitis: Secondary | ICD-10-CM | POA: Diagnosis not present

## 2019-07-04 NOTE — Progress Notes (Signed)
Derrick Moon    [id number]    1940-08-12  Primary Care Physician:Fagan, Carloyn Manner, MD Date of Appointment: 07/04/2019 Established Patient Visit  Chief complaint:   Chief Complaint  Patient presents with  . Follow-up    hospital f/u 06/19/19.  Sob.  Needs new sleep study.  sob most of the time with any movement.  Wearing 2.5 L oxygen.  Congestion, especially in the am.  phlem thick yellow.      HPI: Previous patient of Dr. Melvyn Novas. Re-establishing care with me for COPD. Recently hospitalized for heart failure  Interval Updates: Discharged from the hospital at [facility] for heart failure. Diuresed 18 lbs. Swelling and tightness and breathing improved.   For his COPD is on Albuterol, symbicort, singulair. Albuterol seems to help him the most as far as his inhalers are concerned.  Was on Eliquis for Atrial Fibrillation and on metoprolol for rate control. Still has anemia and his gastroenterologist would like for him to have colonoscopy to evaluate this. Was cancelled previously due to respiratory status.  OBSTRUCTIVE SLEEP APNEA SCREENING  1.  Snoring?:  yes 2.  Tired?:  yes 3.  Observed apnea, stop breathing or choking/gasping during sleep?:  yes 4.  Pressure. HTN history?  yes 5.  BMI more than 35 kg/m2?  no 6.  Age more than 61 yrs?  yes 7.  Neck size larger than 17 in for male or 16 in for male?  yes 8.  Gender = Male?  yes  Total:  7  For general population  OSA - Low Risk : Yes to 0 - 2 questions OSA - Intermediate Risk : Yes to 3 - 4 questions OSA - High Risk : Yes to 5 - 8 questions  or Yes to 2 or more of 4 STOP questions + male gender or Yes to 2 or more of 4 STOP questions + BMI > 35kg/m2  or Yes to 2 or more of 4 STOP questions + neck circumference 17 inches / 43cm in male or 16 inches / 41cm in male  References: Rinaldo Cloud al. Anesthesiology 2008; 108: 812-821,  Gabriel Cirri et al Br Carlis Hoyer 2012; 108: [id number],  Gabriel Cirri et al J Clin Sleep Med Sept  2014.  Body Mass Index, Obstruction, Dyspnea, Exercise Capacity (BODE) Index  FEV1 (% predicted)  ? 65: 0  50-64: 1 36-49: 2 ? 35: 3 6-Minute Walk Test (meters)  ? 350: 0 250-349:1  150-249: 2 ? 149: 3 MMRC Dyspnea Scale 0 1 2 3 4   Body Mass Index  >21: 0 ? 21: 1   4 Year Survival Rates  0-2 points - 80%   3-4 points - 67%   5-6 points - 57%   7-10 points - 18%  References  . Celli BR, Cote CG, Marin JM, et. al. The body-mass index, airflow obstruction, dyspnea and exercise capacity index in chronic obstructive pulmonary disease. N Engl J Med. 2004 Mar 4;350(10):1005-12. Lianne Bushy DA, Wells CK. Evaluation of clinical methods for rating dyspnea. Chest. 1988 Mar;93(3):580-6.   I have reviewed the patient's family social and past medical history and updated as appropriate.   Past Medical History:  Diagnosis Date  . AAA (abdominal aortic aneurysm) ([place])   . Anxiety    due to pain in leg  . Arthritis    Osteo  . Bell's palsy   . Cancer [facility])    Bladder cancer  . Cataracts, bilateral   .  COPD (chronic obstructive pulmonary disease) (H[facility])    Albuterol inhaler as needed  . Diverticulitis   . Enlarged prostate    takes Flomax daily  . GERD (gastroesophageal reflux disease)    was on Prilosec but has been off for yrs-no issues now with reflux  . Headache    migraines years ago  . History of bronchitis    > 42yrs ago  . History of colon polyps    benign  . History of dizziness    several yrs ago-was on Meclizine but has been off for several yrs-no problems since  . History of hiatal hernia   . Hypertension    takes Amlodipine and Lisinpril daily  . Joint pain   . Joint swelling   . Nocturia   . PAD (peripheral artery disease) (HCC)     bilateral superficial femoral artery occlusions   . Paroxysmal atrial fibrillation (HCC)   . Pneumonia    several times with most recent within 5 yrs ago  . Restless leg syndrome   . Rupture of left quadriceps tendon   .  Shortness of breath dyspnea    with exertion  . Sleep apnea    does not use cpap  . Urinary urgency     Past Surgical History:  Procedure Laterality Date  . BICEPS TENDON REPAIR     LEFT  . CARDIOVERSION N/A 02/06/2017   Procedure: CARDIOVERSION;  Surgeon: Jerline Pain, MD;  Location[facility] ENDOSCOPY;  Service: Cardiovascular;  Laterality: N/A;  . CARPAL TUNNEL RELEASE Right 2014  . CHOLECYSTECTOMY  July 16,2010  . COLONOSCOPY    . CYSTOSCOPY    . ESOPHAGOGASTRODUODENOSCOPY    . FEMORAL-POPLITEAL BYPASS GRAFT Right 09/19/2015   Procedure: RIGHT COMMON FEMORAL-ABOVE KNEE POPLITEAL ARTERY    BYPASS GRAFT ;  Surgeon: Conrad [place], MD;  Location: [place];  Service: Vascular;  Laterality: Right;  . FOOT SURGERY Right    x 5t  . HAND SURGERY Right   . HERNIA REPAIR Bilateral    inguinal  . PERIPHERAL VASCULAR CATHETERIZATION N/A 08/02/2015   Procedure: Abdominal Aortogram w/Lower Extremity;  Surgeon: Conrad [place], MD;  Location: [place] CV LAB;  Service: Cardiovascular;  Laterality: N/A;  . QUADRICEPS TENDON REPAIR Left 08/22/2016   Procedure: LEFT OPEN QUADRICEP TENDON REPAIR;  Surgeon: Netta Cedars, MD;  Location: [place];  Service: Orthopedics;  Laterality: Left;  . REVERSE SHOULDER ARTHROPLASTY Right 07/07/2014   Procedure: RIGHT REVERSE TOTAL SHOULDER ;  Surgeon: Netta Cedars, MD;  Location: [place];  Service: Orthopedics;  Laterality: Right;  . REVERSE TOTAL SHOULDER ARTHROPLASTY Right 07/07/2014   Dr Veverly Fells  . ROTATOR CUFF REPAIR     Bilateral   . TEE WITHOUT CARDIOVERSION N/A 02/06/2017   Procedure: TRANSESOPHAGEAL ECHOCARDIOGRAM (TEE);  Surgeon: Jerline Pain, MD;  Location: [facility] ENDOSCOPY;  Service: Cardiovascular;  Laterality: N/A;  . TONSILLECTOMY    . tumor removed from bladder    . VEIN HARVEST Right 09/19/2015   Procedure: WITH NON REVERSE RIGHT GREATER SAPHENOUS VEIN HARVEST;  Surgeon: Conrad , MD;  Location: [place];  Service: Vascular;  Laterality: Right;    Family  History  Problem Relation Age of Onset  . Parkinson's disease Mother   . AAA (abdominal aortic aneurysm) Father   . Heart defect Sister        Congenital Heart Disease  . Heart disease Sister     Social History   Occupational History  . Not on  file  Tobacco Use  . Smoking status: Current Some Day Smoker    Packs/day: 1.00    Years: 45.00    Pack years: 45.00    Types: Cigars    Last attempt to quit: 01/07/2019    Years since quitting: 0.4  . Smokeless tobacco: Never Used  Substance and Sexual Activity  . Alcohol use: Yes    Alcohol/week: 0.0 standard drinks    Comment: wine occasionally  . Drug use: Not Currently    Types: Marijuana  . Sexual activity: Yes    Review of systems: Constitutional: Chills CV: No chest pain, or palpitations. Resp: No hemoptysis.  Physical Exam: Blood pressure (!) 150/75, pulse 88, temperature 98.2 F (36.8 C), height 5\' 11"  (1.803 m), weight 238 lb 3.2 oz (108 kg), SpO2 98 %.   Gen: cushingoid, in wheel chair Resp: diminished bilaterally CV: diminished, RRR Ext: minimal edema. Thin skin.    Data Reviewed: Imaging: I have personally reviewed the chest xray March 2021 reveals no acute cardiopulmonary process  Echocardiogram Dec 2020 1. Left ventricular ejection fraction, by visual estimation, is 60 to  65%. The left ventricle has normal function. There is borderline left  ventricular hypertrophy.  2. Left ventricular diastolic parameters are indeterminate.  3. The left ventricle has no regional wall motion abnormalities.  4. Global right ventricle has normal systolic function.The right  ventricular size is normal. No increase in right ventricular wall  thickness.  5. Left atrial size was normal.  6. Right atrial size was normal.  7. Presence of pericardial fat pad.  8. Trivial pericardial effusion is present.  9. The mitral valve is normal in structure. Trivial mitral valve  regurgitation.  10. The tricuspid valve is  normal in structure. Tricuspid valve  regurgitation is trivial.  11. The aortic valve is normal in structure. Aortic valve regurgitation is  not visualized. Mild aortic valve sclerosis without stenosis.  12. The pulmonic valve was not well visualized. Pulmonic valve  regurgitation is not visualized.  13. The atrial septum is grossly normal.   PFTs:  PFT Results Latest Ref Rng & Units 03/12/2018  FVC-Pre L 3.08  FVC-Predicted Pre % 72  FVC-Post L 3.66  FVC-Predicted Post % 85  Pre FEV1/FVC % % 59  Post FEV1/FCV % % 56  FEV1-Pre L 1.81  FEV1-Predicted Pre % 58  FEV1-Post L 2.05  DLCO UNC% % 42  DLCO COR %Predicted % 50  TLC L 7.95  TLC % Predicted % 109  RV % Predicted % 178   I have personally reviewed the patient's PFTs and my interpretation is moderately severe airflow limitation with borderline BD response consistent with COPD. FEV1 58% of predicted  Labs: A1AT within normal range  Immunization status: Immunization History  Administered Date(s) Administered  . Influenza, High Dose Seasonal PF 12/12/2015, 01/08/2017, 12/22/2017, 02/22/2019  . Moderna SARS-COVID-2 Vaccination 04/26/2019  . PFIZER SARS-COV-2 Vaccination 06/03/2019  . Tdap 08/15/2016    Assessment:  Moderately Severe COPD FEV1 58% of predicted with acute exacerbation Chronic Hypoxemic Respiratory Failure on 2.5LNC Obstructive Sleep Apnea Pre-operative Clearance  Plan/Recommendations: Mr. Mcclave is very symptomatic and is Gold stage D with regards to his symptoms. His heart failure is likely contributing to his symptoms. However much improved since last visit and being diuresed.  I calculate his BODE score and he has an estimated 4-year survival of 18% based on symptoms.  He is intermediate risk for post-procedure respiratory failure based on his current status.  Ultimately  I would prefer he resume his nocturnal CPAP or BIPAP for sleep apnea prior to proceeding with any elective procedures.    Preoperative Risk Calculation: The features of this patient's history that contribute to the pulmonary risk assessment include: Age, COPD, General anesthesia, throacic surgery, surgery >2 hours.  This patient has an intermediate risk of post-operative pulmonary complications by ARISCAT Index.  The absolute assessment of risk/benefit of the procedure is deferred to the primary team's evaluation.  - Patient's Estimated risk of postoperative respiratory failure is Intermediate based on the ARISCAT Index.   0 to 25 points: Low risk: [id number] pulmonary complication rate  26 to 44 points: Intermediate risk: 0000000 pulmonary complication rate  45 to 123 points: High risk: [id number] pulmonary complication rate  Postoperative respiratory failure (PRF) is considered as failure to wean from mechanical ventilation within 48 hours of surgery or unplanned intubation/reintubation postoperatively. The validated risk calculator provides a risk estimate of PRF and is anticipated to aid in surgical decision-making and informed patient consent.  However risk can be accepted given the potential benefit of this intervention and it is not prohibitive.  RECOMMENDATIONS:  In order to minimize the risk of complications and optimize pulmonary status, we recommend the following: - Encourage aggressive incentive spirometry hourly both peri-operatively and post-operatively as tolerated  - Early ambulation and physical therapy as tolerated post-operatively - Adequate pain control especially in the setting of abdominal and thoracic surgery - Bronchodilators as needed for wheezing or shortness of breath - Oral steroids only if the patient appears to have component of COPD exacerbation, otherwise no routine utilization needed - Ideally we recommend smoking cessation for >4 weeks before any intervention - Intraoperatively keep OR time to the shortest as possible - Post operatively may benefit from Nocturnal Bipap   ARISCAT: Mazo  et al. Anesthesiology 2014; [phone]   Return to Care: Return in about 3 months (around 10/03/2019).   He will follow up with [facility] for OSA and then again with me.   Lenice Llamas, MD Pulmonary and [place]

## 2019-07-04 NOTE — Patient Instructions (Addendum)
The patient should have follow up scheduled with [facility] in 3 months to follow up on sleep study. He should be started on treatment for sleep apnea once these results are back.    Prior to next visit patient should have: Split night sleep study.   I would prefer you wait until after we start treatment with CPAP for your sleep apnea before proceeding with a colonoscopy.

## 2019-07-05 ENCOUNTER — Other Ambulatory Visit (HOSPITAL_BASED_OUTPATIENT_CLINIC_OR_DEPARTMENT_OTHER): Payer: Self-pay

## 2019-07-15 ENCOUNTER — Other Ambulatory Visit: Payer: Self-pay

## 2019-07-15 ENCOUNTER — Other Ambulatory Visit (HOSPITAL_COMMUNITY)
Admission: RE | Admit: 2019-07-15 | Discharge: 2019-07-15 | Disposition: A | Discharge status: Home / Self Care | Payer: PPO | Source: Ambulatory Visit | Attending: Pulmonary Disease | Admitting: Pulmonary Disease

## 2019-07-15 DIAGNOSIS — Z01812 Encounter for preprocedural laboratory examination: Secondary | ICD-10-CM | POA: Diagnosis not present

## 2019-07-15 DIAGNOSIS — Z20822 Contact with and (suspected) exposure to covid-19: Secondary | ICD-10-CM | POA: Insufficient documentation

## 2019-07-16 LAB — SARS CORONAVIRUS 2 (TAT 6-24 HRS): SARS Coronavirus 2: NEGATIVE

## 2019-07-18 ENCOUNTER — Other Ambulatory Visit: Payer: Self-pay

## 2019-07-18 ENCOUNTER — Ambulatory Visit: Payer: PPO | Attending: Internal Medicine | Admitting: Pulmonary Disease

## 2019-07-18 DIAGNOSIS — J449 Chronic obstructive pulmonary disease, unspecified: Secondary | ICD-10-CM | POA: Insufficient documentation

## 2019-07-18 DIAGNOSIS — G4733 Obstructive sleep apnea (adult) (pediatric): Secondary | ICD-10-CM

## 2019-07-18 DIAGNOSIS — R0683 Snoring: Secondary | ICD-10-CM | POA: Insufficient documentation

## 2019-07-18 DIAGNOSIS — Z9981 Dependence on supplemental oxygen: Secondary | ICD-10-CM | POA: Insufficient documentation

## 2019-07-22 DIAGNOSIS — Z79899 Other long term (current) drug therapy: Secondary | ICD-10-CM | POA: Diagnosis not present

## 2019-07-22 DIAGNOSIS — J449 Chronic obstructive pulmonary disease, unspecified: Secondary | ICD-10-CM | POA: Diagnosis not present

## 2019-07-22 DIAGNOSIS — R600 Localized edema: Secondary | ICD-10-CM | POA: Diagnosis not present

## 2019-07-22 DIAGNOSIS — D649 Anemia, unspecified: Secondary | ICD-10-CM | POA: Diagnosis not present

## 2019-07-23 DIAGNOSIS — I110 Hypertensive heart disease with heart failure: Secondary | ICD-10-CM | POA: Diagnosis not present

## 2019-07-23 DIAGNOSIS — J9601 Acute respiratory failure with hypoxia: Secondary | ICD-10-CM | POA: Diagnosis not present

## 2019-07-23 DIAGNOSIS — I5033 Acute on chronic diastolic (congestive) heart failure: Secondary | ICD-10-CM | POA: Diagnosis not present

## 2019-07-23 DIAGNOSIS — C679 Malignant neoplasm of bladder, unspecified: Secondary | ICD-10-CM | POA: Diagnosis not present

## 2019-07-23 DIAGNOSIS — M1990 Unspecified osteoarthritis, unspecified site: Secondary | ICD-10-CM | POA: Diagnosis not present

## 2019-07-23 DIAGNOSIS — I872 Venous insufficiency (chronic) (peripheral): Secondary | ICD-10-CM | POA: Diagnosis not present

## 2019-07-23 DIAGNOSIS — J441 Chronic obstructive pulmonary disease with (acute) exacerbation: Secondary | ICD-10-CM | POA: Diagnosis not present

## 2019-07-23 DIAGNOSIS — K219 Gastro-esophageal reflux disease without esophagitis: Secondary | ICD-10-CM | POA: Diagnosis not present

## 2019-07-23 DIAGNOSIS — I739 Peripheral vascular disease, unspecified: Secondary | ICD-10-CM | POA: Diagnosis not present

## 2019-07-23 DIAGNOSIS — G510 Bell's palsy: Secondary | ICD-10-CM | POA: Diagnosis not present

## 2019-07-23 DIAGNOSIS — N401 Enlarged prostate with lower urinary tract symptoms: Secondary | ICD-10-CM | POA: Diagnosis not present

## 2019-07-23 DIAGNOSIS — I480 Paroxysmal atrial fibrillation: Secondary | ICD-10-CM | POA: Diagnosis not present

## 2019-07-23 DIAGNOSIS — R3915 Urgency of urination: Secondary | ICD-10-CM | POA: Diagnosis not present

## 2019-07-26 NOTE — Procedures (Signed)
    Patient Name: Derrick Moon, Derrick Moon Date: 07/18/2019 Gender: Male D.O.B: February 01, 1941 Age (years): 79 Referring Provider: Lenice Llamas MD Height (inches): 2 Interpreting Physician: Chesley Mires MD, ABSM Weight (lbs): 238 RPSGT: Rosebud Poles BMI: 33 MRN: [id number] Neck Size: 21.00  CLINICAL INFORMATION Sleep Study Type: NPSG  Indication for sleep study: 79 yr old male with snoring, sleep disruption.  She has history of COPD and uses 2.5 liters oxygen.  Presents for assessment of obstructive sleep apnea.  Epworth Sleepiness Score: 13  SLEEP STUDY TECHNIQUE As per the AASM Manual for the Scoring of Sleep and Associated Events v2.3 (April 2016) with a hypopnea requiring 4% desaturations.  The channels recorded and monitored were frontal, central and occipital EEG, electrooculogram (EOG), submentalis EMG (chin), nasal and oral airflow, thoracic and abdominal wall motion, anterior tibialis EMG, snore microphone, electrocardiogram, and pulse oximetry.  MEDICATIONS Medications self-administered by patient taken the night of the study : N/A  SLEEP ARCHITECTURE The study was initiated at 10:54:29 PM and ended at 4:11:18 AM.  Sleep onset time was 95.3 minutes and the sleep efficiency was 9.9%%. The total sleep time was 31.5 minutes.  Stage REM latency was N/A minutes.  The patient spent 33.3%% of the night in stage N1 sleep, 66.7%% in stage N2 sleep, 0.0%% in stage N3 and 0% in REM.  Alpha intrusion was absent.  Supine sleep was 73.02%.  RESPIRATORY PARAMETERS The overall apnea/hypopnea index (AHI) was 21.0 per hour. There were 2 total apneas, including 2 obstructive, 0 central and 0 mixed apneas. There were 9 hypopneas and 0 RERAs.  The AHI during Stage REM sleep was N/A per hour.  AHI while supine was 18.3 per hour.  The mean oxygen saturation was 96.1%. The minimum SpO2 during sleep was 91.0%.  She wore 2.5 liters supplemental oxygen during this study.  moderate  snoring was noted during this study.  CARDIAC DATA The 2 lead EKG demonstrated sinus rhythm. The mean heart rate was 78.9 beats per minute. Other EKG findings include: PVCs.  LEG MOVEMENT DATA The total PLMS were 0 with a resulting PLMS index of 0.0. Associated arousal with leg movement index was 45.7 .  IMPRESSIONS - She had difficulty initiating and maintaining sleep.  She slept for only 31.5 minutes of test time.  As such, she did not have sufficient sleep time to make a determination about whether she has significant obstructive sleep apnea. - Moderate snoring noted when she was asleep.  DIAGNOSIS - Snoring.  RECOMMENDATIONS - If there is still significant concern for obstructive sleep apnea, then recommend repeating in lab sleep study and have her use a sleep aide medication such as trazodone 50 to 100 mg on the night of the study.  [Electronically signed] 07/26/2019 02:31 PM  Chesley Mires MD, [place], American Board of Sleep Medicine   NPI: [id number]

## 2019-07-29 DIAGNOSIS — R609 Edema, unspecified: Secondary | ICD-10-CM | POA: Diagnosis not present

## 2019-07-29 DIAGNOSIS — Z6832 Body mass index (BMI) 32.0-32.9, adult: Secondary | ICD-10-CM | POA: Diagnosis not present

## 2019-07-29 DIAGNOSIS — D509 Iron deficiency anemia, unspecified: Secondary | ICD-10-CM | POA: Diagnosis not present

## 2019-07-29 DIAGNOSIS — J449 Chronic obstructive pulmonary disease, unspecified: Secondary | ICD-10-CM | POA: Diagnosis not present

## 2019-08-02 ENCOUNTER — Telehealth: Payer: Self-pay | Admitting: Internal Medicine

## 2019-08-02 NOTE — Telephone Encounter (Signed)
Ok. Will discuss this at this follow-up. Thank you for the heads up

## 2019-08-02 NOTE — Telephone Encounter (Signed)
Please call let the patient know he did not qualify for CPAP based on the results of the sleep study.  We will discuss next steps further at his follow up appt in July.   Beth - you are seeing him in follow up in July.  Based on spirometry we can get him approved for trilogy vent if he is open to the idea. I had ordered him a POC but got a letter from [facility] saying that since he is a mouth breather, continuous oxygen better for him rather than POC.

## 2019-08-19 DIAGNOSIS — Z79899 Other long term (current) drug therapy: Secondary | ICD-10-CM | POA: Diagnosis not present

## 2019-08-19 DIAGNOSIS — J449 Chronic obstructive pulmonary disease, unspecified: Secondary | ICD-10-CM | POA: Diagnosis not present

## 2019-08-19 DIAGNOSIS — D509 Iron deficiency anemia, unspecified: Secondary | ICD-10-CM | POA: Diagnosis not present

## 2019-08-26 DIAGNOSIS — J449 Chronic obstructive pulmonary disease, unspecified: Secondary | ICD-10-CM | POA: Diagnosis not present

## 2019-08-26 DIAGNOSIS — I10 Essential (primary) hypertension: Secondary | ICD-10-CM | POA: Diagnosis not present

## 2019-08-26 DIAGNOSIS — D509 Iron deficiency anemia, unspecified: Secondary | ICD-10-CM | POA: Diagnosis not present

## 2019-08-26 DIAGNOSIS — I5032 Chronic diastolic (congestive) heart failure: Secondary | ICD-10-CM | POA: Diagnosis not present

## 2019-10-03 ENCOUNTER — Ambulatory Visit: Payer: PPO | Admitting: Family Medicine

## 2019-10-04 ENCOUNTER — Ambulatory Visit: Payer: PPO | Admitting: Primary Care

## 2019-10-07 DIAGNOSIS — Z79899 Other long term (current) drug therapy: Secondary | ICD-10-CM | POA: Diagnosis not present

## 2019-10-07 DIAGNOSIS — D509 Iron deficiency anemia, unspecified: Secondary | ICD-10-CM | POA: Diagnosis not present

## 2019-10-07 DIAGNOSIS — I5032 Chronic diastolic (congestive) heart failure: Secondary | ICD-10-CM | POA: Diagnosis not present

## 2019-10-07 DIAGNOSIS — J449 Chronic obstructive pulmonary disease, unspecified: Secondary | ICD-10-CM | POA: Diagnosis not present

## 2019-10-14 DIAGNOSIS — J449 Chronic obstructive pulmonary disease, unspecified: Secondary | ICD-10-CM | POA: Diagnosis not present

## 2019-10-14 DIAGNOSIS — I10 Essential (primary) hypertension: Secondary | ICD-10-CM | POA: Diagnosis not present

## 2019-10-14 DIAGNOSIS — D509 Iron deficiency anemia, unspecified: Secondary | ICD-10-CM | POA: Diagnosis not present

## 2019-10-14 DIAGNOSIS — I480 Paroxysmal atrial fibrillation: Secondary | ICD-10-CM | POA: Diagnosis not present

## 2019-11-03 DIAGNOSIS — R609 Edema, unspecified: Secondary | ICD-10-CM | POA: Diagnosis not present

## 2019-11-03 DIAGNOSIS — I10 Essential (primary) hypertension: Secondary | ICD-10-CM | POA: Diagnosis not present

## 2019-11-09 DIAGNOSIS — D509 Iron deficiency anemia, unspecified: Secondary | ICD-10-CM | POA: Diagnosis not present

## 2019-11-09 DIAGNOSIS — Z79899 Other long term (current) drug therapy: Secondary | ICD-10-CM | POA: Diagnosis not present

## 2019-11-09 DIAGNOSIS — J9611 Chronic respiratory failure with hypoxia: Secondary | ICD-10-CM | POA: Diagnosis not present

## 2019-11-09 DIAGNOSIS — I480 Paroxysmal atrial fibrillation: Secondary | ICD-10-CM | POA: Diagnosis not present

## 2019-12-27 ENCOUNTER — Encounter: Payer: Self-pay | Admitting: Internal Medicine

## 2019-12-27 ENCOUNTER — Ambulatory Visit (INDEPENDENT_AMBULATORY_CARE_PROVIDER_SITE_OTHER): Payer: PPO | Admitting: Internal Medicine

## 2019-12-27 ENCOUNTER — Other Ambulatory Visit: Payer: Self-pay

## 2019-12-27 VITALS — BP 128/86 | HR 86 | Temp 98.6°F | Ht 71.0 in | Wt 226.4 lb

## 2019-12-27 DIAGNOSIS — J449 Chronic obstructive pulmonary disease, unspecified: Secondary | ICD-10-CM | POA: Diagnosis not present

## 2019-12-27 DIAGNOSIS — Z23 Encounter for immunization: Secondary | ICD-10-CM | POA: Diagnosis not present

## 2019-12-27 MED ORDER — PREDNISONE 20 MG PO TABS: 40.0000 mg | ORAL_TABLET | Freq: Every day | ORAL | 0 refills | Status: AC

## 2019-12-27 NOTE — Progress Notes (Signed)
Derrick Moon    [id number]    [birth year]  Primary Care Physician:Fagan, Carloyn Manner, MD Date of Appointment: 12/27/2019 Established Patient Visit  Chief complaint:   Chief Complaint  Patient presents with  . Follow-up    shortness of breath with exertion    HPI: Derrick Moon is a 79 y.o. gentleman with CHF and COPD.   Interval Updates: Here for follow up. No interval hospitalizations, ED visits but had exacerbations requiring prednisone once a month from Dr. Willey Blade. Wants to know if he can have a prescription of prednisone to keep at home for emergencies in case it hits him on a weekend or it's hard to get a hold of someone.  Asking about inogen. He wants to be more active and less breathless.  For his COPD is on Albuterol, symbicort, singulair. Albuterol seems to help him the most as far as his inhalers are concerned.   I have reviewed the patient's family social and past medical history and updated as appropriate.   Past Medical History:  Diagnosis Date  . AAA (abdominal aortic aneurysm) ([place])   . Anxiety    due to pain in leg  . Arthritis    Osteo  . Bell's palsy   . Cancer [facility])    Bladder cancer  . Cataracts, bilateral   . COPD (chronic obstructive pulmonary disease) ([facility])    Albuterol inhaler as needed  . Diverticulitis   . Enlarged prostate    takes Flomax daily  . GERD (gastroesophageal reflux disease)    was on Prilosec but has been off for yrs-no issues now with reflux  . Headache    migraines years ago  . History of bronchitis    > 9yrs ago  . History of colon polyps    benign  . History of dizziness    several yrs ago-was on Meclizine but has been off for several yrs-no problems since  . History of hiatal hernia   . Hypertension    takes Amlodipine and Lisinpril daily  . Joint pain   . Joint swelling   . Nocturia   . PAD (peripheral artery disease) (H[facility])     bilateral superficial femoral artery occlusions   . Paroxysmal atrial  fibrillation (H[facility])   . Pneumonia    several times with most recent within 5 yrs ago  . Restless leg syndrome   . Rupture of left quadriceps tendon   . Shortness of breath dyspnea    with exertion  . Sleep apnea    does not use cpap  . Urinary urgency     Past Surgical History:  Procedure Laterality Date  . BICEPS TENDON REPAIR     LEFT  . CARDIOVERSION N/A 02/06/2017   Procedure: CARDIOVERSION;  Surgeon: Jerline Pain, MD;  Location: [facility] ENDOSCOPY;  Service: Cardiovascular;  Laterality: N/A;  . CARPAL TUNNEL RELEASE Right 2014  . CHOLECYSTECTOMY  July 16,2010  . COLONOSCOPY    . CYSTOSCOPY    . ESOPHAGOGASTRODUODENOSCOPY    . FEMORAL-POPLITEAL BYPASS GRAFT Right 09/19/2015   Procedure: RIGHT COMMON FEMORAL-ABOVE KNEE POPLITEAL ARTERY    BYPASS GRAFT ;  Surgeon: Conrad [place], MD;  Location: [place];  Service: Vascular;  Laterality: Right;  . FOOT SURGERY Right    x 5t  . HAND SURGERY Right   . HERNIA REPAIR Bilateral    inguinal  . PERIPHERAL VASCULAR CATHETERIZATION N/A 08/02/2015   Procedure: Abdominal Aortogram w/Lower Extremity;  Surgeon: Conrad [place], MD;  Location: [place] CV LAB;  Service: Cardiovascular;  Laterality: N/A;  . QUADRICEPS TENDON REPAIR Left 08/22/2016   Procedure: LEFT OPEN QUADRICEP TENDON REPAIR;  Surgeon: Netta Cedars, MD;  Location: [place];  Service: Orthopedics;  Laterality: Left;  . REVERSE SHOULDER ARTHROPLASTY Right 07/07/2014   Procedure: RIGHT REVERSE TOTAL SHOULDER ;  Surgeon: Netta Cedars, MD;  Location: [place];  Service: Orthopedics;  Laterality: Right;  . REVERSE TOTAL SHOULDER ARTHROPLASTY Right 07/07/2014   Dr Veverly Fells  . ROTATOR CUFF REPAIR     Bilateral   . TEE WITHOUT CARDIOVERSION N/A 02/06/2017   Procedure: TRANSESOPHAGEAL ECHOCARDIOGRAM (TEE);  Surgeon: Jerline Pain, MD;  Location: [facility] ENDOSCOPY;  Service: Cardiovascular;  Laterality: N/A;  . TONSILLECTOMY    . tumor removed from bladder    . VEIN HARVEST Right 09/19/2015   Procedure:  WITH NON REVERSE RIGHT GREATER SAPHENOUS VEIN HARVEST;  Surgeon: Conrad [place], MD;  Location: [place];  Service: Vascular;  Laterality: Right;    Family History  Problem Relation Age of Onset  . Parkinson's disease Mother   . AAA (abdominal aortic aneurysm) Father   . Heart defect Sister        Congenital Heart Disease  . Heart disease Sister     Social History   Occupational History  . Not on file  Tobacco Use  . Smoking status: Current Some Day Smoker    Packs/day: 1.00    Years: 45.00    Pack years: 45.00    Types: Cigars    Last attempt to quit: 01/07/2019    Years since quitting: 0.9  . Smokeless tobacco: Never Used  . Tobacco comment: smokes 1/3 of a cigar a day- 12/27/2019  Vaping Use  . Vaping Use: Never used  Substance and Sexual Activity  . Alcohol use: Yes    Alcohol/week: 0.0 standard drinks    Comment: wine occasionally  . Drug use: Not Currently    Types: Marijuana  . Sexual activity: Yes    Physical Exam: Blood pressure 128/86, pulse 86, temperature 98.6 F (37 C), temperature source Other (Comment), height 5\' 11"  (1.803 m), weight 226 lb 6.4 oz (102.7 kg), SpO2 98 %.   Gen: cushingoid, in wheel chair Resp: diminished bilaterally, tachypnic CV: diminished, RRR Ext: minimal edema. Thin skin.    Data Reviewed: Imaging: I have personally reviewed the chest xray March 2021 reveals no acute cardiopulmonary process  Echocardiogram Dec 2020 1. Left ventricular ejection fraction, by visual estimation, is 60 to  65%. The left ventricle has normal function. There is borderline left  ventricular hypertrophy.  2. Left ventricular diastolic parameters are indeterminate.  3. The left ventricle has no regional wall motion abnormalities.  4. Global right ventricle has normal systolic function.The right  ventricular size is normal. No increase in right ventricular wall  thickness.  5. Left atrial size was normal.  6. Right atrial size was normal.  7.  Presence of pericardial fat pad.  8. Trivial pericardial effusion is present.  9. The mitral valve is normal in structure. Trivial mitral valve  regurgitation.  10. The tricuspid valve is normal in structure. Tricuspid valve  regurgitation is trivial.  11. The aortic valve is normal in structure. Aortic valve regurgitation is  not visualized. Mild aortic valve sclerosis without stenosis.  12. The pulmonic valve was not well visualized. Pulmonic valve  regurgitation is not visualized.  13. The atrial septum is grossly normal.  PFTs:  PFT Results Latest Ref Rng & Units 03/12/2018  FVC-Pre L 3.08  FVC-Predicted Pre % 72  FVC-Post L 3.66  FVC-Predicted Post % 85  Pre FEV1/FVC % % 59  Post FEV1/FCV % % 56  FEV1-Pre L 1.81  FEV1-Predicted Pre % 58  FEV1-Post L 2.05  DLCO uncorrected ml/min/mmHg 14.19  DLCO UNC% % 42  DLVA Predicted % 50  TLC L 7.95  TLC % Predicted % 109  RV % Predicted % 178   I have personally reviewed the patient's PFTs and my interpretation is moderately severe airflow limitation with borderline BD response consistent with COPD. FEV1 58% of predicted  Labs: A1AT within normal range  Immunization status: Immunization History  Administered Date(s) Administered  . Influenza, High Dose Seasonal PF 12/12/2015, 01/08/2017, 12/22/2017, 02/22/2019  . Moderna SARS-COVID-2 Vaccination 05/06/2019, 06/03/2019  . Tdap 08/15/2016    Assessment:  Moderately Severe COPD FEV1 58% of predicted with acute exacerbation Chronic Hypoxemic Respiratory Failure on 2.5LNC HFpEF  Plan/Recommendations: Mr. Galicia is very symptomatic and is Gold stage D with regards to his symptoms. His heart failure is likely contributing to his symptoms. Continue current inhaler therapy.   Refer to pulmonary rehab at [facility]  Will walk him for oxygen for approval for inogen portable concentrator.  Return to Care: Return in about 4 months (around 04/28/2020).     Lenice Llamas,  MD Pulmonary and [place]

## 2019-12-27 NOTE — Patient Instructions (Signed)
The patient should have follow up scheduled with myself in 4 months.   Follow up with Pulmonary Rehab at [facility].

## 2019-12-28 NOTE — Progress Notes (Deleted)
CARDIOLOGY CONSULT NOTE       Patient ID: Derrick Moon MRN: [id number] DOB/AGE: [birth year] 79 y.o.  Admit date: (Not on file) Referring Physician: Willey Blade Primary Physician: [name], MD Primary Cardiologist: new Reason for Consultation: CHF/HTN  Active Problems:   * No active hospital problems. *   HPI:  79 y.o. referred by Dr Willey Blade for CHF. History of COPD, HTN , PAF, OSA and AAA. He has significant dyspnea and flairs of COPD requiring ER visits and steroids [facility] n.c. Gold stage D with FEV1 only 58% History of diastolic CHF. ? Last echo done 03/12/19 EF 60-65% He takes torsemide as diuretic He was cardioverted in 2018 and is on eliquis for chronic anticoagulation Last US showed AAA distal aorta 3.9 cm PVD with right fem-pop bypass 2017 last ABI's 2018 normal   No history of clinical CAD. Cardiac CT done 04/17/17 with calcium score 64 only 23 rd percentile for age 39-50% RCA/LAD disease  ***  ROS All other systems reviewed and negative except as noted above  Past Medical History:  Diagnosis Date  . AAA (abdominal aortic aneurysm) ([place])   . Anxiety    due to pain in leg  . Arthritis    Osteo  . Bell's palsy   . Cancer [facility])    Bladder cancer  . Cataracts, bilateral   . COPD (chronic obstructive pulmonary disease) ([facility])    Albuterol inhaler as needed  . Diverticulitis   . Enlarged prostate    takes Flomax daily  . GERD (gastroesophageal reflux disease)    was on Prilosec but has been off for yrs-no issues now with reflux  . Headache    migraines years ago  . History of bronchitis    > 59yrs ago  . History of colon polyps    benign  . History of dizziness    several yrs ago-was on Meclizine but has been off for several yrs-no problems since  . History of hiatal hernia   . Hypertension    takes Amlodipine and Lisinpril daily  . Joint pain   . Joint swelling   . Nocturia   . PAD (peripheral artery disease) ([facility])     bilateral  superficial femoral artery occlusions   . Paroxysmal atrial fibrillation ([facility])   . Pneumonia    several times with most recent within 5 yrs ago  . Restless leg syndrome   . Rupture of left quadriceps tendon   . Shortness of breath dyspnea    with exertion  . Sleep apnea    does not use cpap  . Urinary urgency     Family History  Problem Relation Age of Onset  . Parkinson's disease Mother   . AAA (abdominal aortic aneurysm) Father   . Heart defect Sister        Congenital Heart Disease  . Heart disease Sister     Social History   Socioeconomic History  . Marital status: Married    Spouse name: Not on file  . Number of children: Not on file  . Years of education: Not on file  . Highest education level: Not on file  Occupational History  . Not on file  Tobacco Use  . Smoking status: Current Some Day Smoker    Packs/day: 1.00    Years: 45.00    Pack years: 45.00    Types: Cigars    Last attempt to quit: 01/07/2019    Years since quitting: 0.9  .  Smokeless tobacco: Never Used  . Tobacco comment: smokes 1/3 of a cigar a day- 12/27/2019  Vaping Use  . Vaping Use: Never used  Substance and Sexual Activity  . Alcohol use: Yes    Alcohol/week: 0.0 standard drinks    Comment: wine occasionally  . Drug use: Not Currently    Types: Marijuana  . Sexual activity: Yes  Other Topics Concern  . Not on file  Social History Narrative   Pt lives w/ wife in [place], [place]   Social Determinants of Health   Financial Resource Strain:   . Difficulty of Paying Living Expenses: Not on file  Food Insecurity:   . Worried About Charity fundraiser in the Last Year: Not on file  . Ran Out of Food in the Last Year: Not on file  Transportation Needs:   . Lack of Transportation (Medical): Not on file  . Lack of Transportation (Non-Medical): Not on file  Physical Activity:   . Days of Exercise per Week: Not on file  . Minutes of Exercise per Session: Not on file  Stress:   . Feeling  of Stress : Not on file  Social Connections:   . Frequency of Communication with Friends and Family: Not on file  . Frequency of Social Gatherings with Friends and Family: Not on file  . Attends Religious Services: Not on file  . Active Member of Clubs or Organizations: Not on file  . Attends Archivist Meetings: Not on file  . Marital Status: Not on file  Intimate Partner Violence:   . Fear of Current or Ex-Partner: Not on file  . Emotionally Abused: Not on file  . Physically Abused: Not on file  . Sexually Abused: Not on file    Past Surgical History:  Procedure Laterality Date  . BICEPS TENDON REPAIR     LEFT  . CARDIOVERSION N/A 02/06/2017   Procedure: CARDIOVERSION;  Surgeon: Jerline Pain, MD;  Location: [facility] ENDOSCOPY;  Service: Cardiovascular;  Laterality: N/A;  . CARPAL TUNNEL RELEASE Right 2014  . CHOLECYSTECTOMY  July 16,2010  . COLONOSCOPY    . CYSTOSCOPY    . ESOPHAGOGASTRODUODENOSCOPY    . FEMORAL-POPLITEAL BYPASS GRAFT Right 09/19/2015   Procedure: RIGHT COMMON FEMORAL-ABOVE KNEE POPLITEAL ARTERY    BYPASS GRAFT ;  Surgeon: Conrad Carbonville, MD;  Location: [facility];  Service: Vascular;  Laterality: Right;  . FOOT SURGERY Right    x 5t  . HAND SURGERY Right   . HERNIA REPAIR Bilateral    inguinal  . PERIPHERAL VASCULAR CATHETERIZATION N/A 08/02/2015   Procedure: Abdominal Aortogram w/Lower Extremity;  Surgeon: Conrad Colbert, MD;  Location: [facility];  Service: Cardiovascular;  Laterality: N/A;  . QUADRICEPS TENDON REPAIR Left 08/22/2016   Procedure: LEFT OPEN QUADRICEP TENDON REPAIR;  Surgeon: Netta Cedars, MD;  Location: [place];  Service: Orthopedics;  Laterality: Left;  . REVERSE SHOULDER ARTHROPLASTY Right 07/07/2014   Procedure: RIGHT REVERSE TOTAL SHOULDER ;  Surgeon: Netta Cedars, MD;  Location: [place];  Service: Orthopedics;  Laterality: Right;  . REVERSE TOTAL SHOULDER ARTHROPLASTY Right 07/07/2014   Dr Veverly Fells  . ROTATOR CUFF REPAIR     Bilateral     . TEE WITHOUT CARDIOVERSION N/A 02/06/2017   Procedure: TRANSESOPHAGEAL ECHOCARDIOGRAM (TEE);  Surgeon: Jerline Pain, MD;  Location: [facility] ENDOSCOPY;  Service: Cardiovascular;  Laterality: N/A;  . TONSILLECTOMY    . tumor removed from bladder    . VEIN HARVEST Right  09/19/2015   Procedure: WITH NON REVERSE RIGHT GREATER SAPHENOUS VEIN HARVEST;  Surgeon: Conrad Cedar Hills, MD;  Location: [facility];  Service: Vascular;  Laterality: Right;      Current Outpatient Medications:  .  acetaminophen (TYLENOL) 500 MG tablet, Take 1,000 mg by mouth every 6 (six) hours as needed (for pain or headaches). , Disp: , Rfl:  .  albuterol (PROVENTIL HFA;VENTOLIN HFA) 108 (90 BASE) MCG/ACT inhaler, Inhale 1-2 puffs into the lungs every 6 (six) hours as needed for wheezing or shortness of breath. Reported on 10/03/2015, Disp: , Rfl:  .  amLODipine (NORVASC) 5 MG tablet, Take 5 mg by mouth daily., Disp: , Rfl:  .  apixaban (ELIQUIS) 5 MG TABS tablet, Take 1 tablet (5 mg total) 2 (two) times daily by mouth., Disp: 60 tablet, Rfl: 0 .  baclofen (LIORESAL) 10 MG tablet, Take 10 mg by mouth 2 (two) times daily., Disp: , Rfl:  .  budesonide-formoterol (SYMBICORT) 160-4.5 MCG/ACT inhaler, Inhale 2 puffs into the lungs 2 (two) times daily., Disp: 1 Inhaler, Rfl: 0 .  diphenhydrAMINE (BENADRYL) 25 MG tablet, Take 25 mg by mouth every 6 (six) hours as needed for itching or allergies. , Disp: , Rfl:  .  docusate sodium (COLACE) 100 MG capsule, Take 1 capsule (100 mg total) by mouth 2 (two) times daily. (Patient taking differently: Take 100 mg by mouth daily as needed. ), Disp: 10 capsule, Rfl: 0 .  famotidine (PEPCID) 20 MG tablet, TAKE (1) TABLET BY MOUTH AT BEDTIME. (Patient taking differently: Take 20 mg by mouth at bedtime. TAKE (1) TABLET BY MOUTH AT BEDTIME.), Disp: 30 tablet, Rfl: 0 .  guaiFENesin (MUCINEX) 600 MG 12 hr tablet, Take 600 mg by mouth 2 (two) times daily as needed for cough or to loosen phlegm., Disp: , Rfl:  .   hydrOXYzine (ATARAX/VISTARIL) 25 MG tablet, Take 1 tablet (25 mg total) by mouth at bedtime., Disp: 30 tablet, Rfl: 0 .  ipratropium-albuterol (DUONEB) 0.5-2.5 (3) MG/3ML SOLN, Take 3 mLs by nebulization every 4 (four) hours as needed., Disp: 360 mL, Rfl: 0 .  levocetirizine (XYZAL) 5 MG tablet, Take 5 mg by mouth every evening., Disp: , Rfl:  .  losartan (COZAAR) 50 MG tablet, Take 50 mg by mouth at bedtime., Disp: , Rfl:  .  Menthol-Methyl Salicylate (MUSCLE RUB) 10-15 % CREA, Apply 1 application topically 4 (four) times daily., Disp: 85 g, Rfl: 0 .  metoprolol tartrate (LOPRESSOR) 25 MG tablet, Take 0.5 tablets (12.5 mg total) by mouth 2 (two) times daily. (Patient taking differently: Take 12.5 mg by mouth daily. ), Disp: 30 tablet, Rfl: 3 .  montelukast (SINGULAIR) 10 MG tablet, TAKE (1) TABLET BY MOUTH AT BEDTIME. (Patient taking differently: Take 10 mg by mouth at bedtime. ), Disp: 30 tablet, Rfl: 1 .  pantoprazole (PROTONIX) 40 MG tablet, TAKE 1 TABLET BY MOUTH 30 TO 60 MINUTES PRIOR TO THE FIRST MEAL OF THE DAY. (Patient taking differently: Take 40 mg by mouth daily. ), Disp: 30 tablet, Rfl: 0 .  potassium chloride (KLOR-CON) 20 MEQ tablet, Take 1 tablet (20 mEq total) by mouth 2 (two) times daily., Disp: 60 tablet, Rfl: 1 .  predniSONE (DELTASONE) 20 MG tablet, Take 2 tablets (40 mg total) by mouth daily with breakfast for 5 days., Disp: 10 tablet, Rfl: 0 .  Tamsulosin HCl (FLOMAX) 0.4 MG CAPS, Take 0.4 mg by mouth daily after supper. , Disp: , Rfl:  .  torsemide (DEMADEX) 20  MG tablet, Take 2 tablets (40 mg total) by mouth daily., Disp: 60 tablet, Rfl: 2 .  vitamin B-12 (CYANOCOBALAMIN) 500 MCG tablet, Take 500 mcg by mouth in the morning and at bedtime., Disp: , Rfl:     Physical Exam: There were no vitals taken for this visit.    Affect appropriate Chronically ill COPDer HEENT: normal Neck supple with no adenopathy JVP normal no bruits no thyromegaly Lungs exp wheezing  Heart:   S1/S2 no murmur, no rub, gallop or click PMI normal Abdomen: benighn, BS positve, no tenderness, no AAA no bruit.  No HSM or HJR Post right fem-pop with palpable pedal pulses  No edema Neuro non-focal Skin warm and dry No muscular weakness y   Labs:   Lab Results  Component Value Date   WBC 14.1 (H) 06/18/2019   HGB 10.6 (L) 06/18/2019   HCT 39.2 06/18/2019   MCV 73.7 (L) 06/18/2019   PLT 667 (H) 06/18/2019   No results for input(s): NA, K, CL, CO2, BUN, CREATININE, CALCIUM, PROT, BILITOT, ALKPHOS, ALT, AST, GLUCOSE in the last 168 hours.  Invalid input(s): LABALBU Lab Results  Component Value Date   CKTOTAL 470 (H) 06/29/2008   CKMB 3.2 06/29/2008   TROPONINI <0.03 08/15/2016    Lab Results  Component Value Date   CHOL  06/29/2008    153        ATP III CLASSIFICATION:  <200     mg/dL   Desirable  200-239  mg/dL   Borderline High  >=240    mg/dL   High          Lab Results  Component Value Date   HDL 29 (L) 06/29/2008   Lab Results  Component Value Date   LDLCALC  06/29/2008    99        Total Cholesterol/HDL:CHD Risk Coronary Heart Disease Risk Table                     Men   Women  1/2 Average Risk   3.4   3.3  Average Risk       5.0   4.4  2 X Average Risk   9.6   7.1  3 X Average Risk  23.4   11.0        Use the calculated Patient Ratio above and the CHD Risk Table to determine the patient's CHD Risk.        ATP III CLASSIFICATION (LDL):  <100     mg/dL   Optimal  100-129  mg/dL   Near or Above                    Optimal  130-159  mg/dL   Borderline  160-189  mg/dL   High  >190     mg/dL   Very High   Lab Results  Component Value Date   TRIG 163 (H) 03/11/2019   TRIG 123 06/29/2008   Lab Results  Component Value Date   CHOLHDL 5.3 06/29/2008   No results found for: LDLDIRECT    Radiology: No results found.  EKG: SR rate 78 normal 06/15/19    ASSESSMENT AND PLAN:   1. COPD: quality of life limiting Gold D continue pulse steroids  and inhalers f/u with [facility] Dr Shearon Stalls  2. Diastolic Dysfucntion: not clear of diagnosis on diuretic last echo 03/12/19 with normal EF 60-65% and indeterminate diastolic parameters  3. PVD:  ABIS 2018 ok post  right fem- pop needs f/u with [facility]  4.  PAF:  Maintaining NSR on eliquis and lopressor  5. HtN: Well controlled.  Continue current medications and low sodium Dash type diet.    6. ***  F/U with cardiology in a year   Signed: Jenkins Rouge 12/28/2019, 4:59 PM

## 2020-01-09 DIAGNOSIS — J449 Chronic obstructive pulmonary disease, unspecified: Secondary | ICD-10-CM | POA: Diagnosis not present

## 2020-01-09 DIAGNOSIS — Z79899 Other long term (current) drug therapy: Secondary | ICD-10-CM | POA: Diagnosis not present

## 2020-01-09 DIAGNOSIS — I5032 Chronic diastolic (congestive) heart failure: Secondary | ICD-10-CM | POA: Diagnosis not present

## 2020-01-09 DIAGNOSIS — I10 Essential (primary) hypertension: Secondary | ICD-10-CM | POA: Diagnosis not present

## 2020-01-09 DIAGNOSIS — D508 Other iron deficiency anemias: Secondary | ICD-10-CM | POA: Diagnosis not present

## 2020-01-10 ENCOUNTER — Ambulatory Visit: Payer: PPO | Admitting: Cardiovascular Disease

## 2020-01-11 ENCOUNTER — Telehealth: Payer: Self-pay | Admitting: Internal Medicine

## 2020-01-11 NOTE — Telephone Encounter (Signed)
Noted. Thank you. No additional recommendations at this time. Except that he should let us know if prednisone isn't helping and things aren't getting better.

## 2020-01-11 NOTE — Telephone Encounter (Signed)
Spoke with the pt's spouse, [name]  She states that the pt is having increased SOB, wheezing and cough with clear sputum x 2 days  The symptoms are worse today, so he just started taking the pred taper that Dr Shearon Stalls prescribed previous visit  She states that Dr Shearon Stalls had asked them to call and inform her if he needed this  Pt denies any f/c/s, body aches  He has had his covid vaccine  He is taking all of his respiratory meds as directed and has been taking mucinex daily  Will forward to Dr Shearon Stalls to make her aware and see if has any additional recs  Thanks!

## 2020-01-11 NOTE — Telephone Encounter (Signed)
Spoke with pt's wife, [name]. She is aware of Dr. Mauricio Po response. Nothing further was needed.

## 2020-01-27 ENCOUNTER — Encounter (HOSPITAL_COMMUNITY)
Admission: RE | Admit: 2020-01-27 | Discharge: 2020-01-27 | Disposition: A | Discharge status: Home / Self Care | Payer: PPO | Source: Ambulatory Visit | Attending: Internal Medicine | Admitting: Internal Medicine

## 2020-01-27 ENCOUNTER — Encounter (HOSPITAL_COMMUNITY): Payer: Self-pay

## 2020-01-27 ENCOUNTER — Other Ambulatory Visit: Payer: Self-pay

## 2020-01-27 VITALS — BP 108/80 | HR 57 | Ht 71.0 in | Wt 222.2 lb

## 2020-01-27 DIAGNOSIS — J449 Chronic obstructive pulmonary disease, unspecified: Secondary | ICD-10-CM | POA: Insufficient documentation

## 2020-01-27 HISTORY — DX: Heart failure, unspecified: I50.9

## 2020-01-27 HISTORY — DX: Cardiac arrhythmia, unspecified: I49.9

## 2020-01-27 NOTE — Progress Notes (Signed)
Pulmonary Individual Treatment Plan  Patient Details  Name: Derrick Moon MRN: [id number] Date of Birth: 11/27/1940 Referring Provider:     PULMONARY REHAB COPD ORIENTATION from 01/27/2020 in [place]  Referring Provider Dr. Shearon Stalls       Initial Encounter Date:    PULMONARY REHAB COPD ORIENTATION from 01/27/2020 in [place]  Date 01/27/20      Visit Diagnosis: COPD with chronic bronchitis and emphysema ([place])  Patient's Home Medications on Admission:   Current Outpatient Medications:  .  acetaminophen (TYLENOL) 500 MG tablet, Take 1,000 mg by mouth every 6 (six) hours as needed (for pain or headaches). , Disp: , Rfl:  .  albuterol (PROVENTIL HFA;VENTOLIN HFA) 108 (90 BASE) MCG/ACT inhaler, Inhale 1-2 puffs into the lungs every 6 (six) hours as needed for wheezing or shortness of breath. Reported on 10/03/2015, Disp: , Rfl:  .  amLODipine (NORVASC) 5 MG tablet, Take 5 mg by mouth daily., Disp: , Rfl:  .  apixaban (ELIQUIS) 5 MG TABS tablet, Take 1 tablet (5 mg total) 2 (two) times daily by mouth., Disp: 60 tablet, Rfl: 0 .  baclofen (LIORESAL) 10 MG tablet, Take 10 mg by mouth 2 (two) times daily., Disp: , Rfl:  .  budesonide-formoterol (SYMBICORT) 160-4.5 MCG/ACT inhaler, Inhale 2 puffs into the lungs 2 (two) times daily., Disp: 1 Inhaler, Rfl: 0 .  diphenhydrAMINE (BENADRYL) 25 MG tablet, Take 25 mg by mouth every 6 (six) hours as needed for itching or allergies. , Disp: , Rfl:  .  docusate sodium (COLACE) 100 MG capsule, Take 1 capsule (100 mg total) by mouth 2 (two) times daily. (Patient taking differently: Take 100 mg by mouth daily as needed. ), Disp: 10 capsule, Rfl: 0 .  famotidine (PEPCID) 20 MG tablet, TAKE (1) TABLET BY MOUTH AT BEDTIME. (Patient taking differently: Take 20 mg by mouth at bedtime. TAKE (1) TABLET BY MOUTH AT BEDTIME.), Disp: 30 tablet, Rfl: 0 .  guaiFENesin (MUCINEX) 600 MG 12 hr tablet, Take 600 mg by mouth 2  (two) times daily as needed for cough or to loosen phlegm., Disp: , Rfl:  .  hydrOXYzine (ATARAX/VISTARIL) 25 MG tablet, Take 1 tablet (25 mg total) by mouth at bedtime., Disp: 30 tablet, Rfl: 0 .  ipratropium-albuterol (DUONEB) 0.5-2.5 (3) MG/3ML SOLN, Take 3 mLs by nebulization every 4 (four) hours as needed., Disp: 360 mL, Rfl: 0 .  levocetirizine (XYZAL) 5 MG tablet, Take 5 mg by mouth every evening., Disp: , Rfl:  .  metoprolol tartrate (LOPRESSOR) 25 MG tablet, Take 0.5 tablets (12.5 mg total) by mouth 2 (two) times daily., Disp: 30 tablet, Rfl: 3 .  montelukast (SINGULAIR) 10 MG tablet, TAKE (1) TABLET BY MOUTH AT BEDTIME. (Patient taking differently: Take 10 mg by mouth at bedtime. ), Disp: 30 tablet, Rfl: 1 .  pantoprazole (PROTONIX) 40 MG tablet, TAKE 1 TABLET BY MOUTH 30 TO 60 MINUTES PRIOR TO THE FIRST MEAL OF THE DAY. (Patient taking differently: Take 40 mg by mouth daily. ), Disp: 30 tablet, Rfl: 0 .  predniSONE (DELTASONE) 10 MG tablet, Take 10 mg by mouth daily with breakfast., Disp: , Rfl:  .  Tamsulosin HCl (FLOMAX) 0.4 MG CAPS, Take 0.4 mg by mouth daily after supper. , Disp: , Rfl:  .  vitamin B-12 (CYANOCOBALAMIN) 500 MCG tablet, Take 1,000 mcg by mouth in the morning and at bedtime. , Disp: , Rfl:  .  potassium chloride (KLOR-CON) 20 MEQ tablet,  Take 1 tablet (20 mEq total) by mouth 2 (two) times daily., Disp: 60 tablet, Rfl: 1 .  torsemide (DEMADEX) 20 MG tablet, Take 2 tablets (40 mg total) by mouth daily., Disp: 60 tablet, Rfl: 2  Past Medical History: Past Medical History:  Diagnosis Date  . AAA (abdominal aortic aneurysm) ([place])   . Anxiety    due to pain in leg  . Arrhythmia   . Arthritis    Osteo  . Bell's palsy   . Cancer [facility])    Bladder cancer  . Cataracts, bilateral   . CHF (congestive heart failure) ([place])   . COPD (chronic obstructive pulmonary disease) ([facility])    Albuterol inhaler as needed  . Diverticulitis   . Enlarged prostate    takes Flomax daily   . GERD (gastroesophageal reflux disease)    was on Prilosec but has been off for yrs-no issues now with reflux  . Headache    migraines years ago  . History of bronchitis    > 45yrs ago  . History of colon polyps    benign  . History of dizziness    several yrs ago-was on Meclizine but has been off for several yrs-no problems since  . History of hiatal hernia   . Hypertension    takes Amlodipine and Lisinpril daily  . Joint pain   . Joint swelling   . Nocturia   . PAD (peripheral artery disease) ([facility])     bilateral superficial femoral artery occlusions   . Paroxysmal atrial fibrillation ([facility])   . Pneumonia    several times with most recent within 5 yrs ago  . Restless leg syndrome   . Rupture of left quadriceps tendon   . Shortness of breath dyspnea    with exertion  . Sleep apnea    does not use cpap  . Urinary urgency     Tobacco Use: Social History   Tobacco Use  Smoking Status Current Some Day Smoker  . Packs/day: 0.25  . Years: 45.00  . Pack years: 11.25  . Types: Cigars  . Last attempt to quit: 01/07/2019  . Years since quitting: 1.0  Smokeless Tobacco Never Used  Tobacco Comment   smokes 1/3 of a cigar a day- 12/27/2019    Labs: Recent Review Flowsheet Data    Labs for ITP Cardiac and Pulmonary Rehab Latest Ref Rng & Units 06/29/2008 08/02/2015 09/19/2015 03/11/2019   Cholestrol 0 - 200 mg/dL 153 .[name]. - - -   LDLCALC 0 - 99 mg/dL 99 ... - - -   HDL >39 mg/dL 29(L) - - -   Trlycerides <150 mg/dL 123 - - 163(H)   HCO3 20.0 - 24.0 mEq/L - - 26.8(H) -   TCO2 0 - 100 mmol/L - 24 28 -   O2SAT % - - 99.0 -      Capillary Blood Glucose: No results found for: GLUCAP   Pulmonary Assessment Scores:  Pulmonary Assessment Scores    Row Name 01/27/20 1418         ADL UCSD   ADL Phase Entry     SOB Score total 58       CAT Score   CAT Score 25       mMRC Score   mMRC Score 1           UCSD: Self-administered rating of dyspnea associated with  activities of daily living (ADLs) 6-point scale (0 = "not at all" to 5 = "maximal or unable  to do because of breathlessness")  Scoring Scores range from 0 to 120.  Minimally important difference is 5 units  CAT: CAT can identify the health impairment of COPD patients and is better correlated with disease progression.  CAT has a scoring range of zero to 40. The CAT score is classified into four groups of low (less than 10), medium (10 - 20), high (21-30) and very high (31-40) based on the impact level of disease on health status. A CAT score over 10 suggests significant symptoms.  A worsening CAT score could be explained by an exacerbation, poor medication adherence, poor inhaler technique, or progression of COPD or comorbid conditions.  CAT MCID is 2 points  mMRC: mMRC (Modified Medical Research Council) Dyspnea Scale is used to assess the degree of baseline functional disability in patients of respiratory disease due to dyspnea. No minimal important difference is established. A decrease in score of 1 point or greater is considered a positive change.   Pulmonary Function Assessment:  Pulmonary Function Assessment - 01/27/20 1255      Breath   Shortness of Breath Yes;Limiting activity           Exercise Target Goals: Exercise Program Goal: Individual exercise prescription set using results from initial 6 min walk test and THRR while considering  patient's activity barriers and safety.   Exercise Prescription Goal: Initial exercise prescription builds to 30-45 minutes a day of aerobic activity, 2-3 days per week.  Home exercise guidelines will be given to patient during program as part of exercise prescription that the participant will acknowledge.  Activity Barriers & Risk Stratification:  Activity Barriers & Cardiac Risk Stratification - 01/27/20 1251      Activity Barriers & Cardiac Risk Stratification   Activity Barriers Arthritis;Back Problems;Joint  Problems;Deconditioning;Muscular Weakness;Shortness of Breath;Assistive Device    Cardiac Risk Stratification High           6 Minute Walk:  6 Minute Walk    Row Name 01/27/20 1449         6 Minute Walk   Phase Initial     Distance 600 feet     Walk Time 6 minutes     # of Rest Breaks 1     MPH 1.14     METS 0.9     RPE 13     Perceived Dyspnea  15     VO2 Peak 3.16     Symptoms Yes (comment)     Comments 1 seated rest break for 2 min due to cramps in his quads. He rated the cramps a 7/10 for pain     Resting HR 57 bpm     Resting BP 108/80     Resting Oxygen Saturation  96 %     Exercise Oxygen Saturation  during 6 min walk 94 %     Max Ex. HR 89 bpm     Max Ex. BP 128/76     2 Minute Post BP 110/78       Interval HR   1 Minute HR 83     2 Minute HR 86     3 Minute HR 89     4 Minute HR 74     5 Minute HR 74     6 Minute HR 83     2 Minute Post HR 66     Interval Heart Rate? Yes       Interval Oxygen   Interval Oxygen? Yes     Baseline  Oxygen Saturation % 96 %     1 Minute Oxygen Saturation % 94 %     1 Minute Liters of Oxygen 2 L     2 Minute Oxygen Saturation % 94 %     2 Minute Liters of Oxygen 2 L     3 Minute Oxygen Saturation % 95 %     3 Minute Liters of Oxygen 2 L     4 Minute Oxygen Saturation % 97 %     4 Minute Liters of Oxygen 2 L     5 Minute Oxygen Saturation % 98 %     5 Minute Liters of Oxygen 2 L     6 Minute Oxygen Saturation % 96 %     6 Minute Liters of Oxygen 2 L     2 Minute Post Oxygen Saturation % 97 %     2 Minute Post Liters of Oxygen 2 L            Oxygen Initial Assessment:  Oxygen Initial Assessment - 01/27/20 1418      Initial 6 min Walk   Oxygen Used Continuous    Liters per minute 2      Program Oxygen Prescription   Program Oxygen Prescription Continuous    Liters per minute 2      Intervention   Short Term Goals To learn and exhibit compliance with exercise, home and travel O2 prescription;To learn and  understand importance of monitoring SPO2 with pulse oximeter and demonstrate accurate use of the pulse oximeter.;To learn and demonstrate proper pursed lip breathing techniques or other breathing techniques.;To learn and demonstrate proper use of respiratory medications;To learn and understand importance of maintaining oxygen saturations>88%    Long  Term Goals Exhibits compliance with exercise, home and travel O2 prescription;Verbalizes importance of monitoring SPO2 with pulse oximeter and return demonstration;Maintenance of O2 saturations>88%;Exhibits proper breathing techniques, such as pursed lip breathing or other method taught during program session;Compliance with respiratory medication;Demonstrates proper use of MDI's           Oxygen Re-Evaluation:   Oxygen Discharge (Final Oxygen Re-Evaluation):   Initial Exercise Prescription:  Initial Exercise Prescription - 01/27/20 1400      Date of Initial Exercise RX and Referring Provider   Date 01/27/20    Referring Provider Dr. Shearon Stalls     Expected Discharge Date 05/31/20      Oxygen   Oxygen Continuous    Liters 2      NuStep   Level 1    SPM 60    Minutes 17      Arm Ergometer   Level 1    RPM 60    Minutes 22      Prescription Details   Frequency (times per week) 2    Duration Progress to 30 minutes of continuous aerobic without signs/symptoms of physical distress      Intensity   THRR 40-80% of Max Heartrate 56-112    Ratings of Perceived Exertion 11-13    Perceived Dyspnea 0-4      Resistance Training   Training Prescription Yes    Weight 2 lbs    Reps 10-15           Perform Capillary Blood Glucose checks as needed.  Exercise Prescription Changes:   Exercise Comments:   Exercise Goals and Review:  Exercise Goals    Row Name 01/27/20 1452             Exercise Goals  Increase Physical Activity Yes       Intervention Provide advice, education, support and counseling about physical  activity/exercise needs.;Develop an individualized exercise prescription for aerobic and resistive training based on initial evaluation findings, risk stratification, comorbidities and participant's personal goals.       Expected Outcomes Short Term: Attend rehab on a regular basis to increase amount of physical activity.;Long Term: Add in home exercise to make exercise part of routine and to increase amount of physical activity.;Long Term: Exercising regularly at least 3-5 days a week.       Increase Strength and Stamina Yes       Intervention Provide advice, education, support and counseling about physical activity/exercise needs.;Develop an individualized exercise prescription for aerobic and resistive training based on initial evaluation findings, risk stratification, comorbidities and participant's personal goals.       Expected Outcomes Short Term: Increase workloads from initial exercise prescription for resistance, speed, and METs.;Short Term: Perform resistance training exercises routinely during rehab and add in resistance training at home;Long Term: Improve cardiorespiratory fitness, muscular endurance and strength as measured by increased METs and functional capacity (6MWT)       Able to understand and use rate of perceived exertion (RPE) scale Yes       Intervention Provide education and explanation on how to use RPE scale       Expected Outcomes Short Term: Able to use RPE daily in rehab to express subjective intensity level;Long Term:  Able to use RPE to guide intensity level when exercising independently       Able to understand and use Dyspnea scale Yes       Intervention Provide education and explanation on how to use Dyspnea scale       Expected Outcomes Short Term: Able to use Dyspnea scale daily in rehab to express subjective sense of shortness of breath during exertion;Long Term: Able to use Dyspnea scale to guide intensity level when exercising independently       Knowledge and  understanding of Target Heart Rate Range (THRR) Yes       Intervention Provide education and explanation of THRR including how the numbers were predicted and where they are located for reference       Expected Outcomes Short Term: Able to state/look up THRR;Long Term: Able to use THRR to govern intensity when exercising independently;Short Term: Able to use daily as guideline for intensity in rehab       Understanding of Exercise Prescription Yes       Intervention Provide education, explanation, and written materials on patient's individual exercise prescription       Expected Outcomes Short Term: Able to explain program exercise prescription;Long Term: Able to explain home exercise prescription to exercise independently              Exercise Goals Re-Evaluation :   Discharge Exercise Prescription (Final Exercise Prescription Changes):   Nutrition:  Target Goals: Understanding of nutrition guidelines, daily intake of sodium 1500mg , cholesterol 200mg , calories 30% from fat and 7% or less from saturated fats, daily to have 5 or more servings of fruits and vegetables.  Biometrics:  Pre Biometrics - 01/27/20 1452      Pre Biometrics   Height 5\' 11"  (1.803 m)    Weight 222 lb 3.6 oz (100.8 kg)    Waist Circumference 49 inches    Hip Circumference 45 inches    Waist to Hip Ratio 1.09 %    BMI (Calculated) 31.01  Triceps Skinfold 17 mm    % Body Fat 33.6 %    Grip Strength 30.6 kg    Flexibility 0 in    Single Leg Stand 0 seconds            Nutrition Therapy Plan and Nutrition Goals:   Nutrition Assessments:  Nutrition Assessments - 01/27/20 1418      MEDFICTS Scores   Pre Score 21           Nutrition Goals Re-Evaluation:   Nutrition Goals Discharge (Final Nutrition Goals Re-Evaluation):   Psychosocial: Target Goals: Acknowledge presence or absence of significant depression and/or stress, maximize coping skills, provide positive support system. Participant  is able to verbalize types and ability to use techniques and skills needed for reducing stress and depression.  Initial Review & Psychosocial Screening:  Initial Psych Review & Screening - 01/27/20 1256      Initial Review   Current issues with None Identified      Family Dynamics   Good Support System? Yes    Comments His wife is his support system. His dog just passed away who was also part of his support system. His daughter also supports him when she can. She is busy with her two children.      Barriers   Psychosocial barriers to participate in program There are no identifiable barriers or psychosocial needs.      Screening Interventions   Interventions Encouraged to exercise;Other (comment)           Quality of Life Scores:  Quality of Life - 01/27/20 1449      Quality of Life   Select Quality of Life      Quality of Life Scores   Health/Function Pre 20.43 %    Socioeconomic Pre 25.75 %    Psych/Spiritual Pre 26.36 %    Family Pre 27.6 %    GLOBAL Pre 23.74 %          Scores of 19 and below usually indicate a poorer quality of life in these areas.  A difference of  2-3 points is a clinically meaningful difference.  A difference of 2-3 points in the total score of the Quality of Life Index has been associated with significant improvement in overall quality of life, self-image, physical symptoms, and general health in studies assessing change in quality of life.   PHQ-9: Recent Review Flowsheet Data    Depression screen [facility] 2/9 01/27/2020 05/07/2017   Decreased Interest 0 0   Down, Depressed, Hopeless 0 0   PHQ - 2 Score 0 0   Altered sleeping 0 -   Tired, decreased energy 1 -   Change in appetite 0 -   Feeling bad or failure about yourself  0 -   Trouble concentrating 0 -   Moving slowly or fidgety/restless 0 -   Suicidal thoughts 0 -   PHQ-9 Score 1 -   Difficult doing work/chores Somewhat difficult -     Interpretation of Total Score  Total Score  Depression Severity:  1-4 = Minimal depression, 5-9 = Mild depression, 10-14 = Moderate depression, 15-19 = Moderately severe depression, 20-27 = Severe depression   Psychosocial Evaluation and Intervention:   Psychosocial Re-Evaluation:   Psychosocial Discharge (Final Psychosocial Re-Evaluation):    Education: Education Goals: Education classes will be provided on a weekly basis, covering required topics. Participant will state understanding/return demonstration of topics presented.  Learning Barriers/Preferences:   Education Topics: How Lungs Work and Diseases: -  Discuss the anatomy of the lungs and diseases that can affect the lungs, such as COPD.   Exercise: -Discuss the importance of exercise, FITT principles of exercise, normal and abnormal responses to exercise, and how to exercise safely.   Environmental Irritants: -Discuss types of environmental irritants and how to limit exposure to environmental irritants.   Meds/Inhalers and oxygen: - Discuss respiratory medications, definition of an inhaler and oxygen, and the proper way to use an inhaler and oxygen.   Energy Saving Techniques: - Discuss methods to conserve energy and decrease shortness of breath when performing activities of daily living.    Bronchial Hygiene / Breathing Techniques: - Discuss breathing mechanics, pursed-lip breathing technique,  proper posture, effective ways to clear airways, and other functional breathing techniques   Cleaning Equipment: - Provides group verbal and written instruction about the health risks of elevated stress, cause of high stress, and healthy ways to reduce stress.   Nutrition I: Fats: - Discuss the types of cholesterol, what cholesterol does to the body, and how cholesterol levels can be controlled.   Nutrition II: Labels: -Discuss the different components of food labels and how to read food labels.   Respiratory Infections: - Discuss the signs and symptoms  of respiratory infections, ways to prevent respiratory infections, and the importance of seeking medical treatment when having a respiratory infection.   Stress I: Signs and Symptoms: - Discuss the causes of stress, how stress may lead to anxiety and depression, and ways to limit stress.   Stress II: Relaxation: -Discuss relaxation techniques to limit stress.   Oxygen for Home/Travel: - Discuss how to prepare for travel when on oxygen and proper ways to transport and store oxygen to ensure safety.   Knowledge Questionnaire Score:  Knowledge Questionnaire Score - 01/27/20 1419      Knowledge Questionnaire Score   Pre Score 16/18           Core Components/Risk Factors/Patient Goals at Admission:  Personal Goals and Risk Factors at Admission - 01/27/20 1419      Core Components/Risk Factors/Patient Goals on Admission   Improve shortness of breath with ADL's Yes    Intervention Provide education, individualized exercise plan and daily activity instruction to help decrease symptoms of SOB with activities of daily living.    Expected Outcomes Short Term: Improve cardiorespiratory fitness to achieve a reduction of symptoms when performing ADLs;Long Term: Be able to perform more ADLs without symptoms or delay the onset of symptoms    Heart Failure Yes    Intervention Provide a combined exercise and nutrition program that is supplemented with education, support and counseling about heart failure. Directed toward relieving symptoms such as shortness of breath, decreased exercise tolerance, and extremity edema.    Expected Outcomes Improve functional capacity of life;Short term: Attendance in program 2-3 days a week with increased exercise capacity. Reported lower sodium intake. Reported increased fruit and vegetable intake. Reports medication compliance.;Short term: Daily weights obtained and reported for increase. Utilizing diuretic protocols set by physician.;Long term: Adoption of  self-care skills and reduction of barriers for early signs and symptoms recognition and intervention leading to self-care maintenance.           Core Components/Risk Factors/Patient Goals Review:    Core Components/Risk Factors/Patient Goals at Discharge (Final Review):    ITP Comments:   Comments: Patient arrived for orientation at 1230. Patient was referred to PR by Dr. Shearon Stalls due to COPD II with chronic bronchitis and emphysema. During orientation advised patient  on arrival and appointment times what to wear, what to do before, during and after exercise. Reviewed attendance and class policy.  Pt is scheduled to return Pulmonary Rehab on 01/31/2020 at 1330. Pt was advised to come to class 15 minutes before class starts.  Discussed RPE/Dpysnea scales. Patient was able to complete 6 minute walk test. Patient was measured for the equipment. Discussed equipment safety with patient. Took patient pre-anthropometric measurements. Patient finished visit at 1400.

## 2020-01-27 NOTE — Progress Notes (Signed)
Cardiac/Pulmonary Rehab Medication Review by a Pharmacist  Does the patient  feel that his/her medications are working for him/her?  yes  Has the patient been experiencing any side effects to the medications prescribed?  yes  Does the patient measure his/her own blood pressure or blood glucose at home?  yes   Does the patient have any problems obtaining medications due to transportation or finances?   no  Understanding of regimen: good Understanding of indications: fair Potential of compliance: fair  Questions asked to Determine Patient Understanding of Medication Regimen:  1. What is the name of the medication?  2. What is the medication used for?  3. When should it be taken?  4. How much should be taken?  5. How will you take it?  6. What side effects should you report?  Understanding Defined as: Excellent: All questions above are correct Good: Questions 1-4 are correct Fair: Questions 1-2 are correct  Poor: 1 or none of the above questions are correct   Pharmacist comments: Patient reports that sometimes his BP gets too low when he takes all of his BP meds. Also, he doesn't take his torsemide as prescribed (40mg  daily) because it makes him urinate too much.    Despina Pole 01/27/2020 1:34 PM

## 2020-01-31 ENCOUNTER — Encounter (HOSPITAL_COMMUNITY)
Admission: RE | Admit: 2020-01-31 | Discharge: 2020-01-31 | Disposition: A | Discharge status: Home / Self Care | Payer: PPO | Source: Ambulatory Visit | Attending: Internal Medicine | Admitting: Internal Medicine

## 2020-01-31 ENCOUNTER — Other Ambulatory Visit: Payer: Self-pay

## 2020-01-31 VITALS — Wt 221.1 lb

## 2020-01-31 DIAGNOSIS — J449 Chronic obstructive pulmonary disease, unspecified: Secondary | ICD-10-CM | POA: Diagnosis not present

## 2020-01-31 NOTE — Progress Notes (Signed)
Daily Session Note  Patient Details  Name: Derrick Moon MRN: [id number] Date of Birth: 06-Sep-1940 Referring Provider:     PULMONARY REHAB COPD ORIENTATION from 01/27/2020 in [place]  Referring Provider Dr. Shearon Stalls       Encounter Date: 01/31/2020  Check In:  Session Check In - 01/31/20 1330      Check-In   Supervising physician immediately available to respond to emergencies [facility] MD immediately available    Physician(s) Domenic Polite    Staff Present Geanie Cooley, RN;Madison Audria Nine, MS, Exercise Physiologist;Dalton Kris Mouton, MS, ACSM-CEP, Exercise Physiologist    Virtual Visit No    Medication changes reported     No    Fall or balance concerns reported    No    Tobacco Cessation No Change    Warm-up and Cool-down Performed as group-led instruction    Resistance Training Performed Yes    VAD Patient? No    PAD/SET Patient? No      Pain Assessment   Currently in Pain? Yes    Pain Score 8     Pain Location Shoulder    Pain Orientation Left    Pain Descriptors / Indicators Constant    Pain Type Chronic pain    Pain Onset More than a month ago    Pain Frequency Intermittent    Multiple Pain Sites No      2nd Pain Site   Pain Descriptors / Indicators --    Pain Onset --           Capillary Blood Glucose: No results found for this or any previous visit (from the past 24 hour(s)).    Social History   Tobacco Use  Smoking Status Current Some Day Smoker  . Packs/day: 0.25  . Years: 45.00  . Pack years: 11.25  . Types: Cigars  . Last attempt to quit: 01/07/2019  . Years since quitting: 1.0  Smokeless Tobacco Never Used  Tobacco Comment   smokes 1/3 of a cigar a day- 12/27/2019    Goals Met:  Proper associated with RPD/PD & O2 Sat Using PLB without cueing & demonstrates good technique Exercise tolerated well No report of cardiac concerns or symptoms Strength training completed today  Goals Unmet:  Not Applicable  Comments:  check out @ 14:30   Dr. Kathie Dike is Medical Director for [facility] Pulmonary Re[facility].

## 2020-02-02 ENCOUNTER — Encounter (HOSPITAL_COMMUNITY)
Admission: RE | Admit: 2020-02-02 | Discharge: 2020-02-02 | Disposition: A | Discharge status: Home / Self Care | Payer: PPO | Source: Ambulatory Visit | Attending: Internal Medicine | Admitting: Internal Medicine

## 2020-02-02 ENCOUNTER — Other Ambulatory Visit: Payer: Self-pay

## 2020-02-02 DIAGNOSIS — J449 Chronic obstructive pulmonary disease, unspecified: Secondary | ICD-10-CM

## 2020-02-02 NOTE — Progress Notes (Signed)
Daily Session Note  Patient Details  Name: Derrick Moon MRN: [id number] Date of Birth: 1940-08-22 Referring Provider:     PULMONARY REHAB COPD ORIENTATION from 01/27/2020 in [place]  Referring Provider Dr. Shearon Stalls       Encounter Date: 02/02/2020  Check In:  Session Check In - 02/02/20 1330      Check-In   Supervising physician immediately available to respond to emergencies See telemetry face sheet for immediately available ER MD    Physician(s) Domenic Polite    Location [facility]    Staff Present Cathren Harsh, MS, Exercise Physiologist;Johnwesley Lederman Wynetta Emery, RN, BSN;Phyllis Billingsley, RN    Virtual Visit No    Fall or balance concerns reported    No    Tobacco Cessation No Change    Warm-up and Cool-down Performed as group-led instruction    Resistance Training Performed Yes    VAD Patient? No    PAD/SET Patient? No      Pain Assessment   Currently in Pain? No/denies    Multiple Pain Sites No           Capillary Blood Glucose: No results found for this or any previous visit (from the past 24 hour(s)).    Social History   Tobacco Use  Smoking Status Current Some Day Smoker  . Packs/day: 0.25  . Years: 45.00  . Pack years: 11.25  . Types: Cigars  . Last attempt to quit: 01/07/2019  . Years since quitting: 1.0  Smokeless Tobacco Never Used  Tobacco Comment   smokes 1/3 of a cigar a day- 12/27/2019    Goals Met:  Proper associated with RPD/PD & O2 Sat Independence with exercise equipment Improved SOB with ADL's Using PLB without cueing & demonstrates good technique Exercise tolerated well No report of cardiac concerns or symptoms Strength training completed today  Goals Unmet:  Not Applicable  Comments: Check out 1430.   Dr. Kathie Dike is Medical Director for [facility].

## 2020-02-06 ENCOUNTER — Telehealth: Payer: Self-pay | Admitting: Internal Medicine

## 2020-02-06 NOTE — Telephone Encounter (Signed)
Can we ask if they can walk him at pulmonary rehab? I am looking at their notes and it doesn't seem worthwhile to make him come back here for a walk - goes from 96% to 94% on room air.

## 2020-02-06 NOTE — Telephone Encounter (Signed)
ND--pt was seen on 10/5 and a walk was done in the office that day.  Pt could only do 1 lap and did not qualify for the oxygen.  Please advise if we need to have this patient come back in for a visit or can we send in the last OV note to [facility].  thanks

## 2020-02-07 ENCOUNTER — Other Ambulatory Visit: Payer: Self-pay

## 2020-02-07 ENCOUNTER — Encounter (HOSPITAL_COMMUNITY)
Admission: RE | Admit: 2020-02-07 | Discharge: 2020-02-07 | Disposition: A | Discharge status: Home / Self Care | Payer: PPO | Source: Ambulatory Visit | Attending: Internal Medicine | Admitting: Internal Medicine

## 2020-02-07 VITALS — Wt 224.0 lb

## 2020-02-07 DIAGNOSIS — J449 Chronic obstructive pulmonary disease, unspecified: Secondary | ICD-10-CM

## 2020-02-07 NOTE — Telephone Encounter (Signed)
I called back and spoke with Derrick Moon at [facility].  He stated that the pt did not bring his rescue inhaler today and it was not a good day for the pt due to wheezing.  Derrick Moon stated that they will try again on Thursday but he will only be able to do the test with the pt on the stepper,and this is where the pt is sitting instead of standing.  They would not be able to do an actual walk on the pt due to staffing.  They will try and get a walk done next week if the staffing situation allows.  Will forward back to ND to make her aware.

## 2020-02-07 NOTE — Progress Notes (Signed)
Daily Session Note  Patient Details  Name: Derrick Moon MRN: [id number] Date of Birth: 1940/08/29 Referring Provider:     PULMONARY REHAB COPD ORIENTATION from 01/27/2020 in [place]  Referring Provider Dr. Shearon Stalls       Encounter Date: 02/07/2020  Check In:  Session Check In - 02/07/20 1343      Check-In   Supervising physician immediately available to respond to emergencies [facility] immediately available    Physician(s) Dr. Harrington Challenger    Location [facility]    Staff Present Hoy Register, MS, ACSM-CEP, Exercise Physiologist;Madison Audria Nine, MS, Exercise Physiologist;Phyllis Billingsley, RN    Virtual Visit No    Medication changes reported     No    Fall or balance concerns reported    No    Tobacco Cessation No Change    Warm-up and Cool-down Performed as group-led instruction    Resistance Training Performed Yes    VAD Patient? No    PAD/SET Patient? No      Pain Assessment   Currently in Pain? Yes    Pain Score 8     Pain Location Shoulder    Pain Orientation Left    Pain Descriptors / Indicators Constant    Pain Type Chronic pain    Pain Onset More than a month ago    Pain Frequency Intermittent    Multiple Pain Sites No           Capillary Blood Glucose: No results found for this or any previous visit (from the past 24 hour(s)).    Social History   Tobacco Use  Smoking Status Current Some Day Smoker  . Packs/day: 0.25  . Years: 45.00  . Pack years: 11.25  . Types: Cigars  . Last attempt to quit: 01/07/2019  . Years since quitting: 1.0  Smokeless Tobacco Never Used  Tobacco Comment   smokes 1/3 of a cigar a day- 12/27/2019    Goals Met:  Independence with exercise equipment Exercise tolerated well No report of cardiac concerns or symptoms Strength training completed today  Goals Unmet:  Not Applicable  Comments: checkout time is 1430   Dr. Kathie Dike is Medical Director for [facility]  Pulmonary Rehab.

## 2020-02-07 NOTE — Telephone Encounter (Signed)
Called [facility] and spoke to [employer]. He states they could evaluate pt this week on an exercise machine to see if he desats. If pt does not desat then Kirk Ruths said they could likely re-walk pt next week to see if he would desat.   LMTCB for pt to let him know this is a request from our office and pulmonary rehab is helping so pt would not have to come into our office for a walk test.

## 2020-02-07 NOTE — Telephone Encounter (Signed)
Called and spoke with pts wife and she is aware that [facility] will do his test for the oxygen and send that over to ND.

## 2020-02-07 NOTE — Telephone Encounter (Signed)
Patient's wife returning call. [phone].

## 2020-02-09 ENCOUNTER — Encounter (HOSPITAL_COMMUNITY)
Admission: RE | Admit: 2020-02-09 | Discharge: 2020-02-09 | Disposition: A | Discharge status: Home / Self Care | Payer: PPO | Source: Ambulatory Visit | Attending: Internal Medicine | Admitting: Internal Medicine

## 2020-02-09 ENCOUNTER — Other Ambulatory Visit: Payer: Self-pay

## 2020-02-09 ENCOUNTER — Telehealth: Payer: Self-pay | Admitting: Internal Medicine

## 2020-02-09 DIAGNOSIS — J449 Chronic obstructive pulmonary disease, unspecified: Secondary | ICD-10-CM

## 2020-02-09 NOTE — Progress Notes (Signed)
Daily Session Note  Patient Details  Name: Derrick Moon MRN: [id number] Date of Birth: 12/15/1940 Referring Provider:     PULMONARY REHAB COPD ORIENTATION from 01/27/2020 in [place]  Referring Provider Dr. Shearon Stalls       Encounter Date: 02/09/2020  Check In:  Session Check In - 02/09/20 1329      Check-In   Supervising physician immediately available to respond to emergencies [facility] immediately available    Physician(s) Dr. Harl Bowie    Location [facility]    Staff Present Hoy Register, MS, ACSM-CEP, Exercise Physiologist;Leilyn Frayre Wynetta Emery, RN, BSN;Phyllis Billingsley, RN    Virtual Visit No    Medication changes reported     No    Fall or balance concerns reported    No    Tobacco Cessation No Change    Warm-up and Cool-down Performed as group-led instruction    Resistance Training Performed Yes    VAD Patient? No    PAD/SET Patient? No      Pain Assessment   Currently in Pain? No/denies    Pain Score 0-No pain    Multiple Pain Sites No           Capillary Blood Glucose: No results found for this or any previous visit (from the past 24 hour(s)).    Social History   Tobacco Use  Smoking Status Current Some Day Smoker  . Packs/day: 0.25  . Years: 45.00  . Pack years: 11.25  . Types: Cigars  . Last attempt to quit: 01/07/2019  . Years since quitting: 1.0  Smokeless Tobacco Never Used  Tobacco Comment   smokes 1/3 of a cigar a day- 12/27/2019    Goals Met:  Proper associated with RPD/PD & O2 Sat Independence with exercise equipment Improved SOB with ADL's Using PLB without cueing & demonstrates good technique Exercise tolerated well No report of cardiac concerns or symptoms Strength training completed today  Goals Unmet:  Not Applicable  Comments: Check out 1430.   Dr. Kathie Dike is Medical Director for [facility] Pul[facility] Re[facility].

## 2020-02-09 NOTE — Telephone Encounter (Signed)
LMTCB for Derrick Moon at pulmonary rehab

## 2020-02-09 NOTE — Progress Notes (Signed)
Pulmonary Individual Treatment Plan  Patient Details  Name: Derrick Moon MRN: [id number] Date of Birth: [birth year] Referring Provider:     PULMONARY REHAB COPD ORIENTATION from 01/27/2020 in [place]  Referring Provider Dr. Shearon Stalls       Initial Encounter Date:    PULMONARY REHAB COPD ORIENTATION from 01/27/2020 in [place]  Date 01/27/20      Visit Diagnosis: COPD with chronic bronchitis and emphysema ([place])  Patient's Home Medications on Admission:   Current Outpatient Medications:  .  acetaminophen (TYLENOL) 500 MG tablet, Take 1,000 mg by mouth every 6 (six) hours as needed (for pain or headaches). , Disp: , Rfl:  .  albuterol (PROVENTIL HFA;VENTOLIN HFA) 108 (90 BASE) MCG/ACT inhaler, Inhale 1-2 puffs into the lungs every 6 (six) hours as needed for wheezing or shortness of breath. Reported on 10/03/2015, Disp: , Rfl:  .  amLODipine (NORVASC) 5 MG tablet, Take 5 mg by mouth daily., Disp: , Rfl:  .  apixaban (ELIQUIS) 5 MG TABS tablet, Take 1 tablet (5 mg total) 2 (two) times daily by mouth., Disp: 60 tablet, Rfl: 0 .  baclofen (LIORESAL) 10 MG tablet, Take 10 mg by mouth 2 (two) times daily., Disp: , Rfl:  .  budesonide-formoterol (SYMBICORT) 160-4.5 MCG/ACT inhaler, Inhale 2 puffs into the lungs 2 (two) times daily., Disp: 1 Inhaler, Rfl: 0 .  diphenhydrAMINE (BENADRYL) 25 MG tablet, Take 25 mg by mouth every 6 (six) hours as needed for itching or allergies. , Disp: , Rfl:  .  docusate sodium (COLACE) 100 MG capsule, Take 1 capsule (100 mg total) by mouth 2 (two) times daily. (Patient taking differently: Take 100 mg by mouth daily as needed. ), Disp: 10 capsule, Rfl: 0 .  famotidine (PEPCID) 20 MG tablet, TAKE (1) TABLET BY MOUTH AT BEDTIME. (Patient taking differently: Take 20 mg by mouth at bedtime. TAKE (1) TABLET BY MOUTH AT BEDTIME.), Disp: 30 tablet, Rfl: 0 .  guaiFENesin (MUCINEX) 600 MG 12 hr tablet, Take 600 mg by mouth 2  (two) times daily as needed for cough or to loosen phlegm., Disp: , Rfl:  .  hydrOXYzine (ATARAX/VISTARIL) 25 MG tablet, Take 1 tablet (25 mg total) by mouth at bedtime., Disp: 30 tablet, Rfl: 0 .  ipratropium-albuterol (DUONEB) 0.5-2.5 (3) MG/3ML SOLN, Take 3 mLs by nebulization every 4 (four) hours as needed., Disp: 360 mL, Rfl: 0 .  levocetirizine (XYZAL) 5 MG tablet, Take 5 mg by mouth every evening., Disp: , Rfl:  .  metoprolol tartrate (LOPRESSOR) 25 MG tablet, Take 0.5 tablets (12.5 mg total) by mouth 2 (two) times daily., Disp: 30 tablet, Rfl: 3 .  montelukast (SINGULAIR) 10 MG tablet, TAKE (1) TABLET BY MOUTH AT BEDTIME. (Patient taking differently: Take 10 mg by mouth at bedtime. ), Disp: 30 tablet, Rfl: 1 .  pantoprazole (PROTONIX) 40 MG tablet, TAKE 1 TABLET BY MOUTH 30 TO 60 MINUTES PRIOR TO THE FIRST MEAL OF THE DAY. (Patient taking differently: Take 40 mg by mouth daily. ), Disp: 30 tablet, Rfl: 0 .  potassium chloride (KLOR-CON) 20 MEQ tablet, Take 1 tablet (20 mEq total) by mouth 2 (two) times daily., Disp: 60 tablet, Rfl: 1 .  predniSONE (DELTASONE) 10 MG tablet, Take 10 mg by mouth daily with breakfast., Disp: , Rfl:  .  Tamsulosin HCl (FLOMAX) 0.4 MG CAPS, Take 0.4 mg by mouth daily after supper. , Disp: , Rfl:  .  torsemide (DEMADEX) 20 MG tablet,  Take 2 tablets (40 mg total) by mouth daily., Disp: 60 tablet, Rfl: 2 .  vitamin B-12 (CYANOCOBALAMIN) 500 MCG tablet, Take 1,000 mcg by mouth in the morning and at bedtime. , Disp: , Rfl:   Past Medical History: Past Medical History:  Diagnosis Date  . AAA (abdominal aortic aneurysm) ([place])   . Anxiety    due to pain in leg  . Arrhythmia   . Arthritis    Osteo  . Bell's palsy   . Cancer [facility])    Bladder cancer  . Cataracts, bilateral   . CHF (congestive heart failure) ([place])   . COPD (chronic obstructive pulmonary disease) ([facility])    Albuterol inhaler as needed  . Diverticulitis   . Enlarged prostate    takes Flomax daily   . GERD (gastroesophageal reflux disease)    was on Prilosec but has been off for yrs-no issues now with reflux  . Headache    migraines years ago  . History of bronchitis    > 90yrs ago  . History of colon polyps    benign  . History of dizziness    several yrs ago-was on Meclizine but has been off for several yrs-no problems since  . History of hiatal hernia   . Hypertension    takes Amlodipine and Lisinpril daily  . Joint pain   . Joint swelling   . Nocturia   . PAD (peripheral artery disease) ([facility])     bilateral superficial femoral artery occlusions   . Paroxysmal atrial fibrillation ([facility])   . Pneumonia    several times with most recent within 5 yrs ago  . Restless leg syndrome   . Rupture of left quadriceps tendon   . Shortness of breath dyspnea    with exertion  . Sleep apnea    does not use cpap  . Urinary urgency     Tobacco Use: Social History   Tobacco Use  Smoking Status Current Some Day Smoker  . Packs/day: 0.25  . Years: 45.00  . Pack years: 11.25  . Types: Cigars  . Last attempt to quit: 01/07/2019  . Years since quitting: 1.0  Smokeless Tobacco Never Used  Tobacco Comment   smokes 1/3 of a cigar a day- 12/27/2019    Labs: Recent Review Flowsheet Data    Labs for ITP Cardiac and Pulmonary Rehab Latest Ref Rng & Units 06/29/2008 08/02/2015 09/19/2015 03/11/2019   Cholestrol 0 - 200 mg/dL 153 .[name]. - - -   LDLCALC 0 - 99 mg/dL 99 ... - - -   HDL >39 mg/dL 29(L) - - -   Trlycerides <150 mg/dL 123 - - 163(H)   HCO3 20.0 - 24.0 mEq/L - - 26.8(H) -   TCO2 0 - 100 mmol/L - 24 28 -   O2SAT % - - 99.0 -      Capillary Blood Glucose: No results found for: GLUCAP   Pulmonary Assessment Scores:  Pulmonary Assessment Scores    Row Name 01/27/20 1418         ADL UCSD   ADL Phase Entry     SOB Score total 58       CAT Score   CAT Score 25       mMRC Score   mMRC Score 1           UCSD: Self-administered rating of dyspnea associated with  activities of daily living (ADLs) 6-point scale (0 = "not at all" to 5 = "maximal or unable  to do because of breathlessness")  Scoring Scores range from 0 to 120.  Minimally important difference is 5 units  CAT: CAT can identify the health impairment of COPD patients and is better correlated with disease progression.  CAT has a scoring range of zero to 40. The CAT score is classified into four groups of low (less than 10), medium (10 - 20), high (21-30) and very high (31-40) based on the impact level of disease on health status. A CAT score over 10 suggests significant symptoms.  A worsening CAT score could be explained by an exacerbation, poor medication adherence, poor inhaler technique, or progression of COPD or comorbid conditions.  CAT MCID is 2 points  mMRC: mMRC (Modified Medical Research Council) Dyspnea Scale is used to assess the degree of baseline functional disability in patients of respiratory disease due to dyspnea. No minimal important difference is established. A decrease in score of 1 point or greater is considered a positive change.   Pulmonary Function Assessment:  Pulmonary Function Assessment - 01/27/20 1255      Breath   Shortness of Breath Yes;Limiting activity           Exercise Target Goals: Exercise Program Goal: Individual exercise prescription set using results from initial 6 min walk test and THRR while considering  patient's activity barriers and safety.   Exercise Prescription Goal: Initial exercise prescription builds to 30-45 minutes a day of aerobic activity, 2-3 days per week.  Home exercise guidelines will be given to patient during program as part of exercise prescription that the participant will acknowledge.  Activity Barriers & Risk Stratification:  Activity Barriers & Cardiac Risk Stratification - 01/27/20 1251      Activity Barriers & Cardiac Risk Stratification   Activity Barriers Arthritis;Back Problems;Joint  Problems;Deconditioning;Muscular Weakness;Shortness of Breath;Assistive Device    Cardiac Risk Stratification High           6 Minute Walk:  6 Minute Walk    Row Name 01/27/20 1449         6 Minute Walk   Phase Initial     Distance 600 feet     Walk Time 6 minutes     # of Rest Breaks 1     MPH 1.14     METS 0.9     RPE 13     Perceived Dyspnea  15     VO2 Peak 3.16     Symptoms Yes (comment)     Comments 1 seated rest break for 2 min due to cramps in his quads. He rated the cramps a 7/10 for pain     Resting HR 57 bpm     Resting BP 108/80     Resting Oxygen Saturation  96 %     Exercise Oxygen Saturation  during 6 min walk 94 %     Max Ex. HR 89 bpm     Max Ex. BP 128/76     2 Minute Post BP 110/78       Interval HR   1 Minute HR 83     2 Minute HR 86     3 Minute HR 89     4 Minute HR 74     5 Minute HR 74     6 Minute HR 83     2 Minute Post HR 66     Interval Heart Rate? Yes       Interval Oxygen   Interval Oxygen? Yes     Baseline  Oxygen Saturation % 96 %     1 Minute Oxygen Saturation % 94 %     1 Minute Liters of Oxygen 2 L     2 Minute Oxygen Saturation % 94 %     2 Minute Liters of Oxygen 2 L     3 Minute Oxygen Saturation % 95 %     3 Minute Liters of Oxygen 2 L     4 Minute Oxygen Saturation % 97 %     4 Minute Liters of Oxygen 2 L     5 Minute Oxygen Saturation % 98 %     5 Minute Liters of Oxygen 2 L     6 Minute Oxygen Saturation % 96 %     6 Minute Liters of Oxygen 2 L     2 Minute Post Oxygen Saturation % 97 %     2 Minute Post Liters of Oxygen 2 L            Oxygen Initial Assessment:  Oxygen Initial Assessment - 01/27/20 1418      Initial 6 min Walk   Oxygen Used Continuous    Liters per minute 2      Program Oxygen Prescription   Program Oxygen Prescription Continuous    Liters per minute 2      Intervention   Short Term Goals To learn and exhibit compliance with exercise, home and travel O2 prescription;To learn and  understand importance of monitoring SPO2 with pulse oximeter and demonstrate accurate use of the pulse oximeter.;To learn and demonstrate proper pursed lip breathing techniques or other breathing techniques.;To learn and demonstrate proper use of respiratory medications;To learn and understand importance of maintaining oxygen saturations>88%    Long  Term Goals Exhibits compliance with exercise, home and travel O2 prescription;Verbalizes importance of monitoring SPO2 with pulse oximeter and return demonstration;Maintenance of O2 saturations>88%;Exhibits proper breathing techniques, such as pursed lip breathing or other method taught during program session;Compliance with respiratory medication;Demonstrates proper use of MDI's           Oxygen Re-Evaluation:  Oxygen Re-Evaluation    Row Name 02/07/20 1534             Program Oxygen Prescription   Program Oxygen Prescription Continuous       Liters per minute 2         Home Oxygen   Home Exercise Oxygen Prescription Continuous       Liters per minute 2       Home Resting Oxygen Prescription Continuous       Liters per minute 2       Compliance with Home Oxygen Use Yes         Goals/Expected Outcomes   Short Term Goals To learn and exhibit compliance with exercise, home and travel O2 prescription;To learn and understand importance of monitoring SPO2 with pulse oximeter and demonstrate accurate use of the pulse oximeter.;To learn and understand importance of maintaining oxygen saturations>88%;To learn and demonstrate proper pursed lip breathing techniques or other breathing techniques.;To learn and demonstrate proper use of respiratory medications       Long  Term Goals Demonstrates proper use of MDI's;Compliance with respiratory medication;Exhibits proper breathing techniques, such as pursed lip breathing or other method taught during program session;Maintenance of O2 saturations>88%;Verbalizes importance of monitoring SPO2 with pulse  oximeter and return demonstration;Exhibits compliance with exercise, home and travel O2 prescription       Goals/Expected Outcomes Compliance  Oxygen Discharge (Final Oxygen Re-Evaluation):  Oxygen Re-Evaluation - 02/07/20 1534      Program Oxygen Prescription   Program Oxygen Prescription Continuous    Liters per minute 2      Home Oxygen   Home Exercise Oxygen Prescription Continuous    Liters per minute 2    Home Resting Oxygen Prescription Continuous    Liters per minute 2    Compliance with Home Oxygen Use Yes      Goals/Expected Outcomes   Short Term Goals To learn and exhibit compliance with exercise, home and travel O2 prescription;To learn and understand importance of monitoring SPO2 with pulse oximeter and demonstrate accurate use of the pulse oximeter.;To learn and understand importance of maintaining oxygen saturations>88%;To learn and demonstrate proper pursed lip breathing techniques or other breathing techniques.;To learn and demonstrate proper use of respiratory medications    Long  Term Goals Demonstrates proper use of MDI's;Compliance with respiratory medication;Exhibits proper breathing techniques, such as pursed lip breathing or other method taught during program session;Maintenance of O2 saturations>88%;Verbalizes importance of monitoring SPO2 with pulse oximeter and return demonstration;Exhibits compliance with exercise, home and travel O2 prescription    Goals/Expected Outcomes Compliance           Initial Exercise Prescription:  Initial Exercise Prescription - 01/27/20 1400      Date of Initial Exercise RX and Referring Provider   Date 01/27/20    Referring Provider Dr. Shearon Stalls     Expected Discharge Date 05/31/20      Oxygen   Oxygen Continuous    Liters 2      NuStep   Level 1    SPM 60    Minutes 17      Arm Ergometer   Level 1    RPM 60    Minutes 22      Prescription Details   Frequency (times per week) 2    Duration  Progress to 30 minutes of continuous aerobic without signs/symptoms of physical distress      Intensity   THRR 40-80% of Max Heartrate 56-112    Ratings of Perceived Exertion 11-13    Perceived Dyspnea 0-4      Resistance Training   Training Prescription Yes    Weight 2 lbs    Reps 10-15           Perform Capillary Blood Glucose checks as needed.  Exercise Prescription Changes:   Exercise Prescription Changes    Row Name 01/31/20 1600 02/07/20 1400           Response to Exercise   Blood Pressure (Admit) 128/78 140/72      Blood Pressure (Exercise) 148/80 148/68      Blood Pressure (Exit) 136/72 122/76      Heart Rate (Admit) 62 bpm 71 bpm      Heart Rate (Exercise) 75 bpm 83 bpm      Heart Rate (Exit) 71 bpm 86 bpm      Oxygen Saturation (Admit) 97 % 96 %      Oxygen Saturation (Exercise) 94 % 95 %      Oxygen Saturation (Exit) 97 % 95 %      Rating of Perceived Exertion (Exercise) 15 15      Perceived Dyspnea (Exercise) 15 15      Duration Continue with 30 min of aerobic exercise without signs/symptoms of physical distress. Continue with 30 min of aerobic exercise without signs/symptoms of physical distress.      Intensity THRR  unchanged THRR unchanged        Progression   Progression Continue to progress workloads to maintain intensity without signs/symptoms of physical distress. Continue to progress workloads to maintain intensity without signs/symptoms of physical distress.        Resistance Training   Training Prescription Yes Yes      Weight 2 lbs 2 lbs      Reps 10-15 10-15      Time 10 Minutes 10 Minutes        Oxygen   Oxygen Continuous Continuous      Liters 2 2        NuStep   Level 1 1      SPM 78 74      Minutes 17 17      METs 1.7 1.4        Arm Ergometer   Level 1 1      RPM 49 41      Minutes 22 22      METs 1.5 1.3             Exercise Comments:   Exercise Comments    Row Name 01/31/20 1604           Exercise Comments Pt  completed his first exercise session in pulmonary rehab today. He is severely deconditioned. He complained of shoulder pain during the warm up and was unable to do a portion of the hand weight activities. He complained of cramps in his quads while on the stepper. He was otherwise able to tolerate the exercise session.              Exercise Goals and Review:   Exercise Goals    Row Name 01/27/20 1452 02/07/20 1535           Exercise Goals   Increase Physical Activity Yes Yes      Intervention Provide advice, education, support and counseling about physical activity/exercise needs.;Develop an individualized exercise prescription for aerobic and resistive training based on initial evaluation findings, risk stratification, comorbidities and participant's personal goals. Provide advice, education, support and counseling about physical activity/exercise needs.;Develop an individualized exercise prescription for aerobic and resistive training based on initial evaluation findings, risk stratification, comorbidities and participant's personal goals.      Expected Outcomes Short Term: Attend rehab on a regular basis to increase amount of physical activity.;Long Term: Add in home exercise to make exercise part of routine and to increase amount of physical activity.;Long Term: Exercising regularly at least 3-5 days a week. Short Term: Attend rehab on a regular basis to increase amount of physical activity.;Long Term: Add in home exercise to make exercise part of routine and to increase amount of physical activity.;Long Term: Exercising regularly at least 3-5 days a week.      Increase Strength and Stamina Yes Yes      Intervention Provide advice, education, support and counseling about physical activity/exercise needs.;Develop an individualized exercise prescription for aerobic and resistive training based on initial evaluation findings, risk stratification, comorbidities and participant's personal goals.  Provide advice, education, support and counseling about physical activity/exercise needs.;Develop an individualized exercise prescription for aerobic and resistive training based on initial evaluation findings, risk stratification, comorbidities and participant's personal goals.      Expected Outcomes Short Term: Increase workloads from initial exercise prescription for resistance, speed, and METs.;Short Term: Perform resistance training exercises routinely during rehab and add in resistance training at home;Long Term: Improve cardiorespiratory fitness, muscular endurance and strength as  measured by increased METs and functional capacity (6MWT) Short Term: Increase workloads from initial exercise prescription for resistance, speed, and METs.;Short Term: Perform resistance training exercises routinely during rehab and add in resistance training at home;Long Term: Improve cardiorespiratory fitness, muscular endurance and strength as measured by increased METs and functional capacity (6MWT)      Able to understand and use rate of perceived exertion (RPE) scale Yes Yes      Intervention Provide education and explanation on how to use RPE scale Provide education and explanation on how to use RPE scale      Expected Outcomes Short Term: Able to use RPE daily in rehab to express subjective intensity level;Long Term:  Able to use RPE to guide intensity level when exercising independently Short Term: Able to use RPE daily in rehab to express subjective intensity level;Long Term:  Able to use RPE to guide intensity level when exercising independently      Able to understand and use Dyspnea scale Yes Yes      Intervention Provide education and explanation on how to use Dyspnea scale Provide education and explanation on how to use Dyspnea scale      Expected Outcomes Short Term: Able to use Dyspnea scale daily in rehab to express subjective sense of shortness of breath during exertion;Long Term: Able to use Dyspnea scale  to guide intensity level when exercising independently Short Term: Able to use Dyspnea scale daily in rehab to express subjective sense of shortness of breath during exertion;Long Term: Able to use Dyspnea scale to guide intensity level when exercising independently      Knowledge and understanding of Target Heart Rate Range (THRR) Yes Yes      Intervention Provide education and explanation of THRR including how the numbers were predicted and where they are located for reference Provide education and explanation of THRR including how the numbers were predicted and where they are located for reference      Expected Outcomes Short Term: Able to state/look up THRR;Long Term: Able to use THRR to govern intensity when exercising independently;Short Term: Able to use daily as guideline for intensity in rehab Short Term: Able to state/look up THRR;Long Term: Able to use THRR to govern intensity when exercising independently;Short Term: Able to use daily as guideline for intensity in rehab      Understanding of Exercise Prescription Yes Yes      Intervention Provide education, explanation, and written materials on patient's individual exercise prescription Provide education, explanation, and written materials on patient's individual exercise prescription      Expected Outcomes Short Term: Able to explain program exercise prescription;Long Term: Able to explain home exercise prescription to exercise independently Short Term: Able to explain program exercise prescription;Long Term: Able to explain home exercise prescription to exercise independently             Exercise Goals Re-Evaluation :  Exercise Goals Re-Evaluation    Row Name 02/07/20 1535             Exercise Goal Re-Evaluation   Exercise Goals Review Increase Physical Activity;Increase Strength and Stamina;Able to understand and use rate of perceived exertion (RPE) scale;Able to understand and use Dyspnea scale;Knowledge and understanding of  Target Heart Rate Range (THRR);Understanding of Exercise Prescription       Comments Pt has completed 3 exercise sessions. Pt has been complaining of pain in his left quad. He was wheezing today and did not bring his rescue inhaler. We encouraged him to bring his  rescue inhaler to rehab. He also complains of left shoulder pain. He is exercising at 1.4 METs on the NuStep. Will continue to monitor and progress exercise as able.       Expected Outcomes Through exercising at rehab and at home patient will reach their goals.              Discharge Exercise Prescription (Final Exercise Prescription Changes):  Exercise Prescription Changes - 02/07/20 1400      Response to Exercise   Blood Pressure (Admit) 140/72    Blood Pressure (Exercise) 148/68    Blood Pressure (Exit) 122/76    Heart Rate (Admit) 71 bpm    Heart Rate (Exercise) 83 bpm    Heart Rate (Exit) 86 bpm    Oxygen Saturation (Admit) 96 %    Oxygen Saturation (Exercise) 95 %    Oxygen Saturation (Exit) 95 %    Rating of Perceived Exertion (Exercise) 15    Perceived Dyspnea (Exercise) 15    Duration Continue with 30 min of aerobic exercise without signs/symptoms of physical distress.    Intensity THRR unchanged      Progression   Progression Continue to progress workloads to maintain intensity without signs/symptoms of physical distress.      Resistance Training   Training Prescription Yes    Weight 2 lbs    Reps 10-15    Time 10 Minutes      Oxygen   Oxygen Continuous    Liters 2      NuStep   Level 1    SPM 74    Minutes 17    METs 1.4      Arm Ergometer   Level 1    RPM 41    Minutes 22    METs 1.3           Nutrition:  Target Goals: Understanding of nutrition guidelines, daily intake of sodium 1500mg , cholesterol 200mg , calories 30% from fat and 7% or less from saturated fats, daily to have 5 or more servings of fruits and vegetables.  Biometrics:  Pre Biometrics - 02/07/20 1453      Pre  Biometrics   Weight 101.6 kg    BMI (Calculated) 31.25            Nutrition Therapy Plan and Nutrition Goals:  Nutrition Therapy & Goals - 02/06/20 1325      Personal Nutrition Goals   Comments Patient scored 21 on his medficts diet assessment. We will continue to provide heart healthy nutritional education through hand-outs.      Intervention Plan   Intervention Nutrition handout(s) given to patient.           Nutrition Assessments:  Nutrition Assessments - 01/27/20 1418      MEDFICTS Scores   Pre Score 21          MEDIFICTS Score Key:  ?70 Need to make dietary changes   40-70 Heart Healthy Diet  ? 40 Therapeutic Level Cholesterol Diet   Picture Your Plate Scores:  <17 Unhealthy dietary pattern with much room for improvement.  41-50 Dietary pattern unlikely to meet recommendations for good health and room for improvement.  51-60 More healthful dietary pattern, with some room for improvement.   >60 Healthy dietary pattern, although there may be some specific behaviors that could be improved.    Nutrition Goals Re-Evaluation:   Nutrition Goals Discharge (Final Nutrition Goals Re-Evaluation):   Psychosocial: Target Goals: Acknowledge presence or absence of significant depression and/or stress,  maximize coping skills, provide positive support system. Participant is able to verbalize types and ability to use techniques and skills needed for reducing stress and depression.  Initial Review & Psychosocial Screening:  Initial Psych Review & Screening - 01/27/20 1256      Initial Review   Current issues with None Identified      Family Dynamics   Good Support System? Yes    Comments His wife is his support system. His dog just passed away who was also part of his support system. His daughter also supports him when she can. She is busy with her two children.      Barriers   Psychosocial barriers to participate in program There are no identifiable barriers  or psychosocial needs.      Screening Interventions   Interventions Encouraged to exercise;Other (comment)           Quality of Life Scores:  Quality of Life - 01/27/20 1449      Quality of Life   Select Quality of Life      Quality of Life Scores   Health/Function Pre 20.43 %    Socioeconomic Pre 25.75 %    Psych/Spiritual Pre 26.36 %    Family Pre 27.6 %    GLOBAL Pre 23.74 %          Scores of 19 and below usually indicate a poorer quality of life in these areas.  A difference of  2-3 points is a clinically meaningful difference.  A difference of 2-3 points in the total score of the Quality of Life Index has been associated with significant improvement in overall quality of life, self-image, physical symptoms, and general health in studies assessing change in quality of life.   PHQ-9: Recent Review Flowsheet Data    Depression screen [facility] 2/9 01/27/2020 05/07/2017   Decreased Interest 0 0   Down, Depressed, Hopeless 0 0   PHQ - 2 Score 0 0   Altered sleeping 0 -   Tired, decreased energy 1 -   Change in appetite 0 -   Feeling bad or failure about yourself  0 -   Trouble concentrating 0 -   Moving slowly or fidgety/restless 0 -   Suicidal thoughts 0 -   PHQ-9 Score 1 -   Difficult doing work/chores Somewhat difficult -     Interpretation of Total Score  Total Score Depression Severity:  1-4 = Minimal depression, 5-9 = Mild depression, 10-14 = Moderate depression, 15-19 = Moderately severe depression, 20-27 = Severe depression   Psychosocial Evaluation and Intervention:   Psychosocial Re-Evaluation:  Psychosocial Re-Evaluation    [place] Name 02/06/20 1327             Psychosocial Re-Evaluation   Current issues with None Identified       Comments Patient has no psychosocial issues identified. His initial QOL score was 23.74 and his PHQ-9 score was 1. He has a positive attitude and works hard duing his sessions. Will continue to monitor.       Expected Outcomes  Patient will have no psychosocial issues identified at discharge.       Interventions Encouraged to attend Pulmonary Rehabilitation for the exercise;Relaxation education;Stress management education       Continue Psychosocial Services  No Follow up required              Psychosocial Discharge (Final Psychosocial Re-Evaluation):  Psychosocial Re-Evaluation - 02/06/20 1327      Psychosocial Re-Evaluation   Current  issues with None Identified    Comments Patient has no psychosocial issues identified. His initial QOL score was 23.74 and his PHQ-9 score was 1. He has a positive attitude and works hard duing his sessions. Will continue to monitor.    Expected Outcomes Patient will have no psychosocial issues identified at discharge.    Interventions Encouraged to attend Pulmonary Rehabilitation for the exercise;Relaxation education;Stress management education    Continue Psychosocial Services  No Follow up required            Education: Education Goals: Education classes will be provided on a weekly basis, covering required topics. Participant will state understanding/return demonstration of topics presented.  Learning Barriers/Preferences:   Education Topics: How Lungs Work and Diseases: - Discuss the anatomy of the lungs and diseases that can affect the lungs, such as COPD.   Exercise: -Discuss the importance of exercise, FITT principles of exercise, normal and abnormal responses to exercise, and how to exercise safely.   Environmental Irritants: -Discuss types of environmental irritants and how to limit exposure to environmental irritants.   Meds/Inhalers and oxygen: - Discuss respiratory medications, definition of an inhaler and oxygen, and the proper way to use an inhaler and oxygen.   Energy Saving Techniques: - Discuss methods to conserve energy and decrease shortness of breath when performing activities of daily living.    Bronchial Hygiene / Breathing  Techniques: - Discuss breathing mechanics, pursed-lip breathing technique,  proper posture, effective ways to clear airways, and other functional breathing techniques   Cleaning Equipment: - Provides group verbal and written instruction about the health risks of elevated stress, cause of high stress, and healthy ways to reduce stress.   PULMONARY REHAB CHRONIC OBSTRUCTIVE PULMONARY DISEASE from 02/02/2020 in [place]  Date 02/02/20  Educator DJ  Instruction Review Code 1- Verbalizes Understanding      Nutrition I: Fats: - Discuss the types of cholesterol, what cholesterol does to the body, and how cholesterol levels can be controlled.   Nutrition II: Labels: -Discuss the different components of food labels and how to read food labels.   Respiratory Infections: - Discuss the signs and symptoms of respiratory infections, ways to prevent respiratory infections, and the importance of seeking medical treatment when having a respiratory infection.   Stress I: Signs and Symptoms: - Discuss the causes of stress, how stress may lead to anxiety and depression, and ways to limit stress.   Stress II: Relaxation: -Discuss relaxation techniques to limit stress.   Oxygen for Home/Travel: - Discuss how to prepare for travel when on oxygen and proper ways to transport and store oxygen to ensure safety.   Knowledge Questionnaire Score:  Knowledge Questionnaire Score - 01/27/20 1419      Knowledge Questionnaire Score   Pre Score 16/18           Core Components/Risk Factors/Patient Goals at Admission:  Personal Goals and Risk Factors at Admission - 01/27/20 1419      Core Components/Risk Factors/Patient Goals on Admission   Improve shortness of breath with ADL's Yes    Intervention Provide education, individualized exercise plan and daily activity instruction to help decrease symptoms of SOB with activities of daily living.    Expected Outcomes Short Term:  Improve cardiorespiratory fitness to achieve a reduction of symptoms when performing ADLs;Long Term: Be able to perform more ADLs without symptoms or delay the onset of symptoms    Heart Failure Yes    Intervention Provide a combined exercise and  nutrition program that is supplemented with education, support and counseling about heart failure. Directed toward relieving symptoms such as shortness of breath, decreased exercise tolerance, and extremity edema.    Expected Outcomes Improve functional capacity of life;Short term: Attendance in program 2-3 days a week with increased exercise capacity. Reported lower sodium intake. Reported increased fruit and vegetable intake. Reports medication compliance.;Short term: Daily weights obtained and reported for increase. Utilizing diuretic protocols set by physician.;Long term: Adoption of self-care skills and reduction of barriers for early signs and symptoms recognition and intervention leading to self-care maintenance.           Core Components/Risk Factors/Patient Goals Review:   Goals and Risk Factor Review    [place] Name 02/06/20 1328             Core Components/Risk Factors/Patient Goals Review   Review Patient is new to the program completing 2 sessions. 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 His personal goals are to return to his ADL's and returnto walking long distances. Will continue to monitor for progress.       Expected Outcomes Patient will complete the program meeting both program and personal goals.              Core Components/Risk Factors/Patient Goals at Discharge (Final Review):   Goals and Risk Factor Review - 02/06/20 1328      Core Components/Risk Factors/Patient Goals Review   Review Patient is new to the program completing 2 sessions. 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 His personal goals are to return to his ADL's and returnto walking long distances. Will continue to monitor for progress.     Expected Outcomes Patient will complete the program meeting both program and personal goals.           ITP Comments:   Comments: ITP REVIEW Pt is making expected progress toward pulmonary rehab goals after completing 4 sessions. Recommend continued exercise, life style modification, education, and utilization of breathing techniques to increase stamina and strength and decrease shortness of breath with exertion.

## 2020-02-10 MED ORDER — PREDNISONE 20 MG PO TABS: 40.0000 mg | ORAL_TABLET | Freq: Every day | ORAL | 0 refills | Status: DC

## 2020-02-10 NOTE — Telephone Encounter (Signed)
Return call From [facility] on this pt [phone].Hillery Hunter

## 2020-02-10 NOTE — Progress Notes (Signed)
Derrick Moon's resting SpO2 on room air was 93%. He walked about 200 ft and then his SpO2 decreased to 87%. He was then placed on 2L [place] and SpO2 recovered to 92%. Derrick Moon needs home oxygen to maintain SpO2 at adequate levels while exerting himself.   Derrick Register MS, ACSM CEP

## 2020-02-10 NOTE — Telephone Encounter (Addendum)
Spoke with Derrick Moon at [facility] Pul[facility]  He walked pt yesterday and states he did qualify for o2-  Derrick Moon's resting SpO2 on room air was 93%. He walked about 200 ft and then his SpO2 decreased to 87%. He was then placed on 2L [place] and SpO2 recovered to 92%. Derrick Moon needs home oxygen to maintain SpO2 at adequate levels while exerting himself.  He did note that the pt was wheezing a lot, and had a lot of dyspnea  He states during the walk, he also c/o feeling light headed and having a HA  I called and spoke with the pt  He states he has noticed increased chest congestion for the past wk  He has been coughing more and producing more sputum than usual- clear  He states his SOB has been worse just over the past 2 days with wheezing  He is using his symbicort and singulair daily and duoneb and albuterol hfa both 3-4 x per day  He denies having any f/c/s, body aches Has been vaccinated against covid  Please advise any recommendations, thanks!

## 2020-02-10 NOTE — Telephone Encounter (Signed)
Called and spoke with patient, clarified that patient is already on oxygen at 2.5 L with [employer].  Advised patient to wear his oxygen with exertion to keep sats above 88%.  Advised that Dr. Shearon Stalls has sent in Prednisone to his pharmacy to take 2 tabs (40mg  daily for 5 days).  Patient verbalized understanding.  Called and left a vm for Kirk Ruths advising him that the patient is already on oxygen at 2.5 L.  Nothing further needed.  Dr. Shearon Stalls, Just an FYI, this patient is already on 2.5 L of oxygen, sats are 97% on 2.5 L.  Patient was not on his oxygen at pulmonary rehab.  I also called and left a vm for the pulmonary rehab staff as well to make them aware.  Thank you.

## 2020-02-10 NOTE — Telephone Encounter (Signed)
Notes from pulmonary rehab copied and pasted in case coordination note.  Walk note updated and order sent to [facility].  Noting further needed.

## 2020-02-10 NOTE — Telephone Encounter (Signed)
Can we please prescribe [facility] for him? Given his other symptoms, sounds like COPD exacerbation. Will prescribe a course of prednisone.

## 2020-02-10 NOTE — Telephone Encounter (Signed)
Thanks heather. Can you forward the results of his walk test to [facility] as requested in the previous encounter?

## 2020-02-14 ENCOUNTER — Encounter (HOSPITAL_COMMUNITY): Payer: PPO

## 2020-02-16 ENCOUNTER — Encounter (HOSPITAL_COMMUNITY): Payer: PPO

## 2020-02-21 ENCOUNTER — Encounter (HOSPITAL_COMMUNITY)
Admission: RE | Admit: 2020-02-21 | Discharge: 2020-02-21 | Disposition: A | Discharge status: Home / Self Care | Payer: PPO | Source: Ambulatory Visit | Attending: Internal Medicine | Admitting: Internal Medicine

## 2020-02-21 ENCOUNTER — Other Ambulatory Visit: Payer: Self-pay

## 2020-02-21 VITALS — Wt 222.4 lb

## 2020-02-21 DIAGNOSIS — J449 Chronic obstructive pulmonary disease, unspecified: Secondary | ICD-10-CM | POA: Diagnosis not present

## 2020-02-21 NOTE — Progress Notes (Signed)
Daily Session Note  Patient Details  Name: Derrick Moon MRN: [id number] Date of Birth: 1941-03-10 Referring Provider:     PULMONARY REHAB COPD ORIENTATION from 01/27/2020 in [place]  Referring Provider Dr. Shearon Stalls       Encounter Date: 02/21/2020  Check In:  Session Check In - 02/21/20 1330      Check-In   Supervising physician immediately available to respond to emergencies [facility] MD immediately available    Physician(s) Dr. Domenic Polite    Location [facility]    Staff Present Geanie Cooley, RN;Dalton Kris Mouton, MS, ACSM-CEP, Exercise Physiologist    Virtual Visit No    Medication changes reported     No    Fall or balance concerns reported    No    Tobacco Cessation No Change    Warm-up and Cool-down Performed as group-led instruction    Resistance Training Performed Yes    VAD Patient? No    PAD/SET Patient? No      Pain Assessment   Currently in Pain? Yes    Pain Score 2     Pain Location Shoulder    Pain Orientation Left    Pain Descriptors / Indicators Constant    Pain Onset More than a month ago    Pain Frequency Intermittent    Multiple Pain Sites No           Capillary Blood Glucose: No results found for this or any previous visit (from the past 24 hour(s)).    Social History   Tobacco Use  Smoking Status Current Some Day Smoker  . Packs/day: 0.25  . Years: 45.00  . Pack years: 11.25  . Types: Cigars  . Last attempt to quit: 01/07/2019  . Years since quitting: 1.1  Smokeless Tobacco Never Used  Tobacco Comment   smokes 1/3 of a cigar a day- 12/27/2019    Goals Met:  Proper associated with RPD/PD & O2 Sat Independence with exercise equipment Improved SOB with ADL's Using PLB without cueing & demonstrates good technique Exercise tolerated well No report of cardiac concerns or symptoms Strength training completed today  Goals Unmet:  Not Applicable  Comments: check out @ 14:30   Dr. Kathie Dike is Medical Director for [facility] Pulmonary Rehab.

## 2020-02-23 ENCOUNTER — Encounter (HOSPITAL_COMMUNITY)
Admission: RE | Admit: 2020-02-23 | Discharge: 2020-02-23 | Disposition: A | Discharge status: Home / Self Care | Payer: PPO | Source: Ambulatory Visit | Attending: Internal Medicine | Admitting: Internal Medicine

## 2020-02-23 ENCOUNTER — Telehealth: Payer: Self-pay | Admitting: Internal Medicine

## 2020-02-23 ENCOUNTER — Other Ambulatory Visit: Payer: Self-pay

## 2020-02-23 DIAGNOSIS — J449 Chronic obstructive pulmonary disease, unspecified: Secondary | ICD-10-CM | POA: Diagnosis not present

## 2020-02-23 NOTE — Progress Notes (Signed)
Daily Session Note  Patient Details  Name: Derrick Moon MRN: [id number] Date of Birth: [birth year] Referring Provider:     PULMONARY REHAB COPD ORIENTATION from 01/27/2020 in [place]  Referring Provider Dr. Shearon Stalls       Encounter Date: 02/23/2020  Check In:  Session Check In - 02/23/20 1330      Check-In   Supervising physician immediately available to respond to emergencies [facility] MD immediately available    Physician(s) Dr. Domenic Polite    Location [facility]    Staff Present Aundra Dubin, RN, BSN;Phyllis Billingsley, RN;Dalton Kris Mouton, MS, ACSM-CEP, Exercise Physiologist;[place] Audria Nine, MS, Exercise Physiologist    Virtual Visit No    Medication changes reported     No    Fall or balance concerns reported    No    Tobacco Cessation No Change    Warm-up and Cool-down Performed as group-led instruction    Resistance Training Performed Yes    VAD Patient? No    PAD/SET Patient? No      Pain Assessment   Currently in Pain? No/denies    Multiple Pain Sites No           Capillary Blood Glucose: No results found for this or any previous visit (from the past 24 hour(s)).    Social History   Tobacco Use  Smoking Status Current Some Day Smoker  . Packs/day: 0.25  . Years: 45.00  . Pack years: 11.25  . Types: Cigars  . Last attempt to quit: 01/07/2019  . Years since quitting: 1.1  Smokeless Tobacco Never Used  Tobacco Comment   smokes 1/3 of a cigar a day- 12/27/2019    Goals Met:  Proper associated with RPD/PD & O2 Sat Independence with exercise equipment Improved SOB with ADL's Using PLB without cueing & demonstrates good technique Exercise tolerated well No report of cardiac concerns or symptoms Strength training completed today  Goals Unmet:  Not Applicable  Comments: Check out 1430.   Dr. Kathie Dike is Medical Director for [facility] Pulmonary Rehab.

## 2020-02-23 NOTE — Telephone Encounter (Signed)
Called and spoke with pts wife, [name] and she stated that the pt will be starting prednisone on 12/3.  She stated that ND wanted to be made aware of when he started this.  FYI

## 2020-02-28 ENCOUNTER — Encounter (HOSPITAL_COMMUNITY)
Admission: RE | Admit: 2020-02-28 | Discharge: 2020-02-28 | Disposition: A | Discharge status: Home / Self Care | Payer: PPO | Source: Ambulatory Visit | Attending: Internal Medicine | Admitting: Internal Medicine

## 2020-02-28 ENCOUNTER — Other Ambulatory Visit: Payer: Self-pay

## 2020-02-28 DIAGNOSIS — J449 Chronic obstructive pulmonary disease, unspecified: Secondary | ICD-10-CM | POA: Diagnosis not present

## 2020-02-28 NOTE — Progress Notes (Signed)
Daily Session Note  Patient Details  Name: Derrick Moon MRN: [id number] Date of Birth: 1941/02/25 Referring Provider:     PULMONARY REHAB COPD ORIENTATION from 01/27/2020 in [place]  Referring Provider Dr. Shearon Stalls       Encounter Date: 02/28/2020  Check In:  Session Check In - 02/28/20 1330      Check-In   Supervising physician immediately available to respond to emergencies [facility] MD immediately available    Physician(s) Dr. Domenic Polite    Location [facility]    Staff Present Cathren Harsh, MS, Exercise Physiologist;Dalton Kris Mouton, MS, ACSM-CEP, Exercise Physiologist;Phyllis Billingsley, RN    Virtual Visit No    Medication changes reported     No    Fall or balance concerns reported    No    Tobacco Cessation No Change    Warm-up and Cool-down Performed as group-led instruction    Resistance Training Performed Yes    VAD Patient? No    PAD/SET Patient? No      Pain Assessment   Currently in Pain? Yes    Pain Score 3     Pain Location Leg    Pain Orientation Left    Pain Descriptors / Indicators Constant    Multiple Pain Sites No           Capillary Blood Glucose: No results found for this or any previous visit (from the past 24 hour(s)).    Social History   Tobacco Use  Smoking Status Current Some Day Smoker  . Packs/day: 0.25  . Years: 45.00  . Pack years: 11.25  . Types: Cigars  . Last attempt to quit: 01/07/2019  . Years since quitting: 1.1  Smokeless Tobacco Never Used  Tobacco Comment   smokes 1/3 of a cigar a day- 12/27/2019    Goals Met:  Independence with exercise equipment Exercise tolerated well No report of cardiac concerns or symptoms Strength training completed today  Goals Unmet:  Not Applicable  Comments: check out 1430   Dr. Kathie Dike is Medical Director for [facility] Pulmonary Rehab.

## 2020-03-01 ENCOUNTER — Encounter (HOSPITAL_COMMUNITY)
Admission: RE | Admit: 2020-03-01 | Discharge: 2020-03-01 | Disposition: A | Discharge status: Home / Self Care | Payer: PPO | Source: Ambulatory Visit | Attending: Internal Medicine | Admitting: Internal Medicine

## 2020-03-01 ENCOUNTER — Other Ambulatory Visit: Payer: Self-pay

## 2020-03-01 DIAGNOSIS — J449 Chronic obstructive pulmonary disease, unspecified: Secondary | ICD-10-CM | POA: Diagnosis not present

## 2020-03-01 NOTE — Progress Notes (Signed)
Daily Session Note  Patient Details  Name: Derrick Moon MRN: [id number] Date of Birth: 03/30/1940 Referring Provider:   Flowsheet Row PULMONARY REHAB COPD ORIENTATION from 01/27/2020 in [place]  Referring Provider Dr. Shearon Stalls       Encounter Date: 03/01/2020  Check In:  Session Check In - 03/01/20 1330      Check-In   Supervising physician immediately available to respond to emergencies [facility] MD immediately available    Physician(s) Dr. Harl Bowie    Location [facility]    Staff Present Cathren Harsh, MS, Exercise Physiologist;Phyllis Billingsley, RN    Virtual Visit No    Medication changes reported     No    Fall or balance concerns reported    No    Tobacco Cessation No Change    Warm-up and Cool-down Performed as group-led instruction    Resistance Training Performed Yes    VAD Patient? No    PAD/SET Patient? No      Pain Assessment   Currently in Pain? Yes    Pain Score 7     Pain Location Knee    Pain Orientation Left    Pain Descriptors / Indicators Constant    Multiple Pain Sites No           Capillary Blood Glucose: No results found for this or any previous visit (from the past 24 hour(s)).    Social History   Tobacco Use  Smoking Status Current Some Day Smoker  . Packs/day: 0.25  . Years: 45.00  . Pack years: 11.25  . Types: Cigars  . Last attempt to quit: 01/07/2019  . Years since quitting: 1.1  Smokeless Tobacco Never Used  Tobacco Comment   smokes 1/3 of a cigar a day- 12/27/2019    Goals Met:  Independence with exercise equipment Exercise tolerated well No report of cardiac concerns or symptoms Strength training completed today  Goals Unmet:  Not Applicable  Comments: check out 1430   Dr. Kathie Dike is Medical Director for [facility] Pulmonary Rehab.

## 2020-03-06 ENCOUNTER — Encounter (HOSPITAL_COMMUNITY)
Admission: RE | Admit: 2020-03-06 | Discharge: 2020-03-06 | Disposition: A | Discharge status: Home / Self Care | Payer: PPO | Source: Ambulatory Visit | Attending: Internal Medicine | Admitting: Internal Medicine

## 2020-03-06 ENCOUNTER — Other Ambulatory Visit: Payer: Self-pay

## 2020-03-06 VITALS — Wt 220.7 lb

## 2020-03-06 DIAGNOSIS — J449 Chronic obstructive pulmonary disease, unspecified: Secondary | ICD-10-CM

## 2020-03-06 NOTE — Progress Notes (Signed)
Daily Session Note  Patient Details  Name: Derrick Moon MRN: [id number] Date of Birth: May 05, 1940 Referring Provider:   Flowsheet Row PULMONARY REHAB COPD ORIENTATION from 01/27/2020 in [place]  Referring Provider Dr. Shearon Stalls       Encounter Date: 03/06/2020  Check In:  Session Check In - 03/06/20 1330      Check-In   Supervising physician immediately available to respond to emergencies [facility] MD immediately available    Physician(s) Dr. Harl Bowie    [facility]    Staff Present Cathren Harsh, MS, Exercise Physiologist;Phyllis Billingsley, RN    Virtual Visit No    Medication changes reported     No    Fall or balance concerns reported    No    Tobacco Cessation No Change    Warm-up and Cool-down Performed as group-led instruction    Resistance Training Performed Yes    VAD Patient? No    PAD/SET Patient? No      Pain Assessment   Currently in Pain? No/denies    Multiple Pain Sites No           Capillary Blood Glucose: No results found for this or any previous visit (from the past 24 hour(s)).    Social History   Tobacco Use  Smoking Status Current Some Day Smoker  . Packs/day: 0.25  . Years: 45.00  . Pack years: 11.25  . Types: Cigars  . Last attempt to quit: 01/07/2019  . Years since quitting: 1.1  Smokeless Tobacco Never Used  Tobacco Comment   smokes 1/3 of a cigar a day- 12/27/2019    Goals Met:  Independence with exercise equipment Exercise tolerated well No report of cardiac concerns or symptoms Strength training completed today  Goals Unmet:  Not Applicable  Comments: check out 1430   Dr. Kathie Dike is Medical Director for [facility] Pulmonary Rehab.

## 2020-03-08 ENCOUNTER — Other Ambulatory Visit: Payer: Self-pay

## 2020-03-08 ENCOUNTER — Encounter (HOSPITAL_COMMUNITY)
Admission: RE | Admit: 2020-03-08 | Discharge: 2020-03-08 | Disposition: A | Discharge status: Home / Self Care | Payer: PPO | Source: Ambulatory Visit | Attending: Internal Medicine | Admitting: Internal Medicine

## 2020-03-08 DIAGNOSIS — J449 Chronic obstructive pulmonary disease, unspecified: Secondary | ICD-10-CM | POA: Diagnosis not present

## 2020-03-08 NOTE — Progress Notes (Signed)
Daily Session Note  Patient Details  Name: Derrick Moon MRN: [id number] Date of Birth: Sep 26, 1940 Referring Provider:   Flowsheet Row PULMONARY REHAB COPD ORIENTATION from 01/27/2020 in [place]  Referring Provider Dr. Shearon Stalls       Encounter Date: 03/08/2020  Check In:  Session Check In - 03/08/20 1330      Check-In   Supervising physician immediately available to respond to emergencies [facility] MD immediately available    Physician(s) Dr. Domenic Polite    Location [facility]    Staff Present Cathren Harsh, MS, Exercise Physiologist;Phyllis Billingsley, Edison Simon, MS, ACSM-CEP, Exercise Physiologist    Virtual Visit No    Medication changes reported     No    Fall or balance concerns reported    No    Tobacco Cessation No Change    Warm-up and Cool-down Performed as group-led instruction    Resistance Training Performed Yes    VAD Patient? No    PAD/SET Patient? No      Pain Assessment   Currently in Pain? No/denies    Pain Score 0-No pain    Multiple Pain Sites No           Capillary Blood Glucose: No results found for this or any previous visit (from the past 24 hour(s)).    Social History   Tobacco Use  Smoking Status Current Some Day Smoker  . Packs/day: 0.25  . Years: 45.00  . Pack years: 11.25  . Types: Cigars  . Last attempt to quit: 01/07/2019  . Years since quitting: 1.1  Smokeless Tobacco Never Used  Tobacco Comment   smokes 1/3 of a cigar a day- 12/27/2019    Goals Met:  Independence with exercise equipment Exercise tolerated well No report of cardiac concerns or symptoms Strength training completed today  Goals Unmet:  Not Applicable  Comments: checkout time is 1430   Dr. Kathie Dike is Medical Director for [facility] Pulmonary Rehab.

## 2020-03-08 NOTE — Progress Notes (Signed)
Pulmonary Individual Treatment Plan  Patient Details  Name: Derrick Moon MRN: [id number] Date of Birth: 05/20/1940 Referring Provider:   Flowsheet Row PULMONARY REHAB COPD ORIENTATION from 01/27/2020 in [facility]  Referring Provider Dr. Shearon Stalls       Initial Encounter Date:  Flowsheet Row PULMONARY REHAB COPD ORIENTATION from 01/27/2020 in [facility]  Date 01/27/20      Visit Diagnosis: COPD with chronic bronchitis and emphysema ([place])  Patient's Home Medications on Admission:   Current Outpatient Medications:    acetaminophen (TYLENOL) 500 MG tablet, Take 1,000 mg by mouth every 6 (six) hours as needed (for pain or headaches). , Disp: , Rfl:    albuterol (PROVENTIL HFA;VENTOLIN HFA) 108 (90 BASE) MCG/ACT inhaler, Inhale 1-2 puffs into the lungs every 6 (six) hours as needed for wheezing or shortness of breath. Reported on 10/03/2015, Disp: , Rfl:    amLODipine (NORVASC) 5 MG tablet, Take 5 mg by mouth daily., Disp: , Rfl:    apixaban (ELIQUIS) 5 MG TABS tablet, Take 1 tablet (5 mg total) 2 (two) times daily by mouth., Disp: 60 tablet, Rfl: 0   baclofen (LIORESAL) 10 MG tablet, Take 10 mg by mouth 2 (two) times daily., Disp: , Rfl:    budesonide-formoterol (SYMBICORT) 160-4.5 MCG/ACT inhaler, Inhale 2 puffs into the lungs 2 (two) times daily., Disp: 1 Inhaler, Rfl: 0   diphenhydrAMINE (BENADRYL) 25 MG tablet, Take 25 mg by mouth every 6 (six) hours as needed for itching or allergies. , Disp: , Rfl:    docusate sodium (COLACE) 100 MG capsule, Take 1 capsule (100 mg total) by mouth 2 (two) times daily. (Patient taking differently: Take 100 mg by mouth daily as needed. ), Disp: 10 capsule, Rfl: 0   famotidine (PEPCID) 20 MG tablet, TAKE (1) TABLET BY MOUTH AT BEDTIME. (Patient taking differently: Take 20 mg by mouth at bedtime. TAKE (1) TABLET BY MOUTH AT BEDTIME.), Disp: 30 tablet, Rfl: 0   guaiFENesin (MUCINEX) 600 MG 12 hr tablet,  Take 600 mg by mouth 2 (two) times daily as needed for cough or to loosen phlegm., Disp: , Rfl:    hydrOXYzine (ATARAX/VISTARIL) 25 MG tablet, Take 1 tablet (25 mg total) by mouth at bedtime., Disp: 30 tablet, Rfl: 0   ipratropium-albuterol (DUONEB) 0.5-2.5 (3) MG/3ML SOLN, Take 3 mLs by nebulization every 4 (four) hours as needed., Disp: 360 mL, Rfl: 0   levocetirizine (XYZAL) 5 MG tablet, Take 5 mg by mouth every evening., Disp: , Rfl:    metoprolol tartrate (LOPRESSOR) 25 MG tablet, Take 0.5 tablets (12.5 mg total) by mouth 2 (two) times daily., Disp: 30 tablet, Rfl: 3   montelukast (SINGULAIR) 10 MG tablet, TAKE (1) TABLET BY MOUTH AT BEDTIME. (Patient taking differently: Take 10 mg by mouth at bedtime. ), Disp: 30 tablet, Rfl: 1   pantoprazole (PROTONIX) 40 MG tablet, TAKE 1 TABLET BY MOUTH 30 TO 60 MINUTES PRIOR TO THE FIRST MEAL OF THE DAY. (Patient taking differently: Take 40 mg by mouth daily. ), Disp: 30 tablet, Rfl: 0   potassium chloride (KLOR-CON) 20 MEQ tablet, Take 1 tablet (20 mEq total) by mouth 2 (two) times daily., Disp: 60 tablet, Rfl: 1   predniSONE (DELTASONE) 20 MG tablet, Take 2 tablets (40 mg total) by mouth daily with breakfast., Disp: 10 tablet, Rfl: 0   Tamsulosin HCl (FLOMAX) 0.4 MG CAPS, Take 0.4 mg by mouth daily after supper. , Disp: , Rfl:    torsemide (  DEMADEX) 20 MG tablet, Take 2 tablets (40 mg total) by mouth daily., Disp: 60 tablet, Rfl: 2   vitamin B-12 (CYANOCOBALAMIN) 500 MCG tablet, Take 1,000 mcg by mouth in the morning and at bedtime. , Disp: , Rfl:   Past Medical History: Past Medical History:  Diagnosis Date   AAA (abdominal aortic aneurysm) ([place])    Anxiety    due to pain in leg   Arrhythmia    Arthritis    Osteo   Bell's palsy    Cancer ([place])    Bladder cancer   Cataracts, bilateral    CHF (congestive heart failure) ([facility])    COPD (chronic obstructive pulmonary disease) ([facility])    Albuterol inhaler as needed    Diverticulitis    Enlarged prostate    takes Flomax daily   GERD (gastroesophageal reflux disease)    was on Prilosec but has been off for yrs-no issues now with reflux   Headache    migraines years ago   History of bronchitis    > 50yrs ago   History of colon polyps    benign   History of dizziness    several yrs ago-was on Meclizine but has been off for several yrs-no problems since   History of hiatal hernia    Hypertension    takes Amlodipine and Lisinpril daily   Joint pain    Joint swelling    Nocturia    PAD (peripheral artery disease) ([facility])     bilateral superficial femoral artery occlusions    Paroxysmal atrial fibrillation ([facility])    Pneumonia    several times with most recent within 5 yrs ago   Restless leg syndrome    Rupture of left quadriceps tendon    Shortness of breath dyspnea    with exertion   Sleep apnea    does not use cpap   Urinary urgency     Tobacco Use: Social History   Tobacco Use  Smoking Status Current Some Day Smoker   Packs/day: 0.25   Years: 45.00   Pack years: 11.25   Types: Cigars   Last attempt to quit: 01/07/2019   Years since quitting: 1.1  Smokeless Tobacco Never Used  Tobacco Comment   smokes 1/3 of a cigar a day- 12/27/2019    Labs: Recent Review Flowsheet Data    Labs for ITP Cardiac and Pulmonary Rehab Latest Ref Rng & Units 06/29/2008 08/02/2015 09/19/2015 03/11/2019   Cholestrol 0 - 200 mg/dL 153 .[name]. - - -   LDLCALC 0 - 99 mg/dL 99 ... - - -   HDL >39 mg/dL 29(L) - - -   Trlycerides <150 mg/dL 123 - - 163(H)   HCO3 20.0 - 24.0 mEq/L - - 26.8(H) -   TCO2 0 - 100 mmol/L - 24 28 -   O2SAT % - - 99.0 -      Capillary Blood Glucose: No results found for: GLUCAP   Pulmonary Assessment Scores:  Pulmonary Assessment Scores    Row Name 01/27/20 1418         ADL UCSD   ADL Phase Entry     SOB Score total 58           CAT Score   CAT Score 25           mMRC Score   mMRC Score 1            UCSD: Self-administered rating of dyspnea associated with activities of daily living (ADLs) 6-point  scale (0 = "not at all" to 5 = "maximal or unable to do because of breathlessness")  Scoring Scores range from 0 to 120.  Minimally important difference is 5 units  CAT: CAT can identify the health impairment of COPD patients and is better correlated with disease progression.  CAT has a scoring range of zero to 40. The CAT score is classified into four groups of low (less than 10), medium (10 - 20), high (21-30) and very high (31-40) based on the impact level of disease on health status. A CAT score over 10 suggests significant symptoms.  A worsening CAT score could be explained by an exacerbation, poor medication adherence, poor inhaler technique, or progression of COPD or comorbid conditions.  CAT MCID is 2 points  mMRC: mMRC (Modified Medical Research Council) Dyspnea Scale is used to assess the degree of baseline functional disability in patients of respiratory disease due to dyspnea. No minimal important difference is established. A decrease in score of 1 point or greater is considered a positive change.   Pulmonary Function Assessment:  Pulmonary Function Assessment - 01/27/20 1255      Breath   Shortness of Breath Yes;Limiting activity           Exercise Target Goals: Exercise Program Goal: Individual exercise prescription set using results from initial 6 min walk test and THRR while considering  patients activity barriers and safety.   Exercise Prescription Goal: Initial exercise prescription builds to 30-45 minutes a day of aerobic activity, 2-3 days per week.  Home exercise guidelines will be given to patient during program as part of exercise prescription that the participant will acknowledge.  Activity Barriers & Risk Stratification:  Activity Barriers & Cardiac Risk Stratification - 01/27/20 1251      Activity Barriers & Cardiac Risk Stratification   Activity  Barriers Arthritis;Back Problems;Joint Problems;Deconditioning;Muscular Weakness;Shortness of Breath;Assistive Device    Cardiac Risk Stratification High           6 Minute Walk:  6 Minute Walk    Row Name 01/27/20 1449         6 Minute Walk   Phase Initial     Distance 600 feet     Walk Time 6 minutes     # of Rest Breaks 1     MPH 1.14     METS 0.9     RPE 13     Perceived Dyspnea  15     VO2 Peak 3.16     Symptoms Yes (comment)     Comments 1 seated rest break for 2 min due to cramps in his quads. He rated the cramps a 7/10 for pain     Resting HR 57 bpm     Resting BP 108/80     Resting Oxygen Saturation  96 %     Exercise Oxygen Saturation  during 6 min walk 94 %     Max Ex. HR 89 bpm     Max Ex. BP 128/76     2 Minute Post BP 110/78           Interval HR   1 Minute HR 83     2 Minute HR 86     3 Minute HR 89     4 Minute HR 74     5 Minute HR 74     6 Minute HR 83     2 Minute Post HR 66     Interval Heart Rate? Yes  Interval Oxygen   Interval Oxygen? Yes     Baseline Oxygen Saturation % 96 %     1 Minute Oxygen Saturation % 94 %     1 Minute Liters of Oxygen 2 L     2 Minute Oxygen Saturation % 94 %     2 Minute Liters of Oxygen 2 L     3 Minute Oxygen Saturation % 95 %     3 Minute Liters of Oxygen 2 L     4 Minute Oxygen Saturation % 97 %     4 Minute Liters of Oxygen 2 L     5 Minute Oxygen Saturation % 98 %     5 Minute Liters of Oxygen 2 L     6 Minute Oxygen Saturation % 96 %     6 Minute Liters of Oxygen 2 L     2 Minute Post Oxygen Saturation % 97 %     2 Minute Post Liters of Oxygen 2 L            Oxygen Initial Assessment:  Oxygen Initial Assessment - 01/27/20 1418      Initial 6 min Walk   Oxygen Used Continuous    Liters per minute 2      Program Oxygen Prescription   Program Oxygen Prescription Continuous    Liters per minute 2      Intervention   Short Term Goals To learn and exhibit compliance with exercise,  home and travel O2 prescription;To learn and understand importance of monitoring SPO2 with pulse oximeter and demonstrate accurate use of the pulse oximeter.;To learn and demonstrate proper pursed lip breathing techniques or other breathing techniques.;To learn and demonstrate proper use of respiratory medications;To learn and understand importance of maintaining oxygen saturations>88%    Long  Term Goals Exhibits compliance with exercise, home and travel O2 prescription;Verbalizes importance of monitoring SPO2 with pulse oximeter and return demonstration;Maintenance of O2 saturations>88%;Exhibits proper breathing techniques, such as pursed lip breathing or other method taught during program session;Compliance with respiratory medication;Demonstrates proper use of MDIs           Oxygen Re-Evaluation:  Oxygen Re-Evaluation    Row Name 02/07/20 1534 03/06/20 1520           Program Oxygen Prescription   Program Oxygen Prescription Continuous Continuous      Liters per minute 2 2             Home Oxygen   Home Exercise Oxygen Prescription Continuous --      Liters per minute 2 --      Home Resting Oxygen Prescription Continuous --      Liters per minute 2 --      Compliance with Home Oxygen Use Yes --             Goals/Expected Outcomes   Short Term Goals To learn and exhibit compliance with exercise, home and travel O2 prescription;To learn and understand importance of monitoring SPO2 with pulse oximeter and demonstrate accurate use of the pulse oximeter.;To learn and understand importance of maintaining oxygen saturations>88%;To learn and demonstrate proper pursed lip breathing techniques or other breathing techniques.;To learn and demonstrate proper use of respiratory medications To learn and exhibit compliance with exercise, home and travel O2 prescription;To learn and understand importance of monitoring SPO2 with pulse oximeter and demonstrate accurate use of the pulse oximeter.;To  learn and understand importance of maintaining oxygen saturations>88%;To learn and demonstrate proper pursed lip breathing techniques  or other breathing techniques.;To learn and demonstrate proper use of respiratory medications      Long  Term Goals Demonstrates proper use of MDIs;Compliance with respiratory medication;Exhibits proper breathing techniques, such as pursed lip breathing or other method taught during program session;Maintenance of O2 saturations>88%;Verbalizes importance of monitoring SPO2 with pulse oximeter and return demonstration;Exhibits compliance with exercise, home and travel O2 prescription Exhibits compliance with exercise, home and travel O2 prescription;Verbalizes importance of monitoring SPO2 with pulse oximeter and return demonstration;Maintenance of O2 saturations>88%;Exhibits proper breathing techniques, such as pursed lip breathing or other method taught during program session;Compliance with respiratory medication;Demonstrates proper use of MDIs      Goals/Expected Outcomes Compliance Compliance             Oxygen Discharge (Final Oxygen Re-Evaluation):  Oxygen Re-Evaluation - 03/06/20 1520      Program Oxygen Prescription   Program Oxygen Prescription Continuous    Liters per minute 2      Goals/Expected Outcomes   Short Term Goals To learn and exhibit compliance with exercise, home and travel O2 prescription;To learn and understand importance of monitoring SPO2 with pulse oximeter and demonstrate accurate use of the pulse oximeter.;To learn and understand importance of maintaining oxygen saturations>88%;To learn and demonstrate proper pursed lip breathing techniques or other breathing techniques.;To learn and demonstrate proper use of respiratory medications    Long  Term Goals Exhibits compliance with exercise, home and travel O2 prescription;Verbalizes importance of monitoring SPO2 with pulse oximeter and return demonstration;Maintenance of O2  saturations>88%;Exhibits proper breathing techniques, such as pursed lip breathing or other method taught during program session;Compliance with respiratory medication;Demonstrates proper use of MDIs    Goals/Expected Outcomes Compliance           Initial Exercise Prescription:  Initial Exercise Prescription - 01/27/20 1400      Date of Initial Exercise RX and Referring Provider   Date 01/27/20    Referring Provider Dr. Shearon Stalls     Expected Discharge Date 05/31/20      Oxygen   Oxygen Continuous    Liters 2      NuStep   Level 1    SPM 60    Minutes 17      Arm Ergometer   Level 1    RPM 60    Minutes 22      Prescription Details   Frequency (times per week) 2    Duration Progress to 30 minutes of continuous aerobic without signs/symptoms of physical distress      Intensity   THRR 40-80% of Max Heartrate 56-112    Ratings of Perceived Exertion 11-13    Perceived Dyspnea 0-4      Resistance Training   Training Prescription Yes    Weight 2 lbs    Reps 10-15           Perform Capillary Blood Glucose checks as needed.  Exercise Prescription Changes:   Exercise Prescription Changes    Row Name 01/31/20 1600 02/07/20 1400 02/21/20 1500 03/06/20 1500       Response to Exercise   Blood Pressure (Admit) 128/78 140/72 110/68 140/82    Blood Pressure (Exercise) 148/80 148/68 128/70 152/84    Blood Pressure (Exit) 136/72 122/76 122/60 122/78    Heart Rate (Admit) 62 bpm 71 bpm 76 bpm 75 bpm    Heart Rate (Exercise) 75 bpm 83 bpm 80 bpm 81 bpm    Heart Rate (Exit) 71 bpm 86 bpm 84 bpm 81 bpm  Oxygen Saturation (Admit) 97 % 96 % 97 % 95 %    Oxygen Saturation (Exercise) 94 % 95 % 96 % 95 %    Oxygen Saturation (Exit) 97 % 95 % 96 % 97 %    Rating of Perceived Exertion (Exercise) 15 15 13 12     Perceived Dyspnea (Exercise) 15 15 13 12     Duration Continue with 30 min of aerobic exercise without signs/symptoms of physical distress. Continue with 30 min of aerobic  exercise without signs/symptoms of physical distress. Continue with 30 min of aerobic exercise without signs/symptoms of physical distress. Continue with 30 min of aerobic exercise without signs/symptoms of physical distress.    Intensity THRR unchanged THRR unchanged THRR unchanged THRR unchanged         Progression   Progression Continue to progress workloads to maintain intensity without signs/symptoms of physical distress. Continue to progress workloads to maintain intensity without signs/symptoms of physical distress. Continue to progress workloads to maintain intensity without signs/symptoms of physical distress. Continue to progress workloads to maintain intensity without signs/symptoms of physical distress.         Resistance Training   Training Prescription Yes Yes Yes Yes    Weight 2 lbs 2 lbs 2 lbs 2 lbs    Reps 10-15 10-15 10-15 10-15    Time 10 Minutes 10 Minutes 10 Minutes 10 Minutes         Oxygen   Oxygen Continuous Continuous Continuous Continuous    Liters 2 2 2 2          NuStep   Level 1 1 1 1     SPM 78 74 70 86    Minutes 17 17 17 17     METs 1.7 1.4 1.4 2.1         Arm Ergometer   Level 1 1 1 1     RPM 49 41 45 49    Minutes 22 22 22 22     METs 1.5 1.3 1.5 1.5           Exercise Comments:   Exercise Comments    Row Name 01/31/20 1604           Exercise Comments Pt completed his first exercise session in pulmonary rehab today. He is severely deconditioned. He complained of shoulder pain during the warm up and was unable to do a portion of the hand weight activities. He complained of cramps in his quads while on the stepper. He was otherwise able to tolerate the exercise session.              Exercise Goals and Review:   Exercise Goals    Row Name 01/27/20 1452 02/07/20 1535 03/06/20 1514         Exercise Goals   Increase Physical Activity Yes Yes Yes     Intervention Provide advice, education, support and counseling about physical  activity/exercise needs.;Develop an individualized exercise prescription for aerobic and resistive training based on initial evaluation findings, risk stratification, comorbidities and participant's personal goals. Provide advice, education, support and counseling about physical activity/exercise needs.;Develop an individualized exercise prescription for aerobic and resistive training based on initial evaluation findings, risk stratification, comorbidities and participant's personal goals. Provide advice, education, support and counseling about physical activity/exercise needs.;Develop an individualized exercise prescription for aerobic and resistive training based on initial evaluation findings, risk stratification, comorbidities and participant's personal goals.     Expected Outcomes Short Term: Attend rehab on a regular basis to increase amount of physical activity.;Long  Term: Add in home exercise to make exercise part of routine and to increase amount of physical activity.;Long Term: Exercising regularly at least 3-5 days a week. Short Term: Attend rehab on a regular basis to increase amount of physical activity.;Long Term: Add in home exercise to make exercise part of routine and to increase amount of physical activity.;Long Term: Exercising regularly at least 3-5 days a week. Short Term: Attend rehab on a regular basis to increase amount of physical activity.;Long Term: Add in home exercise to make exercise part of routine and to increase amount of physical activity.;Long Term: Exercising regularly at least 3-5 days a week.     Increase Strength and Stamina Yes Yes Yes     Intervention Provide advice, education, support and counseling about physical activity/exercise needs.;Develop an individualized exercise prescription for aerobic and resistive training based on initial evaluation findings, risk stratification, comorbidities and participant's personal goals. Provide advice, education, support and  counseling about physical activity/exercise needs.;Develop an individualized exercise prescription for aerobic and resistive training based on initial evaluation findings, risk stratification, comorbidities and participant's personal goals. Provide advice, education, support and counseling about physical activity/exercise needs.;Develop an individualized exercise prescription for aerobic and resistive training based on initial evaluation findings, risk stratification, comorbidities and participant's personal goals.     Expected Outcomes Short Term: Increase workloads from initial exercise prescription for resistance, speed, and METs.;Short Term: Perform resistance training exercises routinely during rehab and add in resistance training at home;Long Term: Improve cardiorespiratory fitness, muscular endurance and strength as measured by increased METs and functional capacity (6MWT) Short Term: Increase workloads from initial exercise prescription for resistance, speed, and METs.;Short Term: Perform resistance training exercises routinely during rehab and add in resistance training at home;Long Term: Improve cardiorespiratory fitness, muscular endurance and strength as measured by increased METs and functional capacity (6MWT) Short Term: Increase workloads from initial exercise prescription for resistance, speed, and METs.;Short Term: Perform resistance training exercises routinely during rehab and add in resistance training at home;Long Term: Improve cardiorespiratory fitness, muscular endurance and strength as measured by increased METs and functional capacity (6MWT)     Able to understand and use rate of perceived exertion (RPE) scale Yes Yes Yes     Intervention Provide education and explanation on how to use RPE scale Provide education and explanation on how to use RPE scale Provide education and explanation on how to use RPE scale     Expected Outcomes Short Term: Able to use RPE daily in rehab to express  subjective intensity level;Long Term:  Able to use RPE to guide intensity level when exercising independently Short Term: Able to use RPE daily in rehab to express subjective intensity level;Long Term:  Able to use RPE to guide intensity level when exercising independently Short Term: Able to use RPE daily in rehab to express subjective intensity level;Long Term:  Able to use RPE to guide intensity level when exercising independently     Able to understand and use Dyspnea scale Yes Yes Yes     Intervention Provide education and explanation on how to use Dyspnea scale Provide education and explanation on how to use Dyspnea scale Provide education and explanation on how to use Dyspnea scale     Expected Outcomes Short Term: Able to use Dyspnea scale daily in rehab to express subjective sense of shortness of breath during exertion;Long Term: Able to use Dyspnea scale to guide intensity level when exercising independently Short Term: Able to use Dyspnea scale daily in rehab to  express subjective sense of shortness of breath during exertion;Long Term: Able to use Dyspnea scale to guide intensity level when exercising independently Long Term: Able to use Dyspnea scale to guide intensity level when exercising independently;Short Term: Able to use Dyspnea scale daily in rehab to express subjective sense of shortness of breath during exertion     Knowledge and understanding of Target Heart Rate Range (THRR) Yes Yes Yes     Intervention Provide education and explanation of THRR including how the numbers were predicted and where they are located for reference Provide education and explanation of THRR including how the numbers were predicted and where they are located for reference Provide education and explanation of THRR including how the numbers were predicted and where they are located for reference     Expected Outcomes Short Term: Able to state/look up THRR;Long Term: Able to use THRR to govern intensity when  exercising independently;Short Term: Able to use daily as guideline for intensity in rehab Short Term: Able to state/look up THRR;Long Term: Able to use THRR to govern intensity when exercising independently;Short Term: Able to use daily as guideline for intensity in rehab Short Term: Able to state/look up THRR;Long Term: Able to use THRR to govern intensity when exercising independently;Short Term: Able to use daily as guideline for intensity in rehab     Understanding of Exercise Prescription Yes Yes Yes     Intervention Provide education, explanation, and written materials on patient's individual exercise prescription Provide education, explanation, and written materials on patient's individual exercise prescription Provide education, explanation, and written materials on patient's individual exercise prescription     Expected Outcomes Short Term: Able to explain program exercise prescription;Long Term: Able to explain home exercise prescription to exercise independently Short Term: Able to explain program exercise prescription;Long Term: Able to explain home exercise prescription to exercise independently Short Term: Able to explain program exercise prescription;Long Term: Able to explain home exercise prescription to exercise independently            Exercise Goals Re-Evaluation :  Exercise Goals Re-Evaluation    Row Name 02/07/20 1535 03/06/20 1514           Exercise Goal Re-Evaluation   Exercise Goals Review Increase Physical Activity;Increase Strength and Stamina;Able to understand and use rate of perceived exertion (RPE) scale;Able to understand and use Dyspnea scale;Knowledge and understanding of Target Heart Rate Range (THRR);Understanding of Exercise Prescription Increase Physical Activity;Increase Strength and Stamina;Able to understand and use rate of perceived exertion (RPE) scale;Able to understand and use Dyspnea scale;Knowledge and understanding of Target Heart Rate Range  (THRR);Understanding of Exercise Prescription      Comments Pt has completed 3 exercise sessions. Pt has been complaining of pain in his left quad. He was wheezing today and did not bring his rescue inhaler. We encouraged him to bring his rescue inhaler to rehab. He also complains of left shoulder pain. He is exercising at 1.4 METs on the NuStep. Will continue to monitor and progress exercise as able. Patient has completed 9 exercise sessions. He has tolerated exercise well. He still complains of pain in his legs. He is progressing exercise well also. He is improving on his spm and he is determined when he comes into rehab to get as far as he can, distance wise, on the machines. He is currently exercising at 2.1 METs on the NuStep. Will continue to monitor and progress exercise as able.      Expected Outcomes Through exercising at rehab and  at home patient will reach their goals. Through exercise at rehab and with a home exercise program, patient will achieve their goals.             Discharge Exercise Prescription (Final Exercise Prescription Changes):  Exercise Prescription Changes - 03/06/20 1500      Response to Exercise   Blood Pressure (Admit) 140/82    Blood Pressure (Exercise) 152/84    Blood Pressure (Exit) 122/78    Heart Rate (Admit) 75 bpm    Heart Rate (Exercise) 81 bpm    Heart Rate (Exit) 81 bpm    Oxygen Saturation (Admit) 95 %    Oxygen Saturation (Exercise) 95 %    Oxygen Saturation (Exit) 97 %    Rating of Perceived Exertion (Exercise) 12    Perceived Dyspnea (Exercise) 12    Duration Continue with 30 min of aerobic exercise without signs/symptoms of physical distress.    Intensity THRR unchanged      Progression   Progression Continue to progress workloads to maintain intensity without signs/symptoms of physical distress.      Resistance Training   Training Prescription Yes    Weight 2 lbs    Reps 10-15    Time 10 Minutes      Oxygen   Oxygen Continuous     Liters 2      NuStep   Level 1    SPM 86    Minutes 17    METs 2.1      Arm Ergometer   Level 1    RPM 49    Minutes 22    METs 1.5           Nutrition:  Target Goals: Understanding of nutrition guidelines, daily intake of sodium 1500mg , cholesterol 200mg , calories 30% from fat and 7% or less from saturated fats, daily to have 5 or more servings of fruits and vegetables.  Biometrics:  Pre Biometrics - 03/06/20 1519      Pre Biometrics   Weight 100.1 kg            Nutrition Therapy Plan and Nutrition Goals:  Nutrition Therapy & Goals - 03/05/20 0857      Personal Nutrition Goals   Comments Patient scored 21 on his medficts diet assessment. We will continue to provide heart healthy nutritional education through hand-outs.      Intervention Plan   Intervention Nutrition handout(s) given to patient.           Nutrition Assessments:  Nutrition Assessments - 01/27/20 1418      MEDFICTS Scores   Pre Score 21          MEDIFICTS Score Key:  ?70 Need to make dietary changes   40-70 Heart Healthy Diet  ? 40 Therapeutic Level Cholesterol Diet   Picture Your Plate Scores:  <11 Unhealthy dietary pattern with much room for improvement.  41-50 Dietary pattern unlikely to meet recommendations for good health and room for improvement.  51-60 More healthful dietary pattern, with some room for improvement.   >60 Healthy dietary pattern, although there may be some specific behaviors that could be improved.    Nutrition Goals Re-Evaluation:   Nutrition Goals Discharge (Final Nutrition Goals Re-Evaluation):   Psychosocial: Target Goals: Acknowledge presence or absence of significant depression and/or stress, maximize coping skills, provide positive support system. Participant is able to verbalize types and ability to use techniques and skills needed for reducing stress and depression.  Initial Review & Psychosocial Screening:  Initial  Psych Review &  Screening - 01/27/20 1256      Initial Review   Current issues with None Identified      Family Dynamics   Good Support System? Yes    Comments His wife is his support system. His dog just passed away who was also part of his support system. His daughter also supports him when she can. She is busy with her two children.      Barriers   Psychosocial barriers to participate in program There are no identifiable barriers or psychosocial needs.      Screening Interventions   Interventions Encouraged to exercise;Other (comment)           Quality of Life Scores:  Quality of Life - 01/27/20 1449      Quality of Life   Select Quality of Life      Quality of Life Scores   Health/Function Pre 20.43 %    Socioeconomic Pre 25.75 %    Psych/Spiritual Pre 26.36 %    Family Pre 27.6 %    GLOBAL Pre 23.74 %          Scores of 19 and below usually indicate a poorer quality of life in these areas.  A difference of  2-3 points is a clinically meaningful difference.  A difference of 2-3 points in the total score of the Quality of Life Index has been associated with significant improvement in overall quality of life, self-image, physical symptoms, and general health in studies assessing change in quality of life.   PHQ-9: Recent Review Flowsheet Data    Depression screen [facility] 2/9 01/27/2020 05/07/2017   Decreased Interest 0 0   Down, Depressed, Hopeless 0 0   PHQ - 2 Score 0 0   Altered sleeping 0 -   Tired, decreased energy 1 -   Change in appetite 0 -   Feeling bad or failure about yourself  0 -   Trouble concentrating 0 -   Moving slowly or fidgety/restless 0 -   Suicidal thoughts 0 -   PHQ-9 Score 1 -   Difficult doing work/chores Somewhat difficult -     Interpretation of Total Score  Total Score Depression Severity:  1-4 = Minimal depression, 5-9 = Mild depression, 10-14 = Moderate depression, 15-19 = Moderately severe depression, 20-27 = Severe depression   Psychosocial  Evaluation and Intervention:   Psychosocial Re-Evaluation:  Psychosocial Re-Evaluation    Row Name 02/06/20 1327 03/05/20 0858           Psychosocial Re-Evaluation   Current issues with None Identified None Identified      Comments Patient has no psychosocial issues identified. His initial QOL score was 23.74 and his PHQ-9 score was 1. He has a positive attitude and works hard duing his sessions. Will continue to monitor. Patient has no psychosocial issues identified. His initial QOL score was 23.74 and his PHQ-9 score was 1. He continues to demonstrate a positive outlook and works hard duing his sessions. Will continue to monitor.      Expected Outcomes Patient will have no psychosocial issues identified at discharge. Patient will have no psychosocial issues identified at discharge.      Interventions Encouraged to attend Pulmonary Rehabilitation for the exercise;Relaxation education;Stress management education Encouraged to attend Pulmonary Rehabilitation for the exercise;Relaxation education;Stress management education      Continue Psychosocial Services  No Follow up required No Follow up required  Psychosocial Discharge (Final Psychosocial Re-Evaluation):  Psychosocial Re-Evaluation - 03/05/20 0858      Psychosocial Re-Evaluation   Current issues with None Identified    Comments Patient has no psychosocial issues identified. His initial QOL score was 23.74 and his PHQ-9 score was 1. He continues to demonstrate a positive outlook and works hard duing his sessions. Will continue to monitor.    Expected Outcomes Patient will have no psychosocial issues identified at discharge.    Interventions Encouraged to attend Pulmonary Rehabilitation for the exercise;Relaxation education;Stress management education    Continue Psychosocial Services  No Follow up required            Education: Education Goals: Education classes will be provided on a weekly basis, covering  required topics. Participant will state understanding/return demonstration of topics presented.  Learning Barriers/Preferences:   Education Topics: How Lungs Work and Diseases: - Discuss the anatomy of the lungs and diseases that can affect the lungs, such as COPD.   Exercise: -Discuss the importance of exercise, FITT principles of exercise, normal and abnormal responses to exercise, and how to exercise safely.   Environmental Irritants: -Discuss types of environmental irritants and how to limit exposure to environmental irritants.   Meds/Inhalers and oxygen: - Discuss respiratory medications, definition of an inhaler and oxygen, and the proper way to use an inhaler and oxygen.   Energy Saving Techniques: - Discuss methods to conserve energy and decrease shortness of breath when performing activities of daily living.    Bronchial Hygiene / Breathing Techniques: - Discuss breathing mechanics, pursed-lip breathing technique,  proper posture, effective ways to clear airways, and other functional breathing techniques   Cleaning Equipment: - Provides group verbal and written instruction about the health risks of elevated stress, cause of high stress, and healthy ways to reduce stress. Flowsheet Row PULMONARY REHAB CHRONIC OBSTRUCTIVE PULMONARY DISEASE from 03/01/2020 in [facility]  Date 02/02/20  Educator DJ  Instruction Review Code 1- Verbalizes Understanding      Nutrition I: Fats: - Discuss the types of cholesterol, what cholesterol does to the body, and how cholesterol levels can be controlled. Flowsheet Row PULMONARY REHAB CHRONIC OBSTRUCTIVE PULMONARY DISEASE from 03/01/2020 in [place]  Date 02/09/20  Educator Etheleen Mayhew  Instruction Review Code 1- Verbalizes Understanding      Nutrition II: Labels: -Discuss the different components of food labels and how to read food labels.   Respiratory Infections: - Discuss the  signs and symptoms of respiratory infections, ways to prevent respiratory infections, and the importance of seeking medical treatment when having a respiratory infection. Flowsheet Row PULMONARY REHAB CHRONIC OBSTRUCTIVE PULMONARY DISEASE from 03/01/2020 in [place]  Date 02/23/20  Educator DF  Instruction Review Code 1- Verbalizes Understanding      Stress I: Signs and Symptoms: - Discuss the causes of stress, how stress may lead to anxiety and depression, and ways to limit stress. Flowsheet Row PULMONARY REHAB CHRONIC OBSTRUCTIVE PULMONARY DISEASE from 03/01/2020 in [place]  Date 03/01/20  Educator [facility]  Instruction Review Code 1- Verbalizes Understanding      Stress II: Relaxation: -Discuss relaxation techniques to limit stress.   Oxygen for Home/Travel: - Discuss how to prepare for travel when on oxygen and proper ways to transport and store oxygen to ensure safety.   Knowledge Questionnaire Score:  Knowledge Questionnaire Score - 01/27/20 1419      Knowledge Questionnaire Score   Pre Score 16/18  Core Components/Risk Factors/Patient Goals at Admission:  Personal Goals and Risk Factors at Admission - 01/27/20 1419      Core Components/Risk Factors/Patient Goals on Admission   Improve shortness of breath with ADL's Yes    Intervention Provide education, individualized exercise plan and daily activity instruction to help decrease symptoms of SOB with activities of daily living.    Expected Outcomes Short Term: Improve cardiorespiratory fitness to achieve a reduction of symptoms when performing ADLs;Long Term: Be able to perform more ADLs without symptoms or delay the onset of symptoms    Heart Failure Yes    Intervention Provide a combined exercise and nutrition program that is supplemented with education, support and counseling about heart failure. Directed toward relieving symptoms such as shortness of breath,  decreased exercise tolerance, and extremity edema.    Expected Outcomes Improve functional capacity of life;Short term: Attendance in program 2-3 days a week with increased exercise capacity. Reported lower sodium intake. Reported increased fruit and vegetable intake. Reports medication compliance.;Short term: Daily weights obtained and reported for increase. Utilizing diuretic protocols set by physician.;Long term: Adoption of self-care skills and reduction of barriers for early signs and symptoms recognition and intervention leading to self-care maintenance.           Core Components/Risk Factors/Patient Goals Review:   Goals and Risk Factor Review    Row Name 02/06/20 1328 03/05/20 0858           Core Components/Risk Factors/Patient Goals Review   Review Patient is new to the program completing 2 sessions. 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 His personal goals are to return to his ADL's and returnto walking long distances. Will continue to monitor for progress. Patient has completed 8 sessions gaining 2 lbs since last 30 day review. He was referred to pulmonary rehab with COPD with chronic bronchitis and emphysema. He is doing the best he can in the sessions. He continues to exhibit labored breathing with severe stridor. He continues to smoke cigars occasionally. His personal goals are to return to his ADL's and retrun to walking long distance. Will continue to monitor for progress.      Expected Outcomes Patient will complete the program meeting both program and personal goals. Patient will complete the program meeting both program and personal goals.             Core Components/Risk Factors/Patient Goals at Discharge (Final Review):   Goals and Risk Factor Review - 03/05/20 0858      Core Components/Risk Factors/Patient Goals Review   Review Patient has completed 8 sessions gaining 2 lbs since last 30 day review. He was referred to pulmonary rehab with COPD with  chronic bronchitis and emphysema. He is doing the best he can in the sessions. He continues to exhibit labored breathing with severe stridor. He continues to smoke cigars occasionally. His personal goals are to return to his ADL's and retrun to walking long distance. Will continue to monitor for progress.    Expected Outcomes Patient will complete the program meeting both program and personal goals.           ITP Comments:   Comments: ITP REVIEW Pt is making expected progress toward pulmonary rehab goals after completing 8 sessions. Recommend continued exercise, life style modification, education, and utilization of breathing techniques to increase stamina and strength and decrease shortness of breath with exertion.

## 2020-03-13 ENCOUNTER — Encounter (HOSPITAL_COMMUNITY): Payer: PPO

## 2020-03-15 ENCOUNTER — Encounter (HOSPITAL_COMMUNITY): Payer: PPO

## 2020-03-20 ENCOUNTER — Encounter (HOSPITAL_COMMUNITY): Payer: PPO

## 2020-03-22 ENCOUNTER — Encounter (HOSPITAL_COMMUNITY)
Admission: RE | Admit: 2020-03-22 | Discharge: 2020-03-22 | Disposition: A | Discharge status: Home / Self Care | Payer: PPO | Source: Ambulatory Visit | Attending: Internal Medicine | Admitting: Internal Medicine

## 2020-03-22 ENCOUNTER — Other Ambulatory Visit: Payer: Self-pay

## 2020-03-22 DIAGNOSIS — J449 Chronic obstructive pulmonary disease, unspecified: Secondary | ICD-10-CM | POA: Diagnosis not present

## 2020-03-22 NOTE — Progress Notes (Signed)
Daily Session Note  Patient Details  Name: Derrick Moon MRN: [id number] Date of Birth: Jun 07, 1940 Referring Provider:   Flowsheet Row PULMONARY REHAB COPD ORIENTATION from 01/27/2020 in [place]  Referring Provider Dr. Shearon Stalls       Encounter Date: 03/22/2020  Check In:  Session Check In - 03/22/20 1328      Check-In   Supervising physician immediately available to respond to emergencies [facility] MD immediately available    Physician(s) Harrington Challenger    Location [facility]    Staff Present Geanie Cooley, RN;Dalton Kris Mouton, MS, ACSM-CEP, Exercise Physiologist    Virtual Visit No    Medication changes reported     No    Fall or balance concerns reported    No    Tobacco Cessation No Change    Warm-up and Cool-down Performed as group-led instruction    Resistance Training Performed No    VAD Patient? No    PAD/SET Patient? No      Pain Assessment   Currently in Pain? No/denies    Pain Score 0-No pain    Multiple Pain Sites No           Capillary Blood Glucose: No results found for this or any previous visit (from the past 24 hour(s)).    Social History   Tobacco Use  Smoking Status Current Some Day Smoker  . Packs/day: 0.25  . Years: 45.00  . Pack years: 11.25  . Types: Cigars  . Last attempt to quit: 01/07/2019  . Years since quitting: 1.2  Smokeless Tobacco Never Used  Tobacco Comment   smokes 1/3 of a cigar a day- 12/27/2019    Goals Met:  Proper associated with RPD/PD & O2 Sat Independence with exercise equipment Improved SOB with ADL's Exercise tolerated well No report of cardiac concerns or symptoms Strength training completed today  Goals Unmet:  Not Applicable  Comments: check out _0 :30   Dr. Kathie Dike is Medical Director for [facility] Pulmonary Rehab.

## 2020-03-27 ENCOUNTER — Other Ambulatory Visit: Payer: Self-pay

## 2020-03-27 ENCOUNTER — Encounter (HOSPITAL_COMMUNITY)
Admission: RE | Admit: 2020-03-27 | Discharge: 2020-03-27 | Disposition: A | Discharge status: Home / Self Care | Payer: PPO | Source: Ambulatory Visit | Attending: Internal Medicine | Admitting: Internal Medicine

## 2020-03-27 DIAGNOSIS — J449 Chronic obstructive pulmonary disease, unspecified: Secondary | ICD-10-CM | POA: Insufficient documentation

## 2020-03-27 NOTE — Progress Notes (Signed)
Daily Session Note  Patient Details  Name: Derrick Moon MRN: [id number] Date of Birth: 10/03/1940 Referring Provider:   Flowsheet Row PULMONARY REHAB COPD ORIENTATION from 01/27/2020 in [place]  Referring Provider Dr. Shearon Stalls       Encounter Date: 03/27/2020  Check In:  Session Check In - 03/27/20 1330      Check-In   Supervising physician immediately available to respond to emergencies [facility] MD immediately available    Physician(s) Dr. Domenic Polite    Location [facility]    Staff Present Geanie Cooley, RN;Madison Audria Nine, MS, Exercise Physiologist;Sharyah Bostwick Kris Mouton, MS, ACSM-CEP, Exercise Physiologist    Virtual Visit No    Medication changes reported     No    Fall or balance concerns reported    No    Tobacco Cessation No Change    Warm-up and Cool-down Performed as group-led instruction    Resistance Training Performed Yes    VAD Patient? No    PAD/SET Patient? No      Pain Assessment   Currently in Pain? No/denies    Pain Score 0-No pain    Multiple Pain Sites No           Capillary Blood Glucose: No results found for this or any previous visit (from the past 24 hour(s)).    Social History   Tobacco Use  Smoking Status Current Some Day Smoker  . Packs/day: 0.25  . Years: 45.00  . Pack years: 11.25  . Types: Cigars  . Last attempt to quit: 01/07/2019  . Years since quitting: 1.2  Smokeless Tobacco Never Used  Tobacco Comment   smokes 1/3 of a cigar a day- 12/27/2019    Goals Met:  Independence with exercise equipment Exercise tolerated well No report of cardiac concerns or symptoms Strength training completed today  Goals Unmet:  Not Applicable  Comments: checkout time is 1430   Dr. Kathie Dike is Medical Director for [facility] Pulmonary Rehab.

## 2020-03-29 ENCOUNTER — Encounter (HOSPITAL_COMMUNITY): Payer: PPO

## 2020-04-03 ENCOUNTER — Other Ambulatory Visit: Payer: Self-pay

## 2020-04-03 ENCOUNTER — Encounter (HOSPITAL_COMMUNITY)
Admission: RE | Admit: 2020-04-03 | Discharge: 2020-04-03 | Disposition: A | Discharge status: Home / Self Care | Payer: PPO | Source: Ambulatory Visit | Attending: Internal Medicine | Admitting: Internal Medicine

## 2020-04-03 VITALS — Wt 225.5 lb

## 2020-04-03 DIAGNOSIS — J449 Chronic obstructive pulmonary disease, unspecified: Secondary | ICD-10-CM | POA: Diagnosis not present

## 2020-04-03 NOTE — Progress Notes (Signed)
Daily Session Note  Patient Details  Name: Derrick Moon MRN: [id number] Date of Birth: 10/18/1940 Referring Provider:   Flowsheet Row PULMONARY REHAB COPD ORIENTATION from 01/27/2020 in [place]  Referring Provider Dr. Shearon Stalls       Encounter Date: 04/03/2020  Check In:  Session Check In - 04/03/20 1330      Check-In   Supervising physician immediately available to respond to emergencies [facility] MD immediately available    Physician(s) Dr. Domenic Polite    Location [facility]    Staff Present Geanie Cooley, RN;Madison Audria Nine, MS, Exercise Physiologist;Brandi Armato Kris Mouton, MS, ACSM-CEP, Exercise Physiologist    Virtual Visit No    Medication changes reported     No    Fall or balance concerns reported    No    Tobacco Cessation No Change    Warm-up and Cool-down Performed as group-led instruction    Resistance Training Performed Yes    VAD Patient? No    PAD/SET Patient? No      Pain Assessment   Currently in Pain? No/denies    Pain Score 0-No pain           Capillary Blood Glucose: No results found for this or any previous visit (from the past 24 hour(s)).    Social History   Tobacco Use  Smoking Status Current Some Day Smoker  . Packs/day: 0.25  . Years: 45.00  . Pack years: 11.25  . Types: Cigars  . Last attempt to quit: 01/07/2019  . Years since quitting: 1.2  Smokeless Tobacco Never Used  Tobacco Comment   smokes 1/3 of a cigar a day- 12/27/2019    Goals Met:  Independence with exercise equipment Exercise tolerated well No report of cardiac concerns or symptoms Strength training completed today  Goals Unmet:  Not Applicable  Comments: checkout time is 1430   Dr. Kathie Dike is Medical Director for [facility] Pulmonary Rehab.

## 2020-04-05 ENCOUNTER — Encounter (HOSPITAL_COMMUNITY)
Admission: RE | Admit: 2020-04-05 | Discharge: 2020-04-05 | Disposition: A | Discharge status: Home / Self Care | Payer: PPO | Source: Ambulatory Visit | Attending: Internal Medicine | Admitting: Internal Medicine

## 2020-04-05 ENCOUNTER — Other Ambulatory Visit: Payer: Self-pay

## 2020-04-05 DIAGNOSIS — J449 Chronic obstructive pulmonary disease, unspecified: Secondary | ICD-10-CM

## 2020-04-05 NOTE — Progress Notes (Signed)
Pulmonary Individual Treatment Plan  Patient Details  Name: Derrick Moon MRN: [id number] Date of Birth: 03/02/1941 Referring Provider:   Flowsheet Row PULMONARY REHAB COPD ORIENTATION from 01/27/2020 in [facility]  Referring Provider Dr. Shearon Stalls       Initial Encounter Date:  Flowsheet Row PULMONARY REHAB COPD ORIENTATION from 01/27/2020 in [facility]  Date 01/27/20      Visit Diagnosis: COPD with chronic bronchitis and emphysema ([place])  Patient's Home Medications on Admission:   Current Outpatient Medications:  .  acetaminophen (TYLENOL) 500 MG tablet, Take 1,000 mg by mouth every 6 (six) hours as needed (for pain or headaches). , Disp: , Rfl:  .  albuterol (PROVENTIL HFA;VENTOLIN HFA) 108 (90 BASE) MCG/ACT inhaler, Inhale 1-2 puffs into the lungs every 6 (six) hours as needed for wheezing or shortness of breath. Reported on 10/03/2015, Disp: , Rfl:  .  amLODipine (NORVASC) 5 MG tablet, Take 5 mg by mouth daily., Disp: , Rfl:  .  apixaban (ELIQUIS) 5 MG TABS tablet, Take 1 tablet (5 mg total) 2 (two) times daily by mouth., Disp: 60 tablet, Rfl: 0 .  baclofen (LIORESAL) 10 MG tablet, Take 10 mg by mouth 2 (two) times daily., Disp: , Rfl:  .  budesonide-formoterol (SYMBICORT) 160-4.5 MCG/ACT inhaler, Inhale 2 puffs into the lungs 2 (two) times daily., Disp: 1 Inhaler, Rfl: 0 .  diphenhydrAMINE (BENADRYL) 25 MG tablet, Take 25 mg by mouth every 6 (six) hours as needed for itching or allergies. , Disp: , Rfl:  .  docusate sodium (COLACE) 100 MG capsule, Take 1 capsule (100 mg total) by mouth 2 (two) times daily. (Patient taking differently: Take 100 mg by mouth daily as needed. ), Disp: 10 capsule, Rfl: 0 .  famotidine (PEPCID) 20 MG tablet, TAKE (1) TABLET BY MOUTH AT BEDTIME. (Patient taking differently: Take 20 mg by mouth at bedtime. TAKE (1) TABLET BY MOUTH AT BEDTIME.), Disp: 30 tablet, Rfl: 0 .  guaiFENesin (MUCINEX) 600 MG 12 hr tablet,  Take 600 mg by mouth 2 (two) times daily as needed for cough or to loosen phlegm., Disp: , Rfl:  .  hydrOXYzine (ATARAX/VISTARIL) 25 MG tablet, Take 1 tablet (25 mg total) by mouth at bedtime., Disp: 30 tablet, Rfl: 0 .  ipratropium-albuterol (DUONEB) 0.5-2.5 (3) MG/3ML SOLN, Take 3 mLs by nebulization every 4 (four) hours as needed., Disp: 360 mL, Rfl: 0 .  levocetirizine (XYZAL) 5 MG tablet, Take 5 mg by mouth every evening., Disp: , Rfl:  .  metoprolol tartrate (LOPRESSOR) 25 MG tablet, Take 0.5 tablets (12.5 mg total) by mouth 2 (two) times daily., Disp: 30 tablet, Rfl: 3 .  montelukast (SINGULAIR) 10 MG tablet, TAKE (1) TABLET BY MOUTH AT BEDTIME. (Patient taking differently: Take 10 mg by mouth at bedtime. ), Disp: 30 tablet, Rfl: 1 .  pantoprazole (PROTONIX) 40 MG tablet, TAKE 1 TABLET BY MOUTH 30 TO 60 MINUTES PRIOR TO THE FIRST MEAL OF THE DAY. (Patient taking differently: Take 40 mg by mouth daily. ), Disp: 30 tablet, Rfl: 0 .  potassium chloride (KLOR-CON) 20 MEQ tablet, Take 1 tablet (20 mEq total) by mouth 2 (two) times daily., Disp: 60 tablet, Rfl: 1 .  predniSONE (DELTASONE) 20 MG tablet, Take 2 tablets (40 mg total) by mouth daily with breakfast., Disp: 10 tablet, Rfl: 0 .  Tamsulosin HCl (FLOMAX) 0.4 MG CAPS, Take 0.4 mg by mouth daily after supper. , Disp: , Rfl:  .  torsemide (  DEMADEX) 20 MG tablet, Take 2 tablets (40 mg total) by mouth daily., Disp: 60 tablet, Rfl: 2 .  vitamin B-12 (CYANOCOBALAMIN) 500 MCG tablet, Take 1,000 mcg by mouth in the morning and at bedtime. , Disp: , Rfl:   Past Medical History: Past Medical History:  Diagnosis Date  . AAA (abdominal aortic aneurysm) ([place])   . Anxiety    due to pain in leg  . Arrhythmia   . Arthritis    Osteo  . Bell's palsy   . Cancer [facility])    Bladder cancer  . Cataracts, bilateral   . CHF (congestive heart failure) ([place])   . COPD (chronic obstructive pulmonary disease) ([facility])    Albuterol inhaler as needed  .  Diverticulitis   . Enlarged prostate    takes Flomax daily  . GERD (gastroesophageal reflux disease)    was on Prilosec but has been off for yrs-no issues now with reflux  . Headache    migraines years ago  . History of bronchitis    > 12yrs ago  . History of colon polyps    benign  . History of dizziness    several yrs ago-was on Meclizine but has been off for several yrs-no problems since  . History of hiatal hernia   . Hypertension    takes Amlodipine and Lisinpril daily  . Joint pain   . Joint swelling   . Nocturia   . PAD (peripheral artery disease) ([facility])     bilateral superficial femoral artery occlusions   . Paroxysmal atrial fibrillation ([facility])   . Pneumonia    several times with most recent within 5 yrs ago  . Restless leg syndrome   . Rupture of left quadriceps tendon   . Shortness of breath dyspnea    with exertion  . Sleep apnea    does not use cpap  . Urinary urgency     Tobacco Use: Social History   Tobacco Use  Smoking Status Current Some Day Smoker  . Packs/day: 0.25  . Years: 45.00  . Pack years: 11.25  . Types: Cigars  . Last attempt to quit: 01/07/2019  . Years since quitting: 1.2  Smokeless Tobacco Never Used  Tobacco Comment   smokes 1/3 of a cigar a day- 12/27/2019    Labs: Recent Review Flowsheet Data    Labs for ITP Cardiac and Pulmonary Rehab Latest Ref Rng & Units 06/29/2008 08/02/2015 09/19/2015 03/11/2019   Cholestrol 0 - 200 mg/dL 153 .[name]. - - -   LDLCALC 0 - 99 mg/dL 99 ... - - -   HDL >39 mg/dL 29(L) - - -   Trlycerides <150 mg/dL 123 - - 163(H)   HCO3 20.0 - 24.0 mEq/L - - 26.8(H) -   TCO2 0 - 100 mmol/L - 24 28 -   O2SAT % - - 99.0 -      Capillary Blood Glucose: No results found for: GLUCAP   Pulmonary Assessment Scores:  Pulmonary Assessment Scores    Row Name 01/27/20 1418         ADL UCSD   ADL Phase Entry     SOB Score total 58           CAT Score   CAT Score 25           mMRC Score   mMRC Score 1            UCSD: Self-administered rating of dyspnea associated with activities of daily living (ADLs) 6-point  scale (0 = "not at all" to 5 = "maximal or unable to do because of breathlessness")  Scoring Scores range from 0 to 120.  Minimally important difference is 5 units  CAT: CAT can identify the health impairment of COPD patients and is better correlated with disease progression.  CAT has a scoring range of zero to 40. The CAT score is classified into four groups of low (less than 10), medium (10 - 20), high (21-30) and very high (31-40) based on the impact level of disease on health status. A CAT score over 10 suggests significant symptoms.  A worsening CAT score could be explained by an exacerbation, poor medication adherence, poor inhaler technique, or progression of COPD or comorbid conditions.  CAT MCID is 2 points  mMRC: mMRC (Modified Medical Research Council) Dyspnea Scale is used to assess the degree of baseline functional disability in patients of respiratory disease due to dyspnea. No minimal important difference is established. A decrease in score of 1 point or greater is considered a positive change.   Pulmonary Function Assessment:  Pulmonary Function Assessment - 01/27/20 1255      Breath   Shortness of Breath Yes;Limiting activity           Exercise Target Goals: Exercise Program Goal: Individual exercise prescription set using results from initial 6 min walk test and THRR while considering  patient's activity barriers and safety.   Exercise Prescription Goal: Initial exercise prescription builds to 30-45 minutes a day of aerobic activity, 2-3 days per week.  Home exercise guidelines will be given to patient during program as part of exercise prescription that the participant will acknowledge.  Activity Barriers & Risk Stratification:  Activity Barriers & Cardiac Risk Stratification - 01/27/20 1251      Activity Barriers & Cardiac Risk Stratification   Activity  Barriers Arthritis;Back Problems;Joint Problems;Deconditioning;Muscular Weakness;Shortness of Breath;Assistive Device    Cardiac Risk Stratification High           6 Minute Walk:  6 Minute Walk    Row Name 01/27/20 1449         6 Minute Walk   Phase Initial     Distance 600 feet     Walk Time 6 minutes     # of Rest Breaks 1     MPH 1.14     METS 0.9     RPE 13     Perceived Dyspnea  15     VO2 Peak 3.16     Symptoms Yes (comment)     Comments 1 seated rest break for 2 min due to cramps in his quads. He rated the cramps a 7/10 for pain     Resting HR 57 bpm     Resting BP 108/80     Resting Oxygen Saturation  96 %     Exercise Oxygen Saturation  during 6 min walk 94 %     Max Ex. HR 89 bpm     Max Ex. BP 128/76     2 Minute Post BP 110/78           Interval HR   1 Minute HR 83     2 Minute HR 86     3 Minute HR 89     4 Minute HR 74     5 Minute HR 74     6 Minute HR 83     2 Minute Post HR 66     Interval Heart Rate? Yes  Interval Oxygen   Interval Oxygen? Yes     Baseline Oxygen Saturation % 96 %     1 Minute Oxygen Saturation % 94 %     1 Minute Liters of Oxygen 2 L     2 Minute Oxygen Saturation % 94 %     2 Minute Liters of Oxygen 2 L     3 Minute Oxygen Saturation % 95 %     3 Minute Liters of Oxygen 2 L     4 Minute Oxygen Saturation % 97 %     4 Minute Liters of Oxygen 2 L     5 Minute Oxygen Saturation % 98 %     5 Minute Liters of Oxygen 2 L     6 Minute Oxygen Saturation % 96 %     6 Minute Liters of Oxygen 2 L     2 Minute Post Oxygen Saturation % 97 %     2 Minute Post Liters of Oxygen 2 L            Oxygen Initial Assessment:  Oxygen Initial Assessment - 01/27/20 1418      Initial 6 min Walk   Oxygen Used Continuous    Liters per minute 2      Program Oxygen Prescription   Program Oxygen Prescription Continuous    Liters per minute 2      Intervention   Short Term Goals To learn and exhibit compliance with exercise,  home and travel O2 prescription;To learn and understand importance of monitoring SPO2 with pulse oximeter and demonstrate accurate use of the pulse oximeter.;To learn and demonstrate proper pursed lip breathing techniques or other breathing techniques.;To learn and demonstrate proper use of respiratory medications;To learn and understand importance of maintaining oxygen saturations>88%    Long  Term Goals Exhibits compliance with exercise, home and travel O2 prescription;Verbalizes importance of monitoring SPO2 with pulse oximeter and return demonstration;Maintenance of O2 saturations>88%;Exhibits proper breathing techniques, such as pursed lip breathing or other method taught during program session;Compliance with respiratory medication;Demonstrates proper use of MDI's           Oxygen Re-Evaluation:  Oxygen Re-Evaluation    Row Name 02/07/20 1534 03/06/20 1520 04/03/20 1533         Program Oxygen Prescription   Program Oxygen Prescription Continuous Continuous Continuous     Liters per minute 2 2 2            Home Oxygen   Home Oxygen Device - - E-Tanks;Home Concentrator     Sleep Oxygen Prescription - - Continuous     Liters per minute - - 2.5     Home Exercise Oxygen Prescription Continuous - Continuous     Liters per minute 2 - 2     Home Resting Oxygen Prescription Continuous - Continuous     Liters per minute 2 - 2     Compliance with Home Oxygen Use Yes - Yes           Goals/Expected Outcomes   Short Term Goals To learn and exhibit compliance with exercise, home and travel O2 prescription;To learn and understand importance of monitoring SPO2 with pulse oximeter and demonstrate accurate use of the pulse oximeter.;To learn and understand importance of maintaining oxygen saturations>88%;To learn and demonstrate proper pursed lip breathing techniques or other breathing techniques.;To learn and demonstrate proper use of respiratory medications To learn and exhibit compliance with  exercise, home and travel O2 prescription;To learn and understand importance of  monitoring SPO2 with pulse oximeter and demonstrate accurate use of the pulse oximeter.;To learn and understand importance of maintaining oxygen saturations>88%;To learn and demonstrate proper pursed lip breathing techniques or other breathing techniques.;To learn and demonstrate proper use of respiratory medications To learn and exhibit compliance with exercise, home and travel O2 prescription;To learn and understand importance of monitoring SPO2 with pulse oximeter and demonstrate accurate use of the pulse oximeter.;To learn and understand importance of maintaining oxygen saturations>88%;To learn and demonstrate proper pursed lip breathing techniques or other breathing techniques.;To learn and demonstrate proper use of respiratory medications     Long  Term Goals Demonstrates proper use of MDI's;Compliance with respiratory medication;Exhibits proper breathing techniques, such as pursed lip breathing or other method taught during program session;Maintenance of O2 saturations>88%;Verbalizes importance of monitoring SPO2 with pulse oximeter and return demonstration;Exhibits compliance with exercise, home and travel O2 prescription Exhibits compliance with exercise, home and travel O2 prescription;Verbalizes importance of monitoring SPO2 with pulse oximeter and return demonstration;Maintenance of O2 saturations>88%;Exhibits proper breathing techniques, such as pursed lip breathing or other method taught during program session;Compliance with respiratory medication;Demonstrates proper use of MDI's Exhibits compliance with exercise, home and travel O2 prescription;Verbalizes importance of monitoring SPO2 with pulse oximeter and return demonstration;Maintenance of O2 saturations>88%;Exhibits proper breathing techniques, such as pursed lip breathing or other method taught during program session;Compliance with respiratory  medication;Demonstrates proper use of MDI's     Goals/Expected Outcomes Compliance Compliance Compliance            Oxygen Discharge (Final Oxygen Re-Evaluation):  Oxygen Re-Evaluation - 04/03/20 1533      Program Oxygen Prescription   Program Oxygen Prescription Continuous    Liters per minute 2      Home Oxygen   Home Oxygen Device E-Tanks;Home Concentrator    Sleep Oxygen Prescription Continuous    Liters per minute 2.5    Home Exercise Oxygen Prescription Continuous    Liters per minute 2    Home Resting Oxygen Prescription Continuous    Liters per minute 2    Compliance with Home Oxygen Use Yes      Goals/Expected Outcomes   Short Term Goals To learn and exhibit compliance with exercise, home and travel O2 prescription;To learn and understand importance of monitoring SPO2 with pulse oximeter and demonstrate accurate use of the pulse oximeter.;To learn and understand importance of maintaining oxygen saturations>88%;To learn and demonstrate proper pursed lip breathing techniques or other breathing techniques.;To learn and demonstrate proper use of respiratory medications    Long  Term Goals Exhibits compliance with exercise, home and travel O2 prescription;Verbalizes importance of monitoring SPO2 with pulse oximeter and return demonstration;Maintenance of O2 saturations>88%;Exhibits proper breathing techniques, such as pursed lip breathing or other method taught during program session;Compliance with respiratory medication;Demonstrates proper use of MDI's    Goals/Expected Outcomes Compliance           Initial Exercise Prescription:  Initial Exercise Prescription - 01/27/20 1400      Date of Initial Exercise RX and Referring Provider   Date 01/27/20    Referring Provider Dr. Shearon Stalls     Expected Discharge Date 05/31/20      Oxygen   Oxygen Continuous    Liters 2      NuStep   Level 1    SPM 60    Minutes 17      Arm Ergometer   Level 1    RPM 60    Minutes 22  Prescription Details   Frequency (times per week) 2    Duration Progress to 30 minutes of continuous aerobic without signs/symptoms of physical distress      Intensity   THRR 40-80% of Max Heartrate 56-112    Ratings of Perceived Exertion 11-13    Perceived Dyspnea 0-4      Resistance Training   Training Prescription Yes    Weight 2 lbs    Reps 10-15           Perform Capillary Blood Glucose checks as needed.  Exercise Prescription Changes:  Exercise Prescription Changes    Row Name 01/31/20 1600 02/07/20 1400 02/21/20 1500 03/06/20 1500 03/08/20 1330     Response to Exercise   Blood Pressure (Admit) 128/78 140/72 110/68 140/82 122/60   Blood Pressure (Exercise) 148/80 148/68 128/70 152/84 120/60   Blood Pressure (Exit) 136/72 122/76 122/60 122/78 130/72   Heart Rate (Admit) 62 bpm 71 bpm 76 bpm 75 bpm 76 bpm   Heart Rate (Exercise) 75 bpm 83 bpm 80 bpm 81 bpm 80 bpm   Heart Rate (Exit) 71 bpm 86 bpm 84 bpm 81 bpm 82 bpm   Oxygen Saturation (Admit) 97 % 96 % 97 % 95 % 92 %   Oxygen Saturation (Exercise) 94 % 95 % 96 % 95 % 96 %   Oxygen Saturation (Exit) 97 % 95 % 96 % 97 % 96 %   Rating of Perceived Exertion (Exercise) 15 15 13 12 12    Perceived Dyspnea (Exercise) 15 15 13 12 12    Duration Continue with 30 min of aerobic exercise without signs/symptoms of physical distress. Continue with 30 min of aerobic exercise without signs/symptoms of physical distress. Continue with 30 min of aerobic exercise without signs/symptoms of physical distress. Continue with 30 min of aerobic exercise without signs/symptoms of physical distress. Continue with 30 min of aerobic exercise without signs/symptoms of physical distress.   Intensity THRR unchanged THRR unchanged THRR unchanged THRR unchanged THRR unchanged     Progression   Progression Continue to progress workloads to maintain intensity without signs/symptoms of physical distress. Continue to progress workloads to maintain  intensity without signs/symptoms of physical distress. Continue to progress workloads to maintain intensity without signs/symptoms of physical distress. Continue to progress workloads to maintain intensity without signs/symptoms of physical distress. Continue to progress workloads to maintain intensity without signs/symptoms of physical distress.     Resistance Training   Training Prescription Yes Yes Yes Yes Yes   Weight 2 lbs 2 lbs 2 lbs 2 lbs 2 lbs   Reps 10-15 10-15 10-15 10-15 10-15   Time 10 Minutes 10 Minutes 10 Minutes 10 Minutes 10 Minutes     Oxygen   Oxygen Continuous Continuous Continuous Continuous Continuous   Liters 2 2 2 2 2      NuStep   Level 1 1 1 1 1    SPM 78 74 70 86 83   Minutes 17 17 17 17 17    METs 1.7 1.4 1.4 2.1 1.8     Arm Ergometer   Level 1 1 1 1 1    RPM 49 41 45 49 40   Minutes 22 22 22 22 22    METs 1.5 1.3 1.5 1.5 1.2   Row Name 04/03/20 1500             Response to Exercise   Blood Pressure (Admit) 132/68       Blood Pressure (Exercise) 132/74       Blood Pressure (  Exit) 112/64       Heart Rate (Admit) 73 bpm       Heart Rate (Exercise) 81 bpm       Heart Rate (Exit) 80 bpm       Oxygen Saturation (Admit) 98 %       Oxygen Saturation (Exercise) 97 %       Oxygen Saturation (Exit) 98 %       Rating of Perceived Exertion (Exercise) 12       Perceived Dyspnea (Exercise) 12       Duration Continue with 30 min of aerobic exercise without signs/symptoms of physical distress.       Intensity THRR unchanged               Progression   Progression Continue to progress workloads to maintain intensity without signs/symptoms of physical distress.               Resistance Training   Training Prescription Yes       Weight 3 lbs       Reps 10-15       Time 10 Minutes               Oxygen   Oxygen Continuous       Liters 2               NuStep   Level 1       SPM 79       Minutes 17       METs 1.8               Arm Ergometer   Level  1       RPM 45       Minutes 22       METs 1.5              Exercise Comments:  Exercise Comments    Row Name 01/31/20 1604           Exercise Comments Pt completed his first exercise session in pulmonary rehab today. He is severely deconditioned. He complained of shoulder pain during the warm up and was unable to do a portion of the hand weight activities. He complained of cramps in his quads while on the stepper. He was otherwise able to tolerate the exercise session.              Exercise Goals and Review:  Exercise Goals    Row Name 01/27/20 1452 02/07/20 1535 03/06/20 1514 04/03/20 1530       Exercise Goals   Increase Physical Activity Yes Yes Yes Yes    Intervention Provide advice, education, support and counseling about physical activity/exercise needs.;Develop an individualized exercise prescription for aerobic and resistive training based on initial evaluation findings, risk stratification, comorbidities and participant's personal goals. Provide advice, education, support and counseling about physical activity/exercise needs.;Develop an individualized exercise prescription for aerobic and resistive training based on initial evaluation findings, risk stratification, comorbidities and participant's personal goals. Provide advice, education, support and counseling about physical activity/exercise needs.;Develop an individualized exercise prescription for aerobic and resistive training based on initial evaluation findings, risk stratification, comorbidities and participant's personal goals. Provide advice, education, support and counseling about physical activity/exercise needs.;Develop an individualized exercise prescription for aerobic and resistive training based on initial evaluation findings, risk stratification, comorbidities and participant's personal goals.    Expected Outcomes Short Term: Attend rehab on a regular basis to increase amount of physical activity.;Long Term:  Add in home exercise to make exercise part of routine and to increase amount of physical activity.;Long Term: Exercising regularly at least 3-5 days a week. Short Term: Attend rehab on a regular basis to increase amount of physical activity.;Long Term: Add in home exercise to make exercise part of routine and to increase amount of physical activity.;Long Term: Exercising regularly at least 3-5 days a week. Short Term: Attend rehab on a regular basis to increase amount of physical activity.;Long Term: Add in home exercise to make exercise part of routine and to increase amount of physical activity.;Long Term: Exercising regularly at least 3-5 days a week. Short Term: Attend rehab on a regular basis to increase amount of physical activity.;Long Term: Add in home exercise to make exercise part of routine and to increase amount of physical activity.;Long Term: Exercising regularly at least 3-5 days a week.    Increase Strength and Stamina Yes Yes Yes Yes    Intervention Provide advice, education, support and counseling about physical activity/exercise needs.;Develop an individualized exercise prescription for aerobic and resistive training based on initial evaluation findings, risk stratification, comorbidities and participant's personal goals. Provide advice, education, support and counseling about physical activity/exercise needs.;Develop an individualized exercise prescription for aerobic and resistive training based on initial evaluation findings, risk stratification, comorbidities and participant's personal goals. Provide advice, education, support and counseling about physical activity/exercise needs.;Develop an individualized exercise prescription for aerobic and resistive training based on initial evaluation findings, risk stratification, comorbidities and participant's personal goals. Provide advice, education, support and counseling about physical activity/exercise needs.;Develop an individualized exercise  prescription for aerobic and resistive training based on initial evaluation findings, risk stratification, comorbidities and participant's personal goals.    Expected Outcomes Short Term: Increase workloads from initial exercise prescription for resistance, speed, and METs.;Short Term: Perform resistance training exercises routinely during rehab and add in resistance training at home;Long Term: Improve cardiorespiratory fitness, muscular endurance and strength as measured by increased METs and functional capacity (6MWT) Short Term: Increase workloads from initial exercise prescription for resistance, speed, and METs.;Short Term: Perform resistance training exercises routinely during rehab and add in resistance training at home;Long Term: Improve cardiorespiratory fitness, muscular endurance and strength as measured by increased METs and functional capacity (6MWT) Short Term: Increase workloads from initial exercise prescription for resistance, speed, and METs.;Short Term: Perform resistance training exercises routinely during rehab and add in resistance training at home;Long Term: Improve cardiorespiratory fitness, muscular endurance and strength as measured by increased METs and functional capacity (6MWT) Short Term: Increase workloads from initial exercise prescription for resistance, speed, and METs.;Short Term: Perform resistance training exercises routinely during rehab and add in resistance training at home;Long Term: Improve cardiorespiratory fitness, muscular endurance and strength as measured by increased METs and functional capacity (6MWT)    Able to understand and use rate of perceived exertion (RPE) scale Yes Yes Yes Yes    Intervention Provide education and explanation on how to use RPE scale Provide education and explanation on how to use RPE scale Provide education and explanation on how to use RPE scale Provide education and explanation on how to use RPE scale    Expected Outcomes Short Term:  Able to use RPE daily in rehab to express subjective intensity level;Long Term:  Able to use RPE to guide intensity level when exercising independently Short Term: Able to use RPE daily in rehab to express subjective intensity level;Long Term:  Able to use RPE to guide intensity level when exercising independently Short Term: Able to  use RPE daily in rehab to express subjective intensity level;Long Term:  Able to use RPE to guide intensity level when exercising independently Short Term: Able to use RPE daily in rehab to express subjective intensity level;Long Term:  Able to use RPE to guide intensity level when exercising independently    Able to understand and use Dyspnea scale Yes Yes Yes Yes    Intervention Provide education and explanation on how to use Dyspnea scale Provide education and explanation on how to use Dyspnea scale Provide education and explanation on how to use Dyspnea scale Provide education and explanation on how to use Dyspnea scale    Expected Outcomes Short Term: Able to use Dyspnea scale daily in rehab to express subjective sense of shortness of breath during exertion;Long Term: Able to use Dyspnea scale to guide intensity level when exercising independently Short Term: Able to use Dyspnea scale daily in rehab to express subjective sense of shortness of breath during exertion;Long Term: Able to use Dyspnea scale to guide intensity level when exercising independently Long Term: Able to use Dyspnea scale to guide intensity level when exercising independently;Short Term: Able to use Dyspnea scale daily in rehab to express subjective sense of shortness of breath during exertion Long Term: Able to use Dyspnea scale to guide intensity level when exercising independently;Short Term: Able to use Dyspnea scale daily in rehab to express subjective sense of shortness of breath during exertion    Knowledge and understanding of Target Heart Rate Range (THRR) Yes Yes Yes Yes    Intervention Provide  education and explanation of THRR including how the numbers were predicted and where they are located for reference Provide education and explanation of THRR including how the numbers were predicted and where they are located for reference Provide education and explanation of THRR including how the numbers were predicted and where they are located for reference Provide education and explanation of THRR including how the numbers were predicted and where they are located for reference    Expected Outcomes Short Term: Able to state/look up THRR;Long Term: Able to use THRR to govern intensity when exercising independently;Short Term: Able to use daily as guideline for intensity in rehab Short Term: Able to state/look up THRR;Long Term: Able to use THRR to govern intensity when exercising independently;Short Term: Able to use daily as guideline for intensity in rehab Short Term: Able to state/look up THRR;Long Term: Able to use THRR to govern intensity when exercising independently;Short Term: Able to use daily as guideline for intensity in rehab Short Term: Able to state/look up THRR;Long Term: Able to use THRR to govern intensity when exercising independently;Short Term: Able to use daily as guideline for intensity in rehab    Understanding of Exercise Prescription Yes Yes Yes Yes    Intervention Provide education, explanation, and written materials on patient's individual exercise prescription Provide education, explanation, and written materials on patient's individual exercise prescription Provide education, explanation, and written materials on patient's individual exercise prescription Provide education, explanation, and written materials on patient's individual exercise prescription    Expected Outcomes Short Term: Able to explain program exercise prescription;Long Term: Able to explain home exercise prescription to exercise independently Short Term: Able to explain program exercise prescription;Long Term:  Able to explain home exercise prescription to exercise independently Short Term: Able to explain program exercise prescription;Long Term: Able to explain home exercise prescription to exercise independently Short Term: Able to explain program exercise prescription;Long Term: Able to explain home exercise prescription to exercise independently  Exercise Goals Re-Evaluation :  Exercise Goals Re-Evaluation    Row Name 02/07/20 1535 03/06/20 1514 04/03/20 1530         Exercise Goal Re-Evaluation   Exercise Goals Review Increase Physical Activity;Increase Strength and Stamina;Able to understand and use rate of perceived exertion (RPE) scale;Able to understand and use Dyspnea scale;Knowledge and understanding of Target Heart Rate Range (THRR);Understanding of Exercise Prescription Increase Physical Activity;Increase Strength and Stamina;Able to understand and use rate of perceived exertion (RPE) scale;Able to understand and use Dyspnea scale;Knowledge and understanding of Target Heart Rate Range (THRR);Understanding of Exercise Prescription Increase Physical Activity;Increase Strength and Stamina;Able to understand and use rate of perceived exertion (RPE) scale;Able to understand and use Dyspnea scale;Knowledge and understanding of Target Heart Rate Range (THRR);Understanding of Exercise Prescription     Comments Pt has completed 3 exercise sessions. Pt has been complaining of pain in his left quad. He was wheezing today and did not bring his rescue inhaler. We encouraged him to bring his rescue inhaler to rehab. He also complains of left shoulder pain. He is exercising at 1.4 METs on the NuStep. Will continue to monitor and progress exercise as able. Patient has completed 9 exercise sessions. He has tolerated exercise well. He still complains of pain in his legs. He is progressing exercise well also. He is improving on his spm and he is determined when he comes into rehab to get as far as he can,  distance wise, on the machines. He is currently exercising at 2.1 METs on the NuStep. Will continue to monitor and progress exercise as able. Pt has completed 13 exercise sessions. He missed several visits this month due to illness and regressed in terms of his conditioning. His work of breathing remains elevated and at times can be concerning. He currently exercises at 1.8 METs on the stepper. Will continue to monitor and progress as able.     Expected Outcomes Through exercising at rehab and at home patient will reach their goals. Through exercise at rehab and with a home exercise program, patient will achieve their goals. Through exercise at rehab and with a home exercise program, patient will achieve their goals.            Discharge Exercise Prescription (Final Exercise Prescription Changes):  Exercise Prescription Changes - 04/03/20 1500      Response to Exercise   Blood Pressure (Admit) 132/68    Blood Pressure (Exercise) 132/74    Blood Pressure (Exit) 112/64    Heart Rate (Admit) 73 bpm    Heart Rate (Exercise) 81 bpm    Heart Rate (Exit) 80 bpm    Oxygen Saturation (Admit) 98 %    Oxygen Saturation (Exercise) 97 %    Oxygen Saturation (Exit) 98 %    Rating of Perceived Exertion (Exercise) 12    Perceived Dyspnea (Exercise) 12    Duration Continue with 30 min of aerobic exercise without signs/symptoms of physical distress.    Intensity THRR unchanged      Progression   Progression Continue to progress workloads to maintain intensity without signs/symptoms of physical distress.      Resistance Training   Training Prescription Yes    Weight 3 lbs    Reps 10-15    Time 10 Minutes      Oxygen   Oxygen Continuous    Liters 2      NuStep   Level 1    SPM 79    Minutes 17    METs  1.8      Arm Ergometer   Level 1    RPM 45    Minutes 22    METs 1.5           Nutrition:  Target Goals: Understanding of nutrition guidelines, daily intake of sodium 1500mg ,  cholesterol 200mg , calories 30% from fat and 7% or less from saturated fats, daily to have 5 or more servings of fruits and vegetables.  Biometrics:  Pre Biometrics - 03/06/20 1519      Pre Biometrics   Weight 100.1 kg            Nutrition Therapy Plan and Nutrition Goals:  Nutrition Therapy & Goals - 03/30/20 1257      Personal Nutrition Goals   Comments We will continue to provide heart healthy nutritional education through hand-outs.      Intervention Plan   Intervention Nutrition handout(s) given to patient.           Nutrition Assessments:  Nutrition Assessments - 01/27/20 1418      MEDFICTS Scores   Pre Score 21          MEDIFICTS Score Key:  ?70 Need to make dietary changes   40-70 Heart Healthy Diet  ? 40 Therapeutic Level Cholesterol Diet   Picture Your Plate Scores:  <41 Unhealthy dietary pattern with much room for improvement.  41-50 Dietary pattern unlikely to meet recommendations for good health and room for improvement.  51-60 More healthful dietary pattern, with some room for improvement.   >60 Healthy dietary pattern, although there may be some specific behaviors that could be improved.    Nutrition Goals Re-Evaluation:   Nutrition Goals Discharge (Final Nutrition Goals Re-Evaluation):   Psychosocial: Target Goals: Acknowledge presence or absence of significant depression and/or stress, maximize coping skills, provide positive support system. Participant is able to verbalize types and ability to use techniques and skills needed for reducing stress and depression.  Initial Review & Psychosocial Screening:  Initial Psych Review & Screening - 01/27/20 1256      Initial Review   Current issues with None Identified      Family Dynamics   Good Support System? Yes    Comments His wife is his support system. His dog just passed away who was also part of his support system. His daughter also supports him when she can. She is busy with  her two children.      Barriers   Psychosocial barriers to participate in program There are no identifiable barriers or psychosocial needs.      Screening Interventions   Interventions Encouraged to exercise;Other (comment)           Quality of Life Scores:  Quality of Life - 01/27/20 1449      Quality of Life   Select Quality of Life      Quality of Life Scores   Health/Function Pre 20.43 %    Socioeconomic Pre 25.75 %    Psych/Spiritual Pre 26.36 %    Family Pre 27.6 %    GLOBAL Pre 23.74 %          Scores of 19 and below usually indicate a poorer quality of life in these areas.  A difference of  2-3 points is a clinically meaningful difference.  A difference of 2-3 points in the total score of the Quality of Life Index has been associated with significant improvement in overall quality of life, self-image, physical symptoms, and general health in studies assessing  change in quality of life.   PHQ-9: Recent Review Flowsheet Data    Depression screen [facility] 2/9 01/27/2020 05/07/2017   Decreased Interest 0 0   Down, Depressed, Hopeless 0 0   PHQ - 2 Score 0 0   Altered sleeping 0 -   Tired, decreased energy 1 -   Change in appetite 0 -   Feeling bad or failure about yourself  0 -   Trouble concentrating 0 -   Moving slowly or fidgety/restless 0 -   Suicidal thoughts 0 -   PHQ-9 Score 1 -   Difficult doing work/chores Somewhat difficult -     Interpretation of Total Score  Total Score Depression Severity:  1-4 = Minimal depression, 5-9 = Mild depression, 10-14 = Moderate depression, 15-19 = Moderately severe depression, 20-27 = Severe depression   Psychosocial Evaluation and Intervention:   Psychosocial Re-Evaluation:  Psychosocial Re-Evaluation    [place] Name 02/06/20 1327 03/05/20 0858 03/30/20 1258         Psychosocial Re-Evaluation   Current issues with None Identified None Identified None Identified     Comments Patient has no psychosocial issues identified.  His initial QOL score was 23.74 and his PHQ-9 score was 1. He has a positive attitude and works hard duing his sessions. Will continue to monitor. Patient has no psychosocial issues identified. His initial QOL score was 23.74 and his PHQ-9 score was 1. He continues to demonstrate a positive outlook and works hard duing his sessions. Will continue to monitor. Patient has no psychosocial issues identified. He has completed 12 sessions with relatively consistent attendance. He has completed 12 sessions and continues to demonstrate a positive outlook and works hard duing his sessions. Will continue to monitor.     Expected Outcomes Patient will have no psychosocial issues identified at discharge. Patient will have no psychosocial issues identified at discharge. Patient will have no psychosocial issues identified at discharge.     Interventions Encouraged to attend Pulmonary Rehabilitation for the exercise;Relaxation education;Stress management education Encouraged to attend Pulmonary Rehabilitation for the exercise;Relaxation education;Stress management education Encouraged to attend Pulmonary Rehabilitation for the exercise;Relaxation education;Stress management education     Continue Psychosocial Services  No Follow up required No Follow up required No Follow up required            Psychosocial Discharge (Final Psychosocial Re-Evaluation):  Psychosocial Re-Evaluation - 03/30/20 1258      Psychosocial Re-Evaluation   Current issues with None Identified    Comments Patient has no psychosocial issues identified. He has completed 12 sessions with relatively consistent attendance. He has completed 12 sessions and continues to demonstrate a positive outlook and works hard duing his sessions. Will continue to monitor.    Expected Outcomes Patient will have no psychosocial issues identified at discharge.    Interventions Encouraged to attend Pulmonary Rehabilitation for the exercise;Relaxation education;Stress  management education    Continue Psychosocial Services  No Follow up required            Education: Education Goals: Education classes will be provided on a weekly basis, covering required topics. Participant will state understanding/return demonstration of topics presented.  Learning Barriers/Preferences:   Education Topics: How Lungs Work and Diseases: - Discuss the anatomy of the lungs and diseases that can affect the lungs, such as COPD.   Exercise: -Discuss the importance of exercise, FITT principles of exercise, normal and abnormal responses to exercise, and how to exercise safely.   Environmental Irritants: -Discuss types of  environmental irritants and how to limit exposure to environmental irritants.   Meds/Inhalers and oxygen: - Discuss respiratory medications, definition of an inhaler and oxygen, and the proper way to use an inhaler and oxygen.   Energy Saving Techniques: - Discuss methods to conserve energy and decrease shortness of breath when performing activities of daily living.    Bronchial Hygiene / Breathing Techniques: - Discuss breathing mechanics, pursed-lip breathing technique,  proper posture, effective ways to clear airways, and other functional breathing techniques   Cleaning Equipment: - Provides group verbal and written instruction about the health risks of elevated stress, cause of high stress, and healthy ways to reduce stress. Flowsheet Row PULMONARY REHAB CHRONIC OBSTRUCTIVE PULMONARY DISEASE from 03/08/2020 in [place]  Date 02/02/20  Educator DJ  Instruction Review Code 1- Verbalizes Understanding      Nutrition I: Fats: - Discuss the types of cholesterol, what cholesterol does to the body, and how cholesterol levels can be controlled. Flowsheet Row PULMONARY REHAB CHRONIC OBSTRUCTIVE PULMONARY DISEASE from 03/08/2020 in [place]  Date 02/09/20  Educator Etheleen Mayhew  Instruction  Review Code 1- Verbalizes Understanding      Nutrition II: Labels: -Discuss the different components of food labels and how to read food labels.   Respiratory Infections: - Discuss the signs and symptoms of respiratory infections, ways to prevent respiratory infections, and the importance of seeking medical treatment when having a respiratory infection. Flowsheet Row PULMONARY REHAB CHRONIC OBSTRUCTIVE PULMONARY DISEASE from 03/08/2020 in [place]  Date 02/23/20  Educator DF  Instruction Review Code 1- Verbalizes Understanding      Stress I: Signs and Symptoms: - Discuss the causes of stress, how stress may lead to anxiety and depression, and ways to limit stress. Flowsheet Row PULMONARY REHAB CHRONIC OBSTRUCTIVE PULMONARY DISEASE from 03/08/2020 in [place]  Date 03/01/20  Educator [facility]  Instruction Review Code 1- Verbalizes Understanding      Stress II: Relaxation: -Discuss relaxation techniques to limit stress. Flowsheet Row PULMONARY REHAB CHRONIC OBSTRUCTIVE PULMONARY DISEASE from 03/08/2020 in [place]  Date 03/08/20  Educator [facility]  Instruction Review Code 2- Demonstrated Understanding      Oxygen for Home/Travel: - Discuss how to prepare for travel when on oxygen and proper ways to transport and store oxygen to ensure safety.   Knowledge Questionnaire Score:  Knowledge Questionnaire Score - 01/27/20 1419      Knowledge Questionnaire Score   Pre Score 16/18           Core Components/Risk Factors/Patient Goals at Admission:  Personal Goals and Risk Factors at Admission - 01/27/20 1419      Core Components/Risk Factors/Patient Goals on Admission   Improve shortness of breath with ADL's Yes    Intervention Provide education, individualized exercise plan and daily activity instruction to help decrease symptoms of SOB with activities of daily living.    Expected Outcomes Short Term: Improve  cardiorespiratory fitness to achieve a reduction of symptoms when performing ADLs;Long Term: Be able to perform more ADLs without symptoms or delay the onset of symptoms    Heart Failure Yes    Intervention Provide a combined exercise and nutrition program that is supplemented with education, support and counseling about heart failure. Directed toward relieving symptoms such as shortness of breath, decreased exercise tolerance, and extremity edema.    Expected Outcomes Improve functional capacity of life;Short term: Attendance in program 2-3 days a week with increased exercise  capacity. Reported lower sodium intake. Reported increased fruit and vegetable intake. Reports medication compliance.;Short term: Daily weights obtained and reported for increase. Utilizing diuretic protocols set by physician.;Long term: Adoption of self-care skills and reduction of barriers for early signs and symptoms recognition and intervention leading to self-care maintenance.           Core Components/Risk Factors/Patient Goals Review:   Goals and Risk Factor Review    Row Name 02/06/20 1328 03/05/20 0858 03/30/20 1259         Core Components/Risk Factors/Patient Goals Review   Review Patient is new to the program completing 2 sessions. 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 His personal goals are to return to his ADL's and returnto walking long distances. Will continue to monitor for progress. Patient has completed 8 sessions gaining 2 lbs since last 30 day review. He was referred to pulmonary rehab with COPD with chronic bronchitis and emphysema. He is doing the best he can in the sessions. He continues to exhibit labored breathing with severe stridor. He continues to smoke cigars occasionally. His personal goals are to return to his ADL's and retrun to walking long distance. Will continue to monitor for progress. Patient has completed 12 sessions gaining 2 lbs since his last 30 day review. He does work  hard during sessions. He continues to exhibit labored breathing with severe stridor. He started prednisone back in December. He does miss some sessions due to SOB. He continue to smoke cigars occasionally. His personal goals for the program are to be able to walk long distance and to return to doing his ADL's. We will continue to monitor as he works toward meeting these goals.     Expected Outcomes Patient will complete the program meeting both program and personal goals. Patient will complete the program meeting both program and personal goals. Patient will complete the program meeting both program and personal goals.            Core Components/Risk Factors/Patient Goals at Discharge (Final Review):   Goals and Risk Factor Review - 03/30/20 1259      Core Components/Risk Factors/Patient Goals Review   Review Patient has completed 12 sessions gaining 2 lbs since his last 30 day review. He does work hard during sessions. He continues to exhibit labored breathing with severe stridor. He started prednisone back in December. He does miss some sessions due to SOB. He continue to smoke cigars occasionally. His personal goals for the program are to be able to walk long distance and to return to doing his ADL's. We will continue to monitor as he works toward meeting these goals.    Expected Outcomes Patient will complete the program meeting both program and personal goals.           ITP Comments:   Comments: ITP REVIEW Pt is making expected progress toward pulmonary rehab goals after completing 14 sessions. Recommend continued exercise, life style modification, education, and utilization of breathing techniques to increase stamina and strength and decrease shortness of breath with exertion.

## 2020-04-05 NOTE — Progress Notes (Signed)
Daily Session Note  Patient Details  Name: Derrick Moon MRN: [id number] Date of Birth: 1941/02/15 Referring Provider:   Flowsheet Row PULMONARY REHAB COPD ORIENTATION from 01/27/2020 in [place]  Referring Provider Dr. Shearon Stalls       Encounter Date: 04/05/2020  Check In:  Session Check In - 04/05/20 1330      Check-In   Supervising physician immediately available to respond to emergencies [facility] MD immediately available    Physician(s) Dr. Harl Bowie    Location [facility]    Staff Present Geanie Cooley, RN;Madison Audria Nine, MS, Exercise Physiologist;Aime Carreras Kris Mouton, MS, ACSM-CEP, Exercise Physiologist    Virtual Visit No    Medication changes reported     No    Fall or balance concerns reported    No    Tobacco Cessation No Change    Warm-up and Cool-down Performed as group-led instruction    Resistance Training Performed Yes    VAD Patient? No    PAD/SET Patient? No      Pain Assessment   Currently in Pain? No/denies    Pain Score 0-No pain    Multiple Pain Sites No           Capillary Blood Glucose: No results found for this or any previous visit (from the past 24 hour(s)).    Social History   Tobacco Use  Smoking Status Current Some Day Smoker  . Packs/day: 0.25  . Years: 45.00  . Pack years: 11.25  . Types: Cigars  . Last attempt to quit: 01/07/2019  . Years since quitting: 1.2  Smokeless Tobacco Never Used  Tobacco Comment   smokes 1/3 of a cigar a day- 12/27/2019    Goals Met:  Independence with exercise equipment Exercise tolerated well No report of cardiac concerns or symptoms Strength training completed today  Goals Unmet:  Not Applicable  Comments: checkout time is 1430   Dr. Kathie Dike is Medical Director for [facility].

## 2020-04-10 ENCOUNTER — Encounter (HOSPITAL_COMMUNITY): Payer: PPO

## 2020-04-12 ENCOUNTER — Encounter (HOSPITAL_COMMUNITY)
Admission: RE | Admit: 2020-04-12 | Discharge: 2020-04-12 | Disposition: A | Discharge status: Home / Self Care | Payer: PPO | Source: Ambulatory Visit | Attending: Internal Medicine | Admitting: Internal Medicine

## 2020-04-12 ENCOUNTER — Other Ambulatory Visit: Payer: Self-pay

## 2020-04-12 VITALS — Wt 227.3 lb

## 2020-04-12 DIAGNOSIS — Z79899 Other long term (current) drug therapy: Secondary | ICD-10-CM | POA: Diagnosis not present

## 2020-04-12 DIAGNOSIS — I5032 Chronic diastolic (congestive) heart failure: Secondary | ICD-10-CM | POA: Diagnosis not present

## 2020-04-12 DIAGNOSIS — J449 Chronic obstructive pulmonary disease, unspecified: Secondary | ICD-10-CM | POA: Diagnosis not present

## 2020-04-12 NOTE — Progress Notes (Signed)
Daily Session Note  Patient Details  Name: Derrick Moon MRN: [id number] Date of Birth: Nov 13, 1940 Referring Provider:   Flowsheet Row PULMONARY REHAB COPD ORIENTATION from 01/27/2020 in [place]  Referring Provider Dr. Shearon Stalls       Encounter Date: 04/12/2020  Check In:  Session Check In - 04/12/20 1330      Check-In   Supervising physician immediately available to respond to emergencies [facility] MD immediately available    Physician(s) Dr. Domenic Polite    Location [facility]    Staff Present Geanie Cooley, RN;Leira Regino Wynetta Emery, RN, BSN;Madison Audria Nine, MS, Exercise Physiologist    Virtual Visit No    Medication changes reported     No    Fall or balance concerns reported    No    Tobacco Cessation No Change    Warm-up and Cool-down Performed as group-led instruction    Resistance Training Performed Yes    VAD Patient? No    PAD/SET Patient? No      Pain Assessment   Currently in Pain? No/denies    Pain Score 0-No pain    Multiple Pain Sites No           Capillary Blood Glucose: No results found for this or any previous visit (from the past 24 hour(s)).    Social History   Tobacco Use  Smoking Status Current Some Day Smoker  . Packs/day: 0.25  . Years: 45.00  . Pack years: 11.25  . Types: Cigars  . Last attempt to quit: 01/07/2019  . Years since quitting: 1.2  Smokeless Tobacco Never Used  Tobacco Comment   smokes 1/3 of a cigar a day- 12/27/2019    Goals Met:  Proper associated with RPD/PD & O2 Sat Independence with exercise equipment Improved SOB with ADL's Using PLB without cueing & demonstrates good technique Exercise tolerated well No report of cardiac concerns or symptoms Strength training completed today  Goals Unmet:  Not Applicable  Comments: Check out 1430.   Dr. Kathie Dike is Medical Director for [facility] Pulmonary Rehab.

## 2020-04-17 ENCOUNTER — Encounter (HOSPITAL_COMMUNITY): Payer: PPO

## 2020-04-19 ENCOUNTER — Encounter (HOSPITAL_COMMUNITY): Payer: PPO

## 2020-04-24 ENCOUNTER — Other Ambulatory Visit: Payer: Self-pay

## 2020-04-24 ENCOUNTER — Encounter (HOSPITAL_COMMUNITY)
Admission: RE | Admit: 2020-04-24 | Discharge: 2020-04-24 | Disposition: A | Discharge status: Home / Self Care | Payer: PPO | Source: Ambulatory Visit | Attending: Internal Medicine | Admitting: Internal Medicine

## 2020-04-24 DIAGNOSIS — J449 Chronic obstructive pulmonary disease, unspecified: Secondary | ICD-10-CM | POA: Insufficient documentation

## 2020-04-24 NOTE — Progress Notes (Signed)
Daily Session Note  Patient Details  Name: Derrick Moon MRN: [id number] Date of Birth: 1940-05-05 Referring Provider:   Flowsheet Row PULMONARY REHAB COPD ORIENTATION from 01/27/2020 in [place]  Referring Provider Dr. Shearon Stalls       Encounter Date: 04/24/2020  Check In:  Session Check In - 04/24/20 1330      Check-In   Supervising physician immediately available to respond to emergencies [facility] MD immediately available    Physician(s) Dr. Domenic Polite    Location [facility]    Staff Present Cathren Harsh, MS, Exercise Physiologist;Phyllis Billingsley, RN    Virtual Visit No    Medication changes reported     No    Fall or balance concerns reported    No    Tobacco Cessation No Change    Warm-up and Cool-down Performed as group-led instruction    Resistance Training Performed Yes    VAD Patient? No    PAD/SET Patient? No      Pain Assessment   Currently in Pain? No/denies    Multiple Pain Sites No           Capillary Blood Glucose: No results found for this or any previous visit (from the past 24 hour(s)).    Social History   Tobacco Use  Smoking Status Current Some Day Smoker  . Packs/day: 0.25  . Years: 45.00  . Pack years: 11.25  . Types: Cigars  . Last attempt to quit: 01/07/2019  . Years since quitting: 1.2  Smokeless Tobacco Never Used  Tobacco Comment   smokes 1/3 of a cigar a day- 12/27/2019    Goals Met:  Independence with exercise equipment Exercise tolerated well No report of cardiac concerns or symptoms Strength training completed today  Goals Unmet:  Not Applicable  Comments: check out 1430   Dr. Kathie Dike is Medical Director for [facility] Pulmonary Rehab.

## 2020-04-26 ENCOUNTER — Encounter (HOSPITAL_COMMUNITY): Payer: PPO

## 2020-04-26 DIAGNOSIS — L814 Other melanin hyperpigmentation: Secondary | ICD-10-CM | POA: Diagnosis not present

## 2020-04-26 DIAGNOSIS — D225 Melanocytic nevi of trunk: Secondary | ICD-10-CM | POA: Diagnosis not present

## 2020-04-26 DIAGNOSIS — L821 Other seborrheic keratosis: Secondary | ICD-10-CM | POA: Diagnosis not present

## 2020-04-26 DIAGNOSIS — L570 Actinic keratosis: Secondary | ICD-10-CM | POA: Diagnosis not present

## 2020-04-26 DIAGNOSIS — D485 Neoplasm of uncertain behavior of skin: Secondary | ICD-10-CM | POA: Diagnosis not present

## 2020-04-27 DIAGNOSIS — C44329 Squamous cell carcinoma of skin of other parts of face: Secondary | ICD-10-CM | POA: Diagnosis not present

## 2020-04-27 DIAGNOSIS — C44622 Squamous cell carcinoma of skin of right upper limb, including shoulder: Secondary | ICD-10-CM | POA: Diagnosis not present

## 2020-05-01 ENCOUNTER — Other Ambulatory Visit: Payer: Self-pay

## 2020-05-01 ENCOUNTER — Encounter (HOSPITAL_COMMUNITY)
Admission: RE | Admit: 2020-05-01 | Discharge: 2020-05-01 | Disposition: A | Discharge status: Home / Self Care | Payer: PPO | Source: Ambulatory Visit | Attending: Internal Medicine | Admitting: Internal Medicine

## 2020-05-01 DIAGNOSIS — J449 Chronic obstructive pulmonary disease, unspecified: Secondary | ICD-10-CM

## 2020-05-01 NOTE — Progress Notes (Signed)
I have reviewed a Home Exercise Prescription with Derrick Moon . Derrick Moon is currently exercising at home.  The patient was advised to walk 2 days a week for 20 minutes outside of rehab.  Derrick Moon and I discussed how to progress their exercise prescription.  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  The patient stated that they understand the exercise prescription.  We reviewed exercise guidelines, target heart rate during exercise, RPE Scale, weather conditions, NTG use, endpoints for exercise, warmup and cool down.  Patient is encouraged to come to me with any questions. I will continue to follow up with the patient to assist them with progression and safety.

## 2020-05-01 NOTE — Progress Notes (Signed)
Daily Session Note  Patient Details  Name: Derrick Moon MRN: [id number] Date of Birth: 1941/02/13 Referring Provider:   Flowsheet Row PULMONARY REHAB COPD ORIENTATION from 01/27/2020 in [place]  Referring Provider Dr. Shearon Stalls       Encounter Date: 05/01/2020  Check In:  Session Check In - 05/01/20 1330      Check-In   Supervising physician immediately available to respond to emergencies [facility] MD immediately available    Physician(s) Dr. Harl Bowie    Location [facility]    Staff Present Geanie Cooley, RN;Madison Audria Nine, MS, Exercise Physiologist;Dalton Kris Mouton, MS, ACSM-CEP, Exercise Physiologist    Virtual Visit No    Medication changes reported     No    Fall or balance concerns reported    No    Tobacco Cessation No Change    Warm-up and Cool-down Performed as group-led instruction    Resistance Training Performed Yes    VAD Patient? No    PAD/SET Patient? No      Pain Assessment   Currently in Pain? No/denies    Pain Score 0-No pain    Multiple Pain Sites No           Capillary Blood Glucose: No results found for this or any previous visit (from the past 24 hour(s)).    Social History   Tobacco Use  Smoking Status Current Some Day Smoker  . Packs/day: 0.25  . Years: 45.00  . Pack years: 11.25  . Types: Cigars  . Last attempt to quit: 01/07/2019  . Years since quitting: 1.3  Smokeless Tobacco Never Used  Tobacco Comment   smokes 1/3 of a cigar a day- 12/27/2019    Goals Met:  Proper associated with RPD/PD & O2 Sat Independence with exercise equipment Using PLB without cueing & demonstrates good technique Exercise tolerated well No report of cardiac concerns or symptoms Strength training completed today  Goals Unmet:  Not Applicable  Comments: check out @ 2:30pm   Dr. Kathie Dike is Medical Director for [facility] Pulmonary Rehab.

## 2020-05-03 ENCOUNTER — Other Ambulatory Visit: Payer: Self-pay

## 2020-05-03 ENCOUNTER — Encounter (HOSPITAL_COMMUNITY)
Admission: RE | Admit: 2020-05-03 | Discharge: 2020-05-03 | Disposition: A | Discharge status: Home / Self Care | Payer: PPO | Source: Ambulatory Visit | Attending: Internal Medicine | Admitting: Internal Medicine

## 2020-05-03 DIAGNOSIS — J449 Chronic obstructive pulmonary disease, unspecified: Secondary | ICD-10-CM | POA: Diagnosis not present

## 2020-05-03 NOTE — Progress Notes (Signed)
Pulmonary Individual Treatment Plan  Patient Details  Name: Derrick Moon MRN: [id number] Date of Birth: 03/02/1941 Referring Provider:   Flowsheet Row PULMONARY REHAB COPD ORIENTATION from 01/27/2020 in [facility]  Referring Provider Dr. Shearon Stalls       Initial Encounter Date:  Flowsheet Row PULMONARY REHAB COPD ORIENTATION from 01/27/2020 in [facility]  Date 01/27/20      Visit Diagnosis: COPD with chronic bronchitis and emphysema ([place])  Patient's Home Medications on Admission:   Current Outpatient Medications:  .  acetaminophen (TYLENOL) 500 MG tablet, Take 1,000 mg by mouth every 6 (six) hours as needed (for pain or headaches). , Disp: , Rfl:  .  albuterol (PROVENTIL HFA;VENTOLIN HFA) 108 (90 BASE) MCG/ACT inhaler, Inhale 1-2 puffs into the lungs every 6 (six) hours as needed for wheezing or shortness of breath. Reported on 10/03/2015, Disp: , Rfl:  .  amLODipine (NORVASC) 5 MG tablet, Take 5 mg by mouth daily., Disp: , Rfl:  .  apixaban (ELIQUIS) 5 MG TABS tablet, Take 1 tablet (5 mg total) 2 (two) times daily by mouth., Disp: 60 tablet, Rfl: 0 .  baclofen (LIORESAL) 10 MG tablet, Take 10 mg by mouth 2 (two) times daily., Disp: , Rfl:  .  budesonide-formoterol (SYMBICORT) 160-4.5 MCG/ACT inhaler, Inhale 2 puffs into the lungs 2 (two) times daily., Disp: 1 Inhaler, Rfl: 0 .  diphenhydrAMINE (BENADRYL) 25 MG tablet, Take 25 mg by mouth every 6 (six) hours as needed for itching or allergies. , Disp: , Rfl:  .  docusate sodium (COLACE) 100 MG capsule, Take 1 capsule (100 mg total) by mouth 2 (two) times daily. (Patient taking differently: Take 100 mg by mouth daily as needed. ), Disp: 10 capsule, Rfl: 0 .  famotidine (PEPCID) 20 MG tablet, TAKE (1) TABLET BY MOUTH AT BEDTIME. (Patient taking differently: Take 20 mg by mouth at bedtime. TAKE (1) TABLET BY MOUTH AT BEDTIME.), Disp: 30 tablet, Rfl: 0 .  guaiFENesin (MUCINEX) 600 MG 12 hr tablet,  Take 600 mg by mouth 2 (two) times daily as needed for cough or to loosen phlegm., Disp: , Rfl:  .  hydrOXYzine (ATARAX/VISTARIL) 25 MG tablet, Take 1 tablet (25 mg total) by mouth at bedtime., Disp: 30 tablet, Rfl: 0 .  ipratropium-albuterol (DUONEB) 0.5-2.5 (3) MG/3ML SOLN, Take 3 mLs by nebulization every 4 (four) hours as needed., Disp: 360 mL, Rfl: 0 .  levocetirizine (XYZAL) 5 MG tablet, Take 5 mg by mouth every evening., Disp: , Rfl:  .  metoprolol tartrate (LOPRESSOR) 25 MG tablet, Take 0.5 tablets (12.5 mg total) by mouth 2 (two) times daily., Disp: 30 tablet, Rfl: 3 .  montelukast (SINGULAIR) 10 MG tablet, TAKE (1) TABLET BY MOUTH AT BEDTIME. (Patient taking differently: Take 10 mg by mouth at bedtime. ), Disp: 30 tablet, Rfl: 1 .  pantoprazole (PROTONIX) 40 MG tablet, TAKE 1 TABLET BY MOUTH 30 TO 60 MINUTES PRIOR TO THE FIRST MEAL OF THE DAY. (Patient taking differently: Take 40 mg by mouth daily. ), Disp: 30 tablet, Rfl: 0 .  potassium chloride (KLOR-CON) 20 MEQ tablet, Take 1 tablet (20 mEq total) by mouth 2 (two) times daily., Disp: 60 tablet, Rfl: 1 .  predniSONE (DELTASONE) 20 MG tablet, Take 2 tablets (40 mg total) by mouth daily with breakfast., Disp: 10 tablet, Rfl: 0 .  Tamsulosin HCl (FLOMAX) 0.4 MG CAPS, Take 0.4 mg by mouth daily after supper. , Disp: , Rfl:  .  torsemide (  DEMADEX) 20 MG tablet, Take 2 tablets (40 mg total) by mouth daily., Disp: 60 tablet, Rfl: 2 .  vitamin B-12 (CYANOCOBALAMIN) 500 MCG tablet, Take 1,000 mcg by mouth in the morning and at bedtime. , Disp: , Rfl:   Past Medical History: Past Medical History:  Diagnosis Date  . AAA (abdominal aortic aneurysm) ([place])   . Anxiety    due to pain in leg  . Arrhythmia   . Arthritis    Osteo  . Bell's palsy   . Cancer [facility])    Bladder cancer  . Cataracts, bilateral   . CHF (congestive heart failure) ([place])   . COPD (chronic obstructive pulmonary disease) ([facility])    Albuterol inhaler as needed  .  Diverticulitis   . Enlarged prostate    takes Flomax daily  . GERD (gastroesophageal reflux disease)    was on Prilosec but has been off for yrs-no issues now with reflux  . Headache    migraines years ago  . History of bronchitis    > 49yrs ago  . History of colon polyps    benign  . History of dizziness    several yrs ago-was on Meclizine but has been off for several yrs-no problems since  . History of hiatal hernia   . Hypertension    takes Amlodipine and Lisinpril daily  . Joint pain   . Joint swelling   . Nocturia   . PAD (peripheral artery disease) ([facility])     bilateral superficial femoral artery occlusions   . Paroxysmal atrial fibrillation ([facility])   . Pneumonia    several times with most recent within 5 yrs ago  . Restless leg syndrome   . Rupture of left quadriceps tendon   . Shortness of breath dyspnea    with exertion  . Sleep apnea    does not use cpap  . Urinary urgency     Tobacco Use: Social History   Tobacco Use  Smoking Status Current Some Day Smoker  . Packs/day: 0.25  . Years: 45.00  . Pack years: 11.25  . Types: Cigars  . Last attempt to quit: 01/07/2019  . Years since quitting: 1.3  Smokeless Tobacco Never Used  Tobacco Comment   smokes 1/3 of a cigar a day- 12/27/2019    Labs: Recent Review Flowsheet Data    Labs for ITP Cardiac and Pulmonary Rehab Latest Ref Rng & Units 06/29/2008 08/02/2015 09/19/2015 03/11/2019   Cholestrol 0 - 200 mg/dL 153 .[name]. - - -   LDLCALC 0 - 99 mg/dL 99 ... - - -   HDL >39 mg/dL 29(L) - - -   Trlycerides <150 mg/dL 123 - - 163(H)   HCO3 20.0 - 24.0 mEq/L - - 26.8(H) -   TCO2 0 - 100 mmol/L - 24 28 -   O2SAT % - - 99.0 -      Capillary Blood Glucose: No results found for: GLUCAP   Pulmonary Assessment Scores:  Pulmonary Assessment Scores    Row Name 01/27/20 1418         ADL UCSD   ADL Phase Entry     SOB Score total 58           CAT Score   CAT Score 25           mMRC Score   mMRC Score 1            UCSD: Self-administered rating of dyspnea associated with activities of daily living (ADLs) 6-point  scale (0 = "not at all" to 5 = "maximal or unable to do because of breathlessness")  Scoring Scores range from 0 to 120.  Minimally important difference is 5 units  CAT: CAT can identify the health impairment of COPD patients and is better correlated with disease progression.  CAT has a scoring range of zero to 40. The CAT score is classified into four groups of low (less than 10), medium (10 - 20), high (21-30) and very high (31-40) based on the impact level of disease on health status. A CAT score over 10 suggests significant symptoms.  A worsening CAT score could be explained by an exacerbation, poor medication adherence, poor inhaler technique, or progression of COPD or comorbid conditions.  CAT MCID is 2 points  mMRC: mMRC (Modified Medical Research Council) Dyspnea Scale is used to assess the degree of baseline functional disability in patients of respiratory disease due to dyspnea. No minimal important difference is established. A decrease in score of 1 point or greater is considered a positive change.   Pulmonary Function Assessment:  Pulmonary Function Assessment - 01/27/20 1255      Breath   Shortness of Breath Yes;Limiting activity           Exercise Target Goals: Exercise Program Goal: Individual exercise prescription set using results from initial 6 min walk test and THRR while considering  patient's activity barriers and safety.   Exercise Prescription Goal: Initial exercise prescription builds to 30-45 minutes a day of aerobic activity, 2-3 days per week.  Home exercise guidelines will be given to patient during program as part of exercise prescription that the participant will acknowledge.  Activity Barriers & Risk Stratification:  Activity Barriers & Cardiac Risk Stratification - 01/27/20 1251      Activity Barriers & Cardiac Risk Stratification   Activity  Barriers Arthritis;Back Problems;Joint Problems;Deconditioning;Muscular Weakness;Shortness of Breath;Assistive Device    Cardiac Risk Stratification High           6 Minute Walk:  6 Minute Walk    Row Name 01/27/20 1449         6 Minute Walk   Phase Initial     Distance 600 feet     Walk Time 6 minutes     # of Rest Breaks 1     MPH 1.14     METS 0.9     RPE 13     Perceived Dyspnea  15     VO2 Peak 3.16     Symptoms Yes (comment)     Comments 1 seated rest break for 2 min due to cramps in his quads. He rated the cramps a 7/10 for pain     Resting HR 57 bpm     Resting BP 108/80     Resting Oxygen Saturation  96 %     Exercise Oxygen Saturation  during 6 min walk 94 %     Max Ex. HR 89 bpm     Max Ex. BP 128/76     2 Minute Post BP 110/78           Interval HR   1 Minute HR 83     2 Minute HR 86     3 Minute HR 89     4 Minute HR 74     5 Minute HR 74     6 Minute HR 83     2 Minute Post HR 66     Interval Heart Rate? Yes  Interval Oxygen   Interval Oxygen? Yes     Baseline Oxygen Saturation % 96 %     1 Minute Oxygen Saturation % 94 %     1 Minute Liters of Oxygen 2 L     2 Minute Oxygen Saturation % 94 %     2 Minute Liters of Oxygen 2 L     3 Minute Oxygen Saturation % 95 %     3 Minute Liters of Oxygen 2 L     4 Minute Oxygen Saturation % 97 %     4 Minute Liters of Oxygen 2 L     5 Minute Oxygen Saturation % 98 %     5 Minute Liters of Oxygen 2 L     6 Minute Oxygen Saturation % 96 %     6 Minute Liters of Oxygen 2 L     2 Minute Post Oxygen Saturation % 97 %     2 Minute Post Liters of Oxygen 2 L            Oxygen Initial Assessment:  Oxygen Initial Assessment - 01/27/20 1418      Initial 6 min Walk   Oxygen Used Continuous    Liters per minute 2      Program Oxygen Prescription   Program Oxygen Prescription Continuous    Liters per minute 2      Intervention   Short Term Goals To learn and exhibit compliance with exercise,  home and travel O2 prescription;To learn and understand importance of monitoring SPO2 with pulse oximeter and demonstrate accurate use of the pulse oximeter.;To learn and demonstrate proper pursed lip breathing techniques or other breathing techniques.;To learn and demonstrate proper use of respiratory medications;To learn and understand importance of maintaining oxygen saturations>88%    Long  Term Goals Exhibits compliance with exercise, home and travel O2 prescription;Verbalizes importance of monitoring SPO2 with pulse oximeter and return demonstration;Maintenance of O2 saturations>88%;Exhibits proper breathing techniques, such as pursed lip breathing or other method taught during program session;Compliance with respiratory medication;Demonstrates proper use of MDI's           Oxygen Re-Evaluation:  Oxygen Re-Evaluation    Row Name 02/07/20 1534 03/06/20 1520 04/03/20 1533         Program Oxygen Prescription   Program Oxygen Prescription Continuous Continuous Continuous     Liters per minute 2 2 2            Home Oxygen   Home Oxygen Device - - E-Tanks;Home Concentrator     Sleep Oxygen Prescription - - Continuous     Liters per minute - - 2.5     Home Exercise Oxygen Prescription Continuous - Continuous     Liters per minute 2 - 2     Home Resting Oxygen Prescription Continuous - Continuous     Liters per minute 2 - 2     Compliance with Home Oxygen Use Yes - Yes           Goals/Expected Outcomes   Short Term Goals To learn and exhibit compliance with exercise, home and travel O2 prescription;To learn and understand importance of monitoring SPO2 with pulse oximeter and demonstrate accurate use of the pulse oximeter.;To learn and understand importance of maintaining oxygen saturations>88%;To learn and demonstrate proper pursed lip breathing techniques or other breathing techniques.;To learn and demonstrate proper use of respiratory medications To learn and exhibit compliance with  exercise, home and travel O2 prescription;To learn and understand importance of  monitoring SPO2 with pulse oximeter and demonstrate accurate use of the pulse oximeter.;To learn and understand importance of maintaining oxygen saturations>88%;To learn and demonstrate proper pursed lip breathing techniques or other breathing techniques.;To learn and demonstrate proper use of respiratory medications To learn and exhibit compliance with exercise, home and travel O2 prescription;To learn and understand importance of monitoring SPO2 with pulse oximeter and demonstrate accurate use of the pulse oximeter.;To learn and understand importance of maintaining oxygen saturations>88%;To learn and demonstrate proper pursed lip breathing techniques or other breathing techniques.;To learn and demonstrate proper use of respiratory medications     Long  Term Goals Demonstrates proper use of MDI's;Compliance with respiratory medication;Exhibits proper breathing techniques, such as pursed lip breathing or other method taught during program session;Maintenance of O2 saturations>88%;Verbalizes importance of monitoring SPO2 with pulse oximeter and return demonstration;Exhibits compliance with exercise, home and travel O2 prescription Exhibits compliance with exercise, home and travel O2 prescription;Verbalizes importance of monitoring SPO2 with pulse oximeter and return demonstration;Maintenance of O2 saturations>88%;Exhibits proper breathing techniques, such as pursed lip breathing or other method taught during program session;Compliance with respiratory medication;Demonstrates proper use of MDI's Exhibits compliance with exercise, home and travel O2 prescription;Verbalizes importance of monitoring SPO2 with pulse oximeter and return demonstration;Maintenance of O2 saturations>88%;Exhibits proper breathing techniques, such as pursed lip breathing or other method taught during program session;Compliance with respiratory  medication;Demonstrates proper use of MDI's     Goals/Expected Outcomes Compliance Compliance Compliance            Oxygen Discharge (Final Oxygen Re-Evaluation):  Oxygen Re-Evaluation - 04/03/20 1533      Program Oxygen Prescription   Program Oxygen Prescription Continuous    Liters per minute 2      Home Oxygen   Home Oxygen Device E-Tanks;Home Concentrator    Sleep Oxygen Prescription Continuous    Liters per minute 2.5    Home Exercise Oxygen Prescription Continuous    Liters per minute 2    Home Resting Oxygen Prescription Continuous    Liters per minute 2    Compliance with Home Oxygen Use Yes      Goals/Expected Outcomes   Short Term Goals To learn and exhibit compliance with exercise, home and travel O2 prescription;To learn and understand importance of monitoring SPO2 with pulse oximeter and demonstrate accurate use of the pulse oximeter.;To learn and understand importance of maintaining oxygen saturations>88%;To learn and demonstrate proper pursed lip breathing techniques or other breathing techniques.;To learn and demonstrate proper use of respiratory medications    Long  Term Goals Exhibits compliance with exercise, home and travel O2 prescription;Verbalizes importance of monitoring SPO2 with pulse oximeter and return demonstration;Maintenance of O2 saturations>88%;Exhibits proper breathing techniques, such as pursed lip breathing or other method taught during program session;Compliance with respiratory medication;Demonstrates proper use of MDI's    Goals/Expected Outcomes Compliance           Initial Exercise Prescription:  Initial Exercise Prescription - 01/27/20 1400      Date of Initial Exercise RX and Referring Provider   Date 01/27/20    Referring Provider Dr. Shearon Stalls     Expected Discharge Date 05/31/20      Oxygen   Oxygen Continuous    Liters 2      NuStep   Level 1    SPM 60    Minutes 17      Arm Ergometer   Level 1    RPM 60    Minutes 22  Prescription Details   Frequency (times per week) 2    Duration Progress to 30 minutes of continuous aerobic without signs/symptoms of physical distress      Intensity   THRR 40-80% of Max Heartrate 56-112    Ratings of Perceived Exertion 11-13    Perceived Dyspnea 0-4      Resistance Training   Training Prescription Yes    Weight 2 lbs    Reps 10-15           Perform Capillary Blood Glucose checks as needed.  Exercise Prescription Changes:  Exercise Prescription Changes    Row Name 01/31/20 1600 02/07/20 1400 02/21/20 1500 03/06/20 1500 03/08/20 1330     Response to Exercise   Blood Pressure (Admit) 128/78 140/72 110/68 140/82 122/60   Blood Pressure (Exercise) 148/80 148/68 128/70 152/84 120/60   Blood Pressure (Exit) 136/72 122/76 122/60 122/78 130/72   Heart Rate (Admit) 62 bpm 71 bpm 76 bpm 75 bpm 76 bpm   Heart Rate (Exercise) 75 bpm 83 bpm 80 bpm 81 bpm 80 bpm   Heart Rate (Exit) 71 bpm 86 bpm 84 bpm 81 bpm 82 bpm   Oxygen Saturation (Admit) 97 % 96 % 97 % 95 % 92 %   Oxygen Saturation (Exercise) 94 % 95 % 96 % 95 % 96 %   Oxygen Saturation (Exit) 97 % 95 % 96 % 97 % 96 %   Rating of Perceived Exertion (Exercise) 15 15 13 12 12    Perceived Dyspnea (Exercise) 15 15 13 12 12    Duration Continue with 30 min of aerobic exercise without signs/symptoms of physical distress. Continue with 30 min of aerobic exercise without signs/symptoms of physical distress. Continue with 30 min of aerobic exercise without signs/symptoms of physical distress. Continue with 30 min of aerobic exercise without signs/symptoms of physical distress. Continue with 30 min of aerobic exercise without signs/symptoms of physical distress.   Intensity THRR unchanged THRR unchanged THRR unchanged THRR unchanged THRR unchanged     Progression   Progression Continue to progress workloads to maintain intensity without signs/symptoms of physical distress. Continue to progress workloads to maintain  intensity without signs/symptoms of physical distress. Continue to progress workloads to maintain intensity without signs/symptoms of physical distress. Continue to progress workloads to maintain intensity without signs/symptoms of physical distress. Continue to progress workloads to maintain intensity without signs/symptoms of physical distress.     Resistance Training   Training Prescription Yes Yes Yes Yes Yes   Weight 2 lbs 2 lbs 2 lbs 2 lbs 2 lbs   Reps 10-15 10-15 10-15 10-15 10-15   Time 10 Minutes 10 Minutes 10 Minutes 10 Minutes 10 Minutes     Oxygen   Oxygen Continuous Continuous Continuous Continuous Continuous   Liters 2 2 2 2 2      NuStep   Level 1 1 1 1 1    SPM 78 74 70 86 83   Minutes 17 17 17 17 17    METs 1.7 1.4 1.4 2.1 1.8     Arm Ergometer   Level 1 1 1 1 1    RPM 49 41 45 49 40   Minutes 22 22 22 22 22    METs 1.5 1.3 1.5 1.5 1.2   Row Name 04/03/20 1500 04/17/20 1600 05/01/20 1600         Response to Exercise   Blood Pressure (Admit) 132/68 146/72 134/78     Blood Pressure (Exercise) 132/74 128/68 162/76     Blood Pressure (  Exit) 112/64 120/70 112/64     Heart Rate (Admit) 73 bpm 73 bpm 83 bpm     Heart Rate (Exercise) 81 bpm 81 bpm 76 bpm     Heart Rate (Exit) 80 bpm 74 bpm 76 bpm     Oxygen Saturation (Admit) 98 % 89 % 97 %     Oxygen Saturation (Exercise) 97 % 95 % 97 %     Oxygen Saturation (Exit) 98 % 97 % 98 %     Rating of Perceived Exertion (Exercise) 12 13 12      Perceived Dyspnea (Exercise) 12 15 12      Duration Continue with 30 min of aerobic exercise without signs/symptoms of physical distress. Continue with 30 min of aerobic exercise without signs/symptoms of physical distress. Continue with 30 min of aerobic exercise without signs/symptoms of physical distress.     Intensity THRR unchanged THRR unchanged THRR unchanged           Progression   Progression Continue to progress workloads to maintain intensity without signs/symptoms of physical  distress. Continue to progress workloads to maintain intensity without signs/symptoms of physical distress. Continue to progress workloads to maintain intensity without signs/symptoms of physical distress.           Resistance Training   Training Prescription Yes Yes Yes     Weight 3 lbs 3 lbs 3 lbs     Reps 10-15 10-15 10-15     Time 10 Minutes 10 Minutes 10 Minutes           Oxygen   Oxygen Continuous Continuous Continuous     Liters 2 2 2            NuStep   Level 1 1 1      SPM 79 73 81     Minutes 17 17 17      METs 1.8 1.7 1.8           Arm Ergometer   Level 1 1 1      RPM 45 46 48     Minutes 22 22 22      METs 1.5 1.5 1.6           Home Exercise Plan   Plans to continue exercise at - - Home (comment)     Frequency - - Add 2 additional days to program exercise sessions.     Initial Home Exercises Provided - - 05/01/20            Exercise Comments:  Exercise Comments    Row Name 01/31/20 1604 05/01/20 1621         Exercise Comments Pt completed his first exercise session in pulmonary rehab today. He is severely deconditioned. He complained of shoulder pain during the warm up and was unable to do a portion of the hand weight activities. He complained of cramps in his quads while on the stepper. He was otherwise able to tolerate the exercise session. Reviewed home exercise with patient. Will start walking at home             Exercise Goals and Review:  Exercise Goals    Row Name 01/27/20 1452 02/07/20 1535 03/06/20 1514 04/03/20 1530 05/01/20 1619     Exercise Goals   Increase Physical Activity Yes Yes Yes Yes Yes   Intervention Provide advice, education, support and counseling about physical activity/exercise needs.;Develop an individualized exercise prescription for aerobic and resistive training based on initial evaluation findings, risk stratification, comorbidities and participant's personal goals. Provide advice,  education, support and counseling about  physical activity/exercise needs.;Develop an individualized exercise prescription for aerobic and resistive training based on initial evaluation findings, risk stratification, comorbidities and participant's personal goals. Provide advice, education, support and counseling about physical activity/exercise needs.;Develop an individualized exercise prescription for aerobic and resistive training based on initial evaluation findings, risk stratification, comorbidities and participant's personal goals. Provide advice, education, support and counseling about physical activity/exercise needs.;Develop an individualized exercise prescription for aerobic and resistive training based on initial evaluation findings, risk stratification, comorbidities and participant's personal goals. Provide advice, education, support and counseling about physical activity/exercise needs.;Develop an individualized exercise prescription for aerobic and resistive training based on initial evaluation findings, risk stratification, comorbidities and participant's personal goals.   Expected Outcomes Short Term: Attend rehab on a regular basis to increase amount of physical activity.;Long Term: Add in home exercise to make exercise part of routine and to increase amount of physical activity.;Long Term: Exercising regularly at least 3-5 days a week. Short Term: Attend rehab on a regular basis to increase amount of physical activity.;Long Term: Add in home exercise to make exercise part of routine and to increase amount of physical activity.;Long Term: Exercising regularly at least 3-5 days a week. Short Term: Attend rehab on a regular basis to increase amount of physical activity.;Long Term: Add in home exercise to make exercise part of routine and to increase amount of physical activity.;Long Term: Exercising regularly at least 3-5 days a week. Short Term: Attend rehab on a regular basis to increase amount of physical activity.;Long Term: Add in  home exercise to make exercise part of routine and to increase amount of physical activity.;Long Term: Exercising regularly at least 3-5 days a week. Short Term: Attend rehab on a regular basis to increase amount of physical activity.;Long Term: Add in home exercise to make exercise part of routine and to increase amount of physical activity.;Long Term: Exercising regularly at least 3-5 days a week.   Increase Strength and Stamina Yes Yes Yes Yes Yes   Intervention Provide advice, education, support and counseling about physical activity/exercise needs.;Develop an individualized exercise prescription for aerobic and resistive training based on initial evaluation findings, risk stratification, comorbidities and participant's personal goals. Provide advice, education, support and counseling about physical activity/exercise needs.;Develop an individualized exercise prescription for aerobic and resistive training based on initial evaluation findings, risk stratification, comorbidities and participant's personal goals. Provide advice, education, support and counseling about physical activity/exercise needs.;Develop an individualized exercise prescription for aerobic and resistive training based on initial evaluation findings, risk stratification, comorbidities and participant's personal goals. Provide advice, education, support and counseling about physical activity/exercise needs.;Develop an individualized exercise prescription for aerobic and resistive training based on initial evaluation findings, risk stratification, comorbidities and participant's personal goals. Provide advice, education, support and counseling about physical activity/exercise needs.;Develop an individualized exercise prescription for aerobic and resistive training based on initial evaluation findings, risk stratification, comorbidities and participant's personal goals.   Expected Outcomes Short Term: Increase workloads from initial exercise  prescription for resistance, speed, and METs.;Short Term: Perform resistance training exercises routinely during rehab and add in resistance training at home;Long Term: Improve cardiorespiratory fitness, muscular endurance and strength as measured by increased METs and functional capacity (6MWT) Short Term: Increase workloads from initial exercise prescription for resistance, speed, and METs.;Short Term: Perform resistance training exercises routinely during rehab and add in resistance training at home;Long Term: Improve cardiorespiratory fitness, muscular endurance and strength as measured by increased METs and functional capacity (6MWT) Short Term: Increase  workloads from initial exercise prescription for resistance, speed, and METs.;Short Term: Perform resistance training exercises routinely during rehab and add in resistance training at home;Long Term: Improve cardiorespiratory fitness, muscular endurance and strength as measured by increased METs and functional capacity (6MWT) Short Term: Increase workloads from initial exercise prescription for resistance, speed, and METs.;Short Term: Perform resistance training exercises routinely during rehab and add in resistance training at home;Long Term: Improve cardiorespiratory fitness, muscular endurance and strength as measured by increased METs and functional capacity (6MWT) Short Term: Increase workloads from initial exercise prescription for resistance, speed, and METs.;Short Term: Perform resistance training exercises routinely during rehab and add in resistance training at home;Long Term: Improve cardiorespiratory fitness, muscular endurance and strength as measured by increased METs and functional capacity (6MWT)   Able to understand and use rate of perceived exertion (RPE) scale Yes Yes Yes Yes Yes   Intervention Provide education and explanation on how to use RPE scale Provide education and explanation on how to use RPE scale Provide education and  explanation on how to use RPE scale Provide education and explanation on how to use RPE scale Provide education and explanation on how to use RPE scale   Expected Outcomes Short Term: Able to use RPE daily in rehab to express subjective intensity level;Long Term:  Able to use RPE to guide intensity level when exercising independently Short Term: Able to use RPE daily in rehab to express subjective intensity level;Long Term:  Able to use RPE to guide intensity level when exercising independently Short Term: Able to use RPE daily in rehab to express subjective intensity level;Long Term:  Able to use RPE to guide intensity level when exercising independently Short Term: Able to use RPE daily in rehab to express subjective intensity level;Long Term:  Able to use RPE to guide intensity level when exercising independently Short Term: Able to use RPE daily in rehab to express subjective intensity level;Long Term:  Able to use RPE to guide intensity level when exercising independently   Able to understand and use Dyspnea scale Yes Yes Yes Yes Yes   Intervention Provide education and explanation on how to use Dyspnea scale Provide education and explanation on how to use Dyspnea scale Provide education and explanation on how to use Dyspnea scale Provide education and explanation on how to use Dyspnea scale Provide education and explanation on how to use Dyspnea scale   Expected Outcomes Short Term: Able to use Dyspnea scale daily in rehab to express subjective sense of shortness of breath during exertion;Long Term: Able to use Dyspnea scale to guide intensity level when exercising independently Short Term: Able to use Dyspnea scale daily in rehab to express subjective sense of shortness of breath during exertion;Long Term: Able to use Dyspnea scale to guide intensity level when exercising independently Long Term: Able to use Dyspnea scale to guide intensity level when exercising independently;Short Term: Able to use  Dyspnea scale daily in rehab to express subjective sense of shortness of breath during exertion Long Term: Able to use Dyspnea scale to guide intensity level when exercising independently;Short Term: Able to use Dyspnea scale daily in rehab to express subjective sense of shortness of breath during exertion Long Term: Able to use Dyspnea scale to guide intensity level when exercising independently;Short Term: Able to use Dyspnea scale daily in rehab to express subjective sense of shortness of breath during exertion   Knowledge and understanding of Target Heart Rate Range (THRR) Yes Yes Yes Yes Yes   Intervention Provide  education and explanation of THRR including how the numbers were predicted and where they are located for reference Provide education and explanation of THRR including how the numbers were predicted and where they are located for reference Provide education and explanation of THRR including how the numbers were predicted and where they are located for reference Provide education and explanation of THRR including how the numbers were predicted and where they are located for reference Provide education and explanation of THRR including how the numbers were predicted and where they are located for reference   Expected Outcomes Short Term: Able to state/look up THRR;Long Term: Able to use THRR to govern intensity when exercising independently;Short Term: Able to use daily as guideline for intensity in rehab Short Term: Able to state/look up THRR;Long Term: Able to use THRR to govern intensity when exercising independently;Short Term: Able to use daily as guideline for intensity in rehab Short Term: Able to state/look up THRR;Long Term: Able to use THRR to govern intensity when exercising independently;Short Term: Able to use daily as guideline for intensity in rehab Short Term: Able to state/look up THRR;Long Term: Able to use THRR to govern intensity when exercising independently;Short Term: Able to  use daily as guideline for intensity in rehab Short Term: Able to state/look up THRR;Long Term: Able to use THRR to govern intensity when exercising independently;Short Term: Able to use daily as guideline for intensity in rehab   Understanding of Exercise Prescription Yes Yes Yes Yes Yes   Intervention Provide education, explanation, and written materials on patient's individual exercise prescription Provide education, explanation, and written materials on patient's individual exercise prescription Provide education, explanation, and written materials on patient's individual exercise prescription Provide education, explanation, and written materials on patient's individual exercise prescription Provide education, explanation, and written materials on patient's individual exercise prescription   Expected Outcomes Short Term: Able to explain program exercise prescription;Long Term: Able to explain home exercise prescription to exercise independently Short Term: Able to explain program exercise prescription;Long Term: Able to explain home exercise prescription to exercise independently Short Term: Able to explain program exercise prescription;Long Term: Able to explain home exercise prescription to exercise independently Short Term: Able to explain program exercise prescription;Long Term: Able to explain home exercise prescription to exercise independently Short Term: Able to explain program exercise prescription;Long Term: Able to explain home exercise prescription to exercise independently          Exercise Goals Re-Evaluation :  Exercise Goals Re-Evaluation    Row Name 02/07/20 1535 03/06/20 1514 04/03/20 1530 05/01/20 1620       Exercise Goal Re-Evaluation   Exercise Goals Review Increase Physical Activity;Increase Strength and Stamina;Able to understand and use rate of perceived exertion (RPE) scale;Able to understand and use Dyspnea scale;Knowledge and understanding of Target Heart Rate Range  (THRR);Understanding of Exercise Prescription Increase Physical Activity;Increase Strength and Stamina;Able to understand and use rate of perceived exertion (RPE) scale;Able to understand and use Dyspnea scale;Knowledge and understanding of Target Heart Rate Range (THRR);Understanding of Exercise Prescription Increase Physical Activity;Increase Strength and Stamina;Able to understand and use rate of perceived exertion (RPE) scale;Able to understand and use Dyspnea scale;Knowledge and understanding of Target Heart Rate Range (THRR);Understanding of Exercise Prescription Increase Physical Activity;Increase Strength and Stamina;Able to understand and use rate of perceived exertion (RPE) scale;Able to understand and use Dyspnea scale;Knowledge and understanding of Target Heart Rate Range (THRR);Understanding of Exercise Prescription    Comments Pt has completed 3 exercise sessions. Pt has been complaining of pain  in his left quad. He was wheezing today and did not bring his rescue inhaler. We encouraged him to bring his rescue inhaler to rehab. He also complains of left shoulder pain. He is exercising at 1.4 METs on the NuStep. Will continue to monitor and progress exercise as able. Patient has completed 9 exercise sessions. He has tolerated exercise well. He still complains of pain in his legs. He is progressing exercise well also. He is improving on his spm and he is determined when he comes into rehab to get as far as he can, distance wise, on the machines. He is currently exercising at 2.1 METs on the NuStep. Will continue to monitor and progress exercise as able. Pt has completed 13 exercise sessions. He missed several visits this month due to illness and regressed in terms of his conditioning. His work of breathing remains elevated and at times can be concerning. He currently exercises at 1.8 METs on the stepper. Will continue to monitor and progress as able. Patient has completed 17 exercise sessions. He is  progressing his exercise slowly. He continues to be short of breath and his breathing remains elevated. He is currently exercising at 1.8 METs on the NuStep. Will continue to monitor and progress as able.    Expected Outcomes Through exercising at rehab and at home patient will reach their goals. Through exercise at rehab and with a home exercise program, patient will achieve their goals. Through exercise at rehab and with a home exercise program, patient will achieve their goals. Through exercise at rehab and with a home exercise program, patient will achieve their goals.           Discharge Exercise Prescription (Final Exercise Prescription Changes):  Exercise Prescription Changes - 05/01/20 1600      Response to Exercise   Blood Pressure (Admit) 134/78    Blood Pressure (Exercise) 162/76    Blood Pressure (Exit) 112/64    Heart Rate (Admit) 83 bpm    Heart Rate (Exercise) 76 bpm    Heart Rate (Exit) 76 bpm    Oxygen Saturation (Admit) 97 %    Oxygen Saturation (Exercise) 97 %    Oxygen Saturation (Exit) 98 %    Rating of Perceived Exertion (Exercise) 12    Perceived Dyspnea (Exercise) 12    Duration Continue with 30 min of aerobic exercise without signs/symptoms of physical distress.    Intensity THRR unchanged      Progression   Progression Continue to progress workloads to maintain intensity without signs/symptoms of physical distress.      Resistance Training   Training Prescription Yes    Weight 3 lbs    Reps 10-15    Time 10 Minutes      Oxygen   Oxygen Continuous    Liters 2      NuStep   Level 1    SPM 81    Minutes 17    METs 1.8      Arm Ergometer   Level 1    RPM 48    Minutes 22    METs 1.6      Home Exercise Plan   Plans to continue exercise at Home (comment)    Frequency Add 2 additional days to program exercise sessions.    Initial Home Exercises Provided 05/01/20           Nutrition:  Target Goals: Understanding of nutrition guidelines,  daily intake of sodium 1500mg , cholesterol 200mg , calories 30% from fat and 7%  or less from saturated fats, daily to have 5 or more servings of fruits and vegetables.  Biometrics:  Pre Biometrics - 04/17/20 1619      Pre Biometrics   Weight 103.1 kg            Nutrition Therapy Plan and Nutrition Goals:  Nutrition Therapy & Goals - 04/20/20 1334      Personal Nutrition Goals   Comments We will continue to provide heart healthy nutritional education through hand-outs.      Intervention Plan   Intervention Nutrition handout(s) given to patient.           Nutrition Assessments:  Nutrition Assessments - 01/27/20 1418      MEDFICTS Scores   Pre Score 21          MEDIFICTS Score Key:  ?70 Need to make dietary changes   40-70 Heart Healthy Diet  ? 40 Therapeutic Level Cholesterol Diet   Picture Your Plate Scores:  <16 Unhealthy dietary pattern with much room for improvement.  41-50 Dietary pattern unlikely to meet recommendations for good health and room for improvement.  51-60 More healthful dietary pattern, with some room for improvement.   >60 Healthy dietary pattern, although there may be some specific behaviors that could be improved.    Nutrition Goals Re-Evaluation:   Nutrition Goals Discharge (Final Nutrition Goals Re-Evaluation):   Psychosocial: Target Goals: Acknowledge presence or absence of significant depression and/or stress, maximize coping skills, provide positive support system. Participant is able to verbalize types and ability to use techniques and skills needed for reducing stress and depression.  Initial Review & Psychosocial Screening:  Initial Psych Review & Screening - 01/27/20 1256      Initial Review   Current issues with None Identified      Family Dynamics   Good Support System? Yes    Comments His wife is his support system. His dog just passed away who was also part of his support system. His daughter also supports him  when she can. She is busy with her two children.      Barriers   Psychosocial barriers to participate in program There are no identifiable barriers or psychosocial needs.      Screening Interventions   Interventions Encouraged to exercise;Other (comment)           Quality of Life Scores:  Quality of Life - 01/27/20 1449      Quality of Life   Select Quality of Life      Quality of Life Scores   Health/Function Pre 20.43 %    Socioeconomic Pre 25.75 %    Psych/Spiritual Pre 26.36 %    Family Pre 27.6 %    GLOBAL Pre 23.74 %          Scores of 19 and below usually indicate a poorer quality of life in these areas.  A difference of  2-3 points is a clinically meaningful difference.  A difference of 2-3 points in the total score of the Quality of Life Index has been associated with significant improvement in overall quality of life, self-image, physical symptoms, and general health in studies assessing change in quality of life.   PHQ-9: Recent Review Flowsheet Data    Depression screen [facility] 2/9 01/27/2020 05/07/2017   Decreased Interest 0 0   Down, Depressed, Hopeless 0 0   PHQ - 2 Score 0 0   Altered sleeping 0 -   Tired, decreased energy 1 -   Change in  appetite 0 -   Feeling bad or failure about yourself  0 -   Trouble concentrating 0 -   Moving slowly or fidgety/restless 0 -   Suicidal thoughts 0 -   PHQ-9 Score 1 -   Difficult doing work/chores Somewhat difficult -     Interpretation of Total Score  Total Score Depression Severity:  1-4 = Minimal depression, 5-9 = Mild depression, 10-14 = Moderate depression, 15-19 = Moderately severe depression, 20-27 = Severe depression   Psychosocial Evaluation and Intervention:   Psychosocial Re-Evaluation:  Psychosocial Re-Evaluation    [place] Name 02/06/20 1327 03/05/20 0858 03/30/20 1258 04/20/20 1312       Psychosocial Re-Evaluation   Current issues with None Identified None Identified None Identified None Identified     Comments Patient has no psychosocial issues identified. His initial QOL score was 23.74 and his PHQ-9 score was 1. He has a positive attitude and works hard duing his sessions. Will continue to monitor. Patient has no psychosocial issues identified. His initial QOL score was 23.74 and his PHQ-9 score was 1. He continues to demonstrate a positive outlook and works hard duing his sessions. Will continue to monitor. Patient has no psychosocial issues identified. He has completed 12 sessions with relatively consistent attendance. He has completed 12 sessions and continues to demonstrate a positive outlook and works hard duing his sessions. Will continue to monitor. Patient has no psychosocial issues identified. He has only completed 3 sessions since the last review with poor attendance. He continues to demonstrate a positive outlook and works hard duing his sessions.  Althought  O2 sats are in the 90"s he has alot of SOB, O2 at 3L.[name] RPE is #15  during last class he attended. Will continue to monitor.    Expected Outcomes Patient will have no psychosocial issues identified at discharge. Patient will have no psychosocial issues identified at discharge. Patient will have no psychosocial issues identified at discharge. Patient will have no psychosocial issues identified at discharge.    Interventions Encouraged to attend Pulmonary Rehabilitation for the exercise;Relaxation education;Stress management education Encouraged to attend Pulmonary Rehabilitation for the exercise;Relaxation education;Stress management education Encouraged to attend Pulmonary Rehabilitation for the exercise;Relaxation education;Stress management education Encouraged to attend Pulmonary Rehabilitation for the exercise;Relaxation education;Stress management education    Continue Psychosocial Services  No Follow up required No Follow up required No Follow up required No Follow up required           Psychosocial Discharge (Final Psychosocial  Re-Evaluation):  Psychosocial Re-Evaluation - 04/20/20 1312      Psychosocial Re-Evaluation   Current issues with None Identified    Comments Patient has no psychosocial issues identified. He has only completed 3 sessions since the last review with poor attendance. He continues to demonstrate a positive outlook and works hard duing his sessions.  Althought  O2 sats are in the 90"s he has alot of SOB, O2 at 3L.[name] RPE is #15  during last class he attended. Will continue to monitor.    Expected Outcomes Patient will have no psychosocial issues identified at discharge.    Interventions Encouraged to attend Pulmonary Rehabilitation for the exercise;Relaxation education;Stress management education    Continue Psychosocial Services  No Follow up required            Education: Education Goals: Education classes will be provided on a weekly basis, covering required topics. Participant will state understanding/return demonstration of topics presented.  Learning Barriers/Preferences:   Education Topics: How Lungs Work  and Diseases: - Discuss the anatomy of the lungs and diseases that can affect the lungs, such as COPD.   Exercise: -Discuss the importance of exercise, FITT principles of exercise, normal and abnormal responses to exercise, and how to exercise safely.   Environmental Irritants: -Discuss types of environmental irritants and how to limit exposure to environmental irritants. Flowsheet Row PULMONARY REHAB CHRONIC OBSTRUCTIVE PULMONARY DISEASE from 04/12/2020 in [place]  Date 04/05/20  Educator DF  Instruction Review Code 2- Demonstrated Understanding      Meds/Inhalers and oxygen: - Discuss respiratory medications, definition of an inhaler and oxygen, and the proper way to use an inhaler and oxygen. Flowsheet Row PULMONARY REHAB CHRONIC OBSTRUCTIVE PULMONARY DISEASE from 04/12/2020 in [place]  Date 04/12/20  Educator Handout       Energy Saving Techniques: - Discuss methods to conserve energy and decrease shortness of breath when performing activities of daily living.    Bronchial Hygiene / Breathing Techniques: - Discuss breathing mechanics, pursed-lip breathing technique,  proper posture, effective ways to clear airways, and other functional breathing techniques   Cleaning Equipment: - Provides group verbal and written instruction about the health risks of elevated stress, cause of high stress, and healthy ways to reduce stress. Flowsheet Row PULMONARY REHAB CHRONIC OBSTRUCTIVE PULMONARY DISEASE from 04/12/2020 in [place]  Date 02/02/20  Educator DJ  Instruction Review Code 1- Verbalizes Understanding      Nutrition I: Fats: - Discuss the types of cholesterol, what cholesterol does to the body, and how cholesterol levels can be controlled. Flowsheet Row PULMONARY REHAB CHRONIC OBSTRUCTIVE PULMONARY DISEASE from 04/12/2020 in [place]  Date 02/09/20  Educator Etheleen Mayhew  Instruction Review Code 1- Verbalizes Understanding      Nutrition II: Labels: -Discuss the different components of food labels and how to read food labels.   Respiratory Infections: - Discuss the signs and symptoms of respiratory infections, ways to prevent respiratory infections, and the importance of seeking medical treatment when having a respiratory infection. Flowsheet Row PULMONARY REHAB CHRONIC OBSTRUCTIVE PULMONARY DISEASE from 04/12/2020 in [place]  Date 02/23/20  Educator DF  Instruction Review Code 1- Verbalizes Understanding      Stress I: Signs and Symptoms: - Discuss the causes of stress, how stress may lead to anxiety and depression, and ways to limit stress. Flowsheet Row PULMONARY REHAB CHRONIC OBSTRUCTIVE PULMONARY DISEASE from 04/12/2020 in [place]  Date 03/01/20  Educator [facility]  Instruction Review Code 1-  Verbalizes Understanding      Stress II: Relaxation: -Discuss relaxation techniques to limit stress. Flowsheet Row PULMONARY REHAB CHRONIC OBSTRUCTIVE PULMONARY DISEASE from 04/12/2020 in [place]  Date 03/08/20  Educator [facility]  Instruction Review Code 2- Demonstrated Understanding      Oxygen for Home/Travel: - Discuss how to prepare for travel when on oxygen and proper ways to transport and store oxygen to ensure safety.   Knowledge Questionnaire Score:  Knowledge Questionnaire Score - 01/27/20 1419      Knowledge Questionnaire Score   Pre Score 16/18           Core Components/Risk Factors/Patient Goals at Admission:  Personal Goals and Risk Factors at Admission - 01/27/20 1419      Core Components/Risk Factors/Patient Goals on Admission   Improve shortness of breath with ADL's Yes    Intervention Provide education, individualized exercise plan and daily activity instruction to help decrease symptoms of  SOB with activities of daily living.    Expected Outcomes Short Term: Improve cardiorespiratory fitness to achieve a reduction of symptoms when performing ADLs;Long Term: Be able to perform more ADLs without symptoms or delay the onset of symptoms    Heart Failure Yes    Intervention Provide a combined exercise and nutrition program that is supplemented with education, support and counseling about heart failure. Directed toward relieving symptoms such as shortness of breath, decreased exercise tolerance, and extremity edema.    Expected Outcomes Improve functional capacity of life;Short term: Attendance in program 2-3 days a week with increased exercise capacity. Reported lower sodium intake. Reported increased fruit and vegetable intake. Reports medication compliance.;Short term: Daily weights obtained and reported for increase. Utilizing diuretic protocols set by physician.;Long term: Adoption of self-care skills and reduction of barriers for early signs  and symptoms recognition and intervention leading to self-care maintenance.           Core Components/Risk Factors/Patient Goals Review:   Goals and Risk Factor Review    Row Name 02/06/20 1328 03/05/20 0858 03/30/20 1259 04/20/20 1334       Core Components/Risk Factors/Patient Goals Review   Personal Goals Review - - - Weight Management/Obesity;Develop more efficient breathing techniques such as purse lipped breathing and diaphragmatic breathing and practicing self-pacing with activity.;Improve shortness of breath with ADL's    Review Patient is new to the program completing 2 sessions. 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 His personal goals are to return to his ADL's and returnto walking long distances. Will continue to monitor for progress. Patient has completed 8 sessions gaining 2 lbs since last 30 day review. He was referred to pulmonary rehab with COPD with chronic bronchitis and emphysema. He is doing the best he can in the sessions. He continues to exhibit labored breathing with severe stridor. He continues to smoke cigars occasionally. His personal goals are to return to his ADL's and retrun to walking long distance. Will continue to monitor for progress. Patient has completed 12 sessions gaining 2 lbs since his last 30 day review. He does work hard during sessions. He continues to exhibit labored breathing with severe stridor. He started prednisone back in December. He does miss some sessions due to SOB. He continue to smoke cigars occasionally. His personal goals for the program are to be able to walk long distance and to return to doing his ADL's. We will continue to monitor as he works toward meeting these goals. Patient has only completed 3 sessions since his last review and gaining another 1lbs since his last review.  He continues to exhibit labored breathing with severe stridor.  Review purse lip breathing with him. Oxygen at 2L and O2 Sats in upper 90"s.   He does miss  some sessions due to SOB. He continue to smoke cigars occasionally. His personal goals for the program are to be able to walk long distance and to return to doing his ADL's. We will continue to monitor as he works toward meeting these goals.    Expected Outcomes Patient will complete the program meeting both program and personal goals. Patient will complete the program meeting both program and personal goals. Patient will complete the program meeting both program and personal goals. Patient will complete the program meeting both program and personal goals.           Core Components/Risk Factors/Patient Goals at Discharge (Final Review):   Goals and Risk Factor Review - 04/20/20 1334  Core Components/Risk Factors/Patient Goals Review   Personal Goals Review Weight Management/Obesity;Develop more efficient breathing techniques such as purse lipped breathing and diaphragmatic breathing and practicing self-pacing with activity.;Improve shortness of breath with ADL's    Review Patient has only completed 3 sessions since his last review and gaining another 1lbs since his last review.  He continues to exhibit labored breathing with severe stridor.  Review purse lip breathing with him. Oxygen at 2L and O2 Sats in upper 90"s.   He does miss some sessions due to SOB. He continue to smoke cigars occasionally. His personal goals for the program are to be able to walk long distance and to return to doing his ADL's. We will continue to monitor as he works toward meeting these goals.    Expected Outcomes Patient will complete the program meeting both program and personal goals.           ITP Comments:   Comments: ITP REVIEW Pt is making expected progress toward pulmonary rehab goals after completing 18 sessions. Recommend continued exercise, life style modification, education, and utilization of breathing techniques to increase stamina and strength and decrease shortness of breath with exertion.

## 2020-05-03 NOTE — Progress Notes (Signed)
Daily Session Note  Patient Details  Name: Derrick Moon MRN: [id number] Date of Birth: May 09, 1940 Referring Provider:   Flowsheet Row PULMONARY REHAB COPD ORIENTATION from 01/27/2020 in [place]  Referring Provider Dr. Shearon Stalls       Encounter Date: 05/03/2020  Check In:  Session Check In - 05/03/20 1326      Check-In   Supervising physician immediately available to respond to emergencies [facility] MD immediately available    Physician(s) Dr. Harl Bowie    Location [facility]    Staff Present Geanie Cooley, RN;Dailey Alberson Wynetta Emery, RN, BSN;Madison Audria Nine, MS, Exercise Physiologist    Virtual Visit No    Medication changes reported     No    Fall or balance concerns reported    No    Tobacco Cessation No Change    Warm-up and Cool-down Performed as group-led instruction    Resistance Training Performed Yes    VAD Patient? No    PAD/SET Patient? No      Pain Assessment   Currently in Pain? No/denies    Pain Score 0-No pain    Multiple Pain Sites No           Capillary Blood Glucose: No results found for this or any previous visit (from the past 24 hour(s)).    Social History   Tobacco Use  Smoking Status Current Some Day Smoker  . Packs/day: 0.25  . Years: 45.00  . Pack years: 11.25  . Types: Cigars  . Last attempt to quit: 01/07/2019  . Years since quitting: 1.3  Smokeless Tobacco Never Used  Tobacco Comment   smokes 1/3 of a cigar a day- 12/27/2019    Goals Met:  Proper associated with RPD/PD & O2 Sat Independence with exercise equipment Improved SOB with ADL's Using PLB without cueing & demonstrates good technique Exercise tolerated well No report of cardiac concerns or symptoms Strength training completed today  Goals Unmet:  Not Applicable  Comments: Check out 1430.   Dr. Kathie Dike is Medical Director for [facility] Pulmonary Rehab.

## 2020-05-08 ENCOUNTER — Encounter (HOSPITAL_COMMUNITY): Payer: PPO

## 2020-05-09 ENCOUNTER — Ambulatory Visit: Payer: PPO | Admitting: Internal Medicine

## 2020-05-09 ENCOUNTER — Encounter: Payer: Self-pay | Admitting: Internal Medicine

## 2020-05-09 ENCOUNTER — Other Ambulatory Visit: Payer: Self-pay

## 2020-05-09 VITALS — BP 128/78 | HR 52 | Temp 97.8°F | Ht 71.0 in | Wt 221.4 lb

## 2020-05-09 DIAGNOSIS — I5032 Chronic diastolic (congestive) heart failure: Secondary | ICD-10-CM

## 2020-05-09 DIAGNOSIS — J9611 Chronic respiratory failure with hypoxia: Secondary | ICD-10-CM

## 2020-05-09 DIAGNOSIS — J418 Mixed simple and mucopurulent chronic bronchitis: Secondary | ICD-10-CM

## 2020-05-09 MED ORDER — ROFLUMILAST 500 MCG PO TABS: 500.0000 ug | ORAL_TABLET | Freq: Every day | ORAL | 11 refills | Status: DC

## 2020-05-09 MED ORDER — TRELEGY ELLIPTA 100-62.5-25 MCG/INH IN AEPB: 1.0000 | INHALATION_SPRAY | Freq: Every day | RESPIRATORY_TRACT | 5 refills | Status: DC

## 2020-05-09 NOTE — Addendum Note (Signed)
Addended by: Lenice Llamas on: 05/09/2020 04:52 PM   Modules accepted: Level of Service

## 2020-05-09 NOTE — Progress Notes (Addendum)
Derrick Moon    [id number]    11-Dec-1940  Primary Care Physician:Fagan, Carloyn Manner, MD Date of Appointment: 05/09/2020 Established Patient Visit  Chief complaint:   Chief Complaint  Patient presents with  . Follow-up    Follow up today for COPD--pt stated that he is doing ok.  Currently on 2 liters of oxgyen--will bump the oxygen up to 4 while taking  shower.     HPI: Derrick Moon is a 80 y.o. gentleman with CHF and COPD.   Interval Updates: Here for follow up. No interval hospitalizations,  But had a course of predisone in December. And two more in January. Doing pulmonary rehabilitation and is still on home oxygen 2LNC, sometimes needing up to [facility] with exertion. Wife notes he has gone downhill a lot over the past two years.  He has been especially less active over the last 10 years   Still on Albuterol, symbicort, singulair.  Gets most benefit from albuterol, taking it 3-4 times/day.  Sleeping in a recliner at night, because he cannot lay down flat due to difficulty breathing.   No lower extremity edema.   I have reviewed the patient's family social and past medical history and updated as appropriate.   Past Medical History:  Diagnosis Date  . AAA (abdominal aortic aneurysm) ([place])   . Anxiety    due to pain in leg  . Arrhythmia   . Arthritis    Osteo  . Bell's palsy   . Cancer [facility])    Bladder cancer  . Cataracts, bilateral   . CHF (congestive heart failure) ([place])   . COPD (chronic obstructive pulmonary disease) ([facility])    Albuterol inhaler as needed  . Diverticulitis   . Enlarged prostate    takes Flomax daily  . GERD (gastroesophageal reflux disease)    was on Prilosec but has been off for yrs-no issues now with reflux  . Headache    migraines years ago  . History of bronchitis    > 53yrs ago  . History of colon polyps    benign  . History of dizziness    several yrs ago-was on Meclizine but has been off for several yrs-no problems since  . History  of hiatal hernia   . Hypertension    takes Amlodipine and Lisinpril daily  . Joint pain   . Joint swelling   . Nocturia   . PAD (peripheral artery disease) ([facility])     bilateral superficial femoral artery occlusions   . Paroxysmal atrial fibrillation ([facility])   . Pneumonia    several times with most recent within 5 yrs ago  . Restless leg syndrome   . Rupture of left quadriceps tendon   . Shortness of breath dyspnea    with exertion  . Sleep apnea    does not use cpap  . Urinary urgency     Past Surgical History:  Procedure Laterality Date  . BICEPS TENDON REPAIR     LEFT  . CARDIOVERSION N/A 02/06/2017   Procedure: CARDIOVERSION;  Surgeon: Jerline Pain, MD;  Location: [facility] ENDOSCOPY;  Service: Cardiovascular;  Laterality: N/A;  . CARPAL TUNNEL RELEASE Right 2014  . CHOLECYSTECTOMY  July 16,2010  . COLONOSCOPY    . CYSTOSCOPY    . ESOPHAGOGASTRODUODENOSCOPY    . FEMORAL-POPLITEAL BYPASS GRAFT Right 09/19/2015   Procedure: RIGHT COMMON FEMORAL-ABOVE KNEE POPLITEAL ARTERY    BYPASS GRAFT ;  Surgeon: Conrad Longview, MD;  Location: [facility] OR;  Service: Vascular;  Laterality: Right;  . FOOT SURGERY Right    x 5t  . HAND SURGERY Right   . HERNIA REPAIR Bilateral    inguinal  . PERIPHERAL VASCULAR CATHETERIZATION N/A 08/02/2015   Procedure: Abdominal Aortogram w/Lower Extremity;  Surgeon: Conrad Shawnee, MD;  Location: [place] CV LAB;  Service: Cardiovascular;  Laterality: N/A;  . QUADRICEPS TENDON REPAIR Left 08/22/2016   Procedure: LEFT OPEN QUADRICEP TENDON REPAIR;  Surgeon: Netta Cedars, MD;  Location: [place];  Service: Orthopedics;  Laterality: Left;  . REVERSE SHOULDER ARTHROPLASTY Right 07/07/2014   Procedure: RIGHT REVERSE TOTAL SHOULDER ;  Surgeon: Netta Cedars, MD;  Location: [place];  Service: Orthopedics;  Laterality: Right;  . REVERSE TOTAL SHOULDER ARTHROPLASTY Right 07/07/2014   Dr Veverly Fells  . ROTATOR CUFF REPAIR     Bilateral   . TEE WITHOUT CARDIOVERSION N/A 02/06/2017    Procedure: TRANSESOPHAGEAL ECHOCARDIOGRAM (TEE);  Surgeon: Jerline Pain, MD;  Location: [facility] ENDOSCOPY;  Service: Cardiovascular;  Laterality: N/A;  . TONSILLECTOMY    . tumor removed from bladder    . VEIN HARVEST Right 09/19/2015   Procedure: WITH NON REVERSE RIGHT GREATER SAPHENOUS VEIN HARVEST;  Surgeon: Conrad Whitelaw, MD;  Location: [place];  Service: Vascular;  Laterality: Right;    Family History  Problem Relation Age of Onset  . Parkinson's disease Mother   . AAA (abdominal aortic aneurysm) Father   . Heart defect Sister        Congenital Heart Disease  . Heart disease Sister     Social History   Occupational History  . Not on file  Tobacco Use  . Smoking status: Current Some Day Smoker    Packs/day: 0.25    Years: 45.00    Pack years: 11.25    Types: Cigars    Last attempt to quit: 01/07/2019    Years since quitting: 1.3  . Smokeless tobacco: Never Used  . Tobacco comment: smokes 1/3 of a cigar a day- 12/27/2019  Vaping Use  . Vaping Use: Never used  Substance and Sexual Activity  . Alcohol use: Yes    Alcohol/week: 0.0 standard drinks    Comment: wine occasionally  . Drug use: Not Currently    Types: Marijuana  . Sexual activity: Yes    Physical Exam: Blood pressure 128/78, pulse (!) 52, temperature 97.8 F (36.6 C), temperature source Tympanic, height 5\' 11"  (1.803 m), weight 221 lb 6 oz (100.4 kg), SpO2 97 %.   Gen: chronically ill appearing, in wheel chair Resp: diminished bilaterally CV: diminished, RRR, no mrg Ext: minimal edema. Thin skin.    Data Reviewed: Imaging: I have personally reviewed the chest xray March 2021 reveals no acute cardiopulmonary process  Echocardiogram Dec 2020 1. Left ventricular ejection fraction, by visual estimation, is 60 to  65%. The left ventricle has normal function. There is borderline left  ventricular hypertrophy.  2. Left ventricular diastolic parameters are indeterminate.  3. The left ventricle has no  regional wall motion abnormalities.  4. Global right ventricle has normal systolic function.The right  ventricular size is normal. No increase in right ventricular wall  thickness.  5. Left atrial size was normal.  6. Right atrial size was normal.  7. Presence of pericardial fat pad.  8. Trivial pericardial effusion is present.  9. The mitral valve is normal in structure. Trivial mitral valve  regurgitation.  10. The tricuspid valve is normal in structure. Tricuspid valve  regurgitation is trivial.  11. The aortic valve is normal in structure. Aortic valve regurgitation is  not visualized. Mild aortic valve sclerosis without stenosis.  12. The pulmonic valve was not well visualized. Pulmonic valve  regurgitation is not visualized.  13. The atrial septum is grossly normal.   PFTs:  PFT Results Latest Ref Rng & Units 03/12/2018  FVC-Pre L 3.08  FVC-Predicted Pre % 72  FVC-Post L 3.66  FVC-Predicted Post % 85  Pre FEV1/FVC % % 59  Post FEV1/FCV % % 56  FEV1-Pre L 1.81  FEV1-Predicted Pre % 58  FEV1-Post L 2.05  DLCO uncorrected ml/min/mmHg 14.19  DLCO UNC% % 42  DLVA Predicted % 50  TLC L 7.95  TLC % Predicted % 109  RV % Predicted % 178   I have personally reviewed the patient's PFTs and my interpretation is moderately severe airflow limitation with borderline BD response consistent with COPD. FEV1 58% of predicted  Labs: A1AT within normal range  Immunization status: Immunization History  Administered Date(s) Administered  . Fluad Quad(high Dose 65+) 12/27/2019  . Influenza, High Dose Seasonal PF 12/12/2015, 01/08/2017, 12/22/2017, 02/22/2019  . Moderna Sars-Covid-2 Vaccination 05/06/2019, 06/03/2019  . Tdap 08/15/2016    Assessment:  Moderately Severe COPD FEV1 58% - not improved Chronic Hypoxemic Respiratory Failure on 2.5LNC - stable HFpEF  Plan/Recommendations: Frequent exacerbations requiring prednisone and antitbiotics. Will trial daliresp Continue  pulmonary rehabilitation at [facility] Stop symbicort. Try trelegy. We has trouble generating enough negative inspiratory effort to take inhalers.   Addendum: Pre-operative Pulmonary evaluation   ASSESSMENT AND RECOMMENDATIONS:  Preoperative Risk Calculation: The features of this patient's history that contribute to the pulmonary risk assessment include: Age, COPD, General anesthesia  This patient has an intermediate risk of post-operative pulmonary complications by ARISCAT Index.  The absolute assessment of risk/benefit of the procedure is deferred to the primary team's evaluation.  - Patient's Estimated risk of postoperative respiratory failure is 13.3%, 40 points based on the ARISCAT Index.   0 to 25 points: Low risk: 7.8% pulmonary complication rate  26 to 44 points: Intermediate risk: 93.8% pulmonary complication rate  45 to 123 points: High risk: 10.1% pulmonary complication rate  Postoperative respiratory failure (PRF) is considered as failure to wean from mechanical ventilation within 48 hours of surgery or unplanned intubation/reintubation postoperatively. The validated risk calculator provides a risk estimate of PRF and is anticipated to aid in surgical decision-making and informed patient consent.  However risk can be accepted given the potential benefit of this intervention and it is not prohibitive.  RECOMMENDATIONS:  In order to minimize the risk of complications and optimize pulmonary status, we recommend the following: - Encourage aggressive incentive spirometry hourly both peri-operatively and post-operatively as tolerated  - Early ambulation and physical therapy as tolerated post-operatively - Adequate pain control especially in the setting of abdominal and thoracic surgery - Bronchodilators as needed for wheezing or shortness of breath - Oral steroids only if the patient appears to have component of COPD exacerbation, otherwise no routine utilization needed - Ideally we  recommend smoking cessation for >4 weeks before any intervention - Intraoperatively keep OR time to the shortest as possible   ARISCAT: Mazo et al. Anesthesiology 2014; [phone]    Return to Care: Return in about 3 months (around 08/06/2020).     Lenice Llamas, MD Pulmonary and [place]

## 2020-05-09 NOTE — Patient Instructions (Addendum)
The patient should have follow up scheduled with myself in 3 months.   Start taking daliresp - a pill once a day for your COPD.   Stop symbicort. Start taking trelegy.

## 2020-05-10 ENCOUNTER — Encounter (HOSPITAL_COMMUNITY)
Admission: RE | Admit: 2020-05-10 | Discharge: 2020-05-10 | Disposition: A | Discharge status: Home / Self Care | Payer: PPO | Source: Ambulatory Visit | Attending: Internal Medicine | Admitting: Internal Medicine

## 2020-05-10 DIAGNOSIS — J449 Chronic obstructive pulmonary disease, unspecified: Secondary | ICD-10-CM | POA: Diagnosis not present

## 2020-05-10 NOTE — Progress Notes (Signed)
Daily Session Note  Patient Details  Name: Derrick Moon MRN: [id number] Date of Birth: [birth year] Referring Provider:   Flowsheet Row PULMONARY REHAB COPD ORIENTATION from 01/27/2020 in [place]  Referring Provider Dr. Shearon Stalls       Encounter Date: 05/10/2020  Check In:  Session Check In - 05/10/20 1330      Check-In   Supervising physician immediately available to respond to emergencies [facility] MD immediately available    Physician(s) Johnsie Cancel    Location [facility]    Staff Present Aundra Dubin, RN, BSN;Madison Audria Nine, MS, Exercise Physiologist;Dalton Kris Mouton, MS, ACSM-CEP, Exercise Physiologist    Virtual Visit No    Medication changes reported     Yes    Comments Saw pulmonalogist yesterday. He discontinued Symbicort and added Trelegy.    Fall or balance concerns reported    No    Tobacco Cessation No Change    Warm-up and Cool-down Performed as group-led instruction    Resistance Training Performed Yes    VAD Patient? No    PAD/SET Patient? No      Pain Assessment   Currently in Pain? No/denies    Pain Score 0-No pain    Multiple Pain Sites No           Capillary Blood Glucose: No results found for this or any previous visit (from the past 24 hour(s)).    Social History   Tobacco Use  Smoking Status Current Some Day Smoker  . Packs/day: 0.25  . Years: 45.00  . Pack years: 11.25  . Types: Cigars  . Last attempt to quit: 01/07/2019  . Years since quitting: 1.3  Smokeless Tobacco Never Used  Tobacco Comment   smokes 1/3 of a cigar a day- 12/27/2019    Goals Met:  Proper associated with RPD/PD & O2 Sat Independence with exercise equipment Improved SOB with ADL's Using PLB without cueing & demonstrates good technique Exercise tolerated well No report of cardiac concerns or symptoms Strength training completed today  Goals Unmet:  Not Applicable  Comments: Check out 1430.   Dr. Kathie Dike is Medical  Director for [facility] Pulmonary Rehab.

## 2020-05-15 ENCOUNTER — Encounter (HOSPITAL_COMMUNITY): Payer: PPO

## 2020-05-17 ENCOUNTER — Encounter (HOSPITAL_COMMUNITY): Payer: PPO

## 2020-05-21 ENCOUNTER — Telehealth: Payer: Self-pay | Admitting: Internal Medicine

## 2020-05-21 DIAGNOSIS — I10 Essential (primary) hypertension: Secondary | ICD-10-CM | POA: Diagnosis not present

## 2020-05-21 DIAGNOSIS — J449 Chronic obstructive pulmonary disease, unspecified: Secondary | ICD-10-CM | POA: Diagnosis not present

## 2020-05-21 NOTE — Telephone Encounter (Signed)
Called and spoke with pt's wife [name] who states they received a bill for $3,900 from [place]. [name] stated that she spoke with their insurance company about bill and it was due to [employer] not having any info on why pt needs to have O2 or nebulizer machine.  [name] states that [employer] is needing information explaining why pt needs to have the oxygen and nebulizer machine.  Dr. Shearon Stalls, please advise if you can do an addendum to your last OV pt had with you explaining why pt needs to have the O2 and also why pt needs the nebulizer machine so we can then get this faxed to [employer] to try to get the bill taken care of to where insurance will cover it.

## 2020-05-21 NOTE — Telephone Encounter (Signed)
The patient has COPd which is documented in my note. Was the prescription for nebulizer machine sent with a diagnosis of copd?

## 2020-05-21 NOTE — Telephone Encounter (Signed)
I do not see that we ever order a neb machine for him. Is the issue the o2 order only? LMTCB for Melissa with [employer] to see if she can help trouble shoot this.

## 2020-05-22 ENCOUNTER — Encounter (HOSPITAL_COMMUNITY): Payer: PPO

## 2020-05-23 NOTE — Telephone Encounter (Signed)
LMTCB with melissa with [employer].

## 2020-05-24 ENCOUNTER — Encounter (HOSPITAL_COMMUNITY): Payer: PPO

## 2020-05-25 NOTE — Telephone Encounter (Signed)
Sending community msg to Derrick Moon and brad Will await response

## 2020-05-28 ENCOUNTER — Encounter (HOSPITAL_COMMUNITY): Payer: Self-pay | Admitting: *Deleted

## 2020-05-28 ENCOUNTER — Other Ambulatory Visit: Payer: Self-pay

## 2020-05-28 ENCOUNTER — Emergency Department (HOSPITAL_COMMUNITY)
Admission: EM | Admit: 2020-05-28 | Discharge: 2020-05-28 | Disposition: A | Discharge status: Home / Self Care | Payer: PPO | Attending: Emergency Medicine | Admitting: Emergency Medicine

## 2020-05-28 ENCOUNTER — Emergency Department (HOSPITAL_COMMUNITY): Payer: PPO

## 2020-05-28 DIAGNOSIS — J441 Chronic obstructive pulmonary disease with (acute) exacerbation: Secondary | ICD-10-CM | POA: Insufficient documentation

## 2020-05-28 DIAGNOSIS — I480 Paroxysmal atrial fibrillation: Secondary | ICD-10-CM | POA: Diagnosis not present

## 2020-05-28 DIAGNOSIS — R1032 Left lower quadrant pain: Secondary | ICD-10-CM | POA: Diagnosis not present

## 2020-05-28 DIAGNOSIS — Z7901 Long term (current) use of anticoagulants: Secondary | ICD-10-CM | POA: Insufficient documentation

## 2020-05-28 DIAGNOSIS — Z96611 Presence of right artificial shoulder joint: Secondary | ICD-10-CM | POA: Diagnosis not present

## 2020-05-28 DIAGNOSIS — I110 Hypertensive heart disease with heart failure: Secondary | ICD-10-CM | POA: Diagnosis not present

## 2020-05-28 DIAGNOSIS — F17290 Nicotine dependence, other tobacco product, uncomplicated: Secondary | ICD-10-CM | POA: Diagnosis not present

## 2020-05-28 DIAGNOSIS — K219 Gastro-esophageal reflux disease without esophagitis: Secondary | ICD-10-CM | POA: Diagnosis not present

## 2020-05-28 DIAGNOSIS — K760 Fatty (change of) liver, not elsewhere classified: Secondary | ICD-10-CM | POA: Diagnosis not present

## 2020-05-28 DIAGNOSIS — Z79899 Other long term (current) drug therapy: Secondary | ICD-10-CM | POA: Diagnosis not present

## 2020-05-28 DIAGNOSIS — I714 Abdominal aortic aneurysm, without rupture: Secondary | ICD-10-CM | POA: Diagnosis not present

## 2020-05-28 DIAGNOSIS — I509 Heart failure, unspecified: Secondary | ICD-10-CM | POA: Insufficient documentation

## 2020-05-28 DIAGNOSIS — K5792 Diverticulitis of intestine, part unspecified, without perforation or abscess without bleeding: Secondary | ICD-10-CM | POA: Insufficient documentation

## 2020-05-28 DIAGNOSIS — Z8551 Personal history of malignant neoplasm of bladder: Secondary | ICD-10-CM | POA: Diagnosis not present

## 2020-05-28 LAB — CBC WITH DIFFERENTIAL/PLATELET
Abs Immature Granulocytes: 0.13 10*3/uL — ABNORMAL HIGH (ref 0.00–0.07)
Basophils Absolute: 0.1 10*3/uL (ref 0.0–0.1)
Basophils Relative: 1 %
Eosinophils Absolute: 0.1 10*3/uL (ref 0.0–0.5)
Eosinophils Relative: 1 %
HCT: 43.2 % (ref 39.0–52.0)
Hemoglobin: 13.2 g/dL (ref 13.0–17.0)
Immature Granulocytes: 1 %
Lymphocytes Relative: 27 %
Lymphs Abs: 2.6 10*3/uL (ref 0.7–4.0)
MCH: 26.7 pg (ref 26.0–34.0)
MCHC: 30.6 g/dL (ref 30.0–36.0)
MCV: 87.4 fL (ref 80.0–100.0)
Monocytes Absolute: 0.8 10*3/uL (ref 0.1–1.0)
Monocytes Relative: 8 %
Neutro Abs: 6.2 10*3/uL (ref 1.7–7.7)
Neutrophils Relative %: 62 %
Platelets: 411 10*3/uL — ABNORMAL HIGH (ref 150–400)
RBC: 4.94 MIL/uL (ref 4.22–5.81)
RDW: 16.1 % — ABNORMAL HIGH (ref 11.5–15.5)
WBC: 9.9 10*3/uL (ref 4.0–10.5)
nRBC: 0 % (ref 0.0–0.2)

## 2020-05-28 LAB — URINALYSIS, ROUTINE W REFLEX MICROSCOPIC
Bacteria, UA: NONE SEEN
Bilirubin Urine: NEGATIVE
Glucose, UA: NEGATIVE mg/dL
Ketones, ur: NEGATIVE mg/dL
Nitrite: NEGATIVE
Protein, ur: 30 mg/dL — AB
Specific Gravity, Urine: 1.02 (ref 1.005–1.030)
pH: 5 (ref 5.0–8.0)

## 2020-05-28 LAB — COMPREHENSIVE METABOLIC PANEL
ALT: 20 U/L (ref 0–44)
AST: 19 U/L (ref 15–41)
Albumin: 3.6 g/dL (ref 3.5–5.0)
Alkaline Phosphatase: 96 U/L (ref 38–126)
Anion gap: 11 (ref 5–15)
BUN: 9 mg/dL (ref 8–23)
CO2: 23 mmol/L (ref 22–32)
Calcium: 9.5 mg/dL (ref 8.9–10.3)
Chloride: 104 mmol/L (ref 98–111)
Creatinine, Ser: 1.15 mg/dL (ref 0.61–1.24)
GFR, Estimated: >60 mL/min (ref >60)
Glucose, Bld: 119 mg/dL — ABNORMAL HIGH (ref 70–99)
Potassium: 4.1 mmol/L (ref 3.5–5.1)
Sodium: 138 mmol/L (ref 135–145)
Total Bilirubin: 0.3 mg/dL (ref 0.3–1.2)
Total Protein: 7 g/dL (ref 6.5–8.1)

## 2020-05-28 LAB — LACTIC ACID, PLASMA: Lactic Acid, Venous: 1.6 mmol/L (ref 0.5–1.9)

## 2020-05-28 LAB — LIPASE, BLOOD: Lipase: 27 U/L (ref 11–51)

## 2020-05-28 MED ORDER — SODIUM CHLORIDE 0.9 % IV BOLUS
500.0000 mL | Freq: Once | INTRAVENOUS | Status: DC
Start: 2020-05-28 — End: 2020-05-28

## 2020-05-28 MED ORDER — IOHEXOL 300 MG/ML  SOLN
100.0000 mL | Freq: Once | INTRAMUSCULAR | Status: AC | PRN
  Administered 2020-05-28: 100 mL via INTRAVENOUS

## 2020-05-28 MED ORDER — ONDANSETRON HCL 4 MG/2ML IJ SOLN
4.0000 mg | Freq: Once | INTRAMUSCULAR | Status: AC
  Administered 2020-05-28: 4 mg via INTRAVENOUS
  Filled 2020-05-28: qty 2

## 2020-05-28 MED ORDER — AMOXICILLIN-POT CLAVULANATE 875-125 MG PO TABS
1.0000 | ORAL_TABLET | Freq: Once | ORAL | Status: AC
  Administered 2020-05-28: 1 via ORAL
  Filled 2020-05-28: qty 1

## 2020-05-28 MED ORDER — FENTANYL CITRATE (PF) 100 MCG/2ML IJ SOLN
50.0000 ug | Freq: Once | INTRAMUSCULAR | Status: AC
  Administered 2020-05-28: 50 ug via INTRAVENOUS
  Filled 2020-05-28: qty 2

## 2020-05-28 MED ORDER — AMOXICILLIN-POT CLAVULANATE 875-125 MG PO TABS: 1.0000 | ORAL_TABLET | Freq: Two times a day (BID) | ORAL | 0 refills | Status: DC

## 2020-05-28 NOTE — ED Triage Notes (Signed)
Multiple complaints. Pain in left lower quadrant for a week

## 2020-05-28 NOTE — Discharge Instructions (Addendum)
Your scan shows diverticulitis today.  Please treat with antibiotics, take twice daily for 7 days it is important that you complete entire course of antibiotics even if your symptoms have improved or resolved.  To treat pain you may use Tylenol.  Make sure you are drinking plenty of fluids.    If you develop worsening abdominal pain, fevers or persistent vomiting please return to the emergency department, otherwise follow-up with your GI doctor and primary care provider.  Your AAA has increased some in size, please call to schedule follow-up appointment with vascular surgery for continued monitoring of this.

## 2020-05-28 NOTE — ED Provider Notes (Signed)
[name] Note   CSN: [id number] Arrival date & time: 05/28/20  1313     History Chief Complaint  Patient presents with  . Abdominal Pain    Derrick Moon is a 80 y.o. male.  Derrick Moon is a 80 y.o. male with history of AAA, diverticulitis, GERD, CHF, COPD, HTN, paroxysmal Afib, and PAD, who presents for evaluation of abdominal pain.  Patient reports that over the past week he has been experiencing left lower quadrant abdominal pain.  He reports initially this would come and go but now it is a more constant dull ache.  He denies any associated fevers or chills, no nausea or vomiting.  But does report that he has noticed that his stools are very thin and have changed shape, he has not seen any blood or melena in stools.  Reports he feels like he needs to have a larger bowel movement but no matter what he only passes thin flat stools and very small amounts.  He states that this change in his stool has been present over about a month but he has noticed it worsening in the past week.  Reports he was supposed to have a colonoscopy about 2 years ago but this was canceled due to concerns with anesthesia.  He has not had any fevers or chills.  Does have a history of diverticulitis reports this usually causes pain on the right side of his abdomen.  Denies any dysuria or urinary frequency, no flank pain.  He denies any rectal pain.  Has had prior hernia surgery, but denies other intra-abdominal surgeries.  No medications for symptoms prior to arrival.  No other aggravating or alleviating factors.  Wears chronic oxygen at baseline on home 2 L, no worsening shortness of breath, no chest pain.        Past Medical History:  Diagnosis Date  . AAA (abdominal aortic aneurysm) ([place])   . Anxiety    due to pain in leg  . Arrhythmia   . Arthritis    Osteo  . Bell's palsy   . Cancer [facility])    Bladder cancer  . Cataracts, bilateral   . CHF (congestive heart failure)  ([place])   . COPD (chronic obstructive pulmonary disease) (H[facility])    Albuterol inhaler as needed  . Diverticulitis   . Enlarged prostate    takes Flomax daily  . GERD (gastroesophageal reflux disease)    was on Prilosec but has been off for yrs-no issues now with reflux  . Headache    migraines years ago  . History of bronchitis    > 53yrs ago  . History of colon polyps    benign  . History of dizziness    several yrs ago-was on Meclizine but has been off for several yrs-no problems since  . History of hiatal hernia   . Hypertension    takes Amlodipine and Lisinpril daily  . Joint pain   . Joint swelling   . Nocturia   . PAD (peripheral artery disease) (HCC)     bilateral superficial femoral artery occlusions   . Paroxysmal atrial fibrillation (H[facility])   . Pneumonia    several times with most recent within 5 yrs ago  . Restless leg syndrome   . Rupture of left quadriceps tendon   . Shortness of breath dyspnea    with exertion  . Sleep apnea    does not use cpap  . Urinary urgency     Patient  Active Problem List   Diagnosis Date Noted  . Lower leg edema 06/14/2019  . Leukocytosis 06/14/2019  . Enlarged prostate   . GERD (gastroesophageal reflux disease)   . B12 deficiency 02/10/2019  . IDA (iron deficiency anemia) 02/08/2019  . Abdominal distention 02/08/2019  . Tobacco use 01/01/2018  . COPD exacerbation ([place]) 12/31/2017  . Otitis media 12/31/2017  . Bronchiectasis without complication ([place]) 53/29/9242  . Acute maxillary sinusitis 08/20/2017  . Cough variant asthma vs uacs  07/17/2017  . Abnormal thyroid blood test 05/07/2017  . Pulmonary infiltrates 02/20/2017  . PAF (paroxysmal atrial fibrillation) ([place]) 02/02/2017  . COPD with acute exacerbation ([place]) 02/02/2017  . Acute respiratory failure with hypoxemia ([place]) 02/02/2017  . Atrial fibrillation with RVR ([place]) 02/02/2017  . Acute on chronic respiratory failure ([place]) 02/02/2017  . SIRS (systemic inflammatory  response syndrome) ([place]) 02/02/2017  . Cigarette smoker 12/14/2016  . DOE (dyspnea on exertion) 12/12/2015  . COPD GOLD II  12/12/2015  . Atherosclerosis of native arteries of right leg with ulceration of other part of foot ([place]) 09/19/2015  . Atherosclerosis of native arteries of the extremities with ulceration ([place]) 07/27/2015  . S/P shoulder replacement 07/07/2014  . Aortic ectasia, abdominal ([place]) 02/26/2012  . Essential hypertension 02/26/2009  . DIVERTICULITIS, HX OF 02/26/2009    Past Surgical History:  Procedure Laterality Date  . BICEPS TENDON REPAIR     LEFT  . CARDIOVERSION N/A 02/06/2017   Procedure: CARDIOVERSION;  Surgeon: Jerline Pain, MD;  [facility] ENDOSCOPY;  Service: Cardiovascular;  Laterality: N/A;  . CARPAL TUNNEL RELEASE Right 2014  . CHOLECYSTECTOMY  July 16,2010  . COLONOSCOPY    . CYSTOSCOPY    . ESOPHAGOGASTRODUODENOSCOPY    . FEMORAL-POPLITEAL BYPASS GRAFT Right 09/19/2015   Procedure: RIGHT COMMON FEMORAL-ABOVE KNEE POPLITEAL ARTERY    BYPASS GRAFT ;  Surgeon: Conrad [place], MD;  Location: [place];  Service: Vascular;  Laterality: Right;  . FOOT SURGERY Right    x 5t  . HAND SURGERY Right   . HERNIA REPAIR Bilateral    inguinal  . PERIPHERAL VASCULAR CATHETERIZATION N/A 08/02/2015   Procedure: Abdominal Aortogram w/Lower Extremity;  Surgeon: Conrad [place], MD;  Location: [place] CV LAB;  Service: Cardiovascular;  Laterality: N/A;  . QUADRICEPS TENDON REPAIR Left 08/22/2016   Procedure: LEFT OPEN QUADRICEP TENDON REPAIR;  Surgeon: Netta Cedars, MD;  Location: [place];  Service: Orthopedics;  Laterality: Left;  . REVERSE SHOULDER ARTHROPLASTY Right 07/07/2014   Procedure: RIGHT REVERSE TOTAL SHOULDER ;  Surgeon: Netta Cedars, MD;  Location: [place];  Service: Orthopedics;  Laterality: Right;  . REVERSE TOTAL SHOULDER ARTHROPLASTY Right 07/07/2014   Dr Veverly Fells  . ROTATOR CUFF REPAIR     Bilateral   . TEE WITHOUT CARDIOVERSION N/A 02/06/2017   Procedure:  TRANSESOPHAGEAL ECHOCARDIOGRAM (TEE);  Surgeon: Jerline Pain, MD;  Location: [facility] ENDOSCOPY;  Service: Cardiovascular;  Laterality: N/A;  . TONSILLECTOMY    . tumor removed from bladder    . VEIN HARVEST Right 09/19/2015   Procedure: WITH NON REVERSE RIGHT GREATER SAPHENOUS VEIN HARVEST;  Surgeon: Conrad Gurabo, MD;  Location: [place];  Service: Vascular;  Laterality: Right;       Family History  Problem Relation Age of Onset  . Parkinson's disease Mother   . AAA (abdominal aortic aneurysm) Father   . Heart defect Sister        Congenital Heart Disease  . Heart disease Sister  Social History   Tobacco Use  . Smoking status: Current Some Day Smoker    Packs/day: 0.25    Years: 45.00    Pack years: 11.25    Types: Cigars    Last attempt to quit: 01/07/2019    Years since quitting: 1.3  . Smokeless tobacco: Never Used  . Tobacco comment: smokes 1/3 of a cigar a day- 12/27/2019  Vaping Use  . Vaping Use: Never used  Substance Use Topics  . Alcohol use: Yes    Alcohol/week: 0.0 standard drinks    Comment: wine occasionally  . Drug use: Not Currently    Types: Marijuana    Home Medications Prior to Admission medications   Medication Sig Start Date End Date Taking? Authorizing Provider  amoxicillin-clavulanate (AUGMENTIN) 875-125 MG tablet Take 1 tablet by mouth 2 (two) times daily. One po bid x 7 days 05/28/20  Yes Jacqlyn Larsen, PA-C  acetaminophen (TYLENOL) 500 MG tablet Take 1,000 mg by mouth every 6 (six) hours as needed (for pain or headaches).     [provider]  albuterol (PROVENTIL HFA;VENTOLIN HFA) 108 (90 BASE) MCG/ACT inhaler Inhale 1-2 puffs into the lungs every 6 (six) hours as needed for wheezing or shortness of breath. Reported on 10/03/2015    [provider]  amLODipine (NORVASC) 5 MG tablet Take 5 mg by mouth daily. 12/06/19   [provider]  apixaban (ELIQUIS) 5 MG TABS tablet Take 1 tablet (5 mg total) 2 (two) times daily by mouth.  02/08/17   Cristal Ford, DO  baclofen (LIORESAL) 10 MG tablet Take 10 mg by mouth 2 (two) times daily. 11/28/19   [provider]  diphenhydrAMINE (BENADRYL) 25 MG tablet Take 25 mg by mouth every 6 (six) hours as needed for itching or allergies.     [provider]  docusate sodium (COLACE) 100 MG capsule Take 1 capsule (100 mg total) by mouth 2 (two) times daily. Patient taking differently: Take 100 mg by mouth daily as needed. 01/04/18   Lavina Hamman, MD  famotidine (PEPCID) 20 MG tablet TAKE (1) TABLET BY MOUTH AT BEDTIME. Patient taking differently: Take 20 mg by mouth at bedtime. TAKE (1) TABLET BY MOUTH AT BEDTIME. 04/19/19   Tanda Rockers, MD  Fluticasone-Umeclidin-Vilant (TRELEGY ELLIPTA) 100-62.5-25 MCG/INH AEPB Inhale 1 puff into the lungs daily. 05/09/20   Spero Geralds, MD  guaiFENesin (MUCINEX) 600 MG 12 hr tablet Take 600 mg by mouth 2 (two) times daily as needed for cough or to loosen phlegm.    [provider]  hydrOXYzine (ATARAX/VISTARIL) 25 MG tablet Take 1 tablet (25 mg total) by mouth at bedtime. 06/19/19   Manuella Ghazi, Pratik D, DO  ipratropium-albuterol (DUONEB) 0.5-2.5 (3) MG/3ML SOLN Take 3 mLs by nebulization every 4 (four) hours as needed. 03/14/19   Roxan Hockey, MD  levocetirizine (XYZAL) 5 MG tablet Take 5 mg by mouth every evening.    [provider]  metoprolol tartrate (LOPRESSOR) 25 MG tablet Take 0.5 tablets (12.5 mg total) by mouth 2 (two) times daily. 03/14/19   Emokpae, Courage, MD  montelukast (SINGULAIR) 10 MG tablet TAKE (1) TABLET BY MOUTH AT BEDTIME. Patient taking differently: Take 10 mg by mouth at bedtime. 08/12/18   Tanda Rockers, MD  pantoprazole (PROTONIX) 40 MG tablet TAKE 1 TABLET BY MOUTH 30 TO 60 MINUTES PRIOR TO THE FIRST MEAL OF THE DAY. Patient taking differently: Take 40 mg by mouth daily. 09/15/18   Christinia Gully  B, MD  potassium chloride (KLOR-CON) 20 MEQ tablet Take 1 tablet (20 mEq total) by mouth 2  (two) times daily. 06/18/19 12/27/19  Manuella Ghazi, Pratik D, DO  predniSONE (DELTASONE) 20 MG tablet Take 2 tablets (40 mg total) by mouth daily with breakfast. 02/10/20   Spero Geralds, MD  roflumilast (DALIRESP) 500 MCG TABS tablet Take 1 tablet (500 mcg total) by mouth daily. 05/09/20   Spero Geralds, MD  Tamsulosin HCl (FLOMAX) 0.4 MG CAPS Take 0.4 mg by mouth daily after supper.     [provider]  torsemide (DEMADEX) 20 MG tablet Take 2 tablets (40 mg total) by mouth daily. 06/18/19 12/27/19  Manuella Ghazi, Pratik D, DO  vitamin B-12 (CYANOCOBALAMIN) 500 MCG tablet Take 1,000 mcg by mouth in the morning and at bedtime.     [provider]    Allergies    Adhesive [tape], Aspartame and phenylalanine, Other, and Sulfonamide derivatives  Review of Systems   Review of Systems  Constitutional: Negative for chills and fever.  HENT: Negative.   Respiratory: Negative for cough and shortness of breath.   Cardiovascular: Negative for chest pain.  Gastrointestinal: Positive for abdominal pain and constipation. Negative for blood in stool, diarrhea, nausea and vomiting.  Genitourinary: Negative for dysuria and frequency.  Musculoskeletal: Negative for arthralgias and myalgias.  Skin: Negative for color change and rash.  Neurological: Negative for dizziness, syncope and light-headedness.  All other systems reviewed and are negative.   Physical Exam Updated Vital Signs BP 96/80   Pulse 77   Temp 97.7 F (36.5 C) (Oral)   Resp 18   SpO2 94%   Physical Exam Vitals and nursing note reviewed.  Constitutional:      General: He is not in acute distress.    Appearance: He is well-developed. He is obese. He is not diaphoretic.     Comments: Elderly gentleman, alert, chronically ill-appearing but in no acute distress  HENT:     Head: Normocephalic and atraumatic.     Mouth/Throat:     Mouth: Mucous membranes are moist.     Pharynx: Oropharynx is clear.  Eyes:     General:        Right  eye: No discharge.        Left eye: No discharge.     Pupils: Pupils are equal, round, and reactive to light.  Cardiovascular:     Rate and Rhythm: Normal rate and regular rhythm.     Heart sounds: Normal heart sounds.  Pulmonary:     Effort: Pulmonary effort is normal. No respiratory distress.     Breath sounds: Normal breath sounds. No wheezing or rales.     Comments: On home 2 L nasal cannula patient with equal and unlabored respirations, able to speak in full sentences, maintaining normal O2 sats, on auscultation breath sounds are slightly diminished bilaterally, suspect this is chronic, no wheezes, rales or rhonchi Abdominal:     General: Bowel sounds are normal. There is no distension.     Palpations: Abdomen is soft. There is no mass.     Tenderness: There is abdominal tenderness in the left lower quadrant. There is no guarding.     Comments: Abdomen is soft and nondistended, bowel sounds present throughout, there is focal tenderness in the left lower quadrant without guarding, all other quadrants nontender to palpation.  No peritoneal signs, no CVA tenderness.  Musculoskeletal:        General: No deformity.  Cervical back: Neck supple.  Skin:    General: Skin is warm and dry.     Capillary Refill: Capillary refill takes less than 2 seconds.  Neurological:     Mental Status: He is alert.     Coordination: Coordination normal.     Comments: Speech is clear, able to follow commands Moves extremities without ataxia, coordination intact  Psychiatric:        Mood and Affect: Mood normal.        Behavior: Behavior normal.     ED Results / Procedures / Treatments   Labs (all labs ordered are listed, but only abnormal results are displayed) Labs Reviewed  CBC WITH DIFFERENTIAL/PLATELET - Abnormal; Notable for the following components:      Result Value   RDW 16.1 (*)    Platelets 411 (*)    Abs Immature Granulocytes 0.13 (*)    All other components within normal limits   COMPREHENSIVE METABOLIC PANEL - Abnormal; Notable for the following components:   Glucose, Bld 119 (*)    All other components within normal limits  URINALYSIS, ROUTINE W REFLEX MICROSCOPIC - Abnormal; Notable for the following components:   APPearance HAZY (*)    Hgb urine dipstick MODERATE (*)    Protein, ur 30 (*)    Leukocytes,Ua TRACE (*)    All other components within normal limits  LACTIC ACID, PLASMA  LIPASE, BLOOD    EKG None  Radiology CT ABDOMEN PELVIS W CONTRAST  Result Date: 05/28/2020 CLINICAL DATA:  Left lower quadrant pain for 1 week. Remote history of bladder cancer. EXAM: CT ABDOMEN AND PELVIS WITH CONTRAST TECHNIQUE: Multidetector CT imaging of the abdomen and pelvis was performed using the standard protocol following bolus administration of intravenous contrast. CONTRAST:  132mL OMNIPAQUE IOHEXOL 300 MG/ML  SOLN COMPARISON:  Abdominal ultrasound 02/10/2019. CT abdomen and pelvis 10/10/2009. FINDINGS: Lower chest: Mild atelectasis or scarring in the right greater than left lower lobes. No pleural effusion. Hepatobiliary: Hepatic steatosis. Status post cholecystectomy. No biliary dilatation. Pancreas: Unremarkable. Spleen: Unremarkable. Adrenals/Urinary Tract: Unremarkable adrenal glands. Prominent right upper pole renal scarring. 3 mm left renal stone. No hydronephrosis or renal mass. Unremarkable bladder. Stomach/Bowel: The stomach is unremarkable. There is no evidence of bowel obstruction. There is diverticulosis of the sigmoid colon with moderately extensive sigmoid colon wall thickening and mild surrounding inflammatory stranding. No extraluminal gas or fluid collection is identified. The appendix is unremarkable. Vascular/Lymphatic: Aortic atherosclerosis with 4.7 cm diameter infrarenal abdominal aortic aneurysm, larger than on the 2020 ultrasound. No enlarged lymph nodes. Reproductive: Enlarged prostate mildly indenting the bladder base. Other: No intraperitoneal free  fluid. Small fat containing umbilical hernia. Musculoskeletal: No acute osseous abnormality or suspicious osseous lesion. Moderate lumbar disc and facet degeneration. Bilateral sacroiliac joint ankylosis. IMPRESSION: 1. Acute, uncomplicated sigmoid colon diverticulitis. 2. Hepatic steatosis. 3. Nonobstructing left nephrolithiasis. 4. 4.7 cm infrarenal abdominal aortic aneurysm. Recommend follow-up CT/MR every 6 months and vascular consultation. This recommendation follows ACR consensus guidelines: White Paper of the ACR Incidental Findings Committee II on Vascular Findings. J Am Coll Radiol 2013; 10:789-794. Aortic Atherosclerosis (ICD10-I70.0). Aortic aneurysm NOS (ICD10-I71.9). Electronically Signed   By: Logan Bores M.D.   On: 05/28/2020 17:03    Procedures Procedures   Medications Ordered in ED Medications  ondansetron (ZOFRAN) injection 4 mg (4 mg Intravenous Given 05/28/20 1621)  fentaNYL (SUBLIMAZE) injection 50 mcg (50 mcg Intravenous Given 05/28/20 1621)  iohexol (OMNIPAQUE) 300 MG/ML solution 100 mL (100 mLs Intravenous Contrast Given  05/28/20 1641)  amoxicillin-clavulanate (AUGMENTIN) 875-125 MG per tablet 1 tablet (1 tablet Oral Given 05/28/20 1834)    ED Course  I have reviewed the triage vital signs and the nursing notes.  Pertinent labs & imaging results that were available during my care of the patient were reviewed by me and considered in my medical decision making (see chart for details).  MDM Rules/Calculators/A&P                         Patient presents to the ED with complaints of abdominal pain. Patient is chronically ill appearing but nontoxic appearing, in no apparent distress, vitals WNL. On exam patient tender to palpation in LLQ, no peritoneal signs.  Patient also reports a change in the shape of his stools for the past month.  Concern for potential diverticulitis, colitis, colon mass, UTI, kidney stone, nephrolithiasis, hernia, obstruction, pancreatitis.  Will evaluate with  labs and CT abdomen pelvis. Analgesics and anti-emetics administered.   I have independently ordered, reviewed and interpreted all labs and imaging:   CBC: No leukocytosis, normal hemoglobin CMP: Glucose of 119, no other electrolyte derangements, normal renal and liver function Lipase: WNL Lactic acid: Not elevated UA: Trace leukocytes with a few WBCs, but no other signs of infection, no bacteria suspect this could be more so inflammatory, doubt UTI  CT reviewed, consistent with acute uncomplicated sigmoid diverticulitis, no evidence of abscess or perforation.  Hepatic steatosis as well as left nonobstructing nephrolithiasis noted.  Patient has had some increase in AAA, today measures 4.7 cm, recommend vascular follow-up and repeat imaging.  Discussed these results with patient, he reports his pain has been more mild and he would prefer to treat this at home and does not want to be admitted to the hospital.  Given his stable vitals and reassuring lab work without peritoneal signs on exam I feel this is reasonable.  Patient able to tolerate p.o. and given first dose of antibiotics here in the ED, will treat with course of Augmentin and stressed the importance of GI follow-up especially given change in stool shape, after his colonoscopy was canceled a few years ago.  No signs of masses on imaging today.  Provided strict return precautions.  We will also have patient follow-up with vascular surgery regarding increase in size of infrarenal AAA.  He expresses understanding and agreement.  Discharged home in good condition.  Final Clinical Impression(s) / ED Diagnoses Final diagnoses:  Diverticulitis    Rx / DC Orders ED Discharge Orders         Ordered    amoxicillin-clavulanate (AUGMENTIN) 875-125 MG tablet  2 times daily        05/28/20 1844           Jacqlyn Larsen, PA-C 06/01/20 0717    Hayden Rasmussen, MD 06/03/20 [phone]

## 2020-05-29 ENCOUNTER — Encounter (HOSPITAL_COMMUNITY): Payer: PPO

## 2020-05-29 NOTE — Telephone Encounter (Signed)
Called and spoke to pt's wife. She states they have not received any word from [employer]. Advised her we would message them back about when pt will be taken care of. Will update chart when [employer] responds via [employer].   ------------------------------------------------------------ Sanjuana Kava, CMA; Darlina Guys; Skeet Latch; [place], [place]   thanks, pulling docs now

## 2020-05-29 NOTE — Telephone Encounter (Signed)
Documents have already been uploaded and submitted to billing. Billing is processing everything now and will reach out to the patient to confirm everything is billing correctly. No further action is needed.   Thanks!   I called and spoke with [name] and notified of response per [employer]. She verbalized understanding and will let us know if the pt does not get a call from them in a timely matter. Nothing further needed at this time.

## 2020-05-31 ENCOUNTER — Encounter (HOSPITAL_COMMUNITY): Payer: PPO

## 2020-06-04 DIAGNOSIS — C44329 Squamous cell carcinoma of skin of other parts of face: Secondary | ICD-10-CM | POA: Diagnosis not present

## 2020-06-04 DIAGNOSIS — D485 Neoplasm of uncertain behavior of skin: Secondary | ICD-10-CM | POA: Diagnosis not present

## 2020-06-04 DIAGNOSIS — C44622 Squamous cell carcinoma of skin of right upper limb, including shoulder: Secondary | ICD-10-CM | POA: Diagnosis not present

## 2020-06-04 DIAGNOSIS — L821 Other seborrheic keratosis: Secondary | ICD-10-CM | POA: Diagnosis not present

## 2020-06-05 NOTE — Addendum Note (Signed)
Encounter addended by: Lorin Glass on: 06/05/2020 10:26 AM  Actions taken: Clinical Note Signed, Episode resolved

## 2020-06-05 NOTE — Progress Notes (Signed)
Discharge Progress Report  Patient Details  Name: Derrick Moon MRN: [id number] Date of Birth: 12-Jun-1940 Referring Provider:   Flowsheet Row PULMONARY REHAB COPD ORIENTATION from 01/27/2020 in [place]  Referring Provider Dr. Shearon Stalls        Number of Visits: 20  Reason for Discharge:  Early Exit:  Personal  Smoking History:  Social History   Tobacco Use  Smoking Status Current Some Day Smoker  . Packs/day: 0.25  . Years: 45.00  . Pack years: 11.25  . Types: Cigars  . Last attempt to quit: 01/07/2019  . Years since quitting: 1.4  Smokeless Tobacco Never Used  Tobacco Comment   smokes 1/3 of a cigar a day- 12/27/2019    Diagnosis:  COPD with chronic bronchitis and emphysema ([place])  ADL UCSD:  Pulmonary Assessment Scores    Row Name 01/27/20 1418         ADL UCSD   ADL Phase Entry     SOB Score total 58           CAT Score   CAT Score 25           mMRC Score   mMRC Score 1            Initial Exercise Prescription:  Initial Exercise Prescription - 01/27/20 1400      Date of Initial Exercise RX and Referring Provider   Date 01/27/20    Referring Provider Dr. Shearon Stalls     Expected Discharge Date 05/31/20      Oxygen   Oxygen Continuous    Liters 2      NuStep   Level 1    SPM 60    Minutes 17      Arm Ergometer   Level 1    RPM 60    Minutes 22      Prescription Details   Frequency (times per week) 2    Duration Progress to 30 minutes of continuous aerobic without signs/symptoms of physical distress      Intensity   THRR 40-80% of Max Heartrate 56-112    Ratings of Perceived Exertion 11-13    Perceived Dyspnea 0-4      Resistance Training   Training Prescription Yes    Weight 2 lbs    Reps 10-15           Discharge Exercise Prescription (Final Exercise Prescription Changes):  Exercise Prescription Changes - 05/08/20 1311      Response to Exercise   Blood Pressure (Admit) 138/72    Blood Pressure  (Exercise) 156/76    Blood Pressure (Exit) 126/70    Heart Rate (Admit) 71 bpm    Heart Rate (Exercise) 74 bpm    Heart Rate (Exit) 76 bpm    Oxygen Saturation (Admit) 96 %    Oxygen Saturation (Exercise) 96 %    Oxygen Saturation (Exit) 98 %    Rating of Perceived Exertion (Exercise) 12    Perceived Dyspnea (Exercise) 11    Duration Continue with 30 min of aerobic exercise without signs/symptoms of physical distress.    Intensity THRR unchanged      Progression   Progression Continue to progress workloads to maintain intensity without signs/symptoms of physical distress.      Resistance Training   Training Prescription Yes    Weight 3 lbs    Reps 10-15    Time 10 Minutes      Oxygen   Oxygen Continuous    Liters  2      NuStep   Level 2    SPM 80    Minutes 17    METs 1.9      Arm Ergometer   Level 1    RPM 42    Minutes 22    METs 1.5           Functional Capacity:  6 Minute Walk    Row Name 01/27/20 1449         6 Minute Walk   Phase Initial     Distance 600 feet     Walk Time 6 minutes     # of Rest Breaks 1     MPH 1.14     METS 0.9     RPE 13     Perceived Dyspnea  15     VO2 Peak 3.16     Symptoms Yes (comment)     Comments 1 seated rest break for 2 min due to cramps in his quads. He rated the cramps a 7/10 for pain     Resting HR 57 bpm     Resting BP 108/80     Resting Oxygen Saturation  96 %     Exercise Oxygen Saturation  during 6 min walk 94 %     Max Ex. HR 89 bpm     Max Ex. BP 128/76     2 Minute Post BP 110/78           Interval HR   1 Minute HR 83     2 Minute HR 86     3 Minute HR 89     4 Minute HR 74     5 Minute HR 74     6 Minute HR 83     2 Minute Post HR 66     Interval Heart Rate? Yes           Interval Oxygen   Interval Oxygen? Yes     Baseline Oxygen Saturation % 96 %     1 Minute Oxygen Saturation % 94 %     1 Minute Liters of Oxygen 2 L     2 Minute Oxygen Saturation % 94 %     2 Minute Liters of Oxygen  2 L     3 Minute Oxygen Saturation % 95 %     3 Minute Liters of Oxygen 2 L     4 Minute Oxygen Saturation % 97 %     4 Minute Liters of Oxygen 2 L     5 Minute Oxygen Saturation % 98 %     5 Minute Liters of Oxygen 2 L     6 Minute Oxygen Saturation % 96 %     6 Minute Liters of Oxygen 2 L     2 Minute Post Oxygen Saturation % 97 %     2 Minute Post Liters of Oxygen 2 L            Psychological, QOL, Others - Outcomes: PHQ 2/9: Depression screen [facility] 2/9 01/27/2020 05/07/2017  Decreased Interest 0 0  Down, Depressed, Hopeless 0 0  PHQ - 2 Score 0 0  Altered sleeping 0 -  Tired, decreased energy 1 -  Change in appetite 0 -  Feeling bad or failure about yourself  0 -  Trouble concentrating 0 -  Moving slowly or fidgety/restless 0 -  Suicidal thoughts 0 -  PHQ-9 Score 1 -  Difficult doing work/chores Somewhat  difficult -    Quality of Life:  Quality of Life - 01/27/20 1449      Quality of Life   Select Quality of Life      Quality of Life Scores   Health/Function Pre 20.43 %    Socioeconomic Pre 25.75 %    Psych/Spiritual Pre 26.36 %    Family Pre 27.6 %    GLOBAL Pre 23.74 %           Personal Goals: Goals established at orientation with interventions provided to work toward goal.  Personal Goals and Risk Factors at Admission - 01/27/20 1419      Core Components/Risk Factors/Patient Goals on Admission   Improve shortness of breath with ADL's Yes    Intervention Provide education, individualized exercise plan and daily activity instruction to help decrease symptoms of SOB with activities of daily living.    Expected Outcomes Short Term: Improve cardiorespiratory fitness to achieve a reduction of symptoms when performing ADLs;Long Term: Be able to perform more ADLs without symptoms or delay the onset of symptoms    Heart Failure Yes    Intervention Provide a combined exercise and nutrition program that is supplemented with education, support and counseling about  heart failure. Directed toward relieving symptoms such as shortness of breath, decreased exercise tolerance, and extremity edema.    Expected Outcomes Improve functional capacity of life;Short term: Attendance in program 2-3 days a week with increased exercise capacity. Reported lower sodium intake. Reported increased fruit and vegetable intake. Reports medication compliance.;Short term: Daily weights obtained and reported for increase. Utilizing diuretic protocols set by physician.;Long term: Adoption of self-care skills and reduction of barriers for early signs and symptoms recognition and intervention leading to self-care maintenance.            Personal Goals Discharge:  Goals and Risk Factor Review    Row Name 02/06/20 1328 03/05/20 0858 03/30/20 1259 04/20/20 1334 05/21/20 0841     Core Components/Risk Factors/Patient Goals Review   Personal Goals Review - - - Weight Management/Obesity;Develop more efficient breathing techniques such as purse lipped breathing and diaphragmatic breathing and practicing self-pacing with activity.;Improve shortness of breath with ADL's Weight Management/Obesity;Develop more efficient breathing techniques such as purse lipped breathing and diaphragmatic breathing and practicing self-pacing with activity.;Improve shortness of breath with ADL's   Review Patient is new to the program completing 2 sessions. 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 His personal goals are to return to his ADL's and returnto walking long distances. Will continue to monitor for progress. Patient has completed 8 sessions gaining 2 lbs since last 30 day review. He was referred to pulmonary rehab with COPD with chronic bronchitis and emphysema. He is doing the best he can in the sessions. He continues to exhibit labored breathing with severe stridor. He continues to smoke cigars occasionally. His personal goals are to return to his ADL's and retrun to walking long distance. Will  continue to monitor for progress. Patient has completed 12 sessions gaining 2 lbs since his last 30 day review. He does work hard during sessions. He continues to exhibit labored breathing with severe stridor. He started prednisone back in December. He does miss some sessions due to SOB. He continue to smoke cigars occasionally. His personal goals for the program are to be able to walk long distance and to return to doing his ADL's. We will continue to monitor as he works toward meeting these goals. Patient has only completed 3 sessions  since his last review and gaining another 1lbs since his last review.  He continues to exhibit labored breathing with severe stridor.  Review purse lip breathing with him. Oxygen at 2L and O2 Sats in upper 90"s.   He does miss some sessions due to SOB. He continue to smoke cigars occasionally. His personal goals for the program are to be able to walk long distance and to return to doing his ADL's. We will continue to monitor as he works toward meeting these goals. He continues to exhibit labored breathing with severe stridor.  Review purse lip breathing with him again. Oxygen at 2L and O2 Sats in upper 90"s.   He does miss some sessions due to SOB. He continue to smoke cigars occasionally. His personal goals for the program are to be able to walk long distance and to return to doing his ADL's. Weight fluctuates up and down 1-2 pounds. We will continue to monitor as he works toward meeting these goals.   Expected Outcomes Patient will complete the program meeting both program and personal goals. Patient will complete the program meeting both program and personal goals. Patient will complete the program meeting both program and personal goals. Patient will complete the program meeting both program and personal goals. Patient will complete the program meeting both program and personal goals.          Exercise Goals and Review:  Exercise Goals    Row Name 01/27/20 1452 02/07/20  1535 03/06/20 1514 04/03/20 1530 05/01/20 1619     Exercise Goals   Increase Physical Activity Yes Yes Yes Yes Yes   Intervention Provide advice, education, support and counseling about physical activity/exercise needs.;Develop an individualized exercise prescription for aerobic and resistive training based on initial evaluation findings, risk stratification, comorbidities and participant's personal goals. Provide advice, education, support and counseling about physical activity/exercise needs.;Develop an individualized exercise prescription for aerobic and resistive training based on initial evaluation findings, risk stratification, comorbidities and participant's personal goals. Provide advice, education, support and counseling about physical activity/exercise needs.;Develop an individualized exercise prescription for aerobic and resistive training based on initial evaluation findings, risk stratification, comorbidities and participant's personal goals. Provide advice, education, support and counseling about physical activity/exercise needs.;Develop an individualized exercise prescription for aerobic and resistive training based on initial evaluation findings, risk stratification, comorbidities and participant's personal goals. Provide advice, education, support and counseling about physical activity/exercise needs.;Develop an individualized exercise prescription for aerobic and resistive training based on initial evaluation findings, risk stratification, comorbidities and participant's personal goals.   Expected Outcomes Short Term: Attend rehab on a regular basis to increase amount of physical activity.;Long Term: Add in home exercise to make exercise part of routine and to increase amount of physical activity.;Long Term: Exercising regularly at least 3-5 days a week. Short Term: Attend rehab on a regular basis to increase amount of physical activity.;Long Term: Add in home exercise to make exercise part of  routine and to increase amount of physical activity.;Long Term: Exercising regularly at least 3-5 days a week. Short Term: Attend rehab on a regular basis to increase amount of physical activity.;Long Term: Add in home exercise to make exercise part of routine and to increase amount of physical activity.;Long Term: Exercising regularly at least 3-5 days a week. Short Term: Attend rehab on a regular basis to increase amount of physical activity.;Long Term: Add in home exercise to make exercise part of routine and to increase amount of physical activity.;Long Term: Exercising regularly at least 3-5  days a week. Short Term: Attend rehab on a regular basis to increase amount of physical activity.;Long Term: Add in home exercise to make exercise part of routine and to increase amount of physical activity.;Long Term: Exercising regularly at least 3-5 days a week.   Increase Strength and Stamina Yes Yes Yes Yes Yes   Intervention Provide advice, education, support and counseling about physical activity/exercise needs.;Develop an individualized exercise prescription for aerobic and resistive training based on initial evaluation findings, risk stratification, comorbidities and participant's personal goals. Provide advice, education, support and counseling about physical activity/exercise needs.;Develop an individualized exercise prescription for aerobic and resistive training based on initial evaluation findings, risk stratification, comorbidities and participant's personal goals. Provide advice, education, support and counseling about physical activity/exercise needs.;Develop an individualized exercise prescription for aerobic and resistive training based on initial evaluation findings, risk stratification, comorbidities and participant's personal goals. Provide advice, education, support and counseling about physical activity/exercise needs.;Develop an individualized exercise prescription for aerobic and resistive  training based on initial evaluation findings, risk stratification, comorbidities and participant's personal goals. Provide advice, education, support and counseling about physical activity/exercise needs.;Develop an individualized exercise prescription for aerobic and resistive training based on initial evaluation findings, risk stratification, comorbidities and participant's personal goals.   Expected Outcomes Short Term: Increase workloads from initial exercise prescription for resistance, speed, and METs.;Short Term: Perform resistance training exercises routinely during rehab and add in resistance training at home;Long Term: Improve cardiorespiratory fitness, muscular endurance and strength as measured by increased METs and functional capacity (6MWT) Short Term: Increase workloads from initial exercise prescription for resistance, speed, and METs.;Short Term: Perform resistance training exercises routinely during rehab and add in resistance training at home;Long Term: Improve cardiorespiratory fitness, muscular endurance and strength as measured by increased METs and functional capacity (6MWT) Short Term: Increase workloads from initial exercise prescription for resistance, speed, and METs.;Short Term: Perform resistance training exercises routinely during rehab and add in resistance training at home;Long Term: Improve cardiorespiratory fitness, muscular endurance and strength as measured by increased METs and functional capacity (6MWT) Short Term: Increase workloads from initial exercise prescription for resistance, speed, and METs.;Short Term: Perform resistance training exercises routinely during rehab and add in resistance training at home;Long Term: Improve cardiorespiratory fitness, muscular endurance and strength as measured by increased METs and functional capacity (6MWT) Short Term: Increase workloads from initial exercise prescription for resistance, speed, and METs.;Short Term: Perform resistance  training exercises routinely during rehab and add in resistance training at home;Long Term: Improve cardiorespiratory fitness, muscular endurance and strength as measured by increased METs and functional capacity (6MWT)   Able to understand and use rate of perceived exertion (RPE) scale Yes Yes Yes Yes Yes   Intervention Provide education and explanation on how to use RPE scale Provide education and explanation on how to use RPE scale Provide education and explanation on how to use RPE scale Provide education and explanation on how to use RPE scale Provide education and explanation on how to use RPE scale   Expected Outcomes Short Term: Able to use RPE daily in rehab to express subjective intensity level;Long Term:  Able to use RPE to guide intensity level when exercising independently Short Term: Able to use RPE daily in rehab to express subjective intensity level;Long Term:  Able to use RPE to guide intensity level when exercising independently Short Term: Able to use RPE daily in rehab to express subjective intensity level;Long Term:  Able to use RPE to guide intensity level when exercising independently  Short Term: Able to use RPE daily in rehab to express subjective intensity level;Long Term:  Able to use RPE to guide intensity level when exercising independently Short Term: Able to use RPE daily in rehab to express subjective intensity level;Long Term:  Able to use RPE to guide intensity level when exercising independently   Able to understand and use Dyspnea scale Yes Yes Yes Yes Yes   Intervention Provide education and explanation on how to use Dyspnea scale Provide education and explanation on how to use Dyspnea scale Provide education and explanation on how to use Dyspnea scale Provide education and explanation on how to use Dyspnea scale Provide education and explanation on how to use Dyspnea scale   Expected Outcomes Short Term: Able to use Dyspnea scale daily in rehab to express subjective sense  of shortness of breath during exertion;Long Term: Able to use Dyspnea scale to guide intensity level when exercising independently Short Term: Able to use Dyspnea scale daily in rehab to express subjective sense of shortness of breath during exertion;Long Term: Able to use Dyspnea scale to guide intensity level when exercising independently Long Term: Able to use Dyspnea scale to guide intensity level when exercising independently;Short Term: Able to use Dyspnea scale daily in rehab to express subjective sense of shortness of breath during exertion Long Term: Able to use Dyspnea scale to guide intensity level when exercising independently;Short Term: Able to use Dyspnea scale daily in rehab to express subjective sense of shortness of breath during exertion Long Term: Able to use Dyspnea scale to guide intensity level when exercising independently;Short Term: Able to use Dyspnea scale daily in rehab to express subjective sense of shortness of breath during exertion   Knowledge and understanding of Target Heart Rate Range (THRR) Yes Yes Yes Yes Yes   Intervention Provide education and explanation of THRR including how the numbers were predicted and where they are located for reference Provide education and explanation of THRR including how the numbers were predicted and where they are located for reference Provide education and explanation of THRR including how the numbers were predicted and where they are located for reference Provide education and explanation of THRR including how the numbers were predicted and where they are located for reference Provide education and explanation of THRR including how the numbers were predicted and where they are located for reference   Expected Outcomes Short Term: Able to state/look up THRR;Long Term: Able to use THRR to govern intensity when exercising independently;Short Term: Able to use daily as guideline for intensity in rehab Short Term: Able to state/look up THRR;Long  Term: Able to use THRR to govern intensity when exercising independently;Short Term: Able to use daily as guideline for intensity in rehab Short Term: Able to state/look up THRR;Long Term: Able to use THRR to govern intensity when exercising independently;Short Term: Able to use daily as guideline for intensity in rehab Short Term: Able to state/look up THRR;Long Term: Able to use THRR to govern intensity when exercising independently;Short Term: Able to use daily as guideline for intensity in rehab Short Term: Able to state/look up THRR;Long Term: Able to use THRR to govern intensity when exercising independently;Short Term: Able to use daily as guideline for intensity in rehab   Understanding of Exercise Prescription Yes Yes Yes Yes Yes   Intervention Provide education, explanation, and written materials on patient's individual exercise prescription Provide education, explanation, and written materials on patient's individual exercise prescription Provide education, explanation, and written materials on patient's individual  exercise prescription Provide education, explanation, and written materials on patient's individual exercise prescription Provide education, explanation, and written materials on patient's individual exercise prescription   Expected Outcomes Short Term: Able to explain program exercise prescription;Long Term: Able to explain home exercise prescription to exercise independently Short Term: Able to explain program exercise prescription;Long Term: Able to explain home exercise prescription to exercise independently Short Term: Able to explain program exercise prescription;Long Term: Able to explain home exercise prescription to exercise independently Short Term: Able to explain program exercise prescription;Long Term: Able to explain home exercise prescription to exercise independently Short Term: Able to explain program exercise prescription;Long Term: Able to explain home exercise  prescription to exercise independently          Exercise Goals Re-Evaluation:  Exercise Goals Re-Evaluation    Row Name 02/07/20 1535 03/06/20 1514 04/03/20 1530 05/01/20 1620       Exercise Goal Re-Evaluation   Exercise Goals Review Increase Physical Activity;Increase Strength and Stamina;Able to understand and use rate of perceived exertion (RPE) scale;Able to understand and use Dyspnea scale;Knowledge and understanding of Target Heart Rate Range (THRR);Understanding of Exercise Prescription Increase Physical Activity;Increase Strength and Stamina;Able to understand and use rate of perceived exertion (RPE) scale;Able to understand and use Dyspnea scale;Knowledge and understanding of Target Heart Rate Range (THRR);Understanding of Exercise Prescription Increase Physical Activity;Increase Strength and Stamina;Able to understand and use rate of perceived exertion (RPE) scale;Able to understand and use Dyspnea scale;Knowledge and understanding of Target Heart Rate Range (THRR);Understanding of Exercise Prescription Increase Physical Activity;Increase Strength and Stamina;Able to understand and use rate of perceived exertion (RPE) scale;Able to understand and use Dyspnea scale;Knowledge and understanding of Target Heart Rate Range (THRR);Understanding of Exercise Prescription    Comments Pt has completed 3 exercise sessions. Pt has been complaining of pain in his left quad. He was wheezing today and did not bring his rescue inhaler. We encouraged him to bring his rescue inhaler to rehab. He also complains of left shoulder pain. He is exercising at 1.4 METs on the NuStep. Will continue to monitor and progress exercise as able. Patient has completed 9 exercise sessions. He has tolerated exercise well. He still complains of pain in his legs. He is progressing exercise well also. He is improving on his spm and he is determined when he comes into rehab to get as far as he can, distance wise, on the machines. He  is currently exercising at 2.1 METs on the NuStep. Will continue to monitor and progress exercise as able. Pt has completed 13 exercise sessions. He missed several visits this month due to illness and regressed in terms of his conditioning. His work of breathing remains elevated and at times can be concerning. He currently exercises at 1.8 METs on the stepper. Will continue to monitor and progress as able. Patient has completed 17 exercise sessions. He is progressing his exercise slowly. He continues to be short of breath and his breathing remains elevated. He is currently exercising at 1.8 METs on the NuStep. Will continue to monitor and progress as able.    Expected Outcomes Through exercising at rehab and at home patient will reach their goals. Through exercise at rehab and with a home exercise program, patient will achieve their goals. Through exercise at rehab and with a home exercise program, patient will achieve their goals. Through exercise at rehab and with a home exercise program, patient will achieve their goals.           Nutrition & Weight -  Outcomes:  Pre Biometrics - 04/17/20 1619      Pre Biometrics   Weight 103.1 kg            Nutrition:  Nutrition Therapy & Goals - 05/21/20 0852      Personal Nutrition Goals   Comments We will continue to provide heart healthy nutritional education through hand-outs.      Intervention Plan   Intervention Nutrition handout(s) given to patient.           Nutrition Discharge:  Nutrition Assessments - 01/27/20 1418      MEDFICTS Scores   Pre Score 21           Education Questionnaire Score:  Knowledge Questionnaire Score - 01/27/20 1419      Knowledge Questionnaire Score   Pre Score 16/18          Patient was discharged from the program after 20 sessions.

## 2020-06-13 NOTE — Progress Notes (Signed)
Referring Provider: [name], MD Primary Care Physician:  [name], MD Primary GI Physician: Dr. Gala Romney  Chief Complaint  Patient presents with  . Abdominal Pain    Abd pain on left and right side    HPI:   Derrick Moon is a 80 y.o. male presenting today for follow-up of abdominal pain recently diagnosed with diverticulitis in the emergency room.   Recently evaluated in the emergency room 05/28/2020 for LLQ abdominal pain and change in stool caliber to thin stools without BRBPR or melena.  Change in stools have been present for over a month.  CT A/P with contrast with acute uncomplicated sigmoid colon diverticulitis.  Also with 4.7 cm infrarenal abdominal aortic aneurysm.  He was stable in the ED and no leukocytosis or fever.  He was prescribed Augmentin twice daily x7 days.  Also recommended follow-up with vascular surgery regarding AAA.  Last seen in our office 02/08/2019 for new onset iron deficiency anemia.  Also with low B12.  No overt GI bleeding and reportedly heme-negative. He was originally scheduled for EGD and Colonosocpy, but later had other health issues so his procedure was canceled.  Recommended follow-up with Dr. Melvyn Novas and office visit with [place] to reevaluate.  COPD has been difficult to manage with multiple exacerbations requiring antibiotics and prednisone.  Today: Reports having a virus about 6 weeks ago with nausea, vomiting, and diarrhea. Then he developed diverticulitis.  At that time, he was having abdominal pain and 2 BMs daily that looked thin like "Fritos".  No BRBPR or melena.  He took antibiotics prescribed by the emergency room and abdominal pain resolved.  Bowel movements returned to normal caliber and are soft and formed.  Typically with bowel movements daily at this point.  He has had 2-3 BMs daily prior to acute illness and diverticulitis.  However, he tells me he is still following a soft diet and is very careful with what he is eating since being diagnosed  with diverticulitis.  States he is not eating as much as he used to.  He is not having any nausea, vomiting, early satiety, GERD symptoms, or dysphagia.  He has a good appetite.  He has had a couple episodes where he had "spasms" in the lower abdomen, but no persistent abdominal pain since antibiotics.  He did wake up last night around 1:30 AM with a short-lived sharp pain in the left lower quadrant.  Patient tells me every time he has ever had diverticulitis in the past, it has been on the right side.  Pulmonary doctor has told him he is ok to go under sedation. Since starting Trelegy, he has had better control COPD. On 2L [place] chronically.  No significant shortness of breath at rest.  States he can actually take his oxygen off at times if he is just sitting still. Will get short of breath with exertion.  Sleeps in a recliner for the last year. States if he lays down after eating, he will have reflux.  States sleeping in his recliner is not related to a breathing issue.  Also states if he is upright for at least a couple hours after eating, he does not have any reflux symptoms when laying down.  Denies worsening SOB when laying down. No CP. No heart palpations.   He would like to have a colonoscopy as he was supposed to have this a couple of years ago.  He also asked me about having an upper endoscopy.  Discussed that  this was previously recommended due to iron deficiency anemia.  Most recent hemoglobin 13.2 on 3/7.  No iron panel on file there review of office visit in 2020, reported ferritin 16, iron saturation 6%, iron 21.  Patient states he has never taken any oral iron.  He was started on vitamin B12 twice daily x1 year then decrease to once daily.   Past Medical History:  Diagnosis Date  . AAA (abdominal aortic aneurysm) ([place])   . Anxiety    due to pain in leg  . Arrhythmia   . Arthritis    Osteo  . Bell's palsy   . Cancer [facility])    Bladder cancer  . Cataracts, bilateral   . CHF (congestive  heart failure) ([place])   . COPD (chronic obstructive pulmonary disease) ([facility])    Albuterol inhaler as needed  . Diverticulitis   . Enlarged prostate    takes Flomax daily  . GERD (gastroesophageal reflux disease)   . Headache    migraines years ago  . History of bronchitis    > 40yrs ago  . History of colon polyps    benign  . History of dizziness    several yrs ago-was on Meclizine but has been off for several yrs-no problems since  . History of hiatal hernia   . Hypertension    takes Amlodipine and Lisinpril daily  . Joint pain   . Joint swelling   . Nocturia   . PAD (peripheral artery disease) ([facility])     bilateral superficial femoral artery occlusions   . Paroxysmal atrial fibrillation ([facility])   . Pneumonia    several times with most recent within 5 yrs ago  . Restless leg syndrome   . Rupture of left quadriceps tendon   . Shortness of breath dyspnea    with exertion  . Sleep apnea    does not use cpap  . Urinary urgency     Past Surgical History:  Procedure Laterality Date  . BICEPS TENDON REPAIR     LEFT  . CARDIOVERSION N/A 02/06/2017   Procedure: CARDIOVERSION;  Surgeon: Jerline Pain, MD;  Location: [facility] END[facility];  Service: Cardiovascular;  Laterality: N/A;  . CARPAL TUNNEL RELEASE Right 2014  . CHOLECYSTECTOMY  July 16,2010  . COLONOSCOPY  2003   6 polyps, left sided diverticula. Path with hyperplastic polyps per review of colonoscopy report in 2011.   [name] COLONOSCOPY  03/2009   Scattered left sided diverticula.   . CYSTOSCOPY    . ESOPHAGOGASTRODUODENOSCOPY    . FEMORAL-POPLITEAL BYPASS GRAFT Right 09/19/2015   Procedure: RIGHT COMMON FEMORAL-ABOVE KNEE POPLITEAL ARTERY    BYPASS GRAFT ;  Surgeon: Conrad Malo, MD;  Location: [place];  Service: Vascular;  Laterality: Right;  . FOOT SURGERY Right    x 5t  . HAND SURGERY Right   . HERNIA REPAIR Bilateral    inguinal  . PERIPHERAL VASCULAR CATHETERIZATION N/A 08/02/2015   Procedure: Abdominal Aortogram w/Lower  Extremity;  Surgeon: Conrad , MD;  Location: [facility];  Service: Cardiovascular;  Laterality: N/A;  . QUADRICEPS TENDON REPAIR Left 08/22/2016   Procedure: LEFT OPEN QUADRICEP TENDON REPAIR;  Surgeon: Netta Cedars, MD;  Location: [facility];  Service: Orthopedics;  Laterality: Left;  . REVERSE SHOULDER ARTHROPLASTY Right 07/07/2014   Procedure: RIGHT REVERSE TOTAL SHOULDER ;  Surgeon: Netta Cedars, MD;  Location: [facility];  Service: Orthopedics;  Laterality: Right;  . REVERSE TOTAL SHOULDER ARTHROPLASTY Right 07/07/2014   Dr  Norris  . ROTATOR CUFF REPAIR     Bilateral   . TEE WITHOUT CARDIOVERSION N/A 02/06/2017   Procedure: TRANSESOPHAGEAL ECHOCARDIOGRAM (TEE);  Surgeon: Jerline Pain, MD;  Location: [facility] END[facility];  Service: Cardiovascular;  Laterality: N/A;  . TONSILLECTOMY    . tumor removed from bladder    . VEIN HARVEST Right 09/19/2015   Procedure: WITH NON REVERSE RIGHT GREATER SAPHENOUS VEIN HARVEST;  Surgeon: Conrad Verdi, MD;  Location: [place];  Service: Vascular;  Laterality: Right;    Current Outpatient Medications  Medication Sig Dispense Refill  . acetaminophen (TYLENOL) 500 MG tablet Take 1,000 mg by mouth as needed (for pain or headaches).    [name] albuterol (PROVENTIL HFA;VENTOLIN HFA) 108 (90 BASE) MCG/ACT inhaler Inhale 1-2 puffs into the lungs every 6 (six) hours as needed for wheezing or shortness of breath. Reported on 10/03/2015    . amLODipine (NORVASC) 5 MG tablet Take 5 mg by mouth daily.    [name] apixaban (ELIQUIS) 5 MG TABS tablet Take 1 tablet (5 mg total) 2 (two) times daily by mouth. 60 tablet 0  . baclofen (LIORESAL) 10 MG tablet Take 10 mg by mouth daily.    . diphenhydrAMINE (BENADRYL) 25 MG tablet Take 25 mg by mouth as needed for itching or allergies.    [name] docusate sodium (COLACE) 100 MG capsule Take 1 capsule (100 mg total) by mouth 2 (two) times daily. (Patient taking differently: Take 100 mg by mouth daily as needed.) 10 capsule 0  . famotidine (PEPCID) 20  MG tablet TAKE (1) TABLET BY MOUTH AT BEDTIME. (Patient taking differently: Take 20 mg by mouth at bedtime. TAKE (1) TABLET BY MOUTH AT BEDTIME.) 30 tablet 0  . Fluticasone-Umeclidin-Vilant (TRELEGY ELLIPTA) 100-62.5-25 MCG/INH AEPB Inhale 1 puff into the lungs daily. 1 each 5  . guaiFENesin (MUCINEX) 600 MG 12 hr tablet Take 600 mg by mouth as needed for cough or to loosen phlegm.    . hydrOXYzine (ATARAX/VISTARIL) 25 MG tablet Take 1 tablet (25 mg total) by mouth at bedtime. 30 tablet 0  . ipratropium-albuterol (DUONEB) 0.5-2.5 (3) MG/3ML SOLN Take 3 mLs by nebulization every 4 (four) hours as needed. 360 mL 0  . levocetirizine (XYZAL) 5 MG tablet Take 5 mg by mouth every evening.    . metoprolol tartrate (LOPRESSOR) 25 MG tablet Take 0.5 tablets (12.5 mg total) by mouth 2 (two) times daily. (Patient taking differently: Take 12.5 mg by mouth daily.) 30 tablet 3  . montelukast (SINGULAIR) 10 MG tablet TAKE (1) TABLET BY MOUTH AT BEDTIME. (Patient taking differently: Take 10 mg by mouth at bedtime.) 30 tablet 1  . pantoprazole (PROTONIX) 40 MG tablet TAKE 1 TABLET BY MOUTH 30 TO 60 MINUTES PRIOR TO THE FIRST MEAL OF THE DAY. (Patient taking differently: Take 40 mg by mouth daily.) 30 tablet 0  . potassium chloride (KLOR-CON) 20 MEQ tablet Take 1 tablet (20 mEq total) by mouth 2 (two) times daily. 60 tablet 1  . Tamsulosin HCl (FLOMAX) 0.4 MG CAPS Take 0.4 mg by mouth daily after supper.     . torsemide (DEMADEX) 20 MG tablet Take 2 tablets (40 mg total) by mouth daily. 60 tablet 2  . vitamin B-12 (CYANOCOBALAMIN) 500 MCG tablet Take 500 mcg by mouth daily.     No current facility-administered medications for this visit.    Allergies as of 06/14/2020 - Review Complete 06/14/2020  Allergen Reaction Noted  . Adhesive [tape] Other (See Comments) 12/31/2017  .  Aspartame and phenylalanine Nausea Only 07/07/2014  . Other Rash 09/19/2015  . Sulfonamide derivatives Nausea And Vomiting     Family  History  Problem Relation Age of Onset  . Parkinson's disease Mother   . AAA (abdominal aortic aneurysm) Father   . Heart defect Sister        Congenital Heart Disease  . Heart disease Sister     Social History   Socioeconomic History  . Marital status: Married    Spouse name: Not on file  . Number of children: Not on file  . Years of education: Not on file  . Highest education level: Associate degree: occupational, Hotel manager, or vocational program  Occupational History  . Not on file  Tobacco Use  . Smoking status: Current Some Day Smoker    Packs/day: 0.25    Years: 45.00    Pack years: 11.25    Types: Cigars    Last attempt to quit: 01/07/2019    Years since quitting: 1.4  . Smokeless tobacco: Never Used  . Tobacco comment: smokes 1/3 of a cigar a day- 12/27/2019  Vaping Use  . Vaping Use: Never used  Substance and Sexual Activity  . Alcohol use: Yes    Alcohol/week: 0.0 standard drinks    Comment: wine occasionally  . Drug use: Not Currently    Types: Marijuana  . Sexual activity: Yes  Other Topics Concern  . Not on file  Social History Narrative   Pt lives w/ wife in [place], [place]   Social Determinants of Health   Financial Resource Strain: Not on file  Food Insecurity: Not on file  Transportation Needs: Not on file  Physical Activity: Not on file  Stress: Not on file  Social Connections: Not on file    Review of Systems: Gen: Denies fever, chills, cold or flulike symptoms, lightheadedness, dizziness, presyncope, syncope. CV: See HPI Resp: See HPI GI: See HPI Heme: See HPI  Physical Exam: BP 131/75   Pulse 68   Temp (!) 97.5 F (36.4 C) (Temporal)   Ht 5\' 11"  (1.803 m)   Wt 216 lb 6.4 oz (98.2 kg)   BMI 30.18 kg/m  General: Well-developed well-nourished elderly male alert and oriented and in no distress. Pleasant and cooperative.  Able to lie flat on exam table for several minutes without trouble. Head:  Normocephalic and atraumatic. Eyes:   Conjuctiva clear without scleral icterus. Heart:  S1, S2 present without murmurs appreciated. Lungs:  Clear to auscultation bilaterally. No wheezes, rales, or rhonchi. No distress.  Abdomen:  +BS, soft, and non-distended.  Mild tenderness to deep palpation in the LLQ and RLQ.  No rebound or guarding. No HSM or masses noted. Msk:  Symmetrical without gross deformities. Normal posture. Extremities:  Without edema. Neurologic:  Alert and  oriented x4 Psych:  Normal mood and affect.   Assessment: 80 year old male presenting today for follow-up of lower abdominal pain, recently diagnosed with acute uncomplicated sigmoid diverticulitis in the emergency room on 05/28/2020 and treated with Augmentin twice daily x7 days.  Since that time, his abdominal pain has essentially resolved aside from occasional "spasms" in the left or right abdomen, but no persistent pain.  He had also reported change in stool caliber with thin stools at the time of diverticulitis, but this has also resolved and stool caliber is back to normal.  He has had some weight loss since diagnosis as he has been limiting his diet and following a soft/liquid diet.  He also tells me  he had an acute viral illness a couple weeks prior to diverticulitis with nausea, vomiting, and diarrhea.  Denies BRBPR or melena.  On exam, he has mild tenderness to deep palpation in the LLQ and RLQ.  No rebound or guarding.  Suspect this may be to resolving inflammation as it can take 6-8 weeks for complete resolution.  Last colonoscopy in 2011 with scattered left-sided diverticula.  In general, it is recommended for patient to have a colonoscopy following an episode of diverticulitis if this has not been updated within the last year.  Additionally, we had previously seen patient in 2020 for new onset IDA and had plans for colonoscopy and EGD at that time, but due to pulmonary status, he was to follow-up with pulmonology and have another office visit prior to  procedures, but we have not seen patient since that time.  Notably, most recent hemoglobin on 3/7 was 13.2.  No recent iron panel.  Patient denies ever starting oral iron.  He did start vitamin B12 twice daily x1 year and has continued with once daily dosing.  Patient would like to have a colonoscopy and is also asking about an EGD.  It is reasonable to update a colonoscopy considering recent episode of diverticulitis and change in stool caliber.  However, if no persistent IDA, I do not see the need for EGD.  We will plan to update CBC and iron panel at this time.  If no evidence of iron deficiency anemia, we will proceed with colonoscopy only.  Plan: 1.  CBC and iron panel with ferritin.  2.  If no IDA, proceed with colonoscopy with propofol with Dr. Gala Romney in 6-8 weeks. The risks, benefits, and alternatives have been discussed with the patient in detail. The patient states understanding and desires to proceed.  ASA III *We will need to get the okay to hold Eliquis x48 hours (per patient this is prescribed by Dr. Willey Blade) as well as pulmonary clearance.  3.  Continue soft, low fiber diet for the next week.  After this, advance diet as tolerated.  4.  In 1 week, start Benefiber daily.  5.  Advised he monitor for return of persistent abdominal pain and let me know if this occurs.  Discussed the increased risk of colonic perforation if active inflammation.   Aliene Altes, PA-C [facility] 06/14/2020

## 2020-06-14 ENCOUNTER — Encounter: Payer: Self-pay | Admitting: Gastroenterology

## 2020-06-14 ENCOUNTER — Other Ambulatory Visit: Payer: Self-pay | Admitting: Gastroenterology

## 2020-06-14 ENCOUNTER — Ambulatory Visit: Payer: PPO | Admitting: Gastroenterology

## 2020-06-14 ENCOUNTER — Other Ambulatory Visit: Payer: Self-pay

## 2020-06-14 VITALS — BP 131/75 | HR 68 | Temp 97.5°F | Ht 71.0 in | Wt 216.4 lb

## 2020-06-14 DIAGNOSIS — Z8719 Personal history of other diseases of the digestive system: Secondary | ICD-10-CM | POA: Diagnosis not present

## 2020-06-14 DIAGNOSIS — D509 Iron deficiency anemia, unspecified: Secondary | ICD-10-CM

## 2020-06-14 LAB — IRON,TIBC AND FERRITIN PANEL
%SAT: 11 % (calc) — ABNORMAL LOW (ref 20–48)
Ferritin: 68 ng/mL (ref 24–380)
Iron: 35 ug/dL — ABNORMAL LOW (ref 50–180)
TIBC: 305 mcg/dL (calc) (ref 250–425)

## 2020-06-14 LAB — CBC WITH DIFFERENTIAL/PLATELET
Absolute Monocytes: 836 cells/uL (ref 200–950)
Basophils Absolute: 44 cells/uL (ref 0–200)
Basophils Relative: 0.4 %
Eosinophils Absolute: 154 cells/uL (ref 15–500)
Eosinophils Relative: 1.4 %
HCT: 42.6 % (ref 38.5–50.0)
Hemoglobin: 13.3 g/dL (ref 13.2–17.1)
Lymphs Abs: 2629 cells/uL (ref 850–3900)
MCH: 25.8 pg — ABNORMAL LOW (ref 27.0–33.0)
MCHC: 31.2 g/dL — ABNORMAL LOW (ref 32.0–36.0)
MCV: 82.6 fL (ref 80.0–100.0)
MPV: 10.1 fL (ref 7.5–12.5)
Monocytes Relative: 7.6 %
Neutro Abs: 7337 cells/uL (ref 1500–7800)
Neutrophils Relative %: 66.7 %
Platelets: 394 10*3/uL (ref 140–400)
RBC: 5.16 10*6/uL (ref 4.20–5.80)
RDW: 15.3 % — ABNORMAL HIGH (ref 11.0–15.0)
Total Lymphocyte: 23.9 %
WBC: 11 10*3/uL — ABNORMAL HIGH (ref 3.8–10.8)

## 2020-06-14 NOTE — Patient Instructions (Signed)
Please have labs completed at [facility] may continue to follow a soft, low fiber diet for the next week.  After this, you can advance your diet as tolerated.  In 1 week, start Benefiber daily.  Please monitor for return of abdominal pain and let me know if this occurs.  Once I review your lab results, we will call you with further recommendations.   Aliene Altes, PA-C [facility]

## 2020-06-15 DIAGNOSIS — L905 Scar conditions and fibrosis of skin: Secondary | ICD-10-CM | POA: Diagnosis not present

## 2020-06-15 DIAGNOSIS — C44622 Squamous cell carcinoma of skin of right upper limb, including shoulder: Secondary | ICD-10-CM | POA: Diagnosis not present

## 2020-06-19 ENCOUNTER — Telehealth: Payer: Self-pay

## 2020-06-19 NOTE — Progress Notes (Signed)
CC'ED TO PCP 

## 2020-06-19 NOTE — Telephone Encounter (Signed)
Dr. Edwinna Areola,  Pt was seen in our office and needs an upper endoscopy and colonoscopy. Please advise if it's ok for pt to proceed with procedures per Flossie Dibble, PA. Thank you.

## 2020-06-20 NOTE — Telephone Encounter (Signed)
Noted. Will await pulmonary clearance.

## 2020-06-20 NOTE — Telephone Encounter (Signed)
Received a letter from Dr. Ria Comment office. It is ok for pt to hold Eliquis x 48 hours prior to procedure. Letter will be scanned in pts chart.

## 2020-06-20 NOTE — Telephone Encounter (Signed)
I have addended my most recent progress note for pre-operative pulmonary evaluation. Can be sent to them.

## 2020-06-21 DIAGNOSIS — I10 Essential (primary) hypertension: Secondary | ICD-10-CM | POA: Diagnosis not present

## 2020-06-21 DIAGNOSIS — E785 Hyperlipidemia, unspecified: Secondary | ICD-10-CM | POA: Diagnosis not present

## 2020-06-21 NOTE — Telephone Encounter (Signed)
Kristen please review Dr. Mauricio Po last [place] note (in epic). thanks

## 2020-06-22 ENCOUNTER — Ambulatory Visit: Payer: PPO | Admitting: Vascular Surgery

## 2020-06-22 ENCOUNTER — Encounter: Payer: Self-pay | Admitting: Vascular Surgery

## 2020-06-22 ENCOUNTER — Other Ambulatory Visit: Payer: Self-pay

## 2020-06-22 VITALS — BP 110/57 | HR 70 | Temp 97.5°F | Resp 20 | Ht 71.0 in | Wt 218.0 lb

## 2020-06-22 DIAGNOSIS — I714 Abdominal aortic aneurysm, without rupture, unspecified: Secondary | ICD-10-CM

## 2020-06-22 DIAGNOSIS — I739 Peripheral vascular disease, unspecified: Secondary | ICD-10-CM

## 2020-06-22 NOTE — Progress Notes (Signed)
Patient ID: Derrick Moon, male   DOB: 1940/10/04, 80 y.o.   MRN: [id number]  Reason for Consult: New Patient (Initial Visit)   Referred by Hayden Rasmussen, MD  Subjective:     HPI:  Derrick Moon is a 80 y.o. male history of right femoral-popliteal artery bypass by Dr. Bridgett Larsson.  He was also followed for abdominal aortic aneurysm.  He is a lifelong smoker currently smoking Black and mild.  He is on home oxygen.  He walks with the help of a Rollator.  He denies any tissue loss or ulceration.  Denies history of stroke, TIA or amaurosis.  No new back or abdominal pain.  Past Medical History:  Diagnosis Date  . AAA (abdominal aortic aneurysm) ([place])   . Anxiety    due to pain in leg  . Arrhythmia   . Arthritis    Osteo  . Bell's palsy   . Cancer [facility])    Bladder cancer  . Cataracts, bilateral   . CHF (congestive heart failure) ([place])   . COPD (chronic obstructive pulmonary disease) (H[facility])    Albuterol inhaler as needed  . Diverticulitis   . Enlarged prostate    takes Flomax daily  . GERD (gastroesophageal reflux disease)   . Headache    migraines years ago  . History of bronchitis    > 49yrs ago  . History of colon polyps    benign  . History of dizziness    several yrs ago-was on Meclizine but has been off for several yrs-no problems since  . History of hiatal hernia   . Hypertension    takes Amlodipine and Lisinpril daily  . Joint pain   . Joint swelling   . Nocturia   . PAD (peripheral artery disease) (HCC)     bilateral superficial femoral artery occlusions   . Paroxysmal atrial fibrillation (H[facility])   . Pneumonia    several times with most recent within 5 yrs ago  . Restless leg syndrome   . Rupture of left quadriceps tendon   . Shortness of breath dyspnea    with exertion  . Sleep apnea    does not use cpap  . Urinary urgency    Family History  Problem Relation Age of Onset  . Parkinson's disease Mother   . AAA (abdominal aortic aneurysm) Father   . Heart  defect Sister        Congenital Heart Disease  . Heart disease Sister    Past Surgical History:  Procedure Laterality Date  . BICEPS TENDON REPAIR     LEFT  . CARDIOVERSION N/A 02/06/2017   Procedure: CARDIOVERSION;  Surgeon: Jerline Pain, MD;  Location: [facility] ENDOSCOPY;  Service: Cardiovascular;  Laterality: N/A;  . CARPAL TUNNEL RELEASE Right 2014  . CHOLECYSTECTOMY  July 16,2010  . COLONOSCOPY  2003   6 polyps, left sided diverticula. Path with hyperplastic polyps per review of colonoscopy report in 2011.   [name] COLONOSCOPY  03/2009   Scattered left sided diverticula.   . CYSTOSCOPY    . ESOPHAGOGASTRODUODENOSCOPY    . FEMORAL-POPLITEAL BYPASS GRAFT Right 09/19/2015   Procedure: RIGHT COMMON FEMORAL-ABOVE KNEE POPLITEAL ARTERY    BYPASS GRAFT ;  Surgeon: Conrad [place], MD;  Location: [place];  Service: Vascular;  Laterality: Right;  . FOOT SURGERY Right    x 5t  . HAND SURGERY Right   . HERNIA REPAIR Bilateral    inguinal  . PERIPHERAL VASCULAR CATHETERIZATION N/A 08/02/2015  Procedure: Abdominal Aortogram w/Lower Extremity;  Surgeon: Conrad Fulton, MD;  Location: [place] CV LAB;  Service: Cardiovascular;  Laterality: N/A;  . QUADRICEPS TENDON REPAIR Left 08/22/2016   Procedure: LEFT OPEN QUADRICEP TENDON REPAIR;  Surgeon: Netta Cedars, MD;  Location: [place];  Service: Orthopedics;  Laterality: Left;  . REVERSE SHOULDER ARTHROPLASTY Right 07/07/2014   Procedure: RIGHT REVERSE TOTAL SHOULDER ;  Surgeon: Netta Cedars, MD;  Location: [place];  Service: Orthopedics;  Laterality: Right;  . REVERSE TOTAL SHOULDER ARTHROPLASTY Right 07/07/2014   Dr Veverly Fells  . ROTATOR CUFF REPAIR     Bilateral   . TEE WITHOUT CARDIOVERSION N/A 02/06/2017   Procedure: TRANSESOPHAGEAL ECHOCARDIOGRAM (TEE);  Surgeon: Jerline Pain, MD;  Location: [facility] ENDOSCOPY;  Service: Cardiovascular;  Laterality: N/A;  . TONSILLECTOMY    . tumor removed from bladder    . VEIN HARVEST Right 09/19/2015   Procedure: WITH NON  REVERSE RIGHT GREATER SAPHENOUS VEIN HARVEST;  Surgeon: Conrad [place], MD;  Location: [facility] OR;  Service: Vascular;  Laterality: Right;    Short Social History:  Social History   Tobacco Use  . Smoking status: Current Some Day Smoker    Packs/day: 0.25    Years: 45.00    Pack years: 11.25    Types: Cigars    Last attempt to quit: 01/07/2019    Years since quitting: 1.4  . Smokeless tobacco: Never Used  . Tobacco comment: smokes 1/3 of a cigar a day- 12/27/2019  Substance Use Topics  . Alcohol use: Yes    Alcohol/week: 0.0 standard drinks    Comment: wine occasionally    Allergies  Allergen Reactions  . Adhesive [Tape] Other (See Comments)    SKIN IS VERY THIN AND IT TEARS VERY EASILY!!! Please use an alternative  . Aspartame And Phenylalanine Nausea Only  . Other Rash    Pt reports rash on back, buttocks, shoulders and stomach after arteriogram  . Sulfonamide Derivatives Nausea And Vomiting    Current Outpatient Medications  Medication Sig Dispense Refill  . acetaminophen (TYLENOL) 500 MG tablet Take 1,000 mg by mouth as needed (for pain or headaches).    [name] albuterol (PROVENTIL HFA;VENTOLIN HFA) 108 (90 BASE) MCG/ACT inhaler Inhale 1-2 puffs into the lungs every 6 (six) hours as needed for wheezing or shortness of breath. Reported on 10/03/2015    . amLODipine (NORVASC) 5 MG tablet Take 5 mg by mouth daily.    [name] apixaban (ELIQUIS) 5 MG TABS tablet Take 1 tablet (5 mg total) 2 (two) times daily by mouth. 60 tablet 0  . baclofen (LIORESAL) 10 MG tablet Take 10 mg by mouth daily.    . diphenhydrAMINE (BENADRYL) 25 MG tablet Take 25 mg by mouth as needed for itching or allergies.    . diphenoxylate-atropine (LOMOTIL) 2.5-0.025 MG tablet     . docusate sodium (COLACE) 100 MG capsule Take 1 capsule (100 mg total) by mouth 2 (two) times daily. (Patient taking differently: Take 100 mg by mouth daily as needed.) 10 capsule 0  . famotidine (PEPCID) 20 MG tablet TAKE (1) TABLET BY MOUTH AT  BEDTIME. (Patient taking differently: Take 20 mg by mouth at bedtime. TAKE (1) TABLET BY MOUTH AT BEDTIME.) 30 tablet 0  . Fluticasone-Umeclidin-Vilant (TRELEGY ELLIPTA) 100-62.5-25 MCG/INH AEPB Inhale 1 puff into the lungs daily. 1 each 5  . guaiFENesin (MUCINEX) 600 MG 12 hr tablet Take 600 mg by mouth as needed for cough or to loosen phlegm.    [name]  hydrOXYzine (ATARAX/VISTARIL) 25 MG tablet Take 1 tablet (25 mg total) by mouth at bedtime. 30 tablet 0  . ipratropium-albuterol (DUONEB) 0.5-2.5 (3) MG/3ML SOLN Take 3 mLs by nebulization every 4 (four) hours as needed. 360 mL 0  . levocetirizine (XYZAL) 5 MG tablet Take 5 mg by mouth every evening.    [name] losartan (COZAAR) 50 MG tablet Take 50 mg by mouth daily.    . metoprolol tartrate (LOPRESSOR) 25 MG tablet Take 0.5 tablets (12.5 mg total) by mouth 2 (two) times daily. (Patient taking differently: Take 12.5 mg by mouth daily.) 30 tablet 3  . montelukast (SINGULAIR) 10 MG tablet TAKE (1) TABLET BY MOUTH AT BEDTIME. (Patient taking differently: Take 10 mg by mouth at bedtime.) 30 tablet 1  . pantoprazole (PROTONIX) 40 MG tablet TAKE 1 TABLET BY MOUTH 30 TO 60 MINUTES PRIOR TO THE FIRST MEAL OF THE DAY. (Patient taking differently: Take 40 mg by mouth daily.) 30 tablet 0  . Tamsulosin HCl (FLOMAX) 0.4 MG CAPS Take 0.4 mg by mouth daily after supper.     . vitamin B-12 (CYANOCOBALAMIN) 500 MCG tablet Take 500 mcg by mouth daily.    . potassium chloride (KLOR-CON) 20 MEQ tablet Take 1 tablet (20 mEq total) by mouth 2 (two) times daily. 60 tablet 1  . torsemide (DEMADEX) 20 MG tablet Take 2 tablets (40 mg total) by mouth daily. 60 tablet 2   No current facility-administered medications for this visit.    Review of Systems  Constitutional:  Constitutional negative. HENT: HENT negative.  Eyes: Eyes negative.  Respiratory: Positive for shortness of breath and wheezing.  GI: Gastrointestinal negative.  Musculoskeletal: Positive for leg pain.  Skin:  Skin negative.  Neurological: Neurological negative. Hematologic: Hematologic/lymphatic negative.  Psychiatric: Psychiatric negative.        Objective:  Objective   Vitals:   06/22/20 1034  BP: (!) 110/57  Pulse: 70  Resp: 20  Temp: (!) 97.5 F (36.4 C)  SpO2: 98%  Weight: 218 lb (98.9 kg)  Height: 5\' 11"  (1.803 m)   Body mass index is 30.4 kg/m.  Physical Exam HENT:     Head: Normocephalic.     Nose:     Comments: Wearing a mask Eyes:     Pupils: Pupils are equal, round, and reactive to light.  Neck:     Vascular: No carotid bruit.  Cardiovascular:     Rate and Rhythm: Normal rate.  Abdominal:     General: There is distension.     Palpations: Abdomen is soft. There is no mass.  Musculoskeletal:        General: No swelling. Normal range of motion.     Cervical back: Normal range of motion and neck supple.  Skin:    General: Skin is warm and dry.     Capillary Refill: Capillary refill takes less than 2 seconds.  Neurological:     General: No focal deficit present.     Mental Status: He is alert.  Psychiatric:        Mood and Affect: Mood normal.        Behavior: Behavior normal.        Thought Content: Thought content normal.        Judgment: Judgment normal.     Data: CT IMPRESSION: 1. Acute, uncomplicated sigmoid colon diverticulitis. 2. Hepatic steatosis. 3. Nonobstructing left nephrolithiasis. 4. 4.7 cm infrarenal abdominal aortic aneurysm. Recommend follow-up CT/MR every 6 months and vascular consultation. This recommendation follows  ACR consensus guidelines: White Paper of the ACR Incidental Findings Committee II on Vascular Findings. J Am Coll Radiol 2013; 10:789-794.     Assessment/Plan:    80 year old male with history of right lower extremity bypass also has abdominal aortic aneurysm.  He has not been here for several years.  Recent CT demonstrates aneurysm to be 4.7 cm.  For that he will follow up in 6 months with duplex.  We will also get  duplex of his right lower extremity with ABIs at that time.  I discussed the signs and symptoms of rupture as well as the risk he demonstrates good understanding in the presence of his wife we will get him back in 6 months for follow-up.      Waynetta Sandy MD [facility]

## 2020-06-25 NOTE — Telephone Encounter (Signed)
Reviewed Dr. Mauricio Moon last note.  For general anesthesia, patient falls in the intermediate risk of respiratory complications/post-op respiratory failure in the setting of general anesthesia.  States these risk can be acceptable given the potential benefit of intervention and is not prohibitive. Notably, EGD and colonoscopy are not performed under general anesthesia.  General post-op recommendations per Dr. Shearon Moon: - Encourage aggressive incentive spirometry hourly both peri-operatively and post-operatively as tolerated  - Early ambulation and physical therapy as tolerated post-operatively - Adequate pain control especially in the setting of abdominal and thoracic surgery - Bronchodilators as needed for wheezing or shortness of breath - Oral steroids only if the patient appears to have component of COPD exacerbation, otherwise no routine utilization needed - Ideally we recommend smoking cessation for >4 weeks before any intervention - Intraoperatively keep OR time to the shortest as possible  I have discussed Dr. Mauricio Moon assessment and recommendations with the patient, and he is ready to proceed with Colonoscopy and possible EGD.  He does tell me he has an incentive spirometer at home.    [facility] Clinical Pool: Please arrange colonoscopy with possible EGD with propofol with Dr. Gala Moon in the near future. Please arrange at least 6+ weeks from now due to history of diverticulitis.   Dx: Hx of sigmoid diverticulitis, iron deficiency  ASA III/IV  Hold Eliquis x48 hours prior.

## 2020-06-25 NOTE — Telephone Encounter (Signed)
Called and informed pt we will call him to schedule procedure when Dr. Roseanne Kaufman June schedule is available.

## 2020-06-26 ENCOUNTER — Other Ambulatory Visit: Payer: Self-pay

## 2020-06-26 DIAGNOSIS — I739 Peripheral vascular disease, unspecified: Secondary | ICD-10-CM

## 2020-06-26 DIAGNOSIS — I714 Abdominal aortic aneurysm, without rupture, unspecified: Secondary | ICD-10-CM

## 2020-07-05 ENCOUNTER — Encounter: Payer: Self-pay | Admitting: *Deleted

## 2020-07-05 MED ORDER — PEG 3350-KCL-NA BICARB-NACL 420 G PO SOLR: ORAL | 0 refills | Status: DC

## 2020-07-05 NOTE — Addendum Note (Signed)
Addended by: Cheron Every on: 07/05/2020 02:12 PM   Modules accepted: Orders

## 2020-07-05 NOTE — Telephone Encounter (Signed)
Called pt. He has been scheduled for 6/6 at 11:00am. Aware will mail prep instructions with pre-op/covid test appt. Confirmed address. Confirmed pharmacy.

## 2020-07-12 DIAGNOSIS — I480 Paroxysmal atrial fibrillation: Secondary | ICD-10-CM | POA: Diagnosis not present

## 2020-07-12 DIAGNOSIS — I10 Essential (primary) hypertension: Secondary | ICD-10-CM | POA: Diagnosis not present

## 2020-07-12 DIAGNOSIS — Z79899 Other long term (current) drug therapy: Secondary | ICD-10-CM | POA: Diagnosis not present

## 2020-07-12 DIAGNOSIS — I5032 Chronic diastolic (congestive) heart failure: Secondary | ICD-10-CM | POA: Diagnosis not present

## 2020-07-12 DIAGNOSIS — J441 Chronic obstructive pulmonary disease with (acute) exacerbation: Secondary | ICD-10-CM | POA: Diagnosis not present

## 2020-07-19 ENCOUNTER — Other Ambulatory Visit (HOSPITAL_COMMUNITY): Payer: Self-pay | Admitting: Internal Medicine

## 2020-07-19 DIAGNOSIS — D509 Iron deficiency anemia, unspecified: Secondary | ICD-10-CM | POA: Diagnosis not present

## 2020-07-19 DIAGNOSIS — J9621 Acute and chronic respiratory failure with hypoxia: Secondary | ICD-10-CM | POA: Diagnosis not present

## 2020-07-19 DIAGNOSIS — I5032 Chronic diastolic (congestive) heart failure: Secondary | ICD-10-CM | POA: Diagnosis not present

## 2020-07-19 DIAGNOSIS — N644 Mastodynia: Secondary | ICD-10-CM | POA: Diagnosis not present

## 2020-07-21 DIAGNOSIS — J449 Chronic obstructive pulmonary disease, unspecified: Secondary | ICD-10-CM | POA: Diagnosis not present

## 2020-07-21 DIAGNOSIS — I10 Essential (primary) hypertension: Secondary | ICD-10-CM | POA: Diagnosis not present

## 2020-07-23 ENCOUNTER — Other Ambulatory Visit: Payer: Self-pay

## 2020-07-23 ENCOUNTER — Encounter: Payer: Self-pay | Admitting: Internal Medicine

## 2020-07-23 ENCOUNTER — Ambulatory Visit: Payer: PPO | Admitting: Internal Medicine

## 2020-07-23 VITALS — HR 65 | Ht 71.0 in | Wt 220.0 lb

## 2020-07-23 DIAGNOSIS — J441 Chronic obstructive pulmonary disease with (acute) exacerbation: Secondary | ICD-10-CM | POA: Diagnosis not present

## 2020-07-23 MED ORDER — AZITHROMYCIN 500 MG PO TABS: 250.0000 mg | ORAL_TABLET | ORAL | 5 refills | Status: DC

## 2020-07-23 NOTE — Patient Instructions (Addendum)
The patient should have follow up scheduled with myself in 3 months.   Stop daliresp.  Start azithromycin MWF Continue trelegy and albuterol with duoneb.

## 2020-07-23 NOTE — Progress Notes (Signed)
Derrick Moon    [id number]    1941/03/15  Primary Care Physician:Fagan, Carloyn Manner, MD Date of Appointment: 07/23/2020 Established Patient Visit  Chief complaint:   Chief Complaint  Patient presents with  . COPD    3 month follow up, 2.5 L - 2 3/4L     HPI: Derrick Moon is a 80 y.o. gentleman with CHF and COPD.   Interval Updates: Here for follow up for COPD. Had interval hospitalization for diverticulitis. Has colonoscopy scheduled in June.   Recent course of abx and prednisone from Dr. Willey Blade. Feels some improvement now, today is day 3. (prednisone and doxycycline.) Taking albuterol about twice a day which is less than before. Feels better with trelegy  Taking nebulizer treatments about twice a day, usually in the morning. Breathing worse first thing in the morning.   Daliresp made him feel crazy.  I have reviewed the patient's family social and past medical history and updated as appropriate.   Past Medical History:  Diagnosis Date  . AAA (abdominal aortic aneurysm) ([place])   . Anxiety    due to pain in leg  . Arrhythmia   . Arthritis    Osteo  . Bell's palsy   . Cancer [facility])    Bladder cancer  . Cataracts, bilateral   . CHF (congestive heart failure) ([place])   . COPD (chronic obstructive pulmonary disease) (H[facility])    O2 supplementation dependent  . Diverticulitis   . Enlarged prostate    takes Flomax daily  . GERD (gastroesophageal reflux disease)   . Headache    migraines years ago  . History of bronchitis    > 56yrs ago  . History of colon polyps    benign  . History of dizziness    several yrs ago-was on Meclizine but has been off for several yrs-no problems since  . History of hiatal hernia   . Hypertension    takes Amlodipine and Lisinpril daily  . Joint pain   . Joint swelling   . Nocturia   . PAD (peripheral artery disease) (H[facility])     bilateral superficial femoral artery occlusions   . Paroxysmal atrial fibrillation (H[facility])   . Pneumonia     several times with most recent within 5 yrs ago  . Restless leg syndrome   . Rupture of left quadriceps tendon   . Shortness of breath dyspnea    with exertion  . Sleep apnea    does not use cpap  . Urinary urgency     Past Surgical History:  Procedure Laterality Date  . BICEPS TENDON REPAIR     LEFT  . CARDIOVERSION N/A 02/06/2017   Procedure: CARDIOVERSION;  Surgeon: Jerline Pain, MD;  Location: [facility] ENDOSCOPY;  Service: Cardiovascular;  Laterality: N/A;  . CARPAL TUNNEL RELEASE Right 2014  . CHOLECYSTECTOMY  July 16,2010  . COLONOSCOPY  2003   6 polyps, left sided diverticula. Path with hyperplastic polyps per review of colonoscopy report in 2011.   [name] COLONOSCOPY  03/2009   Scattered left sided diverticula.   . CYSTOSCOPY    . ESOPHAGOGASTRODUODENOSCOPY    . FEMORAL-POPLITEAL BYPASS GRAFT Right 09/19/2015   Procedure: RIGHT COMMON FEMORAL-ABOVE KNEE POPLITEAL ARTERY    BYPASS GRAFT ;  Surgeon: Conrad [place], MD;  Location: [place];  Service: Vascular;  Laterality: Right;  . FOOT SURGERY Right    x 5t  . HAND SURGERY Right   . HERNIA REPAIR  Bilateral    inguinal  . PERIPHERAL VASCULAR CATHETERIZATION N/A 08/02/2015   Procedure: Abdominal Aortogram w/Lower Extremity;  Surgeon: Conrad [place], MD;  Location: [place] CV LAB;  Service: Cardiovascular;  Laterality: N/A;  . QUADRICEPS TENDON REPAIR Left 08/22/2016   Procedure: LEFT OPEN QUADRICEP TENDON REPAIR;  Surgeon: Netta Cedars, MD;  Location: [place];  Service: Orthopedics;  Laterality: Left;  . REVERSE SHOULDER ARTHROPLASTY Right 07/07/2014   Procedure: RIGHT REVERSE TOTAL SHOULDER ;  Surgeon: Netta Cedars, MD;  Location: [place];  Service: Orthopedics;  Laterality: Right;  . REVERSE TOTAL SHOULDER ARTHROPLASTY Right 07/07/2014   Dr Veverly Fells  . ROTATOR CUFF REPAIR     Bilateral   . TEE WITHOUT CARDIOVERSION N/A 02/06/2017   Procedure: TRANSESOPHAGEAL ECHOCARDIOGRAM (TEE);  Surgeon: Jerline Pain, MD;  Location: [facility] ENDOSCOPY;   Service: Cardiovascular;  Laterality: N/A;  . TONSILLECTOMY    . tumor removed from bladder    . VEIN HARVEST Right 09/19/2015   Procedure: WITH NON REVERSE RIGHT GREATER SAPHENOUS VEIN HARVEST;  Surgeon: Conrad [place], MD;  Location: [place];  Service: Vascular;  Laterality: Right;    Family History  Problem Relation Age of Onset  . Parkinson's disease Mother   . AAA (abdominal aortic aneurysm) Father   . Heart defect Sister        Congenital Heart Disease  . Heart disease Sister     Social History   Occupational History  . Not on file  Tobacco Use  . Smoking status: Current Some Day Smoker    Packs/day: 0.25    Years: 45.00    Pack years: 11.25    Types: Cigars    Last attempt to quit: 01/07/2019    Years since quitting: 1.5  . Smokeless tobacco: Never Used  . Tobacco comment: smokes 1/3 of a cigar a day- 12/27/2019  Vaping Use  . Vaping Use: Never used  Substance and Sexual Activity  . Alcohol use: Yes    Alcohol/week: 0.0 standard drinks    Comment: wine occasionally  . Drug use: Not Currently    Types: Marijuana  . Sexual activity: Yes    Physical Exam: Pulse 65, height 5\' 11"  (1.803 m), weight 220 lb (99.8 kg), SpO2 94 %.   Gen: chronically ill appearing, in wheel chair, no respiratory distress, cushingoid Resp: coarse breath sounds, no wheezes or crackles CV: diminished, RRR, no mrg Ext: no edema, thin skin    Data Reviewed: Imaging: I have personally reviewed the chest xray March 2021 reveals no acute cardiopulmonary process  Echocardiogram Dec 2020 1. Left ventricular ejection fraction, by visual estimation, is 60 to  65%. The left ventricle has normal function. There is borderline left  ventricular hypertrophy.  2. Left ventricular diastolic parameters are indeterminate.  3. The left ventricle has no regional wall motion abnormalities.  4. Global right ventricle has normal systolic function.The right  ventricular size is normal. No increase in  right ventricular wall  thickness.  5. Left atrial size was normal.  6. Right atrial size was normal.  7. Presence of pericardial fat pad.  8. Trivial pericardial effusion is present.  9. The mitral valve is normal in structure. Trivial mitral valve  regurgitation.  10. The tricuspid valve is normal in structure. Tricuspid valve  regurgitation is trivial.  11. The aortic valve is normal in structure. Aortic valve regurgitation is  not visualized. Mild aortic valve sclerosis without stenosis.  12. The pulmonic valve was not well  visualized. Pulmonic valve  regurgitation is not visualized.  13. The atrial septum is grossly normal.   PFTs:  PFT Results Latest Ref Rng & Units 03/12/2018  FVC-Pre L 3.08  FVC-Predicted Pre % 72  FVC-Post L 3.66  FVC-Predicted Post % 85  Pre FEV1/FVC % % 59  Post FEV1/FCV % % 56  FEV1-Pre L 1.81  FEV1-Predicted Pre % 58  FEV1-Post L 2.05  DLCO uncorrected ml/min/mmHg 14.19  DLCO UNC% % 42  DLVA Predicted % 50  TLC L 7.95  TLC % Predicted % 109  RV % Predicted % 178   I have personally reviewed the patient's PFTs and my interpretation is moderately severe airflow limitation with borderline BD response consistent with COPD. FEV1 58% of predicted  Labs: A1AT within normal range Lab Results  Component Value Date   WBC 11.0 (H) 06/14/2020   HGB 13.3 06/14/2020   HCT 42.6 06/14/2020   MCV 82.6 06/14/2020   PLT 394 06/14/2020     Immunization status: Immunization History  Administered Date(s) Administered  . Fluad Quad(high Dose 65+) 12/27/2019  . Influenza, High Dose Seasonal PF 12/12/2015, 01/08/2017, 12/22/2017, 02/22/2019  . Moderna Sars-Covid-2 Vaccination 05/06/2019, 06/03/2019  . Tdap 08/15/2016    Assessment:  Moderately Severe COPD FEV1 58% with acute exacerbation Chronic Hypoxemic Respiratory Failure on 2.5LNC - stable HFpEF  Plan/Recommendations: Finish prednisone and doxycycline. Frequent exacerbations requiring  prednisone and antitbiotics. Will try azithromycin. Stop daliresp.  Continue pulmonary rehabilitation at [facility] Continue trelegy and prn albuterol. Taking duoneb twice a day as well.    Return to Care: Return in about 3 months (around 10/23/2020).     Lenice Llamas, MD [facility] [place]

## 2020-07-26 ENCOUNTER — Other Ambulatory Visit: Payer: Self-pay

## 2020-07-26 ENCOUNTER — Ambulatory Visit (HOSPITAL_COMMUNITY)
Admission: RE | Admit: 2020-07-26 | Discharge: 2020-07-26 | Disposition: A | Discharge status: Home / Self Care | Payer: PPO | Source: Ambulatory Visit | Attending: Internal Medicine | Admitting: Internal Medicine

## 2020-07-26 DIAGNOSIS — N644 Mastodynia: Secondary | ICD-10-CM

## 2020-07-26 DIAGNOSIS — R922 Inconclusive mammogram: Secondary | ICD-10-CM | POA: Diagnosis not present

## 2020-07-26 DIAGNOSIS — N62 Hypertrophy of breast: Secondary | ICD-10-CM | POA: Diagnosis not present

## 2020-08-21 DIAGNOSIS — I10 Essential (primary) hypertension: Secondary | ICD-10-CM | POA: Diagnosis not present

## 2020-08-21 DIAGNOSIS — J449 Chronic obstructive pulmonary disease, unspecified: Secondary | ICD-10-CM | POA: Diagnosis not present

## 2020-08-22 NOTE — Patient Instructions (Signed)
Derrick Moon  08/22/2020     @PREFPERIOPPHARMACY @   Your procedure is scheduled on  08/27/2020.   Report to [facility] at  0900  A.M.   Call this number if you have problems the morning of surgery:  [phone]   Remember:  Follow the diet and prep instructions given to you by the office.                     Take these medicines the morning of surgery with A SIP OF WATER  Amlodipine, metoprolol, protonix, prednisone.  Use your nebulizer and your inhalers before you come and bring your rescue inhaler with you.     Please brush your teeth.  Do not wear jewelry, make-up or nail polish.  Do not wear lotions, powders, or perfumes, or deodorant.  Do not shave 48 hours prior to surgery.  Men may shave face and neck.  Do not bring valuables to the hospital.  [facility] is not responsible for any belongings or valuables.  Contacts, dentures or bridgework may not be worn into surgery.  Leave your suitcase in the car.  After surgery it may be brought to your room.  For patients admitted to the hospital, discharge time will be determined by your treatment team.  Patients discharged the day of surgery will not be allowed to drive home and must have someone with them for 24 hours.    Special instructions:  DO NOT smoke tobacco or vape for 24 hours.  Please read over the following fact sheets that you were given. Surgical Site Infection Prevention and Anesthesia Post-op Instructions       Upper Endoscopy, Adult, Care After This sheet gives you information about how to care for yourself after your procedure. Your health care provider may also give you more specific instructions. If you have problems or questions, contact your health care provider. What can I expect after the procedure? After the procedure, it is common to have:  A sore throat.  Mild stomach pain or discomfort.  Bloating.  Nausea. Follow these instructions at home:  Follow instructions from  your health care provider about what to eat or drink after your procedure.  Return to your normal activities as told by your health care provider. Ask your health care provider what activities are safe for you.  Take over-the-counter and prescription medicines only as told by your health care provider.  If you were given a sedative during the procedure, it can affect you for several hours. Do not drive or operate machinery until your health care provider says that it is safe.  Keep all follow-up visits as told by your health care provider. This is important.   Contact a health care provider if you have:  A sore throat that lasts longer than one day.  Trouble swallowing. Get help right away if:  You vomit blood or your vomit looks like coffee grounds.  You have: ? A fever. ? Bloody, black, or tarry stools. ? A severe sore throat or you cannot swallow. ? Difficulty breathing. ? Severe pain in your chest or abdomen. Summary  After the procedure, it is common to have a sore throat, mild stomach discomfort, bloating, and nausea.  If you were given a sedative during the procedure, it can affect you for several hours. Do not drive or operate machinery until your health care provider says that it is safe.  Follow instructions from your  health care provider about what to eat or drink after your procedure.  Return to your normal activities as told by your health care provider. This information is not intended to replace advice given to you by your health care provider. Make sure you discuss any questions you have with your health care provider. Document Revised: 03/08/2019 Document Reviewed: 08/10/2017 Elsevier Patient Education  2021 [place].  Colonoscopy, Adult, Care After This sheet gives you information about how to care for yourself after your procedure. Your health care provider may also give you more specific instructions. If you have problems or questions, contact your  health care provider. What can I expect after the procedure? After the procedure, it is common to have:  A small amount of blood in your stool for 24 hours after the procedure.  Some gas.  Mild cramping or bloating of your abdomen. Follow these instructions at home: Eating and drinking  Drink enough fluid to keep your urine pale yellow.  Follow instructions from your health care provider about eating or drinking restrictions.  Resume your normal diet as instructed by your health care provider. Avoid heavy or fried foods that are hard to digest.   Activity  Rest as told by your health care provider.  Avoid sitting for a long time without moving. Get up to take short walks every 1-2 hours. This is important to improve blood flow and breathing. Ask for help if you feel weak or unsteady.  Return to your normal activities as told by your health care provider. Ask your health care provider what activities are safe for you. Managing cramping and bloating  Try walking around when you have cramps or feel bloated.  Apply heat to your abdomen as told by your health care provider. Use the heat source that your health care provider recommends, such as a moist heat pack or a heating pad. ? Place a towel between your skin and the heat source. ? Leave the heat on for 20-30 minutes. ? Remove the heat if your skin turns bright red. This is especially important if you are unable to feel pain, heat, or cold. You may have a greater risk of getting burned.   General instructions  If you were given a sedative during the procedure, it can affect you for several hours. Do not drive or operate machinery until your health care provider says that it is safe.  For the first 24 hours after the procedure: ? Do not sign important documents. ? Do not drink alcohol. ? Do your regular daily activities at a slower pace than normal. ? Eat soft foods that are easy to digest.  Take over-the-counter and  prescription medicines only as told by your health care provider.  Keep all follow-up visits as told by your health care provider. This is important. Contact a health care provider if:  You have blood in your stool 2-3 days after the procedure. Get help right away if you have:  More than a small spotting of blood in your stool.  Large blood clots in your stool.  Swelling of your abdomen.  Nausea or vomiting.  A fever.  Increasing pain in your abdomen that is not relieved with medicine. Summary  After the procedure, it is common to have a small amount of blood in your stool. You may also have mild cramping and bloating of your abdomen.  If you were given a sedative during the procedure, it can affect you for several hours. Do not drive or  operate machinery until your health care provider says that it is safe.  Get help right away if you have a lot of blood in your stool, nausea or vomiting, a fever, or increased pain in your abdomen. This information is not intended to replace advice given to you by your health care provider. Make sure you discuss any questions you have with your health care provider. Document Revised: 03/04/2019 Document Reviewed: 10/04/2018 Elsevier Patient Education  2021 [place] After This sheet gives you information about how to care for yourself after your procedure. Your health care provider may also give you more specific instructions. If you have problems or questions, contact your health care provider. What can I expect after the procedure? After the procedure, it is common to have:  Tiredness.  Forgetfulness about what happened after the procedure.  Impaired judgment for important decisions.  Nausea or vomiting.  Some difficulty with balance. Follow these instructions at home: For the time period you were told by your health care provider:  Rest as needed.  Do not participate in activities where you  could fall or become injured.  Do not drive or use machinery.  Do not drink alcohol.  Do not take sleeping pills or medicines that cause drowsiness.  Do not make important decisions or sign legal documents.  Do not take care of children on your own.      Eating and drinking  Follow the diet that is recommended by your health care provider.  Drink enough fluid to keep your urine pale yellow.  If you vomit: ? Drink water, juice, or soup when you can drink without vomiting. ? Make sure you have little or no nausea before eating solid foods. General instructions  Have a responsible adult stay with you for the time you are told. It is important to have someone help care for you until you are awake and alert.  Take over-the-counter and prescription medicines only as told by your health care provider.  If you have sleep apnea, surgery and certain medicines can increase your risk for breathing problems. Follow instructions from your health care provider about wearing your sleep device: ? Anytime you are sleeping, including during daytime naps. ? While taking prescription pain medicines, sleeping medicines, or medicines that make you drowsy.  Avoid smoking.  Keep all follow-up visits as told by your health care provider. This is important. Contact a health care provider if:  You keep feeling nauseous or you keep vomiting.  You feel light-headed.  You are still sleepy or having trouble with balance after 24 hours.  You develop a rash.  You have a fever.  You have redness or swelling around the IV site. Get help right away if:  You have trouble breathing.  You have new-onset confusion at home. Summary  For several hours after your procedure, you may feel tired. You may also be forgetful and have poor judgment.  Have a responsible adult stay with you for the time you are told. It is important to have someone help care for you until you are awake and alert.  Rest as  told. Do not drive or operate machinery. Do not drink alcohol or take sleeping pills.  Get help right away if you have trouble breathing, or if you suddenly become confused. This information is not intended to replace advice given to you by your health care provider. Make sure you discuss any questions you have with your health care provider. Document Revised:  11/24/2019 Document Reviewed: 02/10/2019 Elsevier Patient Education  [place].

## 2020-08-23 ENCOUNTER — Other Ambulatory Visit (HOSPITAL_COMMUNITY): Payer: PPO | Attending: Internal Medicine

## 2020-08-23 ENCOUNTER — Other Ambulatory Visit: Payer: Self-pay

## 2020-08-23 ENCOUNTER — Encounter (HOSPITAL_COMMUNITY)
Admission: RE | Admit: 2020-08-23 | Discharge: 2020-08-23 | Disposition: A | Discharge status: Home / Self Care | Payer: PPO | Source: Ambulatory Visit | Attending: Internal Medicine | Admitting: Internal Medicine

## 2020-08-23 ENCOUNTER — Encounter (HOSPITAL_COMMUNITY): Payer: Self-pay

## 2020-08-23 DIAGNOSIS — Z8551 Personal history of malignant neoplasm of bladder: Secondary | ICD-10-CM | POA: Diagnosis not present

## 2020-08-23 DIAGNOSIS — N401 Enlarged prostate with lower urinary tract symptoms: Secondary | ICD-10-CM | POA: Diagnosis not present

## 2020-08-23 DIAGNOSIS — R808 Other proteinuria: Secondary | ICD-10-CM | POA: Diagnosis not present

## 2020-08-23 DIAGNOSIS — Z01818 Encounter for other preprocedural examination: Secondary | ICD-10-CM | POA: Insufficient documentation

## 2020-08-23 DIAGNOSIS — R351 Nocturia: Secondary | ICD-10-CM | POA: Diagnosis not present

## 2020-08-23 HISTORY — DX: Dependence on supplemental oxygen: Z99.81

## 2020-08-23 HISTORY — DX: Hypoxemia: R09.02

## 2020-08-23 LAB — BASIC METABOLIC PANEL
Anion gap: 9 (ref 5–15)
BUN: 18 mg/dL (ref 8–23)
CO2: 24 mmol/L (ref 22–32)
Calcium: 9.3 mg/dL (ref 8.9–10.3)
Chloride: 103 mmol/L (ref 98–111)
Creatinine, Ser: 1.13 mg/dL (ref 0.61–1.24)
GFR, Estimated: >60 mL/min (ref >60)
Glucose, Bld: 131 mg/dL — ABNORMAL HIGH (ref 70–99)
Potassium: 4.1 mmol/L (ref 3.5–5.1)
Sodium: 136 mmol/L (ref 135–145)

## 2020-08-27 ENCOUNTER — Ambulatory Visit (HOSPITAL_COMMUNITY): Payer: PPO | Admitting: Anesthesiology

## 2020-08-27 ENCOUNTER — Encounter (HOSPITAL_COMMUNITY): Payer: Self-pay | Admitting: Internal Medicine

## 2020-08-27 ENCOUNTER — Other Ambulatory Visit: Payer: Self-pay

## 2020-08-27 ENCOUNTER — Ambulatory Visit (HOSPITAL_COMMUNITY)
Admission: RE | Admit: 2020-08-27 | Discharge: 2020-08-27 | Disposition: A | Discharge status: Home / Self Care | Payer: PPO | Attending: Internal Medicine | Admitting: Internal Medicine

## 2020-08-27 ENCOUNTER — Encounter (HOSPITAL_COMMUNITY)
Admission: RE | Disposition: A | Discharge status: Home / Self Care | Payer: Self-pay | Source: Home / Self Care | Attending: Internal Medicine

## 2020-08-27 DIAGNOSIS — G4730 Sleep apnea, unspecified: Secondary | ICD-10-CM | POA: Insufficient documentation

## 2020-08-27 DIAGNOSIS — N400 Enlarged prostate without lower urinary tract symptoms: Secondary | ICD-10-CM | POA: Diagnosis not present

## 2020-08-27 DIAGNOSIS — I739 Peripheral vascular disease, unspecified: Secondary | ICD-10-CM | POA: Diagnosis not present

## 2020-08-27 DIAGNOSIS — Z8551 Personal history of malignant neoplasm of bladder: Secondary | ICD-10-CM | POA: Insufficient documentation

## 2020-08-27 DIAGNOSIS — Z86010 Personal history of colonic polyps: Secondary | ICD-10-CM | POA: Diagnosis not present

## 2020-08-27 DIAGNOSIS — Z79899 Other long term (current) drug therapy: Secondary | ICD-10-CM | POA: Diagnosis not present

## 2020-08-27 DIAGNOSIS — K921 Melena: Secondary | ICD-10-CM | POA: Diagnosis not present

## 2020-08-27 DIAGNOSIS — Z888 Allergy status to other drugs, medicaments and biological substances status: Secondary | ICD-10-CM | POA: Insufficient documentation

## 2020-08-27 DIAGNOSIS — Z882 Allergy status to sulfonamides status: Secondary | ICD-10-CM | POA: Insufficient documentation

## 2020-08-27 DIAGNOSIS — D509 Iron deficiency anemia, unspecified: Secondary | ICD-10-CM | POA: Diagnosis not present

## 2020-08-27 DIAGNOSIS — J449 Chronic obstructive pulmonary disease, unspecified: Secondary | ICD-10-CM | POA: Insufficient documentation

## 2020-08-27 DIAGNOSIS — I110 Hypertensive heart disease with heart failure: Secondary | ICD-10-CM | POA: Insufficient documentation

## 2020-08-27 DIAGNOSIS — I509 Heart failure, unspecified: Secondary | ICD-10-CM | POA: Insufficient documentation

## 2020-08-27 DIAGNOSIS — K635 Polyp of colon: Secondary | ICD-10-CM | POA: Insufficient documentation

## 2020-08-27 DIAGNOSIS — Z7901 Long term (current) use of anticoagulants: Secondary | ICD-10-CM | POA: Insufficient documentation

## 2020-08-27 DIAGNOSIS — R933 Abnormal findings on diagnostic imaging of other parts of digestive tract: Secondary | ICD-10-CM | POA: Diagnosis not present

## 2020-08-27 DIAGNOSIS — K5730 Diverticulosis of large intestine without perforation or abscess without bleeding: Secondary | ICD-10-CM | POA: Insufficient documentation

## 2020-08-27 DIAGNOSIS — D120 Benign neoplasm of cecum: Secondary | ICD-10-CM | POA: Insufficient documentation

## 2020-08-27 DIAGNOSIS — I714 Abdominal aortic aneurysm, without rupture: Secondary | ICD-10-CM | POA: Insufficient documentation

## 2020-08-27 DIAGNOSIS — I480 Paroxysmal atrial fibrillation: Secondary | ICD-10-CM | POA: Insufficient documentation

## 2020-08-27 DIAGNOSIS — G510 Bell's palsy: Secondary | ICD-10-CM | POA: Insufficient documentation

## 2020-08-27 DIAGNOSIS — J441 Chronic obstructive pulmonary disease with (acute) exacerbation: Secondary | ICD-10-CM | POA: Diagnosis not present

## 2020-08-27 DIAGNOSIS — Z9981 Dependence on supplemental oxygen: Secondary | ICD-10-CM | POA: Diagnosis not present

## 2020-08-27 DIAGNOSIS — F17210 Nicotine dependence, cigarettes, uncomplicated: Secondary | ICD-10-CM | POA: Insufficient documentation

## 2020-08-27 DIAGNOSIS — Z95828 Presence of other vascular implants and grafts: Secondary | ICD-10-CM | POA: Insufficient documentation

## 2020-08-27 DIAGNOSIS — K449 Diaphragmatic hernia without obstruction or gangrene: Secondary | ICD-10-CM | POA: Diagnosis not present

## 2020-08-27 DIAGNOSIS — Z9049 Acquired absence of other specified parts of digestive tract: Secondary | ICD-10-CM | POA: Insufficient documentation

## 2020-08-27 DIAGNOSIS — Z8249 Family history of ischemic heart disease and other diseases of the circulatory system: Secondary | ICD-10-CM | POA: Insufficient documentation

## 2020-08-27 HISTORY — PX: POLYPECTOMY: SHX149

## 2020-08-27 HISTORY — PX: ESOPHAGOGASTRODUODENOSCOPY (EGD) WITH PROPOFOL: SHX5813

## 2020-08-27 HISTORY — PX: COLONOSCOPY WITH PROPOFOL: SHX5780

## 2020-08-27 HISTORY — PX: HOT HEMOSTASIS: SHX5433

## 2020-08-27 SURGERY — COLONOSCOPY WITH PROPOFOL
Anesthesia: General

## 2020-08-27 MED ORDER — KETAMINE HCL 50 MG/5ML IJ SOSY
PREFILLED_SYRINGE | INTRAMUSCULAR | Status: AC
  Filled 2020-08-27: qty 5

## 2020-08-27 MED ORDER — LIDOCAINE VISCOUS HCL 2 % MT SOLN: 15.0000 mL | Freq: Once | OROMUCOSAL | Status: AC

## 2020-08-27 MED ORDER — PROPOFOL 10 MG/ML IV BOLUS
INTRAVENOUS | Status: DC | PRN
  Administered 2020-08-27: 30 mg via INTRAVENOUS
  Administered 2020-08-27: 100 mg via INTRAVENOUS
  Administered 2020-08-27: 20 mg via INTRAVENOUS

## 2020-08-27 MED ORDER — KETAMINE HCL 10 MG/ML IJ SOLN
INTRAMUSCULAR | Status: DC | PRN
  Administered 2020-08-27: 20 mg via INTRAVENOUS

## 2020-08-27 MED ORDER — LIDOCAINE VISCOUS HCL 2 % MT SOLN
OROMUCOSAL | Status: AC
  Administered 2020-08-27: 15 mL via OROMUCOSAL
  Filled 2020-08-27: qty 15

## 2020-08-27 MED ORDER — IPRATROPIUM-ALBUTEROL 0.5-2.5 (3) MG/3ML IN SOLN: 3.0000 mL | Freq: Once | RESPIRATORY_TRACT | Status: AC

## 2020-08-27 MED ORDER — STERILE WATER FOR IRRIGATION IR SOLN
Status: DC | PRN
  Administered 2020-08-27: 200 mL

## 2020-08-27 MED ORDER — LIDOCAINE HCL (CARDIAC) PF 100 MG/5ML IV SOSY
PREFILLED_SYRINGE | INTRAVENOUS | Status: DC | PRN
  Administered 2020-08-27: 50 mg via INTRAVENOUS

## 2020-08-27 MED ORDER — IPRATROPIUM-ALBUTEROL 0.5-2.5 (3) MG/3ML IN SOLN
RESPIRATORY_TRACT | Status: AC
  Administered 2020-08-27: 3 mL via RESPIRATORY_TRACT
  Filled 2020-08-27: qty 3

## 2020-08-27 MED ORDER — LACTATED RINGERS IV SOLN: INTRAVENOUS | Status: DC

## 2020-08-27 NOTE — Anesthesia Postprocedure Evaluation (Signed)
Anesthesia Post Note  Patient: Derrick Moon  Procedure(s) Performed: COLONOSCOPY WITH PROPOFOL (N/A ) ESOPHAGOGASTRODUODENOSCOPY (EGD) WITH PROPOFOL (N/A ) POLYPECTOMY INTESTINAL HOT HEMOSTASIS (ARGON PLASMA COAGULATION/BICAP)  Patient location during evaluation: Phase II Anesthesia Type: General Level of consciousness: awake and alert and oriented Pain management: pain level controlled Vital Signs Assessment: post-procedure vital signs reviewed and stable Respiratory status: spontaneous breathing and patient connected to nasal cannula oxygen Cardiovascular status: blood pressure returned to baseline and stable Postop Assessment: no apparent nausea or vomiting Anesthetic complications: no   No complications documented.   Last Vitals:  Vitals:   08/27/20 1009 08/27/20 1224  BP:  119/75  Pulse: (P) 67 65  Resp: (P) 19 20  Temp: (P) 36.6 C 36.7 C  SpO2: (P) 99% 96%    Last Pain:  Vitals:   08/27/20 1224  TempSrc: Oral  PainSc: 3                  Nyaja Dubuque C Glennis Montenegro

## 2020-08-27 NOTE — Op Note (Signed)
[facility] Patient Name: Derrick Moon Procedure Date: 08/27/2020 11:17 AM MRN: [id number] Date of Birth: 03/10/1941 Attending MD: Norvel Richards , MD CSN: [id number] Age: 80 Admit Type: Outpatient Procedure:                Colonoscopy Indications:              Hematochezia, Abnormal CT of the GI tract Providers:                Norvel Richards, MD, Crystal Page, Raphael Gibney, Technician Referring MD:              Medicines:                Propofol per Anesthesia Complications:            No immediate complications. Estimated Blood Loss:     Estimated blood loss was minimal. Procedure:                Pre-Anesthesia Assessment:                           - Prior to the procedure, a History and Physical                            was performed, and patient medications and                            allergies were reviewed. The patient's tolerance of                            previous anesthesia was also reviewed. The risks                            and benefits of the procedure and the sedation                            options and risks were discussed with the patient.                            All questions were answered, and informed consent                            was obtained. Prior Anticoagulants: The patient                            last took Eliquis (apixaban) 2 days prior to the                            procedure. ASA Grade Assessment: IV - A patient                            with severe systemic disease that is a constant  threat to life. After reviewing the risks and                            benefits, the patient was deemed in satisfactory                            condition to undergo the procedure.                           After obtaining informed consent, the colonoscope                            was passed under direct vision. Throughout the                            procedure, the  patient's blood pressure, pulse, and                            oxygen saturations were monitored continuously. The                            [id number]-[id number] ([id number]) scope was introduced through                            the anus and advanced to the 5 cm into the ileum.                            The colonoscopy was performed without difficulty.                            The patient tolerated the procedure well. The                            quality of the bowel preparation was adequate. Scope In: 11:48:52 AM Scope Out: 12:04:33 PM Scope Withdrawal Time: 0 hours 13 minutes 32 seconds  Total Procedure Duration: 0 hours 15 minutes 41 seconds  Findings:      The perianal and digital rectal examinations were normal.      Scattered medium-mouthed diverticula were found in the sigmoid colon.      Two semi-pedunculated polyps were found in the sigmoid colon and cecum.       The polyps were 3 to 9 mm in size. These polyps were removed with a cold       snare. Resection and retrieval were complete. Estimated blood loss was       minimal.      Small cluster of AVMs in the base the cecum. They were ablated with the       APC utilizing circular probe at 20 J.      The exam was otherwise without abnormality on direct and retroflexion       views. Distal 5 cm of terminal ileum appeared normal Impression:               - Diverticulosis in the sigmoid colon.                           -  Two 3 to 9 mm polyps in the sigmoid colon and in                            the cecum, removed with a cold snare. Resected and                            retrieved. Cecal AVMs ablated as described above.                           - The examination was otherwise normal on direct                            and retroflexion views. Normal-appearing terminal                            ileum. Moderate Sedation:      Moderate (conscious) sedation was personally administered by an       anesthesia professional. The following  parameters were monitored: oxygen       saturation, heart rate, blood pressure, respiratory rate, EKG, adequacy       of pulmonary ventilation, and response to care. Recommendation:           - Patient has a contact number available for                            emergencies. The signs and symptoms of potential                            delayed complications were discussed with the                            patient. Return to normal activities tomorrow.                            Written discharge instructions were provided to the                            patient.                           - Resume previous diet.                           - Continue present medications.                           - Repeat colonoscopy after studies are complete for                            surveillance based on pathology results.                           - Return to GI office (date not yet determined).  The EGD report. Procedure Code(s):        --- Professional ---                           [id number], Colonoscopy, flexible; with removal of                            tumor(s), polyp(s), or other lesion(s) by snare                            technique Diagnosis Code(s):        --- Professional ---                           K63.5, Polyp of colon                           K92.1, Melena (includes Hematochezia)                           K57.30, Diverticulosis of large intestine without                            perforation or abscess without bleeding                           R93.3, Abnormal findings on diagnostic imaging of                            other parts of digestive tract CPT copyright 2019 American Medical Association. All rights reserved. The codes documented in this report are preliminary and upon coder review may  be revised to meet current compliance requirements. [name]. [name], MD Norvel Richards, MD 08/27/2020 12:15:58 PM This report has been signed  electronically. Number of Addenda: 0

## 2020-08-27 NOTE — Discharge Instructions (Signed)
EGD Discharge instructions Please read the instructions outlined below and refer to this sheet in the next few weeks. These discharge instructions provide you with general information on caring for yourself after you leave the hospital. Your doctor may also give you specific instructions. While your treatment has been planned according to the most current medical practices available, unavoidable complications occasionally occur. If you have any problems or questions after discharge, please call your doctor. ACTIVITY  You may resume your regular activity but move at a slower pace for the next 24 hours.   Take frequent rest periods for the next 24 hours.   Walking will help expel (get rid of) the air and reduce the bloated feeling in your abdomen.   No driving for 24 hours (because of the anesthesia (medicine) used during the test).   You may shower.   Do not sign any important legal documents or operate any machinery for 24 hours (because of the anesthesia used during the test).  NUTRITION  Drink plenty of fluids.   You may resume your normal diet.   Begin with a light meal and progress to your normal diet.   Avoid alcoholic beverages for 24 hours or as instructed by your caregiver.  MEDICATIONS  You may resume your normal medications unless your caregiver tells you otherwise.  WHAT YOU CAN EXPECT TODAY  You may experience abdominal discomfort such as a feeling of fullness or "gas" pains.  FOLLOW-UP  Your doctor will discuss the results of your test with you.  SEEK IMMEDIATE MEDICAL ATTENTION IF ANY OF THE FOLLOWING OCCUR:  Excessive nausea (feeling sick to your stomach) and/or vomiting.   Severe abdominal pain and distention (swelling).   Trouble swallowing.   Temperature over 101 F (37.8 C).   Rectal bleeding or vomiting of blood.    Colonoscopy Discharge Instructions  Read the instructions outlined below and refer to this sheet in the next few weeks. These  discharge instructions provide you with general information on caring for yourself after you leave the hospital. Your doctor may also give you specific instructions. While your treatment has been planned according to the most current medical practices available, unavoidable complications occasionally occur. If you have any problems or questions after discharge, call Dr. Gala Romney at [phone]. ACTIVITY  You may resume your regular activity, but move at a slower pace for the next 24 hours.   Take frequent rest periods for the next 24 hours.   Walking will help get rid of the air and reduce the bloated feeling in your belly (abdomen).   No driving for 24 hours (because of the medicine (anesthesia) used during the test).    Do not sign any important legal documents or operate any machinery for 24 hours (because of the anesthesia used during the test).  NUTRITION  Drink plenty of fluids.   You may resume your normal diet as instructed by your doctor.   Begin with a light meal and progress to your normal diet. Heavy or fried foods are harder to digest and may make you feel sick to your stomach (nauseated).   Avoid alcoholic beverages for 24 hours or as instructed.  MEDICATIONS  You may resume your normal medications unless your doctor tells you otherwise.  WHAT YOU CAN EXPECT TODAY  Some feelings of bloating in the abdomen.   Passage of more gas than usual.   Spotting of blood in your stool or on the toilet paper.  IF YOU HAD POLYPS REMOVED DURING THE  COLONOSCOPY:  No aspirin products for 7 days or as instructed.   No alcohol for 7 days or as instructed.   Eat a soft diet for the next 24 hours.  FINDING OUT THE RESULTS OF YOUR TEST Not all test results are available during your visit. If your test results are not back during the visit, make an appointment with your caregiver to find out the results. Do not assume everything is normal if you have not heard from your caregiver or the  medical facility. It is important for you to follow up on all of your test results.  SEEK IMMEDIATE MEDICAL ATTENTION IF:  You have more than a spotting of blood in your stool.   Your belly is swollen (abdominal distention).   You are nauseated or vomiting.   You have a temperature over 101.   You have abdominal pain or discomfort that is severe or gets worse throughout the d   Upper GI tract look good except for a small hiatal hernia  2 polyps removed in your colon.  You do have diverticulosis on the left side new  Blood blisters in your colon which could have caused some bleeding treated  Further recommendations to follow pending review of pathology report  At patient request I called Kathy at [phone] to reach.

## 2020-08-27 NOTE — Op Note (Signed)
[facility] Patient Name: Derrick Moon Procedure Date: 08/27/2020 12:09 PM MRN: [id number] Date of Birth: 1941-02-04 Attending MD: Norvel Richards , MD CSN: [id number] Age: 80 Admit Type: Outpatient Procedure:                Upper GI endoscopy Indications:              Iron deficiency anemia Providers:                Norvel Richards, MD, Crystal Page, Raphael Gibney, Technician Referring MD:              Medicines:                Propofol per Anesthesia Complications:            No immediate complications. Estimated Blood Loss:     Estimated blood loss: none. Procedure:                Pre-Anesthesia Assessment:                           - Prior to the procedure, a History and Physical                            was performed, and patient medications and                            allergies were reviewed. The patient's tolerance of                            previous anesthesia was also reviewed. The risks                            and benefits of the procedure and the sedation                            options and risks were discussed with the patient.                            All questions were answered, and informed consent                            was obtained. Prior Anticoagulants: The patient                            last took Eliquis (apixaban) 2 days prior to the                            procedure. ASA Grade Assessment: IV - A patient                            with severe systemic disease that is a constant  threat to life. After reviewing the risks and                            benefits, the patient was deemed in satisfactory                            condition to undergo the procedure.                           After obtaining informed consent, the endoscope was                            passed under direct vision. Throughout the                            procedure, the patient's blood  pressure, pulse, and                            oxygen saturations were monitored continuously. The                            [id number]-H190 ([id number]) scope was introduced through the                            mouth, and advanced to the second part of duodenum.                            The upper GI endoscopy was accomplished without                            difficulty. The patient tolerated the procedure                            well. The upper GI endoscopy was accomplished                            without difficulty. The patient tolerated the                            procedure well. Scope In: 12:10:49 PM Scope Out: 12:14:08 PM Total Procedure Duration: 0 hours 3 minutes 19 seconds  Findings:      The examined esophagus was normal.      A small hiatal hernia was present. Stomach appeared normal otherwise.      The duodenal bulb and second portion of the duodenum were normal.       Estimated blood loss: none. Impression:               - Normal esophagus.                           - Small hiatal hernia. Wise, normal stomach.                           - Normal duodenal bulb and second portion of the  duodenum.                           - No specimens collected. Moderate Sedation:      Moderate (conscious) sedation was personally administered by an       anesthesia professional. The following parameters were monitored: oxygen       saturation, heart rate, blood pressure, respiratory rate, EKG, adequacy       of pulmonary ventilation, and response to care. Recommendation:           - Patient has a contact number available for                            emergencies. The signs and symptoms of potential                            delayed complications were discussed with the                            patient. Return to normal activities tomorrow.                            Written discharge instructions were provided to the                             patient.                           - Resume previous diet.                           - Continue present medications.                           - Await pathology results.                           - Return to my office (date not yet determined).                            Colonoscopy report. Resume Eliquis tomorrow. Procedure Code(s):        --- Professional ---                           [phone], Esophagogastroduodenoscopy, flexible,                            transoral; diagnostic, including collection of                            specimen(s) by brushing or washing, when performed                            (separate procedure) Diagnosis Code(s):        --- Professional ---  K44.9, Diaphragmatic hernia without obstruction or                            gangrene                           D50.9, Iron deficiency anemia, unspecified CPT copyright 2019 American Medical Association. All rights reserved. The codes documented in this report are preliminary and upon coder review may  be revised to meet current compliance requirements. [name]. Tonio Seider, MD Norvel Richards, MD 08/27/2020 12:19:07 PM This report has been signed electronically. Number of Addenda: 0

## 2020-08-27 NOTE — H&P (Signed)
@LOGO @   Primary Care Physician:  [name], MD Primary Gastroenterologist:  Dr. Gala Romney  Pre-Procedure History & Physical: HPI:  Derrick Moon is a 80 y.o. male here for further evaluation of rectal bleeding earlier this year and abnormal colon on CT, low iron saturation low but now improving ferritin (68).  Patient is here for colonoscopy and possible EGD  He is devoid of any upper GI tract symptoms.  Past Medical History:  Diagnosis Date  . AAA (abdominal aortic aneurysm) ([place])   . Anxiety    due to pain in leg  . Arrhythmia   . Arthritis    Osteo  . Bell's palsy   . Cancer [facility])    Bladder cancer  . Cataracts, bilateral   . CHF (congestive heart failure) ([place])   . COPD (chronic obstructive pulmonary disease) ([facility])    O2 supplementation dependent  . Diverticulitis   . Enlarged prostate    takes Flomax daily  . GERD (gastroesophageal reflux disease)   . Headache    migraines years ago  . History of bronchitis    > 75yrs ago  . History of colon polyps    benign  . History of dizziness    several yrs ago-was on Meclizine but has been off for several yrs-no problems since  . History of hiatal hernia   . Hypertension    takes Amlodipine and Lisinpril daily  . Joint pain   . Joint swelling   . Nocturia   . Oxygen deficiency    2 1/2 L  . Oxygen dependent    2 1/2 L  . PAD (peripheral artery disease) ([facility])     bilateral superficial femoral artery occlusions   . Paroxysmal atrial fibrillation ([facility])   . Pneumonia    several times with most recent within 5 yrs ago  . Restless leg syndrome   . Rupture of left quadriceps tendon   . Shortness of breath dyspnea    with exertion  . Sleep apnea    does not use cpap  . Urinary urgency     Past Surgical History:  Procedure Laterality Date  . BICEPS TENDON REPAIR     LEFT  . CARDIOVERSION N/A 02/06/2017   Procedure: CARDIOVERSION;  Surgeon: Jerline Pain, MD;  Location: [facility] ENDOSCOPY;  Service: Cardiovascular;   Laterality: N/A;  . CARPAL TUNNEL RELEASE Right 2014  . CHOLECYSTECTOMY  July 16,2010  . COLONOSCOPY  2003   6 polyps, left sided diverticula. Path with hyperplastic polyps per review of colonoscopy report in 2011.   [name] COLONOSCOPY  03/2009   Scattered left sided diverticula.   . CYSTOSCOPY    . ESOPHAGOGASTRODUODENOSCOPY    . FEMORAL-POPLITEAL BYPASS GRAFT Right 09/19/2015   Procedure: RIGHT COMMON FEMORAL-ABOVE KNEE POPLITEAL ARTERY    BYPASS GRAFT ;  Surgeon: Conrad North Shore, MD;  Location: [place];  Service: Vascular;  Laterality: Right;  . FOOT SURGERY Right    x 5t  . HAND SURGERY Right   . HERNIA REPAIR Bilateral    inguinal  . PERIPHERAL VASCULAR CATHETERIZATION N/A 08/02/2015   Procedure: Abdominal Aortogram w/Lower Extremity;  Surgeon: Conrad Gregory, MD;  Location: [place] [facility];  Service: Cardiovascular;  Laterality: N/A;  . QUADRICEPS TENDON REPAIR Left 08/22/2016   Procedure: LEFT OPEN QUADRICEP TENDON REPAIR;  Surgeon: Netta Cedars, MD;  Location: [place];  Service: Orthopedics;  Laterality: Left;  . REVERSE SHOULDER ARTHROPLASTY Right 07/07/2014   Procedure: RIGHT REVERSE TOTAL SHOULDER ;  Surgeon: Netta Cedars, MD;  Location: [place];  Service: Orthopedics;  Laterality: Right;  . REVERSE TOTAL SHOULDER ARTHROPLASTY Right 07/07/2014   Dr Veverly Fells  . ROTATOR CUFF REPAIR     Bilateral   . TEE WITHOUT CARDIOVERSION N/A 02/06/2017   Procedure: TRANSESOPHAGEAL ECHOCARDIOGRAM (TEE);  Surgeon: Jerline Pain, MD;  Location[facility]COPY;  Service: Cardiovascular;  Laterality: N/A;  . TONSILLECTOMY    . tumor removed from bladder    . VEIN HARVEST Right 09/19/2015   Procedure: WITH NON REVERSE RIGHT GREATER SAPHENOUS VEIN HARVEST;  Surgeon: Conrad Parker, MD;  Location: [place];  Service: Vascular;  Laterality: Right;    Prior to Admission medications   Medication Sig Start Date End Date Taking? Authorizing Provider  albuterol (PROVENTIL HFA;VENTOLIN HFA) 108 (90 BASE) MCG/ACT inhaler  Inhale 1-2 puffs into the lungs every 6 (six) hours as needed for wheezing or shortness of breath. Reported on 10/03/2015   Yes [provider]  apixaban (ELIQUIS) 5 MG TABS tablet Take 1 tablet (5 mg total) 2 (two) times daily by mouth. 02/08/17  Yes Mikhail, Velta Addison, DO  azithromycin (ZITHROMAX) 250 MG tablet Take 250 mg by mouth every Monday, Wednesday, and Friday. 07/23/20  Yes [provider]  bisacodyl (DULCOLAX) 5 MG EC tablet Take 5 mg by mouth daily as needed for moderate constipation.   Yes [provider]  diphenhydrAMINE (BENADRYL) 25 MG tablet Take 25 mg by mouth daily as needed for itching or allergies.   Yes [provider]  famotidine (PEPCID) 20 MG tablet TAKE (1) TABLET BY MOUTH AT BEDTIME. Patient taking differently: Take 20 mg by mouth at bedtime. TAKE (1) TABLET BY MOUTH AT BEDTIME. 04/19/19  Yes Tanda Rockers, MD  Fluticasone-Umeclidin-Vilant (TRELEGY ELLIPTA) 100-62.5-25 MCG/INH AEPB Inhale 1 puff into the lungs daily. 05/09/20  Yes Spero Geralds, MD  guaiFENesin (MUCINEX) 600 MG 12 hr tablet Take 600 mg by mouth 2 (two) times daily.   Yes [provider]  ipratropium-albuterol (DUONEB) 0.5-2.5 (3) MG/3ML SOLN Take 3 mLs by nebulization every 4 (four) hours as needed. 03/14/19  Yes Emokpae, Courage, MD  levocetirizine (XYZAL) 5 MG tablet Take 5 mg by mouth at bedtime.   Yes [provider]  losartan (COZAAR) 50 MG tablet Take 50 mg by mouth at bedtime. 06/19/20  Yes [provider]  metoprolol tartrate (LOPRESSOR) 25 MG tablet Take 0.5 tablets (12.5 mg total) by mouth 2 (two) times daily. Patient taking differently: Take 12.5 mg by mouth daily. 03/14/19  Yes Emokpae, Courage, MD  montelukast (SINGULAIR) 10 MG tablet TAKE (1) TABLET BY MOUTH AT BEDTIME. Patient taking differently: Take 10 mg by mouth at bedtime. 08/12/18  Yes Tanda Rockers, MD  pantoprazole (PROTONIX) 40 MG tablet TAKE 1 TABLET BY MOUTH 30 TO 60  MINUTES PRIOR TO THE FIRST MEAL OF THE DAY. Patient taking differently: Take 40 mg by mouth daily before breakfast. 09/15/18  Yes Tanda Rockers, MD  potassium chloride (KLOR-CON) 20 MEQ tablet Take 1 tablet (20 mEq total) by mouth 2 (two) times daily. Patient taking differently: Take 10 mEq by mouth 2 (two) times daily. 06/18/19 12/27/19 Yes Shah, Pratik D, DO  predniSONE (DELTASONE) 10 MG tablet Take by mouth. 07/19/20  Yes [provider]  Tamsulosin HCl (FLOMAX) 0.4 MG CAPS Take 0.4 mg by mouth daily after supper.    Yes [provider]  torsemide (DEMADEX) 20 MG tablet Take 2 tablets (40 mg total) by  mouth daily. Patient taking differently: Take 40 mg by mouth daily as needed (fluid, swelling). 06/18/19 12/27/19 Yes Shah, Pratik D, DO  acetaminophen (TYLENOL) 500 MG tablet Take 1,000 mg by mouth as needed (for pain or headaches). Patient not taking: Reported on 08/16/2020    [provider]  amLODipine (NORVASC) 5 MG tablet Take 5 mg by mouth daily. 12/06/19   [provider]  azithromycin (ZITHROMAX) 500 MG tablet Take 0.5 tablets (250 mg total) by mouth every Monday, Wednesday, and Friday. Patient not taking: No sig reported 07/23/20   Spero Geralds, MD  docusate sodium (COLACE) 100 MG capsule Take 1 capsule (100 mg total) by mouth 2 (two) times daily. Patient not taking: No sig reported 01/04/18   Lavina Hamman, MD  hydrOXYzine (ATARAX/VISTARIL) 25 MG tablet Take 1 tablet (25 mg total) by mouth at bedtime. Patient not taking: Reported on 08/16/2020 06/19/19   Heath Lark D, DO  polyethylene glycol-electrolytes (NULYTELY) 420 g solution As directed 07/05/20   Daneil Dolin, MD    Allergies as of 07/05/2020 - Review Complete 06/22/2020  Allergen Reaction Noted  . Adhesive [tape] Other (See Comments) 12/31/2017  . Aspartame and phenylalanine Nausea Only 07/07/2014  . Other Rash 09/19/2015  . Sulfonamide derivatives Nausea And Vomiting     Family History   Problem Relation Age of Onset  . Parkinson's disease Mother   . AAA (abdominal aortic aneurysm) Father   . Heart defect Sister        Congenital Heart Disease  . Heart disease Sister     Social History   Socioeconomic History  . Marital status: Married    Spouse name: Not on file  . Number of children: Not on file  . Years of education: Not on file  . Highest education level: Associate degree: occupational, Hotel manager, or vocational program  Occupational History  . Not on file  Tobacco Use  . Smoking status: Current Some Day Smoker    Packs/day: 0.25    Years: 45.00    Pack years: 11.25    Types: Cigars    Last attempt to quit: 01/07/2019    Years since quitting: 1.6  . Smokeless tobacco: Never Used  . Tobacco comment: smokes 1/3 of a cigar a day- 12/27/2019  Vaping Use  . Vaping Use: Never used  Substance and Sexual Activity  . Alcohol use: Yes    Alcohol/week: 0.0 standard drinks    Comment: wine occasionally  . Drug use: Not Currently    Types: Marijuana  . Sexual activity: Yes  Other Topics Concern  . Not on file  Social History Narrative   Pt lives w/ wife in [place], [place]   Social Determinants of Health   Financial Resource Strain: Not on file  Food Insecurity: Not on file  Transportation Needs: Not on file  Physical Activity: Not on file  Stress: Not on file  Social Connections: Not on file  Intimate Partner Violence: Not on file    Review of Systems: See HPI, otherwise negative ROS  Physical Exam: Pulse (P) 67   Temp (P) 97.8 F (36.6 C) (Oral)   Resp (P) 19   SpO2 (P) 99%  General:   Alert,  Well-developed, well-nourished, pleasant and cooperative in NAD Neck:  Supple; no masses or thyromegaly. No significant cervical adenopathy. Lungs:  Clear throughout to auscultation.   No wheezes, crackles, or rhonchi. No acute distress. Heart:  Regular rate and rhythm; no murmurs, clicks, rubs,  or  gallops. Abdomen: Non-distended, normal bowel sounds.   Soft and nontender without appreciable mass or hepatosplenomegaly.  Pulses:  Normal pulses noted. Extremities:  Without clubbing or edema.  Impression/Plan: 80 year old gentleman with significant COPD abnormal colon on CT prior rectal bleeding and iron deficiency here for colonoscopy possible EGD  I have offered the patient a colonoscopy and possible EGD to further evaluate the above symptoms and lab findings.  The risks, benefits, limitations, imponderables and alternatives regarding both EGD and colonoscopy have been reviewed with the patient. Questions have been answered. All parties agreeable.      Notice: This dictation was prepared with Dragon dictation along with smaller phrase technology. Any transcriptional errors that result from this process are unintentional and may not be corrected upon review.

## 2020-08-27 NOTE — Transfer of Care (Signed)
Immediate Anesthesia Transfer of Care Note  Patient: Derrick Moon  Procedure(s) Performed: COLONOSCOPY WITH PROPOFOL (N/A ) ESOPHAGOGASTRODUODENOSCOPY (EGD) WITH PROPOFOL (N/A ) POLYPECTOMY INTESTINAL HOT HEMOSTASIS (ARGON PLASMA COAGULATION/BICAP)  Patient Location: PACU  Anesthesia Type:General  Level of Consciousness: awake, alert  and oriented  Airway & Oxygen Therapy: Patient Spontanous Breathing and Patient connected to nasal cannula oxygen  Post-op Assessment: Report given to RN, Post -op Vital signs reviewed and stable and Patient moving all extremities X 4  Post vital signs: Reviewed and stable  Last Vitals:  Vitals Value Taken Time  BP    Temp    Pulse    Resp    SpO2      Last Pain:  Vitals:   08/27/20 1136  TempSrc:   PainSc: 2          Complications: No complications documented.

## 2020-08-27 NOTE — Anesthesia Preprocedure Evaluation (Signed)
Anesthesia Evaluation  Patient identified by MRN, date of birth, ID band Patient awake    Reviewed: Allergy & Precautions, NPO status , Patient's Chart, lab work & pertinent test results  History of Anesthesia Complications Negative for: history of anesthetic complications  Airway Mallampati: III  TM Distance: >3 FB Neck ROM: Full    Dental  (+) Edentulous Upper, Missing   Pulmonary shortness of breath, with exertion, at rest and Long-Term Oxygen Therapy, asthma , sleep apnea , pneumonia, COPD,  oxygen dependent, Current Smoker and Patient abstained from smoking.,    Pulmonary exam normal breath sounds clear to auscultation       Cardiovascular Exercise Tolerance: Poor hypertension, Pt. on medications + Peripheral Vascular Disease, +CHF and + DOE  Normal cardiovascular exam+ dysrhythmias  Rhythm:Regular Rate:Normal     Neuro/Psych  Headaches, Anxiety  Neuromuscular disease (RLS)    GI/Hepatic Neg liver ROS, hiatal hernia, GERD  Medicated and Controlled,  Endo/Other  negative endocrine ROS  Renal/GU negative Renal ROS     Musculoskeletal  (+) Arthritis ,   Abdominal   Peds  Hematology  (+) anemia ,   Anesthesia Other Findings   Reproductive/Obstetrics                           Anesthesia Physical Anesthesia Plan  ASA: IV  Anesthesia Plan: General   Post-op Pain Management:    Induction: Intravenous  PONV Risk Score and Plan: Propofol infusion  Airway Management Planned: Nasal Cannula and Natural Airway  Additional Equipment:   Intra-op Plan:   Post-operative Plan:   Informed Consent: I have reviewed the patients History and Physical, chart, labs and discussed the procedure including the risks, benefits and alternatives for the proposed anesthesia with the patient or authorized representative who has indicated his/her understanding and acceptance.       Plan Discussed  with: CRNA and Surgeon  Anesthesia Plan Comments:         Anesthesia Quick Evaluation

## 2020-08-28 ENCOUNTER — Encounter: Payer: Self-pay | Admitting: Internal Medicine

## 2020-08-28 LAB — SURGICAL PATHOLOGY

## 2020-09-03 ENCOUNTER — Telehealth: Payer: Self-pay | Admitting: Internal Medicine

## 2020-09-03 NOTE — Telephone Encounter (Signed)
Patient called and said that he has been in pain since his procedure and wanted to know who the anestesiologist was.  I told him that the hospital would need to be called with that question.  Please call him about his pain

## 2020-09-03 NOTE — Telephone Encounter (Signed)
Spoke with patient and his wife. Little to no pain today. The first couple days after his procedure, he was having pretty severe pain. Has continued to eat normally and bowels are moving well without rectal bleeding. No nausea, vomiting, fever, or chills. Query whether he was having discomfort related to procedure. He had polypectomy and APC therapy. As abdominal pain has resolved, he will continue to monitor and let us know if this returns.   Regarding reports of blood-tinged emesis that was reported to the nurse, this was actually phlegm he coughed up. Advised he call his PCP to follow-up on this.

## 2020-09-03 NOTE — Telephone Encounter (Signed)
Called pt back.  He reports that he has been having lower abdominal pain after the procedure he had last Monday.  Says that the pain is better today than it was last week.  No diarrhea, constipation, or rectal bleeding.  BM' s have been normal.  Last one yesterday.  He reports that his emesis had a tinge of blood to it last night.  Routing to [employer], [place] in Dr. Roseanne Kaufman absence.

## 2020-09-04 ENCOUNTER — Encounter (HOSPITAL_COMMUNITY): Payer: Self-pay | Admitting: Internal Medicine

## 2020-09-20 DIAGNOSIS — J449 Chronic obstructive pulmonary disease, unspecified: Secondary | ICD-10-CM | POA: Diagnosis not present

## 2020-09-20 DIAGNOSIS — I10 Essential (primary) hypertension: Secondary | ICD-10-CM | POA: Diagnosis not present

## 2020-10-15 DIAGNOSIS — Z79899 Other long term (current) drug therapy: Secondary | ICD-10-CM | POA: Diagnosis not present

## 2020-10-15 DIAGNOSIS — J9611 Chronic respiratory failure with hypoxia: Secondary | ICD-10-CM | POA: Diagnosis not present

## 2020-10-15 DIAGNOSIS — I10 Essential (primary) hypertension: Secondary | ICD-10-CM | POA: Diagnosis not present

## 2020-10-15 DIAGNOSIS — D508 Other iron deficiency anemias: Secondary | ICD-10-CM | POA: Diagnosis not present

## 2020-10-21 DIAGNOSIS — J9611 Chronic respiratory failure with hypoxia: Secondary | ICD-10-CM | POA: Diagnosis not present

## 2020-10-21 DIAGNOSIS — I10 Essential (primary) hypertension: Secondary | ICD-10-CM | POA: Diagnosis not present

## 2020-10-22 DIAGNOSIS — J9611 Chronic respiratory failure with hypoxia: Secondary | ICD-10-CM | POA: Diagnosis not present

## 2020-10-22 DIAGNOSIS — I5032 Chronic diastolic (congestive) heart failure: Secondary | ICD-10-CM | POA: Diagnosis not present

## 2020-10-22 DIAGNOSIS — I10 Essential (primary) hypertension: Secondary | ICD-10-CM | POA: Diagnosis not present

## 2020-11-20 ENCOUNTER — Other Ambulatory Visit: Payer: Self-pay | Admitting: Internal Medicine

## 2020-11-20 DIAGNOSIS — B351 Tinea unguium: Secondary | ICD-10-CM | POA: Diagnosis not present

## 2020-11-20 DIAGNOSIS — J418 Mixed simple and mucopurulent chronic bronchitis: Secondary | ICD-10-CM

## 2020-11-20 DIAGNOSIS — L851 Acquired keratosis [keratoderma] palmaris et plantaris: Secondary | ICD-10-CM | POA: Diagnosis not present

## 2020-11-20 DIAGNOSIS — I739 Peripheral vascular disease, unspecified: Secondary | ICD-10-CM | POA: Diagnosis not present

## 2020-11-21 DIAGNOSIS — I10 Essential (primary) hypertension: Secondary | ICD-10-CM | POA: Diagnosis not present

## 2020-11-21 DIAGNOSIS — J449 Chronic obstructive pulmonary disease, unspecified: Secondary | ICD-10-CM | POA: Diagnosis not present

## 2020-12-17 DIAGNOSIS — I10 Essential (primary) hypertension: Secondary | ICD-10-CM | POA: Diagnosis not present

## 2020-12-17 DIAGNOSIS — J449 Chronic obstructive pulmonary disease, unspecified: Secondary | ICD-10-CM | POA: Diagnosis not present

## 2020-12-31 ENCOUNTER — Telehealth: Payer: Self-pay | Admitting: Internal Medicine

## 2020-12-31 NOTE — Telephone Encounter (Signed)
Told them they needed to contact pharmacy.Derrick Moon"

## 2020-12-31 NOTE — Telephone Encounter (Signed)
Called and spoke to patient who states that Trelegy is not working for him. He notified the staff at Dr. Beverlee Nims (PCP) office and per patient they suggested symbicort. Patient is asking that we send in a prescription for symbicort. Told the patient I would check in with the provider he sees with pulmonary and see if this could be arranged.   Dr. Shearon Stalls please advise if you're okay with this change?   Thanks!

## 2021-01-01 MED ORDER — BUDESONIDE-FORMOTEROL FUMARATE 160-4.5 MCG/ACT IN AERO: 2.0000 | INHALATION_SPRAY | Freq: Two times a day (BID) | RESPIRATORY_TRACT | 6 refills | Status: DC

## 2021-01-01 NOTE — Telephone Encounter (Signed)
Fine to change. Please make sure he knows that this is a down step in therapy. Trelegy has three medications, symbicort only has two.

## 2021-01-01 NOTE — Telephone Encounter (Signed)
I have called and spoke with pt and he is aware of ND recs.  He is aware that the symbicort is only 2 meds and he stated he wanted to try this.  Med list has been updated.

## 2021-01-11 DIAGNOSIS — R062 Wheezing: Secondary | ICD-10-CM | POA: Diagnosis not present

## 2021-01-11 DIAGNOSIS — J479 Bronchiectasis, uncomplicated: Secondary | ICD-10-CM | POA: Diagnosis not present

## 2021-01-11 DIAGNOSIS — J441 Chronic obstructive pulmonary disease with (acute) exacerbation: Secondary | ICD-10-CM | POA: Diagnosis not present

## 2021-01-11 DIAGNOSIS — J9620 Acute and chronic respiratory failure, unspecified whether with hypoxia or hypercapnia: Secondary | ICD-10-CM | POA: Diagnosis not present

## 2021-01-15 DIAGNOSIS — I10 Essential (primary) hypertension: Secondary | ICD-10-CM | POA: Diagnosis not present

## 2021-01-15 DIAGNOSIS — J449 Chronic obstructive pulmonary disease, unspecified: Secondary | ICD-10-CM | POA: Diagnosis not present

## 2021-01-15 DIAGNOSIS — Z79899 Other long term (current) drug therapy: Secondary | ICD-10-CM | POA: Diagnosis not present

## 2021-01-16 ENCOUNTER — Ambulatory Visit (INDEPENDENT_AMBULATORY_CARE_PROVIDER_SITE_OTHER)
Admission: RE | Admit: 2021-01-16 | Discharge: 2021-01-16 | Disposition: A | Discharge status: Home / Self Care | Payer: PPO | Source: Ambulatory Visit | Attending: Vascular Surgery | Admitting: Vascular Surgery

## 2021-01-16 ENCOUNTER — Ambulatory Visit: Payer: PPO | Admitting: Physician Assistant

## 2021-01-16 ENCOUNTER — Ambulatory Visit (HOSPITAL_COMMUNITY)
Admission: RE | Admit: 2021-01-16 | Discharge: 2021-01-16 | Disposition: A | Discharge status: Home / Self Care | Payer: PPO | Source: Ambulatory Visit | Attending: Vascular Surgery | Admitting: Vascular Surgery

## 2021-01-16 ENCOUNTER — Other Ambulatory Visit: Payer: Self-pay

## 2021-01-16 VITALS — BP 119/68 | HR 63 | Temp 98.2°F | Resp 20 | Ht 71.0 in | Wt 223.0 lb

## 2021-01-16 DIAGNOSIS — I739 Peripheral vascular disease, unspecified: Secondary | ICD-10-CM | POA: Diagnosis not present

## 2021-01-16 DIAGNOSIS — I714 Abdominal aortic aneurysm, without rupture, unspecified: Secondary | ICD-10-CM

## 2021-01-16 NOTE — Progress Notes (Signed)
[facility] [place] HISTORY AND PHYSICAL   History of Present Illness:  Patient is a 80 y.o. year old male who presents for evaluation of PAD.  S/P right femoral-popliteal artery bypass by Dr. Bridgett Larsson.   He was also followed for abdominal aortic aneurysm.   He denise non healing wounds, claudication symptoms or rest pain.  He denise abdominal or lumbar pain.   His mobility is limited to his COPD and O2 needs 24 hours a day.  He continues to smoke [place] and Milds.       Past Medical History:  Diagnosis Date   AAA (abdominal aortic aneurysm)    Anxiety    due to pain in leg   Arrhythmia    Arthritis    Osteo   Bell's palsy    Cancer (HCC)    Bladder cancer   Cataracts, bilateral    CHF (congestive heart failure) (HCC)    COPD (chronic obstructive pulmonary disease) (HCC)    O2 supplementation dependent   Diverticulitis    Enlarged prostate    takes Flomax daily   GERD (gastroesophageal reflux disease)    Headache    migraines years ago   History of bronchitis    > 29yrs ago   History of colon polyps    benign   History of dizziness    several yrs ago-was on Meclizine but has been off for several yrs-no problems since   History of hiatal hernia    Hypertension    takes Amlodipine and Lisinpril daily   Joint pain    Joint swelling    Nocturia    Oxygen deficiency    2 1/2 L   Oxygen dependent    2 1/2 L   PAD (peripheral artery disease) (HCC)     bilateral superficial femoral artery occlusions    Paroxysmal atrial fibrillation (HCC)    Pneumonia    several times with most recent within 5 yrs ago   Restless leg syndrome    Rupture of left quadriceps tendon    Shortness of breath dyspnea    with exertion   Sleep apnea    does not use cpap   Urinary urgency     Past Surgical History:  Procedure Laterality Date   BICEPS TENDON REPAIR     LEFT   CARDIOVERSION N/A 02/06/2017   Procedure: CARDIOVERSION;  Surgeon: Jerline Pain, MD;  Location: [facility];  Service: Cardiovascular;  Laterality: N/A;   CARPAL TUNNEL RELEASE Right 2014   CHOLECYSTECTOMY  July 16,2010   COLONOSCOPY  2003   6 polyps, left sided diverticula. Path with hyperplastic polyps per review of colonoscopy report in 2011.    COLONOSCOPY  03/2009   Scattered left sided diverticula.    COLONOSCOPY WITH PROPOFOL N/A 08/27/2020   Procedure: COLONOSCOPY WITH PROPOFOL;  Surgeon: Daneil Dolin, MD;  [facility];  Service: Endoscopy;  Laterality: N/A;  11:00am   CYSTOSCOPY     ESOPHAGOGASTRODUODENOSCOPY     ESOPHAGOGASTRODUODENOSCOPY (EGD) WITH PROPOFOL N/A 08/27/2020   Procedure: ESOPHAGOGASTRODUODENOSCOPY (EGD) WITH PROPOFOL;  Surgeon: Daneil Dolin, MD;  [facility];  Service: Endoscopy;  Laterality: N/A;   FEMORAL-POPLITEAL BYPASS GRAFT Right 09/19/2015   Procedure: RIGHT COMMON FEMORAL-ABOVE KNEE POPLITEAL ARTERY    BYPASS GRAFT ;  Surgeon: Conrad [place], MD;  Location: [place];  Service: Vascular;  Laterality: Right;   FOOT SURGERY Right    x 5t   HAND SURGERY  Right    HERNIA REPAIR Bilateral    inguinal   HOT HEMOSTASIS  08/27/2020   Procedure: HOT HEMOSTASIS (ARGON PLASMA COAGULATION/BICAP);  Surgeon: Daneil Dolin, MD;  [facility];  Service: Endoscopy;;   PERIPHERAL VASCULAR CATHETERIZATION N/A 08/02/2015   Procedure: Abdominal Aortogram w/Lower Extremity;  Surgeon: Conrad [place], MD;  Location: [place] [facility];  Service: Cardiovascular;  Laterality: N/A;   POLYPECTOMY  08/27/2020   Procedure: POLYPECTOMY INTESTINAL;  Surgeon: Daneil Dolin, MD;  [facility];  Service: Endoscopy;;   QUADRICEPS TENDON REPAIR Left 08/22/2016   Procedure: LEFT OPEN QUADRICEP TENDON REPAIR;  Surgeon: Netta Cedars, MD;  Location: [place];  Service: Orthopedics;  Laterality: Left;   REVERSE SHOULDER ARTHROPLASTY Right 07/07/2014   Procedure: RIGHT REVERSE TOTAL SHOULDER ;  Surgeon: Netta Cedars, MD;  Location: [place];  Service: Orthopedics;   Laterality: Right;   REVERSE TOTAL SHOULDER ARTHROPLASTY Right 07/07/2014   Dr Veverly Fells   ROTATOR CUFF REPAIR     Bilateral    TEE WITHOUT CARDIOVERSION N/A 02/06/2017   Procedure: TRANSESOPHAGEAL ECHOCARDIOGRAM (TEE);  Surgeon: Jerline Pain, MD;  Location: [facility] ENDOSCOPY;  Service: Cardiovascular;  Laterality: N/A;   TONSILLECTOMY     tumor removed from bladder     VEIN HARVEST Right 09/19/2015   Procedure: WITH NON REVERSE RIGHT GREATER SAPHENOUS VEIN HARVEST;  Surgeon: Conrad [place], MD;  Location: [place];  Service: Vascular;  Laterality: Right;    ROS:   General:  No weight loss, Fever, chills  HEENT: No recent headaches, no nasal bleeding, no visual changes, no sore throat  Neurologic: No dizziness, blackouts, seizures. No recent symptoms of stroke or mini- stroke. No recent episodes of slurred speech, or temporary blindness.  Cardiac: No recent episodes of chest pain/pressure, no shortness of breath at rest.  No shortness of breath with exertion.  Denies history of atrial fibrillation or irregular heartbeat  Vascular: No history of rest pain in feet.  No history of claudication.  No history of non-healing ulcer, No history of DVT   Pulmonary: positive home oxygen, no productive cough, no hemoptysis,  No asthma or wheezing  Musculoskeletal:  [ ]  Arthritis, [ ]  Low back pain,  [ ]  Joint pain  Hematologic:No history of hypercoagulable state.  No history of easy bleeding.  No history of anemia  Gastrointestinal: No hematochezia or melena,  No gastroesophageal reflux, no trouble swallowing  Urinary: [ ]  chronic Kidney disease, [ ]  on HD - [ ]  MWF or [ ]  TTHS, [ ]  Burning with urination, [ ]  Frequent urination, [ ]  Difficulty urinating;   Skin: No rashes  Psychological: No history of anxiety,  No history of depression  Social History Social History   Tobacco Use   Smoking status: Some Days    Packs/day: 0.25    Years: 45.00    Pack years: 11.25    Types: Cigars, Cigarettes     Last attempt to quit: 01/07/2019    Years since quitting: 2.0   Smokeless tobacco: Never   Tobacco comments:    smokes 1/3 of a cigar a day- 12/27/2019  Vaping Use   Vaping Use: Never used  Substance Use Topics   Alcohol use: Yes    Alcohol/week: 0.0 standard drinks    Comment: wine occasionally   Drug use: Not Currently    Types: Marijuana    Family History Family History  Problem Relation Age of Onset   Parkinson's disease  Mother    AAA (abdominal aortic aneurysm) Father    Heart defect Sister        Congenital Heart Disease   Heart disease Sister     Allergies  Allergies  Allergen Reactions   Adhesive [Tape] Other (See Comments)    SKIN IS VERY THIN AND IT TEARS VERY EASILY!!! Please use an alternative   Aspartame And Phenylalanine Nausea Only   Other Rash    Pt reports rash on back, buttocks, shoulders and stomach after arteriogram   Sulfonamide Derivatives Nausea And Vomiting     Current Outpatient Medications  Medication Sig Dispense Refill   albuterol (PROVENTIL HFA;VENTOLIN HFA) 108 (90 BASE) MCG/ACT inhaler Inhale 1-2 puffs into the lungs every 6 (six) hours as needed for wheezing or shortness of breath. Reported on 10/03/2015     amLODipine (NORVASC) 5 MG tablet Take 5 mg by mouth daily.     apixaban (ELIQUIS) 5 MG TABS tablet Take 1 tablet (5 mg total) 2 (two) times daily by mouth. 60 tablet 0   azithromycin (ZITHROMAX) 250 MG tablet Take 250 mg by mouth every Monday, Wednesday, and Friday.     bisacodyl (DULCOLAX) 5 MG EC tablet Take 5 mg by mouth daily as needed for moderate constipation.     budesonide-formoterol (SYMBICORT) 160-4.5 MCG/ACT inhaler Inhale 2 puffs into the lungs in the morning and at bedtime. 1 each 6   diphenhydrAMINE (BENADRYL) 25 MG tablet Take 25 mg by mouth daily as needed for itching or allergies.     famotidine (PEPCID) 20 MG tablet TAKE (1) TABLET BY MOUTH AT BEDTIME. (Patient taking differently: Take 20 mg by mouth at bedtime.  TAKE (1) TABLET BY MOUTH AT BEDTIME.) 30 tablet 0   guaiFENesin (MUCINEX) 600 MG 12 hr tablet Take 600 mg by mouth 2 (two) times daily.     ipratropium-albuterol (DUONEB) 0.5-2.5 (3) MG/3ML SOLN Take 3 mLs by nebulization every 4 (four) hours as needed. 360 mL 0   levocetirizine (XYZAL) 5 MG tablet Take 5 mg by mouth at bedtime.     losartan (COZAAR) 50 MG tablet Take 50 mg by mouth at bedtime.     metoprolol tartrate (LOPRESSOR) 25 MG tablet Take 0.5 tablets (12.5 mg total) by mouth 2 (two) times daily. (Patient taking differently: Take 12.5 mg by mouth daily.) 30 tablet 3   montelukast (SINGULAIR) 10 MG tablet TAKE (1) TABLET BY MOUTH AT BEDTIME. (Patient taking differently: Take 10 mg by mouth at bedtime.) 30 tablet 1   pantoprazole (PROTONIX) 40 MG tablet TAKE 1 TABLET BY MOUTH 30 TO 60 MINUTES PRIOR TO THE FIRST MEAL OF THE DAY. (Patient taking differently: Take 40 mg by mouth daily before breakfast.) 30 tablet 0   polyethylene glycol-electrolytes (NULYTELY) 420 g solution As directed 4000 mL 0   potassium chloride (KLOR-CON) 20 MEQ tablet Take 1 tablet (20 mEq total) by mouth 2 (two) times daily. (Patient taking differently: Take 10 mEq by mouth 2 (two) times daily.) 60 tablet 1   predniSONE (DELTASONE) 10 MG tablet Take by mouth.     Tamsulosin HCl (FLOMAX) 0.4 MG CAPS Take 0.4 mg by mouth daily after supper.      torsemide (DEMADEX) 20 MG tablet Take 2 tablets (40 mg total) by mouth daily. (Patient taking differently: Take 40 mg by mouth daily as needed (fluid, swelling).) 60 tablet 2   No current facility-administered medications for this visit.    Physical Examination  Vitals:   01/16/21 1038  BP: 119/68  Pulse: 63  Resp: 20  Temp: 98.2 F (36.8 C)  TempSrc: Temporal  SpO2: 96%  Weight: 223 lb (101.2 kg)  Height: 5\' 11"  (1.803 m)  PF: (!) 2 L/min    Body mass index is 31.1 kg/m.  General:  Alert and oriented, no acute distress HEENT: Normal Neck: No bruit or  JVD Pulmonary: Clear to auscultation bilaterally Cardiac: Regular Rate and Rhythm without murmur Abdomen: Soft, non-tender, non-distended, no mass, no scars Skin: No rash Musculoskeletal: No deformity or edema  Neurologic: Upper and lower extremity motor 5/5 and symmetric  DATA:     Abdominal Aorta Findings:  +-----------+-------+----------+----------+--------+--------+--------+  Location   AP (cm)Trans (cm)PSV (cm/s)WaveformThrombusComments  +-----------+-------+----------+----------+--------+--------+--------+  Proximal   2.06   2.20      79                                  +-----------+-------+----------+----------+--------+--------+--------+  Mid        4.10   4.74      69                                  +-----------+-------+----------+----------+--------+--------+--------+  Distal     3.01   3.24      47                                  +-----------+-------+----------+----------+--------+--------+--------+  RT CIA Prox1.2    1.2       205                                 +-----------+-------+----------+----------+--------+--------+--------+  LT CIA Prox1.0    1.2       129                                 +-----------+-------+----------+----------+--------+--------+--------+   Visualization of the Distal Abdominal Aorta, Right CIA Proximal artery and  Left CIA Proximal artery was limited.        Summary:  Abdominal Aorta: There is evidence of abnormal dilatation of the mid and  distal Abdominal aorta. The largest aortic measurement is 4.7 cm. The  largest aortic diameter remains essentially unchanged compared to prior  exam (CT Abdomen). Previous diameter  measurement was 4.7 cm obtained on 05/28/2020.      ABI Findings:  +---------+------------------+-----+--------+--------+  Right    Rt Pressure (mmHg)IndexWaveformComment   +---------+------------------+-----+--------+--------+  Brachial 159                                       +---------+------------------+-----+--------+--------+  PTA      106               0.67 biphasic          +---------+------------------+-----+--------+--------+  DP       84                0.53 biphasic          +---------+------------------+-----+--------+--------+  Great Toe46                0.29 Abnormal          +---------+------------------+-----+--------+--------+   +---------+------------------+-----+----------+-------+  Left     Lt Pressure (mmHg)IndexWaveform  Comment  +---------+------------------+-----+----------+-------+  Brachial 157                                       +---------+------------------+-----+----------+-------+  PTA      77                0.48 monophasic         +---------+------------------+-----+----------+-------+  DP       56                0.35 monophasic         +---------+------------------+-----+----------+-------+  Great Toe36                0.23 Abnormal           +---------+------------------+-----+----------+-------+   +-------+-----------+-----------+------------+------------+  ABI/TBIToday's ABIToday's TBIPrevious ABIPrevious TBI  +-------+-----------+-----------+------------+------------+  Right  0.67       0.29       1.01        0.57          +-------+-----------+-----------+------------+------------+  Left   0.48       0.23       0.55        0.39          +-------+-----------+-----------+------------+------------+     Bilateral ABIs and TBIs appear decreased compared to prior study on  01/07/2017.     Summary:  Right: Resting right ankle-brachial index indicates moderate right lower  extremity arterial disease. The right toe-brachial index is abnormal.   Left: Resting left ankle-brachial index indicates severe left lower  extremity arterial disease. The left toe-brachial index is abnormal.       Right Graft #1: CFA-AK popliteal   +------------------+--------+---------------+----------+--------+                    PSV cm/sStenosis       Waveform  Comments  +------------------+--------+---------------+----------+--------+  Inflow            310     50-74% stenosismonophasic          +------------------+--------+---------------+----------+--------+  Prox Anastomosis  162                    monophasic          +------------------+--------+---------------+----------+--------+  Proximal Graft    62                     monophasic          +------------------+--------+---------------+----------+--------+  Mid Graft         46                     monophasic          +------------------+--------+---------------+----------+--------+  Distal Graft      61                     monophasic          +------------------+--------+---------------+----------+--------+  Distal Anastomosis216     50-70% stenosismonophasic          +------------------+--------+---------------+----------+--------+  Outflow           39                     monophasic          +------------------+--------+---------------+----------+--------+  Summary:  Right: Patent common femoral to popliteal artery bypass graft. Elevated  velocities suggest 50-74% stenosis in the inflow artery and distal  anastomosis.     ASSESSMENT:  PAD s/p right LE bypass and surveillance of asymptomatic AAA The AAA has increased in size on ultrasound, but is equal to the CTA measurement of 4.7 cm.  He denise abdominal pain or lumbar pain.   No significant change in ABI's and he remains asymptomatic of rest pain, claudication or tissue loss.     PLAN: He will f/u in 6 months for repeat AAA study and LE arterial duplex with ABI's.  We reviewed symptoms of AAA rupture and ischemia to his LE's.  If these occur he will call 911.   Roxy Horseman PA-C [facility] [place] Office: [phone]

## 2021-01-17 ENCOUNTER — Other Ambulatory Visit: Payer: Self-pay

## 2021-01-17 DIAGNOSIS — I739 Peripheral vascular disease, unspecified: Secondary | ICD-10-CM

## 2021-01-17 DIAGNOSIS — I714 Abdominal aortic aneurysm, without rupture, unspecified: Secondary | ICD-10-CM

## 2021-01-21 DIAGNOSIS — I10 Essential (primary) hypertension: Secondary | ICD-10-CM | POA: Diagnosis not present

## 2021-01-21 DIAGNOSIS — J449 Chronic obstructive pulmonary disease, unspecified: Secondary | ICD-10-CM | POA: Diagnosis not present

## 2021-01-22 DIAGNOSIS — J449 Chronic obstructive pulmonary disease, unspecified: Secondary | ICD-10-CM | POA: Diagnosis not present

## 2021-01-22 DIAGNOSIS — I10 Essential (primary) hypertension: Secondary | ICD-10-CM | POA: Diagnosis not present

## 2021-01-22 DIAGNOSIS — I714 Abdominal aortic aneurysm, without rupture, unspecified: Secondary | ICD-10-CM | POA: Diagnosis not present

## 2021-02-11 DIAGNOSIS — J441 Chronic obstructive pulmonary disease with (acute) exacerbation: Secondary | ICD-10-CM | POA: Diagnosis not present

## 2021-03-04 DIAGNOSIS — H04123 Dry eye syndrome of bilateral lacrimal glands: Secondary | ICD-10-CM | POA: Diagnosis not present

## 2021-03-04 DIAGNOSIS — H40033 Anatomical narrow angle, bilateral: Secondary | ICD-10-CM | POA: Diagnosis not present

## 2021-04-21 DIAGNOSIS — I10 Essential (primary) hypertension: Secondary | ICD-10-CM | POA: Diagnosis not present

## 2021-04-21 DIAGNOSIS — J449 Chronic obstructive pulmonary disease, unspecified: Secondary | ICD-10-CM | POA: Diagnosis not present

## 2021-04-25 DIAGNOSIS — J180 Bronchopneumonia, unspecified organism: Secondary | ICD-10-CM | POA: Diagnosis not present

## 2021-04-25 DIAGNOSIS — I5020 Unspecified systolic (congestive) heart failure: Secondary | ICD-10-CM | POA: Diagnosis not present

## 2021-04-25 DIAGNOSIS — I10 Essential (primary) hypertension: Secondary | ICD-10-CM | POA: Diagnosis not present

## 2021-04-25 DIAGNOSIS — D6869 Other thrombophilia: Secondary | ICD-10-CM | POA: Diagnosis not present

## 2021-07-18 DIAGNOSIS — I480 Paroxysmal atrial fibrillation: Secondary | ICD-10-CM | POA: Diagnosis not present

## 2021-07-18 DIAGNOSIS — J9611 Chronic respiratory failure with hypoxia: Secondary | ICD-10-CM | POA: Diagnosis not present

## 2021-07-18 DIAGNOSIS — I5022 Chronic systolic (congestive) heart failure: Secondary | ICD-10-CM | POA: Diagnosis not present

## 2021-07-18 DIAGNOSIS — I10 Essential (primary) hypertension: Secondary | ICD-10-CM | POA: Diagnosis not present

## 2021-07-18 DIAGNOSIS — Z79899 Other long term (current) drug therapy: Secondary | ICD-10-CM | POA: Diagnosis not present

## 2021-07-25 ENCOUNTER — Ambulatory Visit (INDEPENDENT_AMBULATORY_CARE_PROVIDER_SITE_OTHER)
Admission: RE | Admit: 2021-07-25 | Discharge: 2021-07-25 | Disposition: A | Discharge status: Home / Self Care | Payer: PPO | Source: Ambulatory Visit | Attending: Physician Assistant | Admitting: Physician Assistant

## 2021-07-25 ENCOUNTER — Ambulatory Visit: Payer: PPO | Admitting: Physician Assistant

## 2021-07-25 ENCOUNTER — Ambulatory Visit (HOSPITAL_COMMUNITY)
Admission: RE | Admit: 2021-07-25 | Discharge: 2021-07-25 | Disposition: A | Discharge status: Home / Self Care | Payer: PPO | Source: Ambulatory Visit | Attending: Physician Assistant | Admitting: Physician Assistant

## 2021-07-25 VITALS — BP 126/76 | HR 95 | Temp 98.0°F | Ht 71.0 in | Wt 235.0 lb

## 2021-07-25 DIAGNOSIS — I714 Abdominal aortic aneurysm, without rupture, unspecified: Secondary | ICD-10-CM | POA: Insufficient documentation

## 2021-07-25 DIAGNOSIS — I480 Paroxysmal atrial fibrillation: Secondary | ICD-10-CM | POA: Diagnosis not present

## 2021-07-25 DIAGNOSIS — I739 Peripheral vascular disease, unspecified: Secondary | ICD-10-CM | POA: Insufficient documentation

## 2021-07-25 DIAGNOSIS — J9611 Chronic respiratory failure with hypoxia: Secondary | ICD-10-CM | POA: Diagnosis not present

## 2021-07-25 DIAGNOSIS — I5030 Unspecified diastolic (congestive) heart failure: Secondary | ICD-10-CM | POA: Diagnosis not present

## 2021-07-25 NOTE — Progress Notes (Signed)
?Office Note  ? ? ? ?CC:  follow up ?Requesting Provider:  [name], MD ? ?HPI: Derrick Moon is a 81 y.o. (Feb 25, 1941) male who presents for surveillance of AAA as well as for right leg bypass graft.  He denies any new or changing abdominal or back pain.  He also denies any rest pain or tissue loss of bilateral lower extremities.  He has a right femoral to above-the-knee popliteal bypass with vein performed by Dr. Bridgett Larsson in 2017 due to right foot ulcerations.  Patient is here with his wife.  He states he mainly is wheelchair-bound.  His activity is limited greatly by his advanced COPD with 24/7 oxygen support.  He is on Eliquis for atrial fibrillation.  He is a cigar smoker. ? ?Past Medical History:  ?Diagnosis Date  ? AAA (abdominal aortic aneurysm) ([place])   ? Anxiety   ? due to pain in leg  ? Arrhythmia   ? Arthritis   ? Osteo  ? Bell's palsy   ? Cancer [facility])   ? Bladder cancer  ? Cataracts, bilateral   ? CHF (congestive heart failure) ([place])   ? COPD (chronic obstructive pulmonary disease) ([place])   ? O2 supplementation dependent  ? Diverticulitis   ? Enlarged prostate   ? takes Flomax daily  ? GERD (gastroesophageal reflux disease)   ? Headache   ? migraines years ago  ? History of bronchitis   ? > 60yr ago  ? History of colon polyps   ? benign  ? History of dizziness   ? several yrs ago-was on Meclizine but has been off for several yrs-no problems since  ? History of hiatal hernia   ? Hypertension   ? takes Amlodipine and Lisinpril daily  ? Joint pain   ? Joint swelling   ? Nocturia   ? Oxygen deficiency   ? 2 1/2 L  ? Oxygen dependent   ? 2 1/2 L  ? [place]   ?  bilateral superficial femoral artery occlusions   ? Paroxysmal atrial fibrillation (HCC)   ? Pneumonia   ? several times with most recent within 5 yrs ago  ? Restless leg syndrome   ? Rupture of left quadriceps tendon   ? Shortness of breath dyspnea   ? with exertion  ? Sleep apnea   ? does not use cpap  ? Urinary urgency    ? ? ?Past Surgical History:  ?Procedure Laterality Date  ? BICEPS TENDON REPAIR    ? LEFT  ? CARDIOVERSION N/A 02/06/2017  ? Procedure: CARDIOVERSION;  Surgeon: SJerline Pain MD;  [facility];  Service: Cardiovascular;  Laterality: N/A;  ? CARPAL TUNNEL RELEASE Right 2014  ? CHOLECYSTECTOMY  July 16,2010  ? COLONOSCOPY  2003  ? 6 polyps, left sided diverticula. Path with hyperplastic polyps per review of colonoscopy report in 2011.   ? COLONOSCOPY  03/2009  ? Scattered left sided diverticula.   ? COLONOSCOPY WITH PROPOFOL N/A 08/27/2020  ? Procedure: COLONOSCOPY WITH PROPOFOL;  Surgeon: RDaneil Dolin MD;  [facility];  Service: Endoscopy;  Laterality: N/A;  11:00am  ? CYSTOSCOPY    ? ESOPHAGOGASTRODUODENOSCOPY    ? ESOPHAGOGASTRODUODENOSCOPY (EGD) WITH PROPOFOL N/A 08/27/2020  ? Procedure: ESOPHAGOGASTRODUODENOSCOPY (EGD) WITH PROPOFOL;  Surgeon: RDaneil Dolin MD;  [facility];  Service: Endoscopy;  Laterality: N/A;  ? FEMORAL-POPLITEAL BYPASS GRAFT Right 09/19/2015  ? Procedure: RIGHT COMMON FEMORAL-ABOVE KNEE POPLITEAL ARTERY  BYPASS GRAFT ;  Surgeon: Conrad [place], MD;  Location: [place];  Service: Vascular;  Laterality: Right;  ? FOOT SURGERY Right   ? x 5t  ? HAND SURGERY Right   ? HERNIA REPAIR Bilateral   ? inguinal  ? HOT HEMOSTASIS  08/27/2020  ? Procedure: HOT HEMOSTASIS (ARGON PLASMA COAGULATION/BICAP);  Surgeon: Daneil Dolin, MD;  [facility];  Service: Endoscopy;;  ? PERIPHERAL VASCULAR CATHETERIZATION N/A 08/02/2015  ? Procedure: Abdominal Aortogram w/Lower Extremity;  Surgeon: Conrad Van, MD;  Location: [place] [facility];  Service: Cardiovascular;  Laterality: N/A;  ? POLYPECTOMY  08/27/2020  ? Procedure: POLYPECTOMY INTESTINAL;  Surgeon: Daneil Dolin, MD;  [facility];  Service: Endoscopy;;  ? QUADRICEPS TENDON REPAIR Left 08/22/2016  ? Procedure: LEFT OPEN QUADRICEP TENDON REPAIR;  Surgeon: Netta Cedars, MD;  Location: [place];  Service:  Orthopedics;  Laterality: Left;  ? REVERSE SHOULDER ARTHROPLASTY Right 07/07/2014  ? Procedure: RIGHT REVERSE TOTAL SHOULDER ;  Surgeon: Netta Cedars, MD;  Location: [place];  Service: Orthopedics;  Laterality: Right;  ? REVERSE TOTAL SHOULDER ARTHROPLASTY Right 07/07/2014  ? Dr Veverly Fells  ? ROTATOR CUFF REPAIR    ? Bilateral   ? TEE WITHOUT CARDIOVERSION N/A 02/06/2017  ? Procedure: TRANSESOPHAGEAL ECHOCARDIOGRAM (TEE);  Surgeon: Jerline Pain, MD;  Location: [facility] ENDOSCOPY;  Service: Cardiovascular;  Laterality: N/A;  ? TONSILLECTOMY    ? tumor removed from bladder    ? VEIN HARVEST Right 09/19/2015  ? Procedure: WITH NON REVERSE RIGHT GREATER SAPHENOUS VEIN HARVEST;  Surgeon: Conrad Etna, MD;  Location: [place];  Service: Vascular;  Laterality: Right;  ? ? ?Social History  ? ?Socioeconomic History  ? Marital status: Married  ?  Spouse name: Not on file  ? Number of children: Not on file  ? Years of education: Not on file  ? Highest education level: Associate degree: occupational, Hotel manager, or vocational program  ?Occupational History  ? Not on file  ?Tobacco Use  ? Smoking status: Some Days  ?  Packs/day: 0.25  ?  Years: 45.00  ?  Pack years: 11.25  ?  Types: Cigars, Cigarettes  ?  Last attempt to quit: 01/07/2019  ?  Years since quitting: 2.5  ? Smokeless tobacco: Never  ? Tobacco comments:  ?  smokes 1/3 of a cigar a day- 12/27/2019  ?Vaping Use  ? Vaping Use: Never used  ?Substance and Sexual Activity  ? Alcohol use: Yes  ?  Alcohol/week: 0.0 standard drinks  ?  Comment: wine occasionally  ? Drug use: Not Currently  ?  Types: Marijuana  ? Sexual activity: Yes  ?Other Topics Concern  ? Not on file  ?Social History Narrative  ? Pt lives w/ wife in [place], [place]  ? ?Social Determinants of Health  ? ?Financial Resource Strain: Not on file  ?Food Insecurity: Not on file  ?Transportation Needs: Not on file  ?Physical Activity: Not on file  ?Stress: Not on file  ?Social Connections: Not on file  ?Intimate Partner Violence:  Not on file  ? ? ?Family History  ?Problem Relation Age of Onset  ? Parkinson's disease Mother   ? AAA (abdominal aortic aneurysm) Father   ? Heart defect Sister   ?     Congenital Heart Disease  ? Heart disease Sister   ? ? ?Current Outpatient Medications  ?Medication Sig Dispense Refill  ? albuterol (PROVENTIL HFA;VENTOLIN HFA) 108 (90 BASE) MCG/ACT inhaler  Inhale 1-2 puffs into the lungs every 6 (six) hours as needed for wheezing or shortness of breath. Reported on 10/03/2015    ? amLODipine (NORVASC) 5 MG tablet Take 5 mg by mouth daily.    ? apixaban (ELIQUIS) 5 MG TABS tablet Take 1 tablet (5 mg total) 2 (two) times daily by mouth. 60 tablet 0  ? azithromycin (ZITHROMAX) 250 MG tablet Take 250 mg by mouth every Monday, Wednesday, and Friday.    ? bisacodyl (DULCOLAX) 5 MG EC tablet Take 5 mg by mouth daily as needed for moderate constipation.    ? budesonide-formoterol (SYMBICORT) 160-4.5 MCG/ACT inhaler Inhale 2 puffs into the lungs in the morning and at bedtime. 1 each 6  ? diphenhydrAMINE (BENADRYL) 25 MG tablet Take 25 mg by mouth daily as needed for itching or allergies.    ? famotidine (PEPCID) 20 MG tablet TAKE (1) TABLET BY MOUTH AT BEDTIME. (Patient taking differently: Take 20 mg by mouth at bedtime. TAKE (1) TABLET BY MOUTH AT BEDTIME.) 30 tablet 0  ? guaiFENesin (MUCINEX) 600 MG 12 hr tablet Take 600 mg by mouth 2 (two) times daily.    ? ipratropium-albuterol (DUONEB) 0.5-2.5 (3) MG/3ML SOLN Take 3 mLs by nebulization every 4 (four) hours as needed. 360 mL 0  ? levocetirizine (XYZAL) 5 MG tablet Take 5 mg by mouth at bedtime.    ? losartan (COZAAR) 50 MG tablet Take 50 mg by mouth at bedtime.    ? metoprolol tartrate (LOPRESSOR) 25 MG tablet Take 0.5 tablets (12.5 mg total) by mouth 2 (two) times daily. (Patient taking differently: Take 12.5 mg by mouth daily.) 30 tablet 3  ? montelukast (SINGULAIR) 10 MG tablet TAKE (1) TABLET BY MOUTH AT BEDTIME. (Patient taking differently: Take 10 mg by mouth at  bedtime.) 30 tablet 1  ? pantoprazole (PROTONIX) 40 MG tablet TAKE 1 TABLET BY MOUTH 30 TO 60 MINUTES PRIOR TO THE FIRST MEAL OF THE DAY. (Patient taking differently: Take 40 mg by mouth daily before

## 2021-07-30 ENCOUNTER — Other Ambulatory Visit: Payer: Self-pay | Admitting: *Deleted

## 2021-07-30 DIAGNOSIS — I739 Peripheral vascular disease, unspecified: Secondary | ICD-10-CM

## 2021-08-16 ENCOUNTER — Other Ambulatory Visit: Payer: Self-pay | Admitting: Internal Medicine

## 2021-08-27 ENCOUNTER — Other Ambulatory Visit: Payer: Self-pay | Admitting: Internal Medicine

## 2021-08-28 NOTE — Telephone Encounter (Signed)
Patients wife is going to call back to schedule an appt with Dr. Shearon Stalls for patient for refill- hasnt been seen in over a year

## 2021-09-03 ENCOUNTER — Other Ambulatory Visit: Payer: Self-pay

## 2021-09-03 ENCOUNTER — Encounter (HOSPITAL_COMMUNITY): Payer: Self-pay | Admitting: *Deleted

## 2021-09-03 ENCOUNTER — Emergency Department (HOSPITAL_COMMUNITY): Payer: PPO

## 2021-09-03 ENCOUNTER — Emergency Department (HOSPITAL_COMMUNITY)
Admission: EM | Admit: 2021-09-03 | Discharge: 2021-09-03 | Disposition: A | Discharge status: Home / Self Care | Payer: PPO | Attending: Emergency Medicine | Admitting: Emergency Medicine

## 2021-09-03 DIAGNOSIS — Z79899 Other long term (current) drug therapy: Secondary | ICD-10-CM | POA: Insufficient documentation

## 2021-09-03 DIAGNOSIS — Z7901 Long term (current) use of anticoagulants: Secondary | ICD-10-CM | POA: Diagnosis not present

## 2021-09-03 DIAGNOSIS — Z8709 Personal history of other diseases of the respiratory system: Secondary | ICD-10-CM

## 2021-09-03 DIAGNOSIS — N400 Enlarged prostate without lower urinary tract symptoms: Secondary | ICD-10-CM | POA: Diagnosis not present

## 2021-09-03 DIAGNOSIS — J441 Chronic obstructive pulmonary disease with (acute) exacerbation: Secondary | ICD-10-CM

## 2021-09-03 DIAGNOSIS — Z8679 Personal history of other diseases of the circulatory system: Secondary | ICD-10-CM | POA: Insufficient documentation

## 2021-09-03 DIAGNOSIS — I4891 Unspecified atrial fibrillation: Secondary | ICD-10-CM | POA: Diagnosis not present

## 2021-09-03 DIAGNOSIS — R109 Unspecified abdominal pain: Secondary | ICD-10-CM | POA: Diagnosis present

## 2021-09-03 DIAGNOSIS — I714 Abdominal aortic aneurysm, without rupture, unspecified: Secondary | ICD-10-CM | POA: Diagnosis not present

## 2021-09-03 DIAGNOSIS — N200 Calculus of kidney: Secondary | ICD-10-CM | POA: Diagnosis not present

## 2021-09-03 DIAGNOSIS — J449 Chronic obstructive pulmonary disease, unspecified: Secondary | ICD-10-CM | POA: Diagnosis not present

## 2021-09-03 DIAGNOSIS — N132 Hydronephrosis with renal and ureteral calculous obstruction: Secondary | ICD-10-CM | POA: Diagnosis not present

## 2021-09-03 DIAGNOSIS — K5730 Diverticulosis of large intestine without perforation or abscess without bleeding: Secondary | ICD-10-CM | POA: Diagnosis not present

## 2021-09-03 DIAGNOSIS — Z7951 Long term (current) use of inhaled steroids: Secondary | ICD-10-CM | POA: Diagnosis not present

## 2021-09-03 DIAGNOSIS — I10 Essential (primary) hypertension: Secondary | ICD-10-CM | POA: Diagnosis not present

## 2021-09-03 LAB — CBC WITH DIFFERENTIAL/PLATELET
Abs Immature Granulocytes: 0.11 10*3/uL — ABNORMAL HIGH (ref 0.00–0.07)
Basophils Absolute: 0 10*3/uL (ref 0.0–0.1)
Basophils Relative: 0 %
Eosinophils Absolute: 0 10*3/uL (ref 0.0–0.5)
Eosinophils Relative: 0 %
HCT: 48.6 % (ref 39.0–52.0)
Hemoglobin: 14.7 g/dL (ref 13.0–17.0)
Immature Granulocytes: 1 %
Lymphocytes Relative: 8 %
Lymphs Abs: 1.5 10*3/uL (ref 0.7–4.0)
MCH: 25.7 pg — ABNORMAL LOW (ref 26.0–34.0)
MCHC: 30.2 g/dL (ref 30.0–36.0)
MCV: 84.8 fL (ref 80.0–100.0)
Monocytes Absolute: 1.2 10*3/uL — ABNORMAL HIGH (ref 0.1–1.0)
Monocytes Relative: 7 %
Neutro Abs: 14.7 10*3/uL — ABNORMAL HIGH (ref 1.7–7.7)
Neutrophils Relative %: 84 %
Platelets: 326 10*3/uL (ref 150–400)
RBC: 5.73 MIL/uL (ref 4.22–5.81)
RDW: 17.1 % — ABNORMAL HIGH (ref 11.5–15.5)
WBC: 17.5 10*3/uL — ABNORMAL HIGH (ref 4.0–10.5)
nRBC: 0 % (ref 0.0–0.2)

## 2021-09-03 LAB — COMPREHENSIVE METABOLIC PANEL
ALT: 25 U/L (ref 0–44)
AST: 22 U/L (ref 15–41)
Albumin: 3.8 g/dL (ref 3.5–5.0)
Alkaline Phosphatase: 103 U/L (ref 38–126)
Anion gap: 9 (ref 5–15)
BUN: 19 mg/dL (ref 8–23)
CO2: 27 mmol/L (ref 22–32)
Calcium: 9.1 mg/dL (ref 8.9–10.3)
Chloride: 104 mmol/L (ref 98–111)
Creatinine, Ser: 1.57 mg/dL — ABNORMAL HIGH (ref 0.61–1.24)
GFR, Estimated: 44 mL/min — ABNORMAL LOW (ref >60)
Glucose, Bld: 202 mg/dL — ABNORMAL HIGH (ref 70–99)
Potassium: 4.2 mmol/L (ref 3.5–5.1)
Sodium: 140 mmol/L (ref 135–145)
Total Bilirubin: 0.6 mg/dL (ref 0.3–1.2)
Total Protein: 7.3 g/dL (ref 6.5–8.1)

## 2021-09-03 LAB — URINALYSIS, ROUTINE W REFLEX MICROSCOPIC
Bacteria, UA: NONE SEEN
Bilirubin Urine: NEGATIVE
Glucose, UA: NEGATIVE mg/dL
Hgb urine dipstick: NEGATIVE
Ketones, ur: 5 mg/dL — AB
Leukocytes,Ua: NEGATIVE
Nitrite: NEGATIVE
Protein, ur: 30 mg/dL — AB
Specific Gravity, Urine: 1.025 (ref 1.005–1.030)
pH: 5 (ref 5.0–8.0)

## 2021-09-03 LAB — LIPASE, BLOOD: Lipase: 24 U/L (ref 11–51)

## 2021-09-03 MED ORDER — FENTANYL CITRATE PF 50 MCG/ML IJ SOSY
25.0000 ug | PREFILLED_SYRINGE | Freq: Once | INTRAMUSCULAR | Status: AC
  Administered 2021-09-03: 25 ug via INTRAVENOUS
  Filled 2021-09-03: qty 1

## 2021-09-03 MED ORDER — OXYCODONE-ACETAMINOPHEN 5-325 MG PO TABS: 1.0000 | ORAL_TABLET | ORAL | 0 refills | Status: DC | PRN

## 2021-09-03 MED ORDER — ONDANSETRON HCL 4 MG/2ML IJ SOLN
4.0000 mg | Freq: Once | INTRAMUSCULAR | Status: AC
  Administered 2021-09-03: 4 mg via INTRAVENOUS
  Filled 2021-09-03: qty 2

## 2021-09-03 MED ORDER — ONDANSETRON HCL 4 MG PO TABS: 4.0000 mg | ORAL_TABLET | Freq: Four times a day (QID) | ORAL | 0 refills | Status: AC

## 2021-09-03 NOTE — ED Provider Notes (Signed)
[facility] EMERGENCY DEPARTMENT Provider Note   CSN: [id number] Arrival date & time: 09/03/21  5427     History {Add pertinent medical, surgical, social history, OB history to HPI:1} Chief Complaint  Patient presents with   Flank Pain    Derrick Moon is a 81 y.o. male.   Flank Pain       Home Medications Prior to Admission medications   Medication Sig Start Date End Date Taking? Authorizing Provider  albuterol (PROVENTIL HFA;VENTOLIN HFA) 108 (90 BASE) MCG/ACT inhaler Inhale 1-2 puffs into the lungs every 6 (six) hours as needed for wheezing or shortness of breath. Reported on 10/03/2015    [provider]  amLODipine (NORVASC) 5 MG tablet Take 5 mg by mouth daily. 12/06/19   [provider]  apixaban (ELIQUIS) 5 MG TABS tablet Take 1 tablet (5 mg total) 2 (two) times daily by mouth. 02/08/17   Cristal Ford, DO  azithromycin (ZITHROMAX) 250 MG tablet Take 250 mg by mouth every Monday, Wednesday, and Friday. 07/23/20   [provider]  bisacodyl (DULCOLAX) 5 MG EC tablet Take 5 mg by mouth daily as needed for moderate constipation.    [provider]  diphenhydrAMINE (BENADRYL) 25 MG tablet Take 25 mg by mouth daily as needed for itching or allergies.    [provider]  famotidine (PEPCID) 20 MG tablet TAKE (1) TABLET BY MOUTH AT BEDTIME. Patient taking differently: Take 20 mg by mouth at bedtime. TAKE (1) TABLET BY MOUTH AT BEDTIME. 04/19/19   Tanda Rockers, MD  guaiFENesin (MUCINEX) 600 MG 12 hr tablet Take 600 mg by mouth 2 (two) times daily.    [provider]  ipratropium-albuterol (DUONEB) 0.5-2.5 (3) MG/3ML SOLN Take 3 mLs by nebulization every 4 (four) hours as needed. 03/14/19   Roxan Hockey, MD  levocetirizine (XYZAL) 5 MG tablet Take 5 mg by mouth at bedtime.    [provider]  losartan (COZAAR) 50 MG tablet Take 50 mg by mouth at bedtime. 06/19/20   [provider]  metoprolol tartrate  (LOPRESSOR) 25 MG tablet Take 0.5 tablets (12.5 mg total) by mouth 2 (two) times daily. Patient taking differently: Take 12.5 mg by mouth daily. 03/14/19   Emokpae, Courage, MD  montelukast (SINGULAIR) 10 MG tablet TAKE (1) TABLET BY MOUTH AT BEDTIME. Patient taking differently: Take 10 mg by mouth at bedtime. 08/12/18   Tanda Rockers, MD  pantoprazole (PROTONIX) 40 MG tablet TAKE 1 TABLET BY MOUTH 30 TO 60 MINUTES PRIOR TO THE FIRST MEAL OF THE DAY. Patient taking differently: Take 40 mg by mouth daily before breakfast. 09/15/18   Tanda Rockers, MD  polyethylene glycol-electrolytes (NULYTELY) 420 g solution As directed 07/05/20   Rourk, [name], MD  potassium chloride (KLOR-CON) 20 MEQ tablet Take 1 tablet (20 mEq total) by mouth 2 (two) times daily. Patient taking differently: Take 10 mEq by mouth 2 (two) times daily. 06/18/19 12/27/19  Manuella Ghazi, Pratik D, DO  predniSONE (DELTASONE) 10 MG tablet Take by mouth. 07/19/20   [provider]  SYMBICORT 160-4.5 MCG/ACT inhaler INHALE 2 PUFFS TWICE DAILY. 08/28/21   Spero Geralds, MD  Tamsulosin HCl (FLOMAX) 0.4 MG CAPS Take 0.4 mg by mouth daily after supper.     [provider]  torsemide (DEMADEX) 20 MG tablet Take 2 tablets (40 mg total) by mouth daily. Patient taking differently: Take 40 mg by mouth daily as needed (fluid, swelling). 06/18/19 12/27/19  Heath Lark D,  DO      Allergies    Adhesive [tape], Aspartame and phenylalanine, Other, and Sulfonamide derivatives    Review of Systems   Review of Systems  Genitourinary:  Positive for flank pain.    Physical Exam Updated Vital Signs Ht '5\' 11"'$  (1.803 m)   Wt 104.3 kg   BMI 32.08 kg/m  Physical Exam  ED Results / Procedures / Treatments   Labs (all labs ordered are listed, but only abnormal results are displayed) Labs Reviewed - No data to display  EKG None  Radiology No results found.  Procedures Procedures  {Document cardiac monitor, telemetry assessment  procedure when appropriate:1}  Medications Ordered in ED Medications - No data to display  ED Course/ Medical Decision Making/ A&P                           Medical Decision Making Patient here with right-sided flank pain onset yesterday.  Pain has been associated with nausea he had several episodes of vomiting earlier this morning.  On arrival, he described having right-sided flank pain that radiated to right lower abdomen.  No active vomiting during ER stay.     {Document critical care time when appropriate:1} {Document review of labs and clinical decision tools ie heart score, Chads2Vasc2 etc:1}  {Document your independent review of radiology images, and any outside records:1} {Document your discussion with family members, caretakers, and with consultants:1} {Document social determinants of health affecting pt's care:1} {Document your decision making why or why not admission, treatments were needed:1} Final Clinical Impression(s) / ED Diagnoses Final diagnoses:  None    Rx / DC Orders ED Discharge Orders     None

## 2021-09-03 NOTE — ED Notes (Signed)
Pt teaching provided on medications that may cause drowsiness. Pt instructed not to drive or operate heavy machinery while taking the prescribed medication. Pt instructed in using increased fall precautions with ambulation. Pt verbalized understanding.   Pt provided discharge instructions and prescription information. Pt was given the opportunity to ask questions and questions were answered.

## 2021-09-03 NOTE — Discharge Instructions (Signed)
Your CT today shows that you have a 3 mm kidney stone on the right.  Please follow-up with your urologist regarding this.  Also, someone from the local cardiology office should be contacting you to arrange a follow-up appointment regarding your atrial fibrillation.  Please strain all urine.  Pain medication as directed for pain control.  Return to the emergency department for any new or worsening symptoms.

## 2021-09-03 NOTE — ED Triage Notes (Signed)
Pt c/o right flank pain that started yesterday, nausea started last night after dinner and vomiting x 3 at 0300 this morning.

## 2021-09-03 NOTE — ED Provider Notes (Signed)
I provided a substantive portion of the care of this patient.  I personally performed the entirety of the history, exam, and medical decision making for this encounter.       Elnora Morrison, MD 09/11/21 [phone]

## 2021-09-03 NOTE — ED Notes (Signed)
Pt meets suspected infection criteria. Tammy Triplett, PA notified and at bedside to assess pt.

## 2021-09-20 DIAGNOSIS — J449 Chronic obstructive pulmonary disease, unspecified: Secondary | ICD-10-CM | POA: Diagnosis not present

## 2021-09-20 DIAGNOSIS — I10 Essential (primary) hypertension: Secondary | ICD-10-CM | POA: Diagnosis not present

## 2021-10-21 DIAGNOSIS — I480 Paroxysmal atrial fibrillation: Secondary | ICD-10-CM | POA: Diagnosis not present

## 2021-10-21 DIAGNOSIS — D649 Anemia, unspecified: Secondary | ICD-10-CM | POA: Diagnosis not present

## 2021-10-21 DIAGNOSIS — I5033 Acute on chronic diastolic (congestive) heart failure: Secondary | ICD-10-CM | POA: Diagnosis not present

## 2021-10-21 DIAGNOSIS — J9611 Chronic respiratory failure with hypoxia: Secondary | ICD-10-CM | POA: Diagnosis not present

## 2021-10-21 DIAGNOSIS — Z79899 Other long term (current) drug therapy: Secondary | ICD-10-CM | POA: Diagnosis not present

## 2021-10-24 NOTE — Progress Notes (Unsigned)
Office Note     CC:  follow up Requesting Provider:  [name], MD  HPI: Derrick Moon is a 81 y.o. (1941-01-22) male who presents who presents for surveillance of AAA as well as for right leg bypass graft.  He has a right femoral to above-the-knee popliteal bypass with vein performed by Dr. Bridgett Larsson in 2017 due to right foot ulcerations.    Patient is here with his wife. He denies any new or changing abdominal or back pain.  He also denies any rest pain or tissue loss of bilateral lower extremities.  He states he mainly is wheelchair-bound.  His activity is limited greatly by his advanced COPD with 24/7 oxygen support. He is a cigar smoker.  The pt is not on a statin for cholesterol management.  The pt is not on a daily aspirin.   Other AC:  Eliquis for afib The pt is on ARB, CCB, BB for hypertension.   The pt is not diabetic.   Tobacco hx:  current, cigar  Past Medical History:  Diagnosis Date   AAA (abdominal aortic aneurysm) (HCC)    Anxiety    due to pain in leg   Arrhythmia    Arthritis    Osteo   Bell's palsy    Cancer (HCC)    Bladder cancer   Cataracts, bilateral    CHF (congestive heart failure) (HCC)    COPD (chronic obstructive pulmonary disease) (HCC)    O2 supplementation dependent   Diverticulitis    Enlarged prostate    takes Flomax daily   GERD (gastroesophageal reflux disease)    Headache    migraines years ago   History of bronchitis    > 65yr ago   History of colon polyps    benign   History of dizziness    several yrs ago-was on Meclizine but has been off for several yrs-no problems since   History of hiatal hernia    Hypertension    takes Amlodipine and Lisinpril daily   Joint pain    Joint swelling    Nocturia    Oxygen deficiency    2 1/2 L   Oxygen dependent    2 1/2 L   PAD (peripheral artery disease) (HCC)     bilateral superficial femoral artery occlusions    Paroxysmal atrial fibrillation (HCC)    Pneumonia    several times with  most recent within 5 yrs ago   Restless leg syndrome    Rupture of left quadriceps tendon    Shortness of breath dyspnea    with exertion   Sleep apnea    does not use cpap   Urinary urgency     Past Surgical History:  Procedure Laterality Date   BICEPS TENDON REPAIR     LEFT   CARDIOVERSION N/A 02/06/2017   Procedure: CARDIOVERSION;  Surgeon: SJerline Pain MD;  [facility];  Service: Cardiovascular;  Laterality: N/A;   CARPAL TUNNEL RELEASE Right 2014   CHOLECYSTECTOMY  July 16,2010   COLONOSCOPY  2003   6 polyps, left sided diverticula. Path with hyperplastic polyps per review of colonoscopy report in 2011.    COLONOSCOPY  03/2009   Scattered left sided diverticula.    COLONOSCOPY WITH PROPOFOL N/A 08/27/2020   Procedure: COLONOSCOPY WITH PROPOFOL;  Surgeon: RDaneil Dolin MD;  [facility];  Service: Endoscopy;  Laterality: N/A;  11:00am   CYSTOSCOPY     ESOPHAGOGASTRODUODENOSCOPY     ESOPHAGOGASTRODUODENOSCOPY (EGD)  WITH PROPOFOL N/A 08/27/2020   Procedure: ESOPHAGOGASTRODUODENOSCOPY (EGD) WITH PROPOFOL;  Surgeon: Daneil Dolin, MD;  [facility];  Service: Endoscopy;  Laterality: N/A;   FEMORAL-POPLITEAL BYPASS GRAFT Right 09/19/2015   Procedure: RIGHT COMMON FEMORAL-ABOVE KNEE POPLITEAL ARTERY    BYPASS GRAFT ;  Surgeon: Conrad [place], MD;  Location: [place];  Service: Vascular;  Laterality: Right;   FOOT SURGERY Right    x 5t   HAND SURGERY Right    HERNIA REPAIR Bilateral    inguinal   HOT HEMOSTASIS  08/27/2020   Procedure: HOT HEMOSTASIS (ARGON PLASMA COAGULATION/BICAP);  Surgeon: Daneil Dolin, MD;  [facility];  Service: Endoscopy;;   PERIPHERAL VASCULAR CATHETERIZATION N/A 08/02/2015   Procedure: Abdominal Aortogram w/Lower Extremity;  Surgeon: Conrad Mendes, MD;  Location: [place] [facility];  Service: Cardiovascular;  Laterality: N/A;   POLYPECTOMY  08/27/2020   Procedure: POLYPECTOMY INTESTINAL;  Surgeon: Daneil Dolin,  MD;  [facility];  Service: Endoscopy;;   QUADRICEPS TENDON REPAIR Left 08/22/2016   Procedure: LEFT OPEN QUADRICEP TENDON REPAIR;  Surgeon: Netta Cedars, MD;  Location: [place];  Service: Orthopedics;  Laterality: Left;   REVERSE SHOULDER ARTHROPLASTY Right 07/07/2014   Procedure: RIGHT REVERSE TOTAL SHOULDER ;  Surgeon: Netta Cedars, MD;  Location: [place];  Service: Orthopedics;  Laterality: Right;   REVERSE TOTAL SHOULDER ARTHROPLASTY Right 07/07/2014   Dr Veverly Fells   ROTATOR CUFF REPAIR     Bilateral    TEE WITHOUT CARDIOVERSION N/A 02/06/2017   Procedure: TRANSESOPHAGEAL ECHOCARDIOGRAM (TEE);  Surgeon: Jerline Pain, MD;  [facility];  Service: Cardiovascular;  Laterality: N/A;   TONSILLECTOMY     tumor removed from bladder     VEIN HARVEST Right 09/19/2015   Procedure: WITH NON REVERSE RIGHT GREATER SAPHENOUS VEIN HARVEST;  Surgeon: Conrad Buckhorn, MD;  Location: [facility];  Service: Vascular;  Laterality: Right;    Social History   Socioeconomic History   Marital status: Married    Spouse name: Not on file   Number of children: Not on file   Years of education: Not on file   Highest education level: Associate degree: occupational, Hotel manager, or vocational program  Occupational History   Not on file  Tobacco Use   Smoking status: Some Days    Packs/day: 0.25    Years: 45.00    Total pack years: 11.25    Types: Cigars, Cigarettes    Last attempt to quit: 01/07/2019    Years since quitting: 2.7   Smokeless tobacco: Never   Tobacco comments:    smokes 1/3 of a cigar a day- 12/27/2019  Vaping Use   Vaping Use: Never used  Substance and Sexual Activity   Alcohol use: Yes    Alcohol/week: 0.0 standard drinks of alcohol    Comment: wine occasionally   Drug use: Not Currently    Types: Marijuana   Sexual activity: Yes  Other Topics Concern   Not on file  Social History Narrative   Pt lives w/ wife in [place], [place]   Social Determinants of Health    Financial Resource Strain: Not on file  Food Insecurity: Not on file  Transportation Needs: Not on file  Physical Activity: Not on file  Stress: Not on file  Social Connections: Not on file  Intimate Partner Violence: Not on file    Family History  Problem Relation Age of Onset   Parkinson's disease Mother  AAA (abdominal aortic aneurysm) Father    Heart defect Sister        Congenital Heart Disease   Heart disease Sister     Current Outpatient Medications  Medication Sig Dispense Refill   albuterol (PROVENTIL HFA;VENTOLIN HFA) 108 (90 BASE) MCG/ACT inhaler Inhale 1-2 puffs into the lungs every 6 (six) hours as needed for wheezing or shortness of breath. Reported on 10/03/2015     amLODipine (NORVASC) 5 MG tablet Take 5 mg by mouth daily.     apixaban (ELIQUIS) 5 MG TABS tablet Take 1 tablet (5 mg total) 2 (two) times daily by mouth. 60 tablet 0   azithromycin (ZITHROMAX) 250 MG tablet Take 250 mg by mouth every Monday, Wednesday, and Friday.     bisacodyl (DULCOLAX) 5 MG EC tablet Take 5 mg by mouth daily as needed for moderate constipation.     diphenhydrAMINE (BENADRYL) 25 MG tablet Take 25 mg by mouth daily as needed for itching or allergies.     famotidine (PEPCID) 20 MG tablet TAKE (1) TABLET BY MOUTH AT BEDTIME. (Patient taking differently: Take 20 mg by mouth at bedtime. TAKE (1) TABLET BY MOUTH AT BEDTIME.) 30 tablet 0   guaiFENesin (MUCINEX) 600 MG 12 hr tablet Take 600 mg by mouth 2 (two) times daily.     ipratropium-albuterol (DUONEB) 0.5-2.5 (3) MG/3ML SOLN Take 3 mLs by nebulization every 4 (four) hours as needed. 360 mL 0   levocetirizine (XYZAL) 5 MG tablet Take 5 mg by mouth at bedtime.     losartan (COZAAR) 50 MG tablet Take 50 mg by mouth at bedtime.     metoprolol tartrate (LOPRESSOR) 25 MG tablet Take 0.5 tablets (12.5 mg total) by mouth 2 (two) times daily. (Patient taking differently: Take 12.5 mg by mouth daily.) 30 tablet 3   montelukast (SINGULAIR) 10 MG  tablet TAKE (1) TABLET BY MOUTH AT BEDTIME. (Patient taking differently: Take 10 mg by mouth at bedtime.) 30 tablet 1   ondansetron (ZOFRAN) 4 MG tablet Take 1 tablet (4 mg total) by mouth every 6 (six) hours. As needed for nausea vomiting 12 tablet 0   oxyCODONE-acetaminophen (PERCOCET/ROXICET) 5-325 MG tablet Take 1 tablet by mouth every 4 (four) hours as needed. 12 tablet 0   pantoprazole (PROTONIX) 40 MG tablet TAKE 1 TABLET BY MOUTH 30 TO 60 MINUTES PRIOR TO THE FIRST MEAL OF THE DAY. (Patient taking differently: Take 40 mg by mouth daily before breakfast.) 30 tablet 0   polyethylene glycol-electrolytes (NULYTELY) 420 g solution As directed 4000 mL 0   potassium chloride (KLOR-CON) 20 MEQ tablet Take 1 tablet (20 mEq total) by mouth 2 (two) times daily. (Patient taking differently: Take 10 mEq by mouth 2 (two) times daily.) 60 tablet 1   predniSONE (DELTASONE) 10 MG tablet Take by mouth.     SYMBICORT 160-4.5 MCG/ACT inhaler INHALE 2 PUFFS TWICE DAILY. 10.2 g 0   Tamsulosin HCl (FLOMAX) 0.4 MG CAPS Take 0.4 mg by mouth daily after supper.      torsemide (DEMADEX) 20 MG tablet Take 2 tablets (40 mg total) by mouth daily. (Patient taking differently: Take 40 mg by mouth daily as needed (fluid, swelling).) 60 tablet 2   No current facility-administered medications for this visit.    Allergies  Allergen Reactions   Adhesive [Tape] Other (See Comments)    SKIN IS VERY THIN AND IT TEARS VERY EASILY!!! Please use an alternative   Aspartame And Phenylalanine Nausea Only   Other Rash  Pt reports rash on back, buttocks, shoulders and stomach after arteriogram   Sulfonamide Derivatives Nausea And Vomiting     REVIEW OF SYSTEMS:  '[X]'$  denotes positive finding, '[ ]'$  denotes negative finding Cardiac  Comments:  Chest pain or chest pressure:    Shortness of breath upon exertion:    Short of breath when lying flat:    Irregular heart rhythm:        Vascular    Pain in calf, thigh, or hip  brought on by ambulation:    Pain in feet at night that wakes you up from your sleep:     Blood clot in your veins:    Leg swelling:         Pulmonary    Oxygen at home:    Productive cough:     Wheezing:         Neurologic    Sudden weakness in arms or legs:     Sudden numbness in arms or legs:     Sudden onset of difficulty speaking or slurred speech:    Temporary loss of vision in one eye:     Problems with dizziness:         Gastrointestinal    Blood in stool:     Vomited blood:         Genitourinary    Burning when urinating:     Blood in urine:        Psychiatric    Major depression:         Hematologic    Bleeding problems:    Problems with blood clotting too easily:        Skin    Rashes or ulcers:        Constitutional    Fever or chills:      PHYSICAL EXAMINATION:  There were no vitals filed for this visit.  General:  WDWN in NAD; vital signs documented above Gait: Not observed HENT: WNL, normocephalic Pulmonary: normal non-labored breathing , without Rales, rhonchi,  wheezing Cardiac: {Desc; regular/irreg:14544} HR, without  Murmurs {With/Without:20273} carotid bruit*** Abdomen: soft, NT, no masses Skin: {With/Without:20273} rashes Vascular Exam/Pulses:  Right Left  Radial {Exam; arterial pulse strength 0-4:30167} {Exam; arterial pulse strength 0-4:30167}  Ulnar {Exam; arterial pulse strength 0-4:30167} {Exam; arterial pulse strength 0-4:30167}  Femoral {Exam; arterial pulse strength 0-4:30167} {Exam; arterial pulse strength 0-4:30167}  Popliteal {Exam; arterial pulse strength 0-4:30167} {Exam; arterial pulse strength 0-4:30167}  DP {Exam; arterial pulse strength 0-4:30167} {Exam; arterial pulse strength 0-4:30167}  PT {Exam; arterial pulse strength 0-4:30167} {Exam; arterial pulse strength 0-4:30167}   Extremities: {With/Without:20273} ischemic changes, {With/Without:20273} Gangrene , {With/Without:20273} cellulitis; {With/Without:20273} open  wounds;  Musculoskeletal: no muscle wasting or atrophy  Neurologic: A&O X 3;  No focal weakness or paresthesias are detected Psychiatric:  The pt has {Desc; normal/abnormal:11317::"Normal"} affect.   Non-Invasive Vascular Imaging:   ***    ASSESSMENT/PLAN:: 81 y.o. male here for follow up for who presents for surveillance of AAA as well as for right leg bypass graft.  He has a right femoral to above-the-knee popliteal bypass with vein performed by Dr. Bridgett Larsson in 2017 due to right foot ulcerations.  He is without any associated symptoms currently His duplex today shows ***  -***   Karoline Caldwell, PA-C [facility] [phone]  Clinic MD:   [facility]

## 2021-10-29 ENCOUNTER — Ambulatory Visit: Payer: PPO

## 2021-10-29 ENCOUNTER — Encounter (HOSPITAL_COMMUNITY): Payer: PPO

## 2021-10-29 ENCOUNTER — Other Ambulatory Visit (HOSPITAL_COMMUNITY): Payer: PPO

## 2021-10-29 DIAGNOSIS — I714 Abdominal aortic aneurysm, without rupture, unspecified: Secondary | ICD-10-CM

## 2021-10-29 DIAGNOSIS — I739 Peripheral vascular disease, unspecified: Secondary | ICD-10-CM

## 2021-10-31 DIAGNOSIS — Z8551 Personal history of malignant neoplasm of bladder: Secondary | ICD-10-CM | POA: Diagnosis not present

## 2021-10-31 DIAGNOSIS — N400 Enlarged prostate without lower urinary tract symptoms: Secondary | ICD-10-CM | POA: Diagnosis not present

## 2021-11-05 ENCOUNTER — Other Ambulatory Visit: Payer: Self-pay | Admitting: Internal Medicine

## 2021-11-13 DIAGNOSIS — J449 Chronic obstructive pulmonary disease, unspecified: Secondary | ICD-10-CM | POA: Diagnosis not present

## 2021-11-13 DIAGNOSIS — I5030 Unspecified diastolic (congestive) heart failure: Secondary | ICD-10-CM | POA: Diagnosis not present

## 2021-11-13 DIAGNOSIS — I480 Paroxysmal atrial fibrillation: Secondary | ICD-10-CM | POA: Diagnosis not present

## 2021-11-14 ENCOUNTER — Ambulatory Visit: Payer: PPO | Admitting: Internal Medicine

## 2021-11-14 ENCOUNTER — Encounter: Payer: Self-pay | Admitting: Internal Medicine

## 2021-11-14 VITALS — BP 130/80 | HR 85 | Ht 71.0 in | Wt 232.2 lb

## 2021-11-14 DIAGNOSIS — I480 Paroxysmal atrial fibrillation: Secondary | ICD-10-CM

## 2021-11-14 DIAGNOSIS — J441 Chronic obstructive pulmonary disease with (acute) exacerbation: Secondary | ICD-10-CM | POA: Diagnosis not present

## 2021-11-14 MED ORDER — LEVALBUTEROL HCL 1.25 MG/3ML IN NEBU: 1.2500 mg | INHALATION_SOLUTION | RESPIRATORY_TRACT | 12 refills | Status: AC | PRN

## 2021-11-14 MED ORDER — LEVALBUTEROL TARTRATE 45 MCG/ACT IN AERO: 2.0000 | INHALATION_SPRAY | Freq: Four times a day (QID) | RESPIRATORY_TRACT | 12 refills | Status: AC | PRN

## 2021-11-14 MED ORDER — SODIUM CHLORIDE 0.9 % IN NEBU: 3.0000 mL | INHALATION_SOLUTION | RESPIRATORY_TRACT | 12 refills | Status: AC | PRN

## 2021-11-14 NOTE — Progress Notes (Signed)
Derrick Moon    [id number]    June 14, 1940  Primary Care Physician:Fagan, Carloyn Manner, MD Date of Appointment: 11/14/2021 Established Patient Visit  Chief complaint:   Chief Complaint  Patient presents with   Follow-up    Follow-up: SOB    HPI: Derrick Moon is a 81 y.o. gentleman with CHF and COPD with ongoing tobacco use disorder  Interval Updates: Here for follow up for COPD after over a year.  Was put back on symbicort because trelegy wasn't working as well for him.  Sine his last visit with me has had prednisone courses from PCP about every 6 weeks.    He was having frequent exacerbations so we tried azithromycin MWF. When it ran out he stopped taking it. Did not help reduce the frequency of exacerbation.  Was given prednisone by PCP yesterday and started it last night.   Intolerant of daliresp in the past  He did pulmonary rehab at [facility] last year and unsure how much it helped his breathing.   He has also been in A. Fib for the past 6 months and this has worsened his breathing.   Using albuterol inhaler use is about twice a day, depending on when he leaves the house.    I have reviewed the patient's family social and past medical history and updated as appropriate.   Past Medical History:  Diagnosis Date   AAA (abdominal aortic aneurysm) (HCC)    Anxiety    due to pain in leg   Arrhythmia    Arthritis    Osteo   Bell's palsy    Cancer (HCC)    Bladder cancer   Cataracts, bilateral    CHF (congestive heart failure) (HCC)    COPD (chronic obstructive pulmonary disease) (HCC)    O2 supplementation dependent   Diverticulitis    Enlarged prostate    takes Flomax daily   GERD (gastroesophageal reflux disease)    Headache    migraines years ago   History of bronchitis    > 67yr ago   History of colon polyps    benign   History of dizziness    several yrs ago-was on Meclizine but has been off for several yrs-no problems since   History of  hiatal hernia    Hypertension    takes Amlodipine and Lisinpril daily   Joint pain    Joint swelling    Nocturia    Oxygen deficiency    2 1/2 L   Oxygen dependent    2 1/2 L   PAD (peripheral artery disease) (HCC)     bilateral superficial femoral artery occlusions    Paroxysmal atrial fibrillation (HCC)    Pneumonia    several times with most recent within 5 yrs ago   Restless leg syndrome    Rupture of left quadriceps tendon    Shortness of breath dyspnea    with exertion   Sleep apnea    does not use cpap   Urinary urgency     Past Surgical History:  Procedure Laterality Date   BICEPS TENDON REPAIR     LEFT   CARDIOVERSION N/A 02/06/2017   Procedure: CARDIOVERSION;  Surgeon: SJerline Pain MD;  [facility];  Service: Cardiovascular;  Laterality: N/A;   CARPAL TUNNEL RELEASE Right 2014   CHOLECYSTECTOMY  July 16,2010   COLONOSCOPY  2003   6 polyps, left sided diverticula. Path with hyperplastic polyps per review of  colonoscopy report in 2011.    COLONOSCOPY  03/2009   Scattered left sided diverticula.    COLONOSCOPY WITH PROPOFOL N/A 08/27/2020   Procedure: COLONOSCOPY WITH PROPOFOL;  Surgeon: Daneil Dolin, MD;  [facility];  Service: Endoscopy;  Laterality: N/A;  11:00am   CYSTOSCOPY     ESOPHAGOGASTRODUODENOSCOPY     ESOPHAGOGASTRODUODENOSCOPY (EGD) WITH PROPOFOL N/A 08/27/2020   Procedure: ESOPHAGOGASTRODUODENOSCOPY (EGD) WITH PROPOFOL;  Surgeon: Daneil Dolin, MD;  [facility];  Service: Endoscopy;  Laterality: N/A;   FEMORAL-POPLITEAL BYPASS GRAFT Right 09/19/2015   Procedure: RIGHT COMMON FEMORAL-ABOVE KNEE POPLITEAL ARTERY    BYPASS GRAFT ;  Surgeon: Conrad Ossian, MD;  Location: [place];  Service: Vascular;  Laterality: Right;   FOOT SURGERY Right    x 5t   HAND SURGERY Right    HERNIA REPAIR Bilateral    inguinal   HOT HEMOSTASIS  08/27/2020   Procedure: HOT HEMOSTASIS (ARGON PLASMA COAGULATION/BICAP);  Surgeon: Daneil Dolin, MD;  [facility];  Service: Endoscopy;;   PERIPHERAL VASCULAR CATHETERIZATION N/A 08/02/2015   Procedure: Abdominal Aortogram w/Lower Extremity;  Surgeon: Conrad Herrick, MD;  Location: [facility];  Service: Cardiovascular;  Laterality: N/A;   POLYPECTOMY  08/27/2020   Procedure: POLYPECTOMY INTESTINAL;  Surgeon: Daneil Dolin, MD;  [facility];  Service: Endoscopy;;   QUADRICEPS TENDON REPAIR Left 08/22/2016   Procedure: LEFT OPEN QUADRICEP TENDON REPAIR;  Surgeon: Netta Cedars, MD;  Location: [place];  Service: Orthopedics;  Laterality: Left;   REVERSE SHOULDER ARTHROPLASTY Right 07/07/2014   Procedure: RIGHT REVERSE TOTAL SHOULDER ;  Surgeon: Netta Cedars, MD;  Location: [place];  Service: Orthopedics;  Laterality: Right;   REVERSE TOTAL SHOULDER ARTHROPLASTY Right 07/07/2014   Dr Veverly Fells   ROTATOR CUFF REPAIR     Bilateral    TEE WITHOUT CARDIOVERSION N/A 02/06/2017   Procedure: TRANSESOPHAGEAL ECHOCARDIOGRAM (TEE);  Surgeon: Jerline Pain, MD;  Location[facility];  Service: Cardiovascular;  Laterality: N/A;   TONSILLECTOMY     tumor removed from bladder     VEIN HARVEST Right 09/19/2015   Procedure: WITH NON REVERSE RIGHT GREATER SAPHENOUS VEIN HARVEST;  Surgeon: Conrad Deerfield, MD;  Location: [place];  Service: Vascular;  Laterality: Right;    Family History  Problem Relation Age of Onset   Parkinson's disease Mother    AAA (abdominal aortic aneurysm) Father    Heart defect Sister        Congenital Heart Disease   Heart disease Sister     Social History   Occupational History   Not on file  Tobacco Use   Smoking status: Some Days    Packs/day: 0.25    Years: 45.00    Total pack years: 11.25    Types: Cigars, Cigarettes    Last attempt to quit: 01/07/2019    Years since quitting: 2.8   Smokeless tobacco: Never   Tobacco comments:    smokes 1/3 of a cigar a day- 11/14/21  Vaping Use   Vaping Use: Never used  Substance and Sexual  Activity   Alcohol use: Yes    Alcohol/week: 0.0 standard drinks of alcohol    Comment: wine occasionally   Drug use: Not Currently    Types: Marijuana   Sexual activity: Yes    Physical Exam: Blood pressure 130/80, pulse 85, height '5\' 11"'$  (1.803 m), weight 232 lb 3.2 oz (105.3 kg), SpO2 94 %.  Gen: chronically ill appearing, in wheel chair, no respiratory distress, cushingoid, on nasal cannula Resp: diminished, clear CV: diminished, irregularly irregular Ext: no edema, thin skin    Data Reviewed: Imaging: I have personally reviewed the chest xray March 2021 reveals no acute cardiopulmonary process  Echocardiogram Dec 2020  1. Left ventricular ejection fraction, by visual estimation, is 60 to  65%. The left ventricle has normal function. There is borderline left  ventricular hypertrophy.   2. Left ventricular diastolic parameters are indeterminate.   3. The left ventricle has no regional wall motion abnormalities.   4. Global right ventricle has normal systolic function.The right  ventricular size is normal. No increase in right ventricular wall  thickness.   5. Left atrial size was normal.   6. Right atrial size was normal.   7. Presence of pericardial fat pad.   8. Trivial pericardial effusion is present.   9. The mitral valve is normal in structure. Trivial mitral valve  regurgitation.  10. The tricuspid valve is normal in structure. Tricuspid valve  regurgitation is trivial.  11. The aortic valve is normal in structure. Aortic valve regurgitation is  not visualized. Mild aortic valve sclerosis without stenosis.  12. The pulmonic valve was not well visualized. Pulmonic valve  regurgitation is not visualized.  13. The atrial septum is grossly normal.   PFTs:      Latest Ref Rng & Units 03/12/2018    9:54 AM  PFT Results  FVC-Pre L 3.08   FVC-Predicted Pre % 72   FVC-Post L 3.66   FVC-Predicted Post % 85   Pre FEV1/FVC % % 59   Post FEV1/FCV % % 56    FEV1-Pre L 1.81   FEV1-Predicted Pre % 58   FEV1-Post L 2.05   DLCO uncorrected ml/min/mmHg 14.19   DLCO UNC% % 42   DLVA Predicted % 50   TLC L 7.95   TLC % Predicted % 109   RV % Predicted % 178    I have personally reviewed the patient's PFTs and my interpretation is moderately severe airflow limitation with borderline BD response consistent with COPD. FEV1 58% of predicted  Labs: A1AT within normal range Lab Results  Component Value Date   WBC 17.5 (H) 09/03/2021   HGB 14.7 09/03/2021   HCT 48.6 09/03/2021   MCV 84.8 09/03/2021   PLT 326 09/03/2021     Immunization status: Immunization History  Administered Date(s) Administered   Fluad Quad(high Dose 65+) 12/27/2019   Influenza, High Dose Seasonal PF 12/12/2015, 01/08/2017, 12/22/2017, 02/22/2019   Moderna Sars-Covid-2 Vaccination 05/06/2019, 06/03/2019   Tdap 08/15/2016    Assessment:  Moderately Severe COPD FEV1 58% with acute exacerbation and progression of disease.  Chronic Hypoxemic Respiratory Failure on 2.5LNC  HFpEF Atrial Fibrillation, rate controlled on eliquis Goals of Care discussion - DNR/DNI    Plan/Recommendations:  Continue symbicort inhaler  I am switching you from albuterol to levalbuterol or xopanex for your rescue treatment (inhaler and nebulizer.) this is because of your atrial fibrillation.  We can try some saline nebulizer treatments with flutter valve which you can take up to 3 times a day to see if this helps break up the cough and congestion.   Finish the prednisone from Dr. Willey Blade.   BODE score 7 which gives him 4 year survival of 18%. I suspect he has had progression and decline in his lung function since 2019 and that actual FEV1 is less than 58%. So he may have even  less probability of survival than that.   We talked about his end of life wishes in the event of clinical decline including need for hospitalization and he would like to be DNR and DNI. This is medically appropriate  given his advanced lung disease  I am referring you to outpatient palliative care to help with symptom management of your chronic condition- COPD. This is different from hospice which is for end of life. They can help fill out a MOST form.   I spent 40 minutes in the care of this patient today including pre-charting, chart review, review of results, face-to-face care, coordination of care and communication with consultants etc.).   Return to Care: Return in about 3 months (around 02/14/2022).     Lenice Llamas, MD Pulmonary and [place]

## 2021-11-14 NOTE — Patient Instructions (Addendum)
Please schedule follow up scheduled with myself in 3 months.  Come see me before Thanksgiving. If my schedule is not open yet, we will contact you with a reminder closer to that time. Please call [phone] if you haven't heard from [place] a month before.   I am switching you from albuterol to levalbuterol or xopanex for your rescue treatment (inhaler and nebulizer.) this is because of your atrial fibrillation.  Continue symbicort inhaler  I am referring you to outpatient palliative care to help with symptom management of your chronic condition- COPD. This is different from hospice which is for end of life.   We can try some saline nebulizer treatments which you can take up to 3 times a day to see if this helps break up the cough and congestion. You can use the flutter valve after you use these treatments to see if it helps you bring up the cough.   Finish the prednisone from Dr. Willey Blade.

## 2021-11-26 ENCOUNTER — Encounter: Payer: Self-pay | Admitting: Nurse Practitioner

## 2021-11-26 ENCOUNTER — Other Ambulatory Visit: Payer: PPO | Admitting: Nurse Practitioner

## 2021-11-26 DIAGNOSIS — Z515 Encounter for palliative care: Secondary | ICD-10-CM

## 2021-11-26 DIAGNOSIS — R5381 Other malaise: Secondary | ICD-10-CM

## 2021-11-26 DIAGNOSIS — R0602 Shortness of breath: Secondary | ICD-10-CM

## 2021-11-26 NOTE — Progress Notes (Signed)
[facility] Consult Note Telephone: [phone]  Fax: [phone]   Date of encounter: 11/26/21 3:18 PM PATIENT NAME: Derrick Moon [address] [place] [address]   [phone] (home)  DOB: 10/06/1940 MRN: [id number] PRIMARY CARE PROVIDER:    [name], MD,  [address] [place] [address] [phone]  REFERRING PROVIDER:   [name], MD [address] [place],  [place] [address] [phone]  RESPONSIBLE PARTY:    Contact Information     Name Relation Home Work Mobile   [place] Spouse [phone]  [phone]      Due to the COVID-19 crisis, this visit was done via telemedicine from my office and it was initiated and consent by this patient and or family.  I connected with Derrick Moon and  NAHSIR VENEZIA OR PROXY on 11/26/21 by telephone as video not available enabled telemedicine application and verified that I am speaking with the correct person using two identifiers.   I discussed the limitations of evaluation and management by telemedicine. The patient expressed understanding and agreed to proceed. . Palliative Care was asked to follow this patient by consultation request of  [name], MD to address advance care planning and complex medical decision making. This is the initial visit.                          ASSESSMENT AND PLAN / RECOMMENDATIONS:  Symptom Management/Plan: 1. Advance Care Planning;  Ongoing discussions  2. Goals of Care: Goals include to maximize quality of life and symptom management. Our advance care planning conversation included a discussion about:    The value and importance of advance care planning  Exploration of personal, cultural or spiritual beliefs that might influence medical decisions  Exploration of goals of care in the event of a sudden injury or illness  Identification and preparation of a healthcare agent  Review and updating or creation of an advance  directive document.  3. Debility secondary to deconditioning with COPD, CHF; recent hospitalization. Discussed mobility which is limited with disease progression, continue to monitor.  10/31/2021 weight 231 lbs BMI 32.22  4. Shortness of breath secondary to COPD, CHF with ongoing tobacco abuse disorder. Symptomatic, continue to monitor respiratory status, O2 supplemental, inhalation therapy, Continue to f/u with pulmonology.   5. Palliative care encounter; Palliative care encounter; Palliative medicine team will continue to support patient, patient's family, and medical team. Visit consisted of counseling and education dealing with the complex and emotionally intense issues of symptom management and palliative care in the setting of serious and potentially life-threatening illness  Follow up Palliative Care Visit: Palliative care will continue to follow for complex medical decision making, advance care planning, and clarification of goals. Return 4 weeks or prn.  I spent 32 minutes providing this consultation. More than 50% of the time in this consultation was spent in counseling and care coordination. PPS: 40% Chief Complaint: Follow up palliative consult for complex medical decision making, address goals, manage ongoing symptoms  HISTORY OF PRESENT ILLNESS:  Derrick Moon is a 81 y.o. year old male  with multiple medical problems including  hypertension, COPD, atrial fibrillation, enlarged prostate, and AAA, superficial bladder cancer (2010). I called Derrick Moon and Derrick Moon for telemedicine telephonic PC visit. We talked about the last time Derrick Moon was active, independent. We talked about past medical history, hospitalizations, recent visit with Urology, Pulmonary. We talked about ros, shortness of  breath at length. We talked about appetite, daily routine. We talked about quality of life, medical goals, role pc in poc. Discussed will schedule f/u pc visit with [facility] RN for further evaluation,  discussions of goc, currently receiving therapy. Derrick Moon and Derrick Moon Moon in agreement, scheduled f/u NP visit also. Therapeutic listening, emotional support provided. Questions answered, contact information provided.   History obtained from review of EMR, discussion Derrick Moon.  I reviewed available labs, medications, imaging, studies and related documents from the EMR.  Records reviewed and summarized above.   ROS 10 point system reviewed all negative except HPI  Physical Exam: Deferred  CURRENT PROBLEM LIST:  Patient Active Problem List   Diagnosis Date Noted   Lower leg edema 06/14/2019   Leukocytosis 06/14/2019   Enlarged prostate    GERD (gastroesophageal reflux disease)    B12 deficiency 02/10/2019   IDA (iron deficiency anemia) 02/08/2019   Abdominal distention 02/08/2019   Tobacco use 01/01/2018   COPD exacerbation ([place]) 12/31/2017   Otitis media 12/31/2017   Bronchiectasis without complication ([place]) 00/17/4944   Acute maxillary sinusitis 08/20/2017   Cough variant asthma vs uacs  07/17/2017   Abnormal thyroid blood test 05/07/2017   Pulmonary infiltrates 02/20/2017   PAF (paroxysmal atrial fibrillation) ([place]) 02/02/2017   COPD with acute exacerbation ([place]) 02/02/2017   Acute respiratory failure with hypoxemia (HCC) 02/02/2017   Atrial fibrillation with RVR ([place]) 02/02/2017   Acute on chronic respiratory failure ([place]) 02/02/2017   SIRS (systemic inflammatory response syndrome) ([place]) 02/02/2017   Cigarette smoker 12/14/2016   DOE (dyspnea on exertion) 12/12/2015   COPD GOLD II  12/12/2015   Atherosclerosis of native arteries of right leg with ulceration of other part of foot ([place]) 09/19/2015   Atherosclerosis of native arteries of the extremities with ulceration ([place]) 07/27/2015   Malignant neoplasm of lateral wall of urinary bladder ([place]) 03/12/2015   S/P shoulder replacement 07/07/2014   Benign non-nodular prostatic hyperplasia with lower urinary tract symptoms  03/10/2014   Aortic ectasia, abdominal ([place]) 02/26/2012   Essential hypertension 02/26/2009   History of diverticulitis 02/26/2009   PAST MEDICAL HISTORY:  Active Ambulatory Problems    Diagnosis Date Noted   Essential hypertension 02/26/2009   History of diverticulitis 02/26/2009   Aortic ectasia, abdominal ([place]) 02/26/2012   S/P shoulder replacement 07/07/2014   Atherosclerosis of native arteries of the extremities with ulceration ([place]) 07/27/2015   Atherosclerosis of native arteries of right leg with ulceration of other part of foot ([place]) 09/19/2015   DOE (dyspnea on exertion) 12/12/2015   COPD GOLD II  12/12/2015   Cigarette smoker 12/14/2016   PAF (paroxysmal atrial fibrillation) ([place]) 02/02/2017   COPD with acute exacerbation ([place]) 02/02/2017   Acute respiratory failure with hypoxemia ([place]) 02/02/2017   Atrial fibrillation with RVR ([place]) 02/02/2017   Acute on chronic respiratory failure ([place]) 02/02/2017   SIRS (systemic inflammatory response syndrome) ([place]) 02/02/2017   Pulmonary infiltrates 02/20/2017   Abnormal thyroid blood test 05/07/2017   Cough variant asthma vs uacs  07/17/2017   Acute maxillary sinusitis 08/20/2017   Bronchiectasis without complication ([place] ) 96/75/9163   COPD exacerbation ([place]) 12/31/2017   Otitis media 12/31/2017   Tobacco use 01/01/2018   IDA (iron deficiency anemia) 02/08/2019   Abdominal distention 02/08/2019   B12 deficiency 02/10/2019   Enlarged prostate    GERD (gastroesophageal reflux disease)    Lower leg edema 06/14/2019   Leukocytosis 06/14/2019   Benign non-nodular prostatic hyperplasia with lower urinary  tract symptoms 03/10/2014   Malignant neoplasm of lateral wall of urinary bladder ([place]) 03/12/2015   Resolved Ambulatory Problems    Diagnosis Date Noted   No Resolved Ambulatory Problems   Past Medical History:  Diagnosis Date   AAA (abdominal aortic aneurysm) (HCC)    Anxiety    Arrhythmia    Arthritis    Bell's palsy     Cancer (HCC)    Cataracts, bilateral    CHF (congestive heart failure) (HCC)    COPD (chronic obstructive pulmonary disease) (HCC)    Diverticulitis    Headache    History of bronchitis    History of colon polyps    History of dizziness    History of hiatal hernia    Hypertension    Joint pain    Joint swelling    Nocturia    Oxygen deficiency    Oxygen dependent    PAD (peripheral artery disease) (HCC)    Paroxysmal atrial fibrillation (HCC)    Pneumonia    Restless leg syndrome    Rupture of left quadriceps tendon    Shortness of breath dyspnea    Sleep apnea    Urinary urgency    SOCIAL HX:  Social History   Tobacco Use   Smoking status: Some Days    Packs/day: 0.25    Years: 45.00    Total pack years: 11.25    Types: Cigars, Cigarettes    Last attempt to quit: 01/07/2019    Years since quitting: 2.8   Smokeless tobacco: Never   Tobacco comments:    smokes 1/3 of a cigar a day- 11/14/21  Substance Use Topics   Alcohol use: Yes    Alcohol/week: 0.0 standard drinks of alcohol    Comment: wine occasionally   FAMILY HX:  Family History  Problem Relation Age of Onset   Parkinson's disease Mother    AAA (abdominal aortic aneurysm) Father    Heart defect Sister        Congenital Heart Disease   Heart disease Sister       ALLERGIES:  Allergies  Allergen Reactions   Adhesive [Tape] Other (See Comments)    SKIN IS VERY THIN AND IT TEARS VERY EASILY!!! Please use an alternative   Aspartame And Phenylalanine Nausea Only   Other Rash    Pt reports rash on back, buttocks, shoulders and stomach after arteriogram   Sulfonamide Derivatives Nausea And Vomiting     PERTINENT MEDICATIONS:  Outpatient Encounter Medications as of 11/26/2021  Medication Sig   amLODipine (NORVASC) 5 MG tablet Take 5 mg by mouth daily.   apixaban (ELIQUIS) 5 MG TABS tablet Take 1 tablet (5 mg total) 2 (two) times daily by mouth.   bisacodyl (DULCOLAX) 5 MG EC tablet Take 5 mg by mouth  daily as needed for moderate constipation.   diphenhydrAMINE (BENADRYL) 25 MG tablet Take 25 mg by mouth daily as needed for itching or allergies.   famotidine (PEPCID) 20 MG tablet TAKE (1) TABLET BY MOUTH AT BEDTIME. (Patient taking differently: Take 20 mg by mouth at bedtime. TAKE (1) TABLET BY MOUTH AT BEDTIME.)   guaiFENesin (MUCINEX) 600 MG 12 hr tablet Take 600 mg by mouth 2 (two) times daily.   levalbuterol (XOPENEX HFA) 45 MCG/ACT inhaler Inhale 2 puffs into the lungs every 6 (six) hours as needed for wheezing.   levalbuterol (XOPENEX) 1.25 MG/3ML nebulizer solution Take 1.25 mg by nebulization every 4 (four) hours as needed for wheezing.  levocetirizine (XYZAL) 5 MG tablet Take 5 mg by mouth at bedtime. (Patient not taking: Reported on 11/14/2021)   losartan (COZAAR) 50 MG tablet Take 50 mg by mouth at bedtime.   metoprolol tartrate (LOPRESSOR) 25 MG tablet Take 0.5 tablets (12.5 mg total) by mouth 2 (two) times daily. (Patient taking differently: Take 12.5 mg by mouth daily.)   montelukast (SINGULAIR) 10 MG tablet TAKE (1) TABLET BY MOUTH AT BEDTIME. (Patient taking differently: Take 10 mg by mouth at bedtime.)   ondansetron (ZOFRAN) 4 MG tablet Take 1 tablet (4 mg total) by mouth every 6 (six) hours. As needed for nausea vomiting   oxyCODONE-acetaminophen (PERCOCET/ROXICET) 5-325 MG tablet Take 1 tablet by mouth every 4 (four) hours as needed.   pantoprazole (PROTONIX) 40 MG tablet TAKE 1 TABLET BY MOUTH 30 TO 60 MINUTES PRIOR TO THE FIRST MEAL OF THE DAY. (Patient taking differently: Take 40 mg by mouth daily before breakfast.)   polyethylene glycol-electrolytes (NULYTELY) 420 g solution As directed   potassium chloride (KLOR-CON) 20 MEQ tablet Take 1 tablet (20 mEq total) by mouth 2 (two) times daily. (Patient taking differently: Take 10 mEq by mouth 2 (two) times daily.)   predniSONE (DELTASONE) 10 MG tablet Take by mouth.   sodium chloride 0.9 % nebulizer solution Take 3 mLs by  nebulization as needed for wheezing.   SYMBICORT 160-4.5 MCG/ACT inhaler INHALE 2 PUFFS TWICE DAILY.   Tamsulosin HCl (FLOMAX) 0.4 MG CAPS Take 0.4 mg by mouth daily after supper.    torsemide (DEMADEX) 20 MG tablet Take 2 tablets (40 mg total) by mouth daily. (Patient taking differently: Take 40 mg by mouth daily as needed (fluid, swelling).)   No facility-administered encounter medications on file as of 11/26/2021.   Thank you for the opportunity to participate in the care of Derrick Moon. [place].  The palliative care team will continue to follow. Please call our office at [phone] if we can be of additional assistance.   Aerianna Losey Z Magdelyn Roebuck, NP ,

## 2021-11-28 ENCOUNTER — Telehealth: Payer: Self-pay

## 2021-11-28 NOTE — Telephone Encounter (Signed)
910 am.  Request from [place], NP to follow up with patient in 1-2 weeks to review GOC and ACP.  Spoke with wife [name] and visit is scheduled for next Tuesday.

## 2021-12-03 ENCOUNTER — Other Ambulatory Visit: Payer: PPO

## 2021-12-03 VITALS — HR 90

## 2021-12-03 DIAGNOSIS — Z515 Encounter for palliative care: Secondary | ICD-10-CM

## 2021-12-03 NOTE — Progress Notes (Signed)
PATIENT NAME: Derrick Moon DOB: 12/06/1940 MRN: [id number]  PRIMARY CARE PROVIDER: [name], MD  RESPONSIBLE PARTY:  Acct ID - Guarantor Home Phone Work Phone Relationship Acct Type  [id number] Derrick Moon, [name]  Self P/F     [place], [place], [place] [address]    Joint visit completed with patient and wife.  Atrial Fibrillation:  Patient states he is currently in afib.  Has an initial visit with cardiology in the next couple of weeks.  Previous cardiology either moved or retired.  Patient believes he has been in afib for about 6 months.  Occasional issues with pressure in the chest but no other problems reported.    GOC:  Discussed with patient.  Believes he has a DNR but not accessible at this time. Patient would like to have another form.  Reviewed MOST form and blank form left in the home.  Patient will complete with Palliative Care NP on visit later this month.  Discussed differences between [facility] and Hospice.  Patient advised he would NOT want hospice and is very familiar with services.    Dyspnea:  Patient sitting outside on the front porch.  No oxygen in place.  Patient visibly short of breath with pursed lip breathing. Discussed use of oxygen, purpose and frequency.  Patient states is comes outside almost daily to smoke a cigar without oxygen.  One cigar typically last him 2 days.  Patient states this helps him clear chest congestion that is present when he first awakens in the am.  We discussed use of the flutter valve and nebulizer.  Patient confirms he is using both.  Patient would like information on portable oxygen like [employer].  Advised that I would follow up with his DME provider-[employer].   Loss of Independence:  Patient shared his life story during this visit.  He reflects back on a very full life and the many adventures he has had.  Patient became tearful and states he still has a "bucket list" but does not think he will be able to complete them.  He is ambulatory  with a rolling walker but requires assistance with showers due to his dyspnea and fatigue.  Update provided to Derrick Gusler, NP.    HISTORY OF PRESENT ILLNESS:  81 year old male with COPD.  Patient is followed by Palliative Care every 4-8 weeks and PRN.   CODE STATUS: Desires DNR-needs additional form ADVANCED DIRECTIVES: Yes-not on file MOST FORM: No PPS: 50%   PHYSICAL EXAM:   VITALS: Today's Vitals   12/03/21 1353  Pulse: 90  SpO2: 91%    LUNGS: decreased breath sounds, expiratory wheezes bilaterally, scattered rhonchi bilaterally CARDIAC: Cor irreg, irreg RRR}  EXTREMITIES: - for edema SKIN: Skin color, texture, turgor normal. No rashes or lesions or mobility and turgor normal  NEURO: positive for dizziness and gait problems       Lorenza Burton, RN

## 2021-12-09 ENCOUNTER — Other Ambulatory Visit: Payer: PPO

## 2021-12-09 DIAGNOSIS — Z515 Encounter for palliative care: Secondary | ICD-10-CM

## 2021-12-09 NOTE — Progress Notes (Signed)
PATIENT NAME: BORA BRONER DOB: [birth year] MRN: [id number]  PRIMARY CARE PROVIDER: [name], MD  RESPONSIBLE PARTY:  Acct ID - Guarantor Home Phone Work Phone Relationship Acct Type  [id number] ALBIE, [phone]  Self P/F     [place], [place], [place] [address]   Home visit completed with patient.  Discussed portable oxygen concentrator and updated on process as explained by [facility] with [employer].  Patient agreeable to move forward and would like to have an assessment completed to see if he qualifies.  New order obtained from Natalia Leatherwood, NP for POC assessment.  Order faxed to [employer] for processing.  Patient aware he should hear from therapy this week.  Wife asking about vaccinations for patient.  Encouraged her to contact cardiology office as they have an upcoming appointment this week.  [facility] Department, local pharmacies or PCP office also offered as possible options.         Lorenza Burton, RN

## 2021-12-12 ENCOUNTER — Ambulatory Visit: Payer: PPO | Attending: Cardiology | Admitting: Cardiology

## 2021-12-12 ENCOUNTER — Encounter: Payer: Self-pay | Admitting: Cardiology

## 2021-12-12 VITALS — BP 104/62 | HR 94 | Ht 71.0 in | Wt 229.2 lb

## 2021-12-12 DIAGNOSIS — I4819 Other persistent atrial fibrillation: Secondary | ICD-10-CM | POA: Diagnosis not present

## 2021-12-12 DIAGNOSIS — I5032 Chronic diastolic (congestive) heart failure: Secondary | ICD-10-CM | POA: Diagnosis not present

## 2021-12-12 MED ORDER — LOSARTAN POTASSIUM 25 MG PO TABS: 25.0000 mg | ORAL_TABLET | Freq: Every day | ORAL | 3 refills | Status: AC

## 2021-12-12 MED ORDER — METOPROLOL TARTRATE 25 MG PO TABS: 25.0000 mg | ORAL_TABLET | Freq: Two times a day (BID) | ORAL | 3 refills | Status: AC

## 2021-12-12 NOTE — Progress Notes (Signed)
Clinical Summary Derrick Moon is a 81 y.o.male former patient of Dr Bronson Ing, this is our first visit together.  1.Chronic HFpEF - 02/2019 echo: LVEF 53-29%, indet diastolic, normal RV, normal LA - occasonal LE edema in legs, has torsemide '20mg'$  prn and resolves the swelling.  - 07/2021 235, today 229 lbs.   2.COPD - followed by pulmonary - recent not mentions home palliative care referal  3. PAF - denies any specific palipitations - notices high heart rates with mild exertion   4. HTN - compliant with meds   5. PAD - followed by vascular  6.Nonobstructive CAD -nonobstructive disease by 2019 CTA - no chest pains.  - has not been on statin, labs followed by pcp   7. OSA Past Medical History:  Diagnosis Date   AAA (abdominal aortic aneurysm) (HCC)    Anxiety    due to pain in leg   Arrhythmia    Arthritis    Osteo   Bell's palsy    Cancer (HCC)    Bladder cancer   Cataracts, bilateral    CHF (congestive heart failure) (HCC)    COPD (chronic obstructive pulmonary disease) (HCC)    O2 supplementation dependent   Diverticulitis    Enlarged prostate    takes Flomax daily   GERD (gastroesophageal reflux disease)    Headache    migraines years ago   History of bronchitis    > 61yr ago   History of colon polyps    benign   History of dizziness    several yrs ago-was on Meclizine but has been off for several yrs-no problems since   History of hiatal hernia    Hypertension    takes Amlodipine and Lisinpril daily   Joint pain    Joint swelling    Nocturia    Oxygen deficiency    2 1/2 L   Oxygen dependent    2 1/2 L   PAD (peripheral artery disease) (HCC)     bilateral superficial femoral artery occlusions    Paroxysmal atrial fibrillation (HCC)    Pneumonia    several times with most recent within 5 yrs ago   Restless leg syndrome    Rupture of left quadriceps tendon    Shortness of breath dyspnea    with exertion   Sleep apnea    does not  use cpap   Urinary urgency      Allergies  Allergen Reactions   Adhesive [Tape] Other (See Comments)    SKIN IS VERY THIN AND IT TEARS VERY EASILY!!! Please use an alternative   Aspartame And Phenylalanine Nausea Only   Other Rash    Pt reports rash on back, buttocks, shoulders and stomach after arteriogram   Sulfonamide Derivatives Nausea And Vomiting     Current Outpatient Medications  Medication Sig Dispense Refill   amLODipine (NORVASC) 5 MG tablet Take 5 mg by mouth daily.     apixaban (ELIQUIS) 5 MG TABS tablet Take 1 tablet (5 mg total) 2 (two) times daily by mouth. 60 tablet 0   bisacodyl (DULCOLAX) 5 MG EC tablet Take 5 mg by mouth daily as needed for moderate constipation.     diphenhydrAMINE (BENADRYL) 25 MG tablet Take 25 mg by mouth daily as needed for itching or allergies.     famotidine (PEPCID) 20 MG tablet TAKE (1) TABLET BY MOUTH AT BEDTIME. (Patient taking differently: Take 20 mg by mouth at bedtime. TAKE (1) TABLET BY MOUTH AT  BEDTIME.) 30 tablet 0   guaiFENesin (MUCINEX) 600 MG 12 hr tablet Take 600 mg by mouth 2 (two) times daily.     levalbuterol (XOPENEX HFA) 45 MCG/ACT inhaler Inhale 2 puffs into the lungs every 6 (six) hours as needed for wheezing. 1 each 12   levalbuterol (XOPENEX) 1.25 MG/3ML nebulizer solution Take 1.25 mg by nebulization every 4 (four) hours as needed for wheezing. 72 mL 12   levocetirizine (XYZAL) 5 MG tablet Take 5 mg by mouth at bedtime. (Patient not taking: Reported on 11/14/2021)     losartan (COZAAR) 50 MG tablet Take 50 mg by mouth at bedtime.     metoprolol tartrate (LOPRESSOR) 25 MG tablet Take 0.5 tablets (12.5 mg total) by mouth 2 (two) times daily. (Patient taking differently: Take 12.5 mg by mouth daily.) 30 tablet 3   montelukast (SINGULAIR) 10 MG tablet TAKE (1) TABLET BY MOUTH AT BEDTIME. (Patient taking differently: Take 10 mg by mouth at bedtime.) 30 tablet 1   ondansetron (ZOFRAN) 4 MG tablet Take 1 tablet (4 mg total) by  mouth every 6 (six) hours. As needed for nausea vomiting 12 tablet 0   oxyCODONE-acetaminophen (PERCOCET/ROXICET) 5-325 MG tablet Take 1 tablet by mouth every 4 (four) hours as needed. 12 tablet 0   pantoprazole (PROTONIX) 40 MG tablet TAKE 1 TABLET BY MOUTH 30 TO 60 MINUTES PRIOR TO THE FIRST MEAL OF THE DAY. (Patient taking differently: Take 40 mg by mouth daily before breakfast.) 30 tablet 0   polyethylene glycol-electrolytes (NULYTELY) 420 g solution As directed 4000 mL 0   potassium chloride (KLOR-CON) 20 MEQ tablet Take 1 tablet (20 mEq total) by mouth 2 (two) times daily. (Patient taking differently: Take 10 mEq by mouth 2 (two) times daily.) 60 tablet 1   predniSONE (DELTASONE) 10 MG tablet Take by mouth.     sodium chloride 0.9 % nebulizer solution Take 3 mLs by nebulization as needed for wheezing. 90 mL 12   SYMBICORT 160-4.5 MCG/ACT inhaler INHALE 2 PUFFS TWICE DAILY. 10.2 g 0   Tamsulosin HCl (FLOMAX) 0.4 MG CAPS Take 0.4 mg by mouth daily after supper.      torsemide (DEMADEX) 20 MG tablet Take 2 tablets (40 mg total) by mouth daily. (Patient taking differently: Take 40 mg by mouth daily as needed (fluid, swelling).) 60 tablet 2   No current facility-administered medications for this visit.     Past Surgical History:  Procedure Laterality Date   BICEPS TENDON REPAIR     LEFT   CARDIOVERSION N/A 02/06/2017   Procedure: CARDIOVERSION;  Surgeon: Jerline Pain, MD;  Location: [facility] ENDOSCOPY;  Service: Cardiovascular;  Laterality: N/A;   CARPAL TUNNEL RELEASE Right 2014   CHOLECYSTECTOMY  July 16,2010   COLONOSCOPY  2003   6 polyps, left sided diverticula. Path with hyperplastic polyps per review of colonoscopy report in 2011.    COLONOSCOPY  03/2009   Scattered left sided diverticula.    COLONOSCOPY WITH PROPOFOL N/A 08/27/2020   Procedure: COLONOSCOPY WITH PROPOFOL;  Surgeon: Daneil Dolin, MD;  [facility] ENDO SU[facility];  Service: Endoscopy;  Laterality: N/A;  11:00am    CYSTOSCOPY     ESOPHAGOGASTRODUODENOSCOPY     ESOPHAGOGASTRODUODENOSCOPY (EGD) WITH PROPOFOL N/A 08/27/2020   Procedure: ESOPHAGOGASTRODUODENOSCOPY (EGD) WITH PROPOFOL;  Surgeon: Daneil Dolin, MD;  [facility]: [facility] ENDO [facility];  Service: Endoscopy;  Laterality: N/A;   FEMORAL-POPLITEAL BYPASS GRAFT Right 09/19/2015   Procedure: RIGHT COMMON FEMORAL-ABOVE KNEE POPLITEAL ARTERY  BYPASS GRAFT ;  Surgeon: Conrad Summerfield, MD;  Location: [facility];  Service: Vascular;  Laterality: Right;   FOOT SURGERY Right    x 5t   HAND SURGERY Right    HERNIA REPAIR Bilateral    inguinal   HOT HEMOSTASIS  08/27/2020   Procedure: HOT HEMOSTASIS (ARGON PLASMA COAGULATION/BICAP);  Surgeon: Daneil Dolin, MD;  Location: [facility] END[facility];  Service: Endoscopy;;   PERIPHERAL VASCULAR CATHETERIZATION N/A 08/02/2015   Procedure: Abdominal Aortogram w/Lower Extremity;  Surgeon: Conrad Floral Park, MD;  Location: [facility];  Service: Cardiovascular;  Laterality: N/A;   POLYPECTOMY  08/27/2020   Procedure: POLYPECTOMY INTESTINAL;  Surgeon: Daneil Dolin, MD;  [facility];  Service: Endoscopy;;   QUADRICEPS TENDON REPAIR Left 08/22/2016   Procedure: LEFT OPEN QUADRICEP TENDON REPAIR;  Surgeon: Netta Cedars, MD;  Location: [facility];  Service: Orthopedics;  Laterality: Left;   REVERSE SHOULDER ARTHROPLASTY Right 07/07/2014   Procedure: RIGHT REVERSE TOTAL SHOULDER ;  Surgeon: Netta Cedars, MD;  Location: [place];  Service: Orthopedics;  Laterality: Right;   REVERSE TOTAL SHOULDER ARTHROPLASTY Right 07/07/2014   Dr Veverly Fells   ROTATOR CUFF REPAIR     Bilateral    TEE WITHOUT CARDIOVERSION N/A 02/06/2017   Procedure: TRANSESOPHAGEAL ECHOCARDIOGRAM (TEE);  Surgeon: Jerline Pain, MD;  Location: [facility] ENDOSCOPY;  Service: Cardiovascular;  Laterality: N/A;   TONSILLECTOMY     tumor removed from bladder     VEIN HARVEST Right 09/19/2015   Procedure: WITH NON REVERSE RIGHT GREATER SAPHENOUS VEIN HARVEST;  Surgeon: Conrad Fallbrook, MD;   Location: [facility];  Service: Vascular;  Laterality: Right;     Allergies  Allergen Reactions   Adhesive [Tape] Other (See Comments)    SKIN IS VERY THIN AND IT TEARS VERY EASILY!!! Please use an alternative   Aspartame And Phenylalanine Nausea Only   Other Rash    Pt reports rash on back, buttocks, shoulders and stomach after arteriogram   Sulfonamide Derivatives Nausea And Vomiting      Family History  Problem Relation Age of Onset   Parkinson's disease Mother    AAA (abdominal aortic aneurysm) Father    Heart defect Sister        Congenital Heart Disease   Heart disease Sister      Social History Mr. Ciresi reports that he has been smoking cigars and cigarettes. He has a 11.25 pack-year smoking history. He has never used smokeless tobacco. Mr. Mogg reports current alcohol use.   Review of Systems CONSTITUTIONAL: No weight loss, fever, chills, weakness or fatigue.  HEENT: Eyes: No visual loss, blurred vision, double vision or yellow sclerae.No hearing loss, sneezing, congestion, runny nose or sore throat.  SKIN: No rash or itching.  CARDIOVASCULAR: per hpi RESPIRATORY: per hpi GASTROINTESTINAL: No anorexia, nausea, vomiting or diarrhea. No abdominal pain or blood.  GENITOURINARY: No burning on urination, no polyuria NEUROLOGICAL: No headache, dizziness, syncope, paralysis, ataxia, numbness or tingling in the extremities. No change in bowel or bladder control.  MUSCULOSKELETAL: No muscle, back pain, joint pain or stiffness.  LYMPHATICS: No enlarged nodes. No history of splenectomy.  PSYCHIATRIC: No history of depression or anxiety.  ENDOCRINOLOGIC: No reports of sweating, cold or heat intolerance. No polyuria or polydipsia.  [name]   Physical Examination Today's Vitals   12/12/21 1328  BP: 104/62  Pulse: 94  SpO2: 95%  Weight: 229 lb 3.2 oz (104 kg)  Height: '5\' 11"'$  (1.803 m)  Body mass index is 31.97 kg/m.  Gen: resting comfortably, no acute distress HEENT:  no scleral icterus, pupils equal round and reactive, no palptable cervical adenopathy,  CV: irreg, no m/r/ gno jvd Resp: Clear to auscultation bilaterally GI: abdomen is soft, non-tender, non-distended, normal bowel sounds, no hepatosplenomegaly MSK: extremities are warm, no edema.  Skin: warm, no rash Neuro:  no focal deficits Psych: appropriate affect   Diagnostic Studies  02/2019 echo  1. Left ventricular ejection fraction, by visual estimation, is 60 to  65%. The left ventricle has normal function. There is borderline left  ventricular hypertrophy.   2. Left ventricular diastolic parameters are indeterminate.   3. The left ventricle has no regional wall motion abnormalities.   4. Global right ventricle has normal systolic function.The right  ventricular size is normal. No increase in right ventricular wall  thickness.   5. Left atrial size was normal.   6. Right atrial size was normal.   7. Presence of pericardial fat pad.   8. Trivial pericardial effusion is present.   9. The mitral valve is normal in structure. Trivial mitral valve  regurgitation.  10. The tricuspid valve is normal in structure. Tricuspid valve  regurgitation is trivial.  11. The aortic valve is normal in structure. Aortic valve regurgitation is  not visualized. Mild aortic valve sclerosis without stenosis.  12. The pulmonic valve was not well visualized. Pulmonic valve  regurgitation is not visualized.  13. The atrial septum is grossly normal.    Jan 2019 coronary CTA IMPRESSION: 1.  Calcium score 64 which is 23 rd percentile for age and sex   2.  Non obstructive CAD see description above   3.  Normal aortic root 3.5 cm   4. Aortic Valve calcification suggest echo correlation if patient has murmur   Assessment and Plan   1.HFpEF - euvolemic today, doing well with just prn torsemide. We will continue  2. Persistent afib - 08/2020 EKG SR, 08/2021 EKG afib - EKG today show rate controlled afib  at 88  - discussed I don't think that his afib is playing a large role in his SOB/DOE, appears to be well rate controlled. He has some interest in trying a cardioversion but we discussed restoration of SR is not superior to rate control and high concern about sedation for him given his advanced O2 dependent COPD for which pulm has referred him to palliative care. I think best course would be to focus on rate control, increase lopressor to '25mg'$  bid. With low bp's at times lower losartan for room with bp        Arnoldo Lenis, M.D.

## 2021-12-12 NOTE — Patient Instructions (Signed)
Medication Instructions:  Your physician has recommended you make the following change in your medication:   -Increase Lopressor to 25 mg tablets twice daily -Decrease Losartan to 25 mg tablets daily.   Labwork: None  Testing/Procedures: None  Follow-Up: Follow up with Dr. Harl Bowie in 4 months.   Any Other Special Instructions Will Be Listed Below (If Applicable).     If you need a refill on your cardiac medications before your next appointment, please call your pharmacy.

## 2021-12-16 ENCOUNTER — Other Ambulatory Visit: Payer: Self-pay | Admitting: Internal Medicine

## 2021-12-23 ENCOUNTER — Other Ambulatory Visit: Payer: PPO | Admitting: Nurse Practitioner

## 2021-12-23 DIAGNOSIS — J449 Chronic obstructive pulmonary disease, unspecified: Secondary | ICD-10-CM | POA: Diagnosis not present

## 2021-12-23 DIAGNOSIS — R0602 Shortness of breath: Secondary | ICD-10-CM

## 2021-12-23 DIAGNOSIS — Z515 Encounter for palliative care: Secondary | ICD-10-CM

## 2021-12-23 DIAGNOSIS — R5381 Other malaise: Secondary | ICD-10-CM | POA: Diagnosis not present

## 2021-12-24 NOTE — Progress Notes (Signed)
  [facility] Consult Note Telephone: [phone]  Fax: [phone]    Date of encounter: 12/24/21 10:25 AM PATIENT NAME: Derrick Moon [address] [place] [address]   [phone] (home)  DOB: 02/16/1941 MRN: [id number] PRIMARY CARE PROVIDER:    Fagan, Roy, MD,  [address] [place] [address] [phone]  REFERRING PROVIDER:   Fagan, Roy, MD [address] [place],  [place] [address] [phone]  RESPONSIBLE PARTY:    Contact Information     Name Relation Home Work Mobile   Schlee,Kathy Spouse [phone]  [phone]      I met face to face with patient and family in home. Palliative Care was asked to follow this patient by consultation request of  Fagan, Roy, MD to address advance care planning and complex medical decision making. This is a follow up visit.                                  ASSESSMENT AND PLAN / RECOMMENDATIONS: Symptom Management/Plan: 1. Advance Care Planning;  Ongoing discussions about hospice which Derrick Moon was adamate about NOT having hospice services. We talked about DNR, most form reviewed. MOST form completed to include aggressive interventions including full scope, CPR, IVF, ABX, NO FEEDING TUBE.    2. Goals of Care: Goals include to maximize quality of life and symptom management. Our advance care planning conversation included a discussion about:    The value and importance of advance care planning  Exploration of personal, cultural or spiritual beliefs that might influence medical decisions  Exploration of goals of care in the event of a sudden injury or illness  Identification and preparation of a healthcare agent  Review and updating or creation of an advance directive document.   3. Debility secondary to deconditioning with COPD, CHF; recent hospitalization. Discussed mobility which is limited with disease progression, continue to monitor.  10/31/2021 weight  231 lbs 12/12/2021 weight 229.3 lbs BMI 31.97 4. Shortness of breath secondary to COPD, CHF with ongoing tobacco abuse disorder. Symptomatic, continue to monitor respiratory status, O2 supplemental, inhalation therapy, Continue to f/u with pulmonology.    5. Palliative care encounter; Palliative care encounter; Palliative medicine team will continue to support patient, patient's family, and medical team. Visit consisted of counseling and education dealing with the complex and emotionally intense issues of symptom management and palliative care in the setting of serious and potentially life-threatening illness  Follow up Palliative Care Visit: Palliative care will continue to follow for complex medical decision making, advance care planning, and clarification of goals. Return 4 weeks or prn by PC RN/PC SW.  I spent 61 minutes providing this consultation. More than 50% of the time in this consultation was spent in counseling and care coordination.  PPS: 40%  Chief Complaint: Follow up palliative consult for complex medical decision making, address goals, manage ongoing symptoms  HISTORY OF PRESENT ILLNESS:  Derrick Moon is a 81 y.o. year old male  with multiple medical problems including  hypertension, COPD, atrial fibrillation, enlarged prostate, and AAA, superficial bladder cancer (2010). I visited Derrick and Mrs Moon in their home, on their porch. We talked about how Derrick. Moon has been feeling, very limited with activity secondary to shortness of breath, worsening fatigue, weakness. We talked about ros, symptoms, functional abilities. We talked about shortness of breath at length with what Derrick Moon does to manage.   He wears continuous O2, though currently sitting on the porch he is not, with intermit sob with excessive talking, mobility, exertion. We talked about appetite, daily routine. We talked about hospice benefit which Derrick Moon is adamant about having hospice services. We talked about  quality of life, medical goals, role pc in poc. Discussed will schedule f/u pc visit with [facility] RN for further evaluation, discussions of goc, currently receiving therapy. Derrick and Mrs Moon in agreement, scheduled f/u RN visit also. Therapeutic listening, emotional support provided. Questions answered, contact information provided.  [name]   History obtained from review of EMR, discussion with primary team, and interview with family, facility staff/caregiver and/or Derrick. Moon.  I reviewed available labs, medications, imaging, studies and related documents from the EMR.  Records reviewed and summarized above.   ROS 10 point system reviewed all negative, except +shortness of breath, +fatigue , +weakness generalized  Physical Exam: Constitutional: NAD General: frail appearing, pleasant male EYES: lids intact ENMT: oral mucous membranes moist CV: S1S2, RRR, no LE edema Pulmonary: +fine crackles bases, + cough Abdomen: normo-active BS + 4 quadrants MSK: ambulatory Skin: warm and dry Neuro:  + generalized weakness,  no cognitive impairment Psych: non-anxious affect, A and O x 3 Thank you for the opportunity to participate in the care of Derrick. Moon.  The palliative care team will continue to follow. Please call our office at [phone] if we can be of additional assistance.   Macauley Mossberg Z Belmira Daley, NP   COVID-19 PATIENT SCREENING TOOL Asked and negative response unless otherwise noted:   Have you had symptoms of covid, tested positive or been in contact with someone with symptoms/positive test in the past 5-10 days?

## 2022-02-10 ENCOUNTER — Encounter: Payer: Self-pay | Admitting: Internal Medicine

## 2022-02-10 ENCOUNTER — Ambulatory Visit: Payer: PPO | Admitting: Internal Medicine

## 2022-02-10 VITALS — BP 138/88 | HR 85 | Temp 97.9°F | Ht 71.0 in | Wt 230.0 lb

## 2022-02-10 DIAGNOSIS — J439 Emphysema, unspecified: Secondary | ICD-10-CM

## 2022-02-10 DIAGNOSIS — J4489 Other specified chronic obstructive pulmonary disease: Secondary | ICD-10-CM

## 2022-02-10 DIAGNOSIS — J9611 Chronic respiratory failure with hypoxia: Secondary | ICD-10-CM | POA: Diagnosis not present

## 2022-02-10 DIAGNOSIS — Z79899 Other long term (current) drug therapy: Secondary | ICD-10-CM | POA: Diagnosis not present

## 2022-02-10 DIAGNOSIS — I5030 Unspecified diastolic (congestive) heart failure: Secondary | ICD-10-CM | POA: Diagnosis not present

## 2022-02-10 DIAGNOSIS — I480 Paroxysmal atrial fibrillation: Secondary | ICD-10-CM | POA: Diagnosis not present

## 2022-02-10 MED ORDER — PREDNISONE 5 MG PO TABS: 5.0000 mg | ORAL_TABLET | Freq: Every day | ORAL | 5 refills | Status: AC

## 2022-02-10 NOTE — Patient Instructions (Addendum)
Please schedule follow up scheduled with myself in 4 months.  If my schedule is not open yet, we will contact you with a reminder closer to that time. Please call [phone] if you haven't heard from [place] a month before.   Continue symbicort twice daily and xopanex inhalers as needed Continue oxygen therapy.   Take your MOST form to your appointment with Dr. Willey Blade to clarify your wishes as we discussed.   We can try prednisone 5 mg daily to see if that helps your breathing. If it does not help, ok to stop cold [place].   Call us sooner if you need [place].

## 2022-02-10 NOTE — Progress Notes (Signed)
Derrick Moon    [id number]    Feb 03, 1941  Primary Care Physician:Fagan, Carloyn Manner, MD Date of Appointment: 02/10/2022 Established Patient Visit  Chief complaint:   Chief Complaint  Patient presents with   Follow-up    Congestion, (clear) sob during exertion. Pt on 2L on oxygen at home     HPI: Derrick Moon is a 81 y.o. gentleman with CHF and COPD on home oxygen with ongoing tobacco use disorder  Interval Updates: Here for COPD follow up after 3 months. On symbicort as his request.   Continues to have poor disease control. Did not tolerated maintenance azithromycin due to lack of efficacy. Did not tolerate daliresp. Gets regularl prednisone from PCP every 6 weeks or so.   Also has atrial fibrillation and is on levalbuterol as needed - use is  He did pulmonary rehab at [facility] last year and unsure how much it helped his breathing.   He did finally establish with outpatient palliative care and saw them last in October 2023.    I have reviewed the patient's family social and past medical history and updated as appropriate.   Past Medical History:  Diagnosis Date   AAA (abdominal aortic aneurysm) (HCC)    Anxiety    due to pain in leg   Arrhythmia    Arthritis    Osteo   Bell's palsy    Cancer (HCC)    Bladder cancer   Cataracts, bilateral    CHF (congestive heart failure) (HCC)    COPD (chronic obstructive pulmonary disease) (HCC)    O2 supplementation dependent   Diverticulitis    Enlarged prostate    takes Flomax daily   GERD (gastroesophageal reflux disease)    Headache    migraines years ago   History of bronchitis    > 80yr ago   History of colon polyps    benign   History of dizziness    several yrs ago-was on Meclizine but has been off for several yrs-no problems since   History of hiatal hernia    Hypertension    takes Amlodipine and Lisinpril daily   Joint pain    Joint swelling    Nocturia    Oxygen deficiency    2 1/2 L    Oxygen dependent    2 1/2 L   PAD (peripheral artery disease) (HCC)     bilateral superficial femoral artery occlusions    Paroxysmal atrial fibrillation (HCC)    Pneumonia    several times with most recent within 5 yrs ago   Restless leg syndrome    Rupture of left quadriceps tendon    Shortness of breath dyspnea    with exertion   Sleep apnea    does not use cpap   Urinary urgency     Past Surgical History:  Procedure Laterality Date   BICEPS TENDON REPAIR     LEFT   CARDIOVERSION N/A 02/06/2017   Procedure: CARDIOVERSION;  Surgeon: SJerline Pain MD;  [facility];  Service: Cardiovascular;  Laterality: N/A;   CARPAL TUNNEL RELEASE Right 2014   CHOLECYSTECTOMY  July 16,2010   COLONOSCOPY  2003   6 polyps, left sided diverticula. Path with hyperplastic polyps per review of colonoscopy report in 2011.    COLONOSCOPY  03/2009   Scattered left sided diverticula.    COLONOSCOPY WITH PROPOFOL N/A 08/27/2020   Procedure: COLONOSCOPY WITH PROPOFOL;  Surgeon: RDaneil Dolin MD;  [facility];  Service: Endoscopy;  Laterality: N/A;  11:00am   CYSTOSCOPY     ESOPHAGOGASTRODUODENOSCOPY     ESOPHAGOGASTRODUODENOSCOPY (EGD) WITH PROPOFOL N/A 08/27/2020   Procedure: ESOPHAGOGASTRODUODENOSCOPY (EGD) WITH PROPOFOL;  Surgeon: Daneil Dolin, MD;  [facility];  Service: Endoscopy;  Laterality: N/A;   FEMORAL-POPLITEAL BYPASS GRAFT Right 09/19/2015   Procedure: RIGHT COMMON FEMORAL-ABOVE KNEE POPLITEAL ARTERY    BYPASS GRAFT ;  Surgeon: Conrad Disney, MD;  Location: [place];  Service: Vascular;  Laterality: Right;   FOOT SURGERY Right    x 5t   HAND SURGERY Right    HERNIA REPAIR Bilateral    inguinal   HOT HEMOSTASIS  08/27/2020   Procedure: HOT HEMOSTASIS (ARGON PLASMA COAGULATION/BICAP);  Surgeon: Daneil Dolin, MD;  [facility];  Service: Endoscopy;;   PERIPHERAL VASCULAR CATHETERIZATION N/A 08/02/2015   Procedure: Abdominal Aortogram w/Lower  Extremity;  Surgeon: Conrad Wilcox, MD;  Location: [place] [facility];  Service: Cardiovascular;  Laterality: N/A;   POLYPECTOMY  08/27/2020   Procedure: POLYPECTOMY INTESTINAL;  Surgeon: Daneil Dolin, MD;  [facility];  Service: Endoscopy;;   QUADRICEPS TENDON REPAIR Left 08/22/2016   Procedure: LEFT OPEN QUADRICEP TENDON REPAIR;  Surgeon: Netta Cedars, MD;  Location: [place];  Service: Orthopedics;  Laterality: Left;   REVERSE SHOULDER ARTHROPLASTY Right 07/07/2014   Procedure: RIGHT REVERSE TOTAL SHOULDER ;  Surgeon: Netta Cedars, MD;  Location: [place];  Service: Orthopedics;  Laterality: Right;   REVERSE TOTAL SHOULDER ARTHROPLASTY Right 07/07/2014   Dr Veverly Fells   ROTATOR CUFF REPAIR     Bilateral    TEE WITHOUT CARDIOVERSION N/A 02/06/2017   Procedure: TRANSESOPHAGEAL ECHOCARDIOGRAM (TEE);  Surgeon: Jerline Pain, MD;  [facility];  Service: Cardiovascular;  Laterality: N/A;   TONSILLECTOMY     tumor removed from bladder     VEIN HARVEST Right 09/19/2015   Procedure: WITH NON REVERSE RIGHT GREATER SAPHENOUS VEIN HARVEST;  Surgeon: Conrad Genoa, MD;  Location: [place];  Service: Vascular;  Laterality: Right;    Family History  Problem Relation Age of Onset   Parkinson's disease Mother    AAA (abdominal aortic aneurysm) Father    Heart defect Sister        Congenital Heart Disease   Heart disease Sister     Social History   Occupational History   Not on file  Tobacco Use   Smoking status: Some Days    Packs/day: 0.25    Years: 45.00    Total pack years: 11.25    Types: Cigars, Cigarettes    Last attempt to quit: 01/07/2019    Years since quitting: 3.0   Smokeless tobacco: Never   Tobacco comments:    smokes 1/3 of a cigar a day- 11/14/21  Vaping Use   Vaping Use: Never used  Substance and Sexual Activity   Alcohol use: Yes    Alcohol/week: 0.0 standard drinks of alcohol    Comment: wine occasionally   Drug use: Not Currently    Types: Marijuana    Sexual activity: Yes    Physical Exam: Blood pressure 138/88, pulse 85, temperature 97.9 F (36.6 C), temperature source Oral, height '5\' 11"'$  (1.803 m), weight 230 lb (104.3 kg), SpO2 96 %.   Gen: chronically ill appearing, in wheel chair, no respiratory distress, cushingoid, on nasal cannula Resp: diminished occasional rhonchi CV: diminished, irregularly irregular Ext: no edema   Data Reviewed:  Imaging: I have personally reviewed the chest xray March 2021 reveals no acute cardiopulmonary process  Echocardiogram Dec 2020  1. Left ventricular ejection fraction, by visual estimation, is 60 to  65%. The left ventricle has normal function. There is borderline left  ventricular hypertrophy.   2. Left ventricular diastolic parameters are indeterminate.   3. The left ventricle has no regional wall motion abnormalities.   4. Global right ventricle has normal systolic function.The right  ventricular size is normal. No increase in right ventricular wall  thickness.   5. Left atrial size was normal.   6. Right atrial size was normal.   7. Presence of pericardial fat pad.   8. Trivial pericardial effusion is present.   9. The mitral valve is normal in structure. Trivial mitral valve  regurgitation.  10. The tricuspid valve is normal in structure. Tricuspid valve  regurgitation is trivial.  11. The aortic valve is normal in structure. Aortic valve regurgitation is  not visualized. Mild aortic valve sclerosis without stenosis.  12. The pulmonic valve was not well visualized. Pulmonic valve  regurgitation is not visualized.  13. The atrial septum is grossly normal.   PFTs:      Latest Ref Rng & Units 03/12/2018    9:54 AM  PFT Results  FVC-Pre L 3.08   FVC-Predicted Pre % 72   FVC-Post L 3.66   FVC-Predicted Post % 85   Pre FEV1/FVC % % 59   Post FEV1/FCV % % 56   FEV1-Pre L 1.81   FEV1-Predicted Pre % 58   FEV1-Post L 2.05   DLCO uncorrected ml/min/mmHg 14.19   DLCO UNC% %  42   DLVA Predicted % 50   TLC L 7.95   TLC % Predicted % 109   RV % Predicted % 178    I have personally reviewed the patient's PFTs and my interpretation is moderately severe airflow limitation with borderline BD response consistent with COPD. FEV1 58% of predicted  Labs: A1AT within normal range Lab Results  Component Value Date   WBC 17.5 (H) 09/03/2021   HGB 14.7 09/03/2021   HCT 48.6 09/03/2021   MCV 84.8 09/03/2021   PLT 326 09/03/2021     Immunization status: Immunization History  Administered Date(s) Administered   Fluad Quad(high Dose 65+) 12/27/2019   Influenza, High Dose Seasonal PF 12/12/2015, 01/08/2017, 12/22/2017, 02/22/2019   Moderna Sars-Covid-2 Vaccination 05/06/2019, 06/03/2019   Tdap 08/15/2016    Assessment:  Moderately Severe COPD FEV1 58% with acute exacerbation and progression of disease. High symptom burden Ongoing tobacco use disorder Chronic Hypoxemic Respiratory Failure on 2.5LNC  HFpEF Atrial Fibrillation, rate controlled on eliquis Goals of Care discussion - DNR/DNI previously established.  Plan/Recommendations:  Continue symbicort twice daily and xopanex inhalers as needed Continue oxygen therapy.   Take your MOST form to your appointment with Dr. Willey Blade to clarify your wishes as we discussed. Or you can bring it to your next appt. We discussed that CPR and respiratory failure are a packaged deal - if you need CPR - you will be put on a ventilator.   You have been intolerant from all other ancillary therapies for COPD. We can try prednisone 5 mg daily to see if that helps your breathing.  If it does not help, ok to stop cold [place]. We discussed the risks/benefits of chronic prednisone therapy including cataracts, osteoporosis, DM2, HTN, worsening mood, gastric ulcers etc. Given that your priority is quality of life over quantity of life, reasonable to try this to see if it helps your symptoms of "congestion"  Call us sooner if you need [place].    I spent 40 minutes in the care of this patient today including pre-charting, chart review, review of results, face-to-face care, coordination of care and communication with consultants etc.).   Return to Care: No follow-ups on file.     Lenice Llamas, MD Pulmonary and [place]

## 2022-02-20 DIAGNOSIS — I480 Paroxysmal atrial fibrillation: Secondary | ICD-10-CM | POA: Diagnosis not present

## 2022-02-20 DIAGNOSIS — J449 Chronic obstructive pulmonary disease, unspecified: Secondary | ICD-10-CM | POA: Diagnosis not present

## 2022-02-20 DIAGNOSIS — I5030 Unspecified diastolic (congestive) heart failure: Secondary | ICD-10-CM | POA: Diagnosis not present

## 2022-03-23 DIAGNOSIS — I10 Essential (primary) hypertension: Secondary | ICD-10-CM | POA: Diagnosis not present

## 2022-03-23 DIAGNOSIS — J449 Chronic obstructive pulmonary disease, unspecified: Secondary | ICD-10-CM | POA: Diagnosis not present

## 2022-04-15 ENCOUNTER — Other Ambulatory Visit: Payer: PPO

## 2022-04-15 DIAGNOSIS — Z515 Encounter for palliative care: Secondary | ICD-10-CM

## 2022-04-15 NOTE — Progress Notes (Signed)
PATIENT NAME: Derrick Moon DOB: [birth year] MRN: [id number]  PRIMARY CARE PROVIDER: [name], MD  RESPONSIBLE PARTY:  Acct ID - Guarantor Home Phone Work Phone Relationship Acct Type  [id number] KHALIL, [phone]  Self P/F     [place], [place], [place] [address]    I connected with  Joyce Leckey Aplin on 04/15/22 by telephone and verified that I am speaking with the correct person using two identifiers.   I discussed the limitations of evaluation and management by telemedicine. The patient expressed understanding and agreed to proceed.   Insomnia:  Patient has long history of insomnia.  He is not interested in addressing this.  Patient naps throughout the day and he feels this is sufficient.   CHF:  Unable to weight himself as scale is broken. Patient is taking a diuretic when needed.  He reports chest pain recently but believes this was related to a coughing spell.  No further issues reported.   COPD:  Patient endorses improvement with breathing since the start of prednisone.  He is not taking this daily due to the side effects.  Patient believes he is taking this about 3 x a week.  Patient advised he is no longer smoking cigars.  He is considering restarting this as the weather warms up but states it takes him a week to finish one cigar.  Education offered.   Mobility: Reports 2 falls in the last couple of months.  Patient reports discomfort to his left shoulder but no other injuries.   Palliative Care Program:  Discussed Palliative Care changes and patient is now virtual as he outside of the service area.  Discussed home visits that can be done through [place] [facility].  Patient would like to continue with virtual visits through [facility].   CODE STATUS: Full ADVANCED DIRECTIVES: Not on file MOST FORM: Not on file. PPS: 40%     Lorenza Burton, RN

## 2022-04-21 ENCOUNTER — Ambulatory Visit: Payer: PPO | Admitting: Physician Assistant

## 2022-05-13 ENCOUNTER — Other Ambulatory Visit: Payer: PPO

## 2022-05-13 DIAGNOSIS — Z515 Encounter for palliative care: Secondary | ICD-10-CM

## 2022-05-13 NOTE — Progress Notes (Signed)
PATIENT NAME: Derrick Moon DOB: [birth year] MRN: [id number]  PRIMARY CARE PROVIDER: [name], MD  RESPONSIBLE PARTY:  Acct ID - Guarantor Home Phone Work Phone Relationship Acct Type  [id number] HELIX, [phone]  Self P/F     [place], [place], [place] [address]    I connected with  Tarian Bibler Merrow on 05/13/22 by telephone and verified that I am speaking with the correct person using two identifiers.   I discussed the limitations of evaluation and management by telemedicine. The patient expressed understanding and agreed to proceed.  COPD:  Patient continues with prednisone.  Some days shortness of breath is worse than others.  Using O2.  Occasionally, will go out and smoke.  Patient reports only going out 2x in the last month  Insomnia:  Patient endorses this is ongoing.  Does not wish to address.   HF:  Denies any issues with fluid retention.   No complaints of dizziness, palpations, chest pain or headaches.  HTN:  Continues to check bp readings 1-2x daily.  Will be following up with Dr. Willey Blade regarding readings.  Patient endorses evenings tend to run higher sometimes 170/100.  He is compliant with all medications.  Mobility:  Using a rolling walker.  No recent falls reported.  More fatigued with ambulation.   Overall patient feels he has been the same since our phone conversation last month.  No changes with medications.   CODE STATUS: DNR ADVANCED DIRECTIVES: Not on file MOST FORM: Yes PPS: 40%         Lorenza Burton, RN

## 2022-05-16 DIAGNOSIS — Z79899 Other long term (current) drug therapy: Secondary | ICD-10-CM | POA: Diagnosis not present

## 2022-05-16 DIAGNOSIS — I5030 Unspecified diastolic (congestive) heart failure: Secondary | ICD-10-CM | POA: Diagnosis not present

## 2022-05-16 DIAGNOSIS — J9611 Chronic respiratory failure with hypoxia: Secondary | ICD-10-CM | POA: Diagnosis not present

## 2022-05-16 DIAGNOSIS — J449 Chronic obstructive pulmonary disease, unspecified: Secondary | ICD-10-CM | POA: Diagnosis not present

## 2022-05-23 DIAGNOSIS — C4441 Basal cell carcinoma of skin of scalp and neck: Secondary | ICD-10-CM | POA: Diagnosis not present

## 2022-05-23 DIAGNOSIS — J9611 Chronic respiratory failure with hypoxia: Secondary | ICD-10-CM | POA: Diagnosis not present

## 2022-05-23 DIAGNOSIS — I480 Paroxysmal atrial fibrillation: Secondary | ICD-10-CM | POA: Diagnosis not present

## 2022-06-03 ENCOUNTER — Other Ambulatory Visit: Payer: PPO

## 2022-06-03 DIAGNOSIS — Z515 Encounter for palliative care: Secondary | ICD-10-CM

## 2022-06-03 NOTE — Progress Notes (Signed)
PATIENT NAME: Derrick Moon DOB: 1940-11-22 MRN: [id number]  PRIMARY CARE PROVIDER: [name], MD  RESPONSIBLE PARTY:  Acct ID - Guarantor Home Phone Work Phone Relationship Acct Type  [id number] BASSAM, [phone]  Self P/F     [place], [place], [place] [address]   I connected with  [name] on 06/03/22 by telephone and verified that I am speaking with the correct person using two identifiers.   I discussed the limitations of evaluation and management by telemedicine. The patient expressed understanding and agreed to proceed.  Anxiety:  Recently changed to klonopin from ativan.  Noted increased sedation with one dose.  Patient advised he will likely take 1/2 a dose when needed.   Feels anxiety is managed well at this time.   Dental:  Patient is currently working on paperwork to submit to his dentist.  Needs 10 extractions completed and then will be fitted for dentures.  Patient advised he will need an letter from PCP regarding Eliquis.   Dermatology:  Patient advises he will also need to have areas of concern on check and head assessed by dermatology.  He notes a history of basal cell carcinoma.  HF:  No new concerns voiced today.  Patient advised nothing more can be done for his condition and he will likely not follow up with cardiology.  Patient advised his PCP is able to manage his condition at this time.   Follow up scheduled for next month.     CODE STATUS: Full ADVANCED DIRECTIVES: No MOST FORM: No PPS: 40%         Lorenza Burton, RN

## 2022-06-24 DIAGNOSIS — D485 Neoplasm of uncertain behavior of skin: Secondary | ICD-10-CM | POA: Diagnosis not present

## 2022-06-24 DIAGNOSIS — L570 Actinic keratosis: Secondary | ICD-10-CM | POA: Diagnosis not present

## 2022-06-24 DIAGNOSIS — L821 Other seborrheic keratosis: Secondary | ICD-10-CM | POA: Diagnosis not present

## 2022-06-24 DIAGNOSIS — C4442 Squamous cell carcinoma of skin of scalp and neck: Secondary | ICD-10-CM | POA: Diagnosis not present

## 2022-06-24 DIAGNOSIS — Z85828 Personal history of other malignant neoplasm of skin: Secondary | ICD-10-CM | POA: Diagnosis not present

## 2022-06-24 DIAGNOSIS — C44329 Squamous cell carcinoma of skin of other parts of face: Secondary | ICD-10-CM | POA: Diagnosis not present

## 2022-07-08 ENCOUNTER — Other Ambulatory Visit: Payer: PPO

## 2022-07-08 DIAGNOSIS — Z515 Encounter for palliative care: Secondary | ICD-10-CM

## 2022-07-08 NOTE — Progress Notes (Signed)
PATIENT NAME: QUENTON RECENDEZ DOB: [birth year] MRN: [id number]  PRIMARY CARE PROVIDER: Carylon Perches, MD  RESPONSIBLE PARTY:  Acct ID - Guarantor Home Phone Work Phone Relationship Acct Type  [id number] OWEN, PRATTE* [phone]  Self P/F     [address], [place], [place] [address]    I connected with  Mister Krahenbuhl Valera on 07/08/22 by telephone and verified that I am speaking with the correct person using two identifiers.   I discussed the limitations of evaluation and management by telemedicine. The patient expressed understanding and agreed to proceed.   Anxiety:  Taking Clonazepam 1/2 tablet to manage anxiety.  Only taking when anxiety is high and patient reports this is effective.   COPD:  Continues on prednisone and feels this is helpful.  Becomes short of breath easily during this conversation.  Grunting noted throughout conversation.  Patient reports breathing has been a little worse this week due to pollen.  He was outside for a couple of days last week but is staying inside until pollen improves.  Heart Failure:  Denies any chest pain, dizziness, palpations or headaches.  Edema present to lower extremities but patient does not believe there has been a change in this.   Mobility:  Patient endorses weakness to her left lower extremity.  He reports "falling" into the bathtub at the beginning of the month.  Patient he struggled for about 2 hours before he was about to get out of the bath tub.   Currently using a rolator walker.  No other falls reported.   Skin Lesions: Recently seen by dermatology to have several lesions removed.  Patient reports areas are healing and he has a follow up visit in 3 months.    Follow up scheduled by telephone in 1 month.      Truitt Merle, RN

## 2022-07-12 IMAGING — CT CT ABD-PELV W/ CM
2 of 5 series · 16 of 46 positions shown, 18 images · IV contrast (Omnipaque or Isovue)
Comparison: Abdominal ultrasound 02/10/2019. CT abdomen and pelvis
10/10/2009.

CLINICAL DATA: Left lower quadrant pain for 1 week. Remote history
of bladder cancer.

EXAM:
CT ABDOMEN AND PELVIS WITH CONTRAST
TECHNIQUE: Multidetector CT imaging of the abdomen and pelvis was performed
using the standard protocol following bolus administration of
intravenous contrast.
CONTRAST:  100mL OMNIPAQUE IOHEXOL 300 MG/ML  SOLN

[Series 2: axial st · axial · 0.90mm/px · z∈[+721,+1101]mm · 13 of 86 slices shown, 15 images]
[im 5/86  soft-tissue]
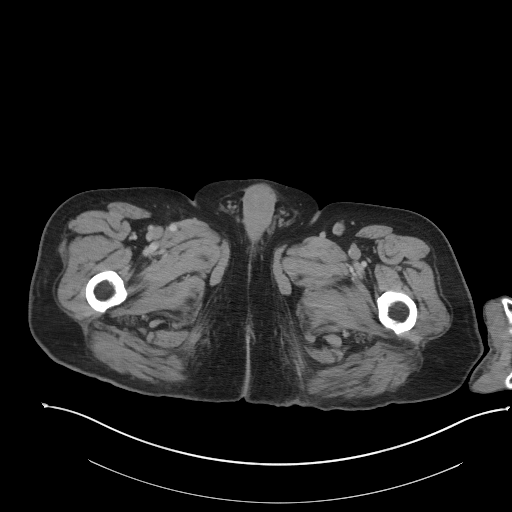
[im 5/86  bone]
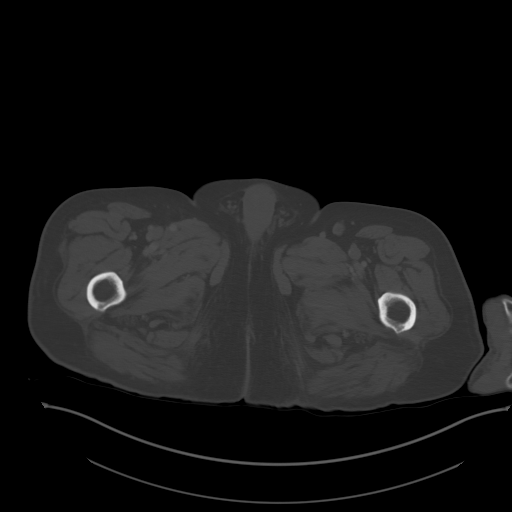
[im 10/86  soft-tissue]
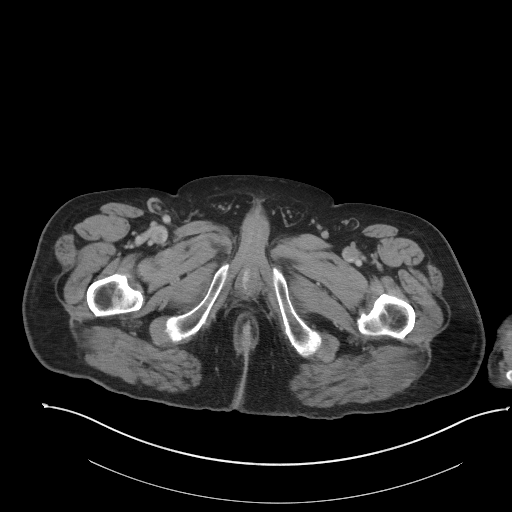
[im 19/86  soft-tissue]
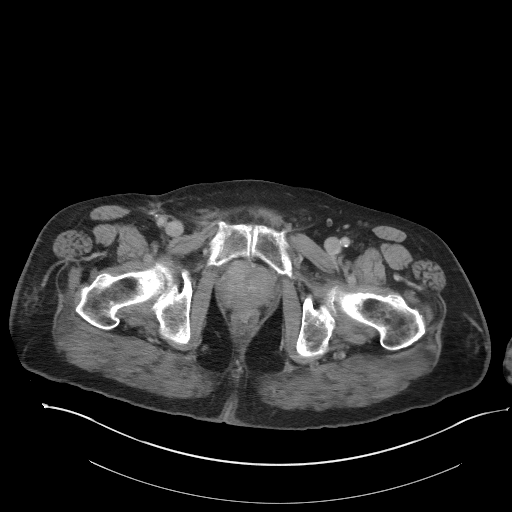
[im 24/86  soft-tissue]
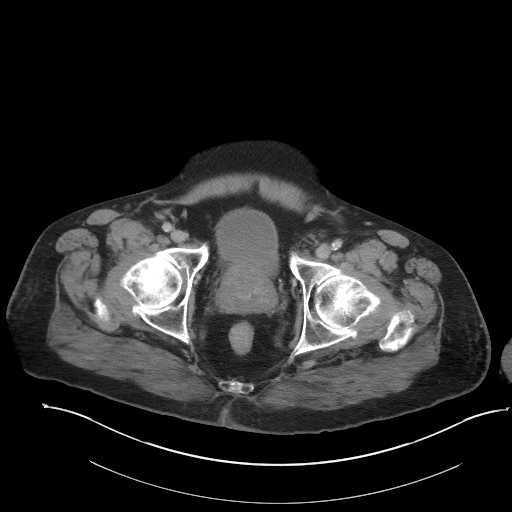
[im 29/86  soft-tissue]
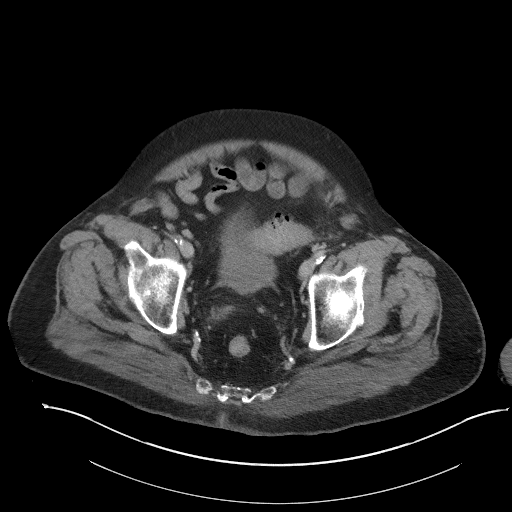
[im 38/86  soft-tissue]
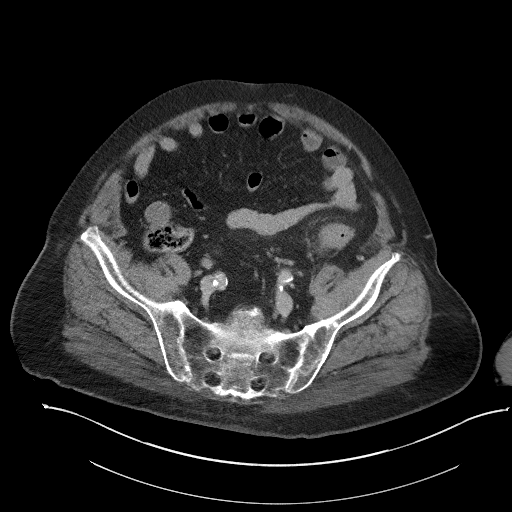
[im 43/86  soft-tissue]
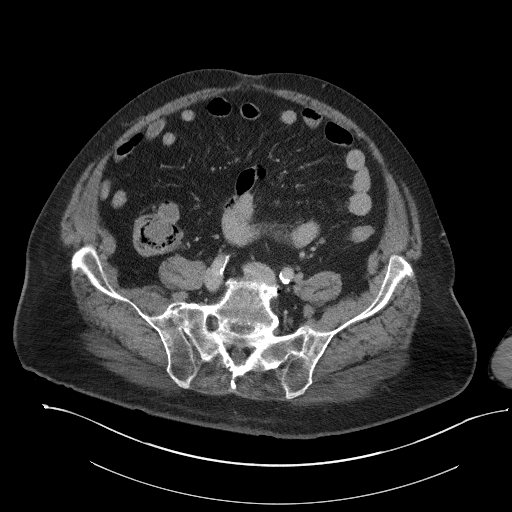
[im 48/86  soft-tissue]
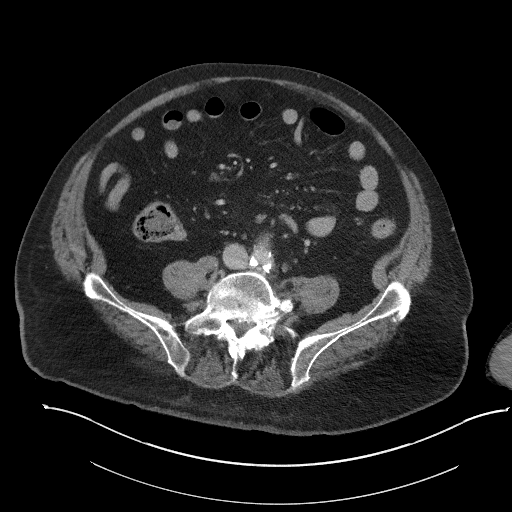
[im 57/86  soft-tissue]
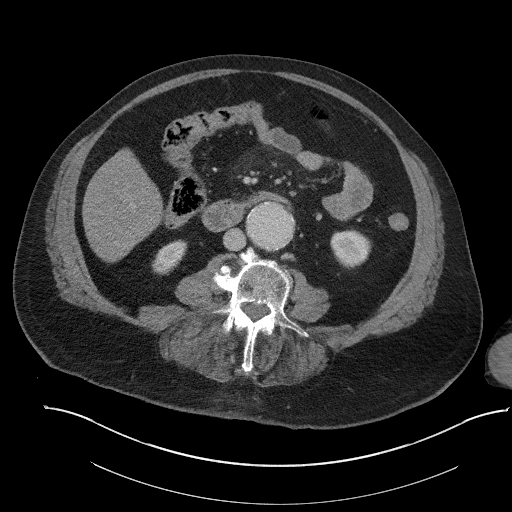
[im 57/86  bone]
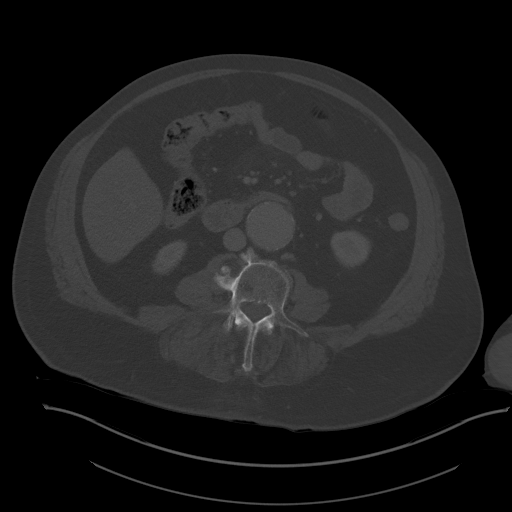
[im 62/86  soft-tissue]
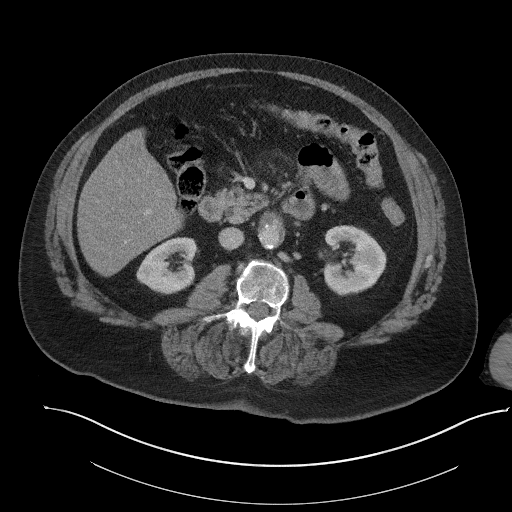
[im 67/86  soft-tissue]
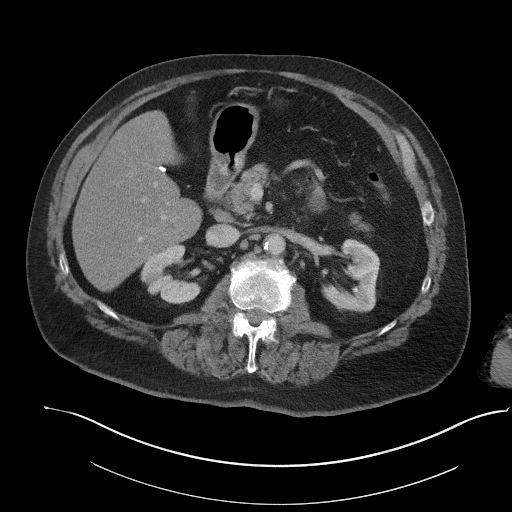
[im 76/86  soft-tissue]
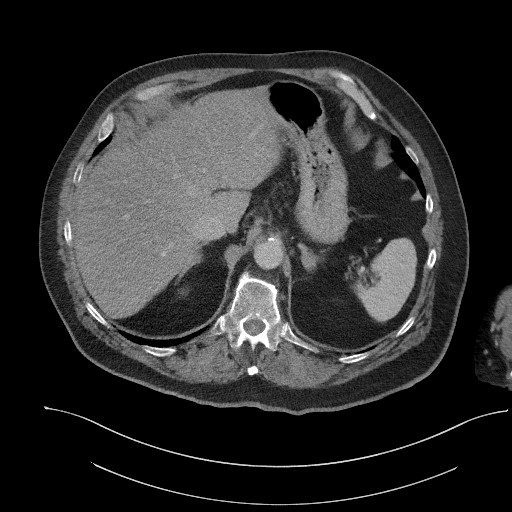
[im 81/86  soft-tissue]
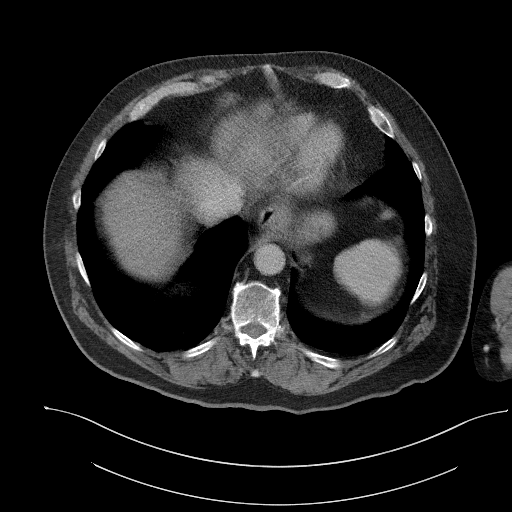

[Series 5: coronal st · coronal · 0.86mm/px · 3 of 130 slices shown]
[im 44/130  soft-tissue]
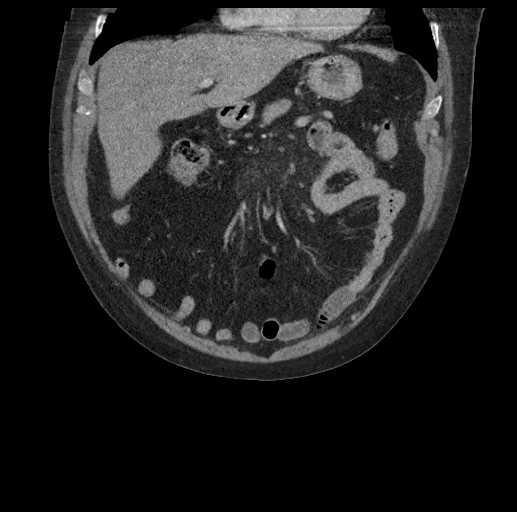
[im 58/130  soft-tissue]
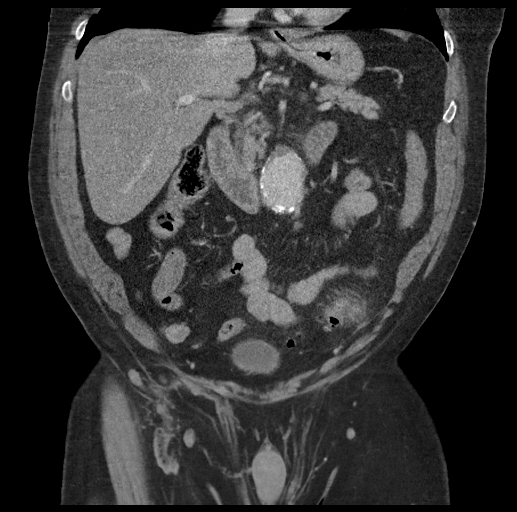
[im 72/130  soft-tissue]
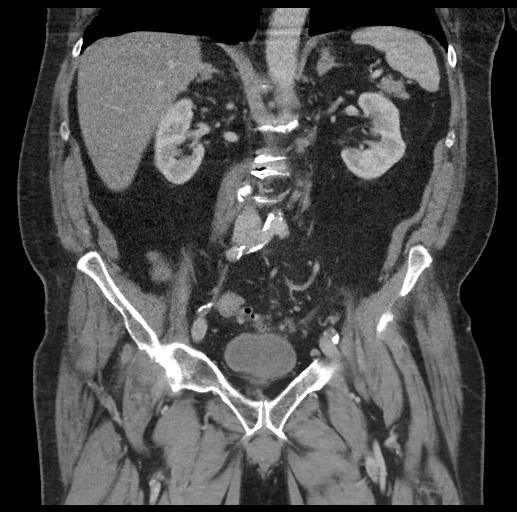

[16 of 46 positions shown; findings below may reference images not displayed]

FINDINGS: Lower chest: Mild atelectasis or scarring in the right greater than
left lower lobes. No pleural effusion.

Hepatobiliary: Hepatic steatosis. Status post cholecystectomy. No
biliary dilatation.

Pancreas: Unremarkable.

Spleen: Unremarkable.

Adrenals/Urinary Tract: Unremarkable adrenal glands. Prominent right
upper pole renal scarring. 3 mm left renal stone. No hydronephrosis
or renal mass. Unremarkable bladder.

Stomach/Bowel: The stomach is unremarkable. There is no evidence of
bowel obstruction. There is diverticulosis of the sigmoid colon with
moderately extensive sigmoid colon wall thickening and mild
surrounding inflammatory stranding. No extraluminal gas or fluid
collection is identified. The appendix is unremarkable.

Vascular/Lymphatic: Aortic atherosclerosis with 4.7 cm diameter
infrarenal abdominal aortic aneurysm, larger than on the 8888
ultrasound. No enlarged lymph nodes.

Reproductive: Enlarged prostate mildly indenting the bladder base.

Other: No intraperitoneal free fluid. Small fat containing umbilical
hernia.

Musculoskeletal: No acute osseous abnormality or suspicious osseous
lesion. Moderate lumbar disc and facet degeneration. Bilateral
sacroiliac joint ankylosis.
IMPRESSION: 1. Acute, uncomplicated sigmoid colon diverticulitis.
2. Hepatic steatosis.
3. Nonobstructing left nephrolithiasis.
4. 4.7 cm infrarenal abdominal aortic aneurysm. Recommend follow-up
CT/MR every 6 months and vascular consultation. This recommendation
follows ACR consensus guidelines: White Paper of the ACR Incidental
Findings Committee II on Vascular Findings. [HOSPITAL] 5784;
[DATE].

Aortic Atherosclerosis (NO7L4-4IX.X).

Aortic aneurysm NOS (NO7L4-FFA.F).

## 2022-07-18 ENCOUNTER — Telehealth: Payer: Self-pay | Admitting: Internal Medicine

## 2022-07-18 ENCOUNTER — Other Ambulatory Visit (HOSPITAL_COMMUNITY): Payer: Self-pay

## 2022-07-18 ENCOUNTER — Telehealth: Payer: Self-pay

## 2022-07-18 NOTE — Telephone Encounter (Signed)
PA has been submitted and will be updated in additional encounter created.  

## 2022-07-18 NOTE — Telephone Encounter (Signed)
Patient is calling to get a Prior Auth for his Symbicort.  Please advise and call to discuss further at [phone]

## 2022-07-18 NOTE — Telephone Encounter (Signed)
PA request received via CMM for Symbicort 160-4.5MCG/ACT aerosol  PA has been submitted to [employer] and is pending determination  Key: [id number]

## 2022-07-21 NOTE — Telephone Encounter (Signed)
PA has been APPROVED from 07/18/2022-03/24/2023

## 2022-08-07 ENCOUNTER — Other Ambulatory Visit: Payer: PPO

## 2022-08-07 DIAGNOSIS — Z515 Encounter for palliative care: Secondary | ICD-10-CM

## 2022-08-08 NOTE — Progress Notes (Signed)
COMMUNITY PALLIATIVE CARE SW NOTE  PATIENT NAME: Derrick Moon DOB: [birth year] MRN: [id number]  PRIMARY CARE PROVIDER: Carylon Perches, MD  RESPONSIBLE PARTY:  Acct ID - Guarantor Home Phone Work Phone Relationship Acct Type  [id number] ALIJHA, RUMMEL* [phone]  Self P/F     [address], [place], [place] [address]                                Palliative Care Visit/Clinical Social Work   [employer] SW completed a telephonic encounter with patient and his wife to assess patient status, needs and comfort. Patient reported that he was not breathing that great today. He did take Clonazepam for anxiety, and which usually helps his breathing. He stated that he is also resting and he may take a Prednisone if difficulty continues. He denied having any pain. Patient has some edema today, but is not as bad as it has been.   SW talked with both patient and his wife regarding hospice, the services provided and benefits of the service. His wife verbalized understanding, but stated patient was not ready for this yet. SW also reiterated that patient's have benefited more from the services/program with early involvement. Again, she verbalized understanding of education provided, but did not want to move forward with services.   No psychosocial  issues, needs or concerns noted. SW encouraged the wife to call with any questions or concerns. SW advised her that the palliative care nurse will follow-up in the next 2-4 weeks.     Social History   Tobacco Use   Smoking status: Some Days    Packs/day: 0.25    Years: 45.00    Additional pack years: 0.00    Total pack years: 11.25    Types: Cigars, Cigarettes    Last attempt to quit: 01/07/2019    Years since quitting: 3.5   Smokeless tobacco: Never   Tobacco comments:    smokes 1/3 of a cigar a day- 11/14/21  Substance Use Topics   Alcohol use: Yes    Alcohol/week: 0.0 standard drinks of alcohol    Comment: wine occasionally    CODE STATUS: Full  Code ADVANCED DIRECTIVES: No MOST FORM COMPLETE:  No HOSPICE EDUCATION PROVIDED: Yes  Duration of encounter and documentation: 30 minutes  [employer], LCSW

## 2022-08-19 DIAGNOSIS — I480 Paroxysmal atrial fibrillation: Secondary | ICD-10-CM | POA: Diagnosis not present

## 2022-08-19 DIAGNOSIS — J9611 Chronic respiratory failure with hypoxia: Secondary | ICD-10-CM | POA: Diagnosis not present

## 2022-08-19 DIAGNOSIS — E1129 Type 2 diabetes mellitus with other diabetic kidney complication: Secondary | ICD-10-CM | POA: Diagnosis not present

## 2022-08-19 DIAGNOSIS — Z79899 Other long term (current) drug therapy: Secondary | ICD-10-CM | POA: Diagnosis not present

## 2022-08-25 DIAGNOSIS — R739 Hyperglycemia, unspecified: Secondary | ICD-10-CM | POA: Diagnosis not present

## 2022-08-25 DIAGNOSIS — E1129 Type 2 diabetes mellitus with other diabetic kidney complication: Secondary | ICD-10-CM | POA: Diagnosis not present

## 2022-08-25 DIAGNOSIS — J449 Chronic obstructive pulmonary disease, unspecified: Secondary | ICD-10-CM | POA: Diagnosis not present

## 2022-08-25 DIAGNOSIS — I480 Paroxysmal atrial fibrillation: Secondary | ICD-10-CM | POA: Diagnosis not present

## 2022-11-19 DIAGNOSIS — J9611 Chronic respiratory failure with hypoxia: Secondary | ICD-10-CM | POA: Diagnosis not present

## 2022-11-19 DIAGNOSIS — Z79899 Other long term (current) drug therapy: Secondary | ICD-10-CM | POA: Diagnosis not present

## 2022-11-19 DIAGNOSIS — I5030 Unspecified diastolic (congestive) heart failure: Secondary | ICD-10-CM | POA: Diagnosis not present

## 2022-11-19 DIAGNOSIS — J449 Chronic obstructive pulmonary disease, unspecified: Secondary | ICD-10-CM | POA: Diagnosis not present

## 2022-11-19 DIAGNOSIS — I480 Paroxysmal atrial fibrillation: Secondary | ICD-10-CM | POA: Diagnosis not present

## 2022-11-19 DIAGNOSIS — E1129 Type 2 diabetes mellitus with other diabetic kidney complication: Secondary | ICD-10-CM | POA: Diagnosis not present

## 2022-11-26 ENCOUNTER — Telehealth: Payer: Self-pay | Admitting: Internal Medicine

## 2022-11-27 NOTE — Telephone Encounter (Signed)
Needs refill before the weekend for symbicort, Pls call once sent in  Pharmacy: [facility] in [place]

## 2022-11-27 NOTE — Telephone Encounter (Signed)
Pts wife notified no future refills until seen.

## 2023-01-06 DIAGNOSIS — E1169 Type 2 diabetes mellitus with other specified complication: Secondary | ICD-10-CM | POA: Diagnosis not present

## 2023-01-06 DIAGNOSIS — I5032 Chronic diastolic (congestive) heart failure: Secondary | ICD-10-CM | POA: Diagnosis not present

## 2023-01-06 DIAGNOSIS — Z23 Encounter for immunization: Secondary | ICD-10-CM | POA: Diagnosis not present

## 2023-01-06 DIAGNOSIS — I480 Paroxysmal atrial fibrillation: Secondary | ICD-10-CM | POA: Diagnosis not present

## 2023-01-06 DIAGNOSIS — J440 Chronic obstructive pulmonary disease with acute lower respiratory infection: Secondary | ICD-10-CM | POA: Diagnosis not present

## 2023-05-12 DIAGNOSIS — I482 Chronic atrial fibrillation, unspecified: Secondary | ICD-10-CM | POA: Diagnosis not present

## 2023-05-12 DIAGNOSIS — E118 Type 2 diabetes mellitus with unspecified complications: Secondary | ICD-10-CM | POA: Diagnosis not present

## 2023-05-12 DIAGNOSIS — Z79899 Other long term (current) drug therapy: Secondary | ICD-10-CM | POA: Diagnosis not present

## 2023-05-12 DIAGNOSIS — J449 Chronic obstructive pulmonary disease, unspecified: Secondary | ICD-10-CM | POA: Diagnosis not present

## 2023-05-12 DIAGNOSIS — I5032 Chronic diastolic (congestive) heart failure: Secondary | ICD-10-CM | POA: Diagnosis not present

## 2023-05-25 DIAGNOSIS — I5032 Chronic diastolic (congestive) heart failure: Secondary | ICD-10-CM | POA: Diagnosis not present

## 2023-05-25 DIAGNOSIS — J9611 Chronic respiratory failure with hypoxia: Secondary | ICD-10-CM | POA: Diagnosis not present

## 2023-05-25 DIAGNOSIS — I10 Essential (primary) hypertension: Secondary | ICD-10-CM | POA: Diagnosis not present

## 2023-05-25 DIAGNOSIS — J440 Chronic obstructive pulmonary disease with acute lower respiratory infection: Secondary | ICD-10-CM | POA: Diagnosis not present

## 2023-05-25 DIAGNOSIS — I482 Chronic atrial fibrillation, unspecified: Secondary | ICD-10-CM | POA: Diagnosis not present

## 2023-05-25 DIAGNOSIS — E1169 Type 2 diabetes mellitus with other specified complication: Secondary | ICD-10-CM | POA: Diagnosis not present

## 2023-05-31 ENCOUNTER — Encounter: Payer: Self-pay | Admitting: Internal Medicine

## 2023-09-22 DEATH — deceased
